# Patient Record
Sex: Male | Born: 1945 | Race: Black or African American | Hispanic: No | Smoking: Former smoker
Health system: Southern US, Community
[De-identification: ages and names within clinical notes are randomized; demographics above are authoritative.]

## PROBLEM LIST (undated history)

## (undated) DIAGNOSIS — E785 Hyperlipidemia, unspecified: Secondary | ICD-10-CM

## (undated) DIAGNOSIS — M1990 Unspecified osteoarthritis, unspecified site: Secondary | ICD-10-CM

## (undated) DIAGNOSIS — I10 Essential (primary) hypertension: Secondary | ICD-10-CM

## (undated) DIAGNOSIS — E1169 Type 2 diabetes mellitus with other specified complication: Secondary | ICD-10-CM

## (undated) DIAGNOSIS — E119 Type 2 diabetes mellitus without complications: Secondary | ICD-10-CM

## (undated) HISTORY — DX: Hyperlipidemia, unspecified: E78.5

## (undated) HISTORY — DX: Unspecified osteoarthritis, unspecified site: M19.90

## (undated) HISTORY — PX: CYST REMOVAL TRUNK: SHX6283

## (undated) HISTORY — PX: APPENDECTOMY: SHX54

## (undated) HISTORY — DX: Essential (primary) hypertension: I10

## (undated) HISTORY — DX: Type 2 diabetes mellitus without complications: E11.9

## (undated) HISTORY — DX: Type 2 diabetes mellitus with other specified complication: E11.69

---

## 2007-04-27 HISTORY — PX: SHOULDER SURGERY: SHX246

## 2010-07-19 ENCOUNTER — Emergency Department: Payer: Self-pay | Admitting: Emergency Medicine

## 2012-08-01 ENCOUNTER — Emergency Department: Payer: Self-pay | Admitting: Emergency Medicine

## 2013-05-11 ENCOUNTER — Emergency Department: Payer: Self-pay | Admitting: Emergency Medicine

## 2015-04-01 ENCOUNTER — Ambulatory Visit (INDEPENDENT_AMBULATORY_CARE_PROVIDER_SITE_OTHER): Payer: Medicare Other | Admitting: Family Medicine

## 2015-04-01 ENCOUNTER — Encounter: Payer: Self-pay | Admitting: Family Medicine

## 2015-04-01 VITALS — BP 138/63 | HR 87 | Temp 98.5°F | Resp 16 | Ht 68.0 in | Wt 197.7 lb

## 2015-04-01 DIAGNOSIS — R058 Other specified cough: Secondary | ICD-10-CM | POA: Insufficient documentation

## 2015-04-01 DIAGNOSIS — Z23 Encounter for immunization: Secondary | ICD-10-CM | POA: Diagnosis not present

## 2015-04-01 DIAGNOSIS — E1165 Type 2 diabetes mellitus with hyperglycemia: Secondary | ICD-10-CM | POA: Insufficient documentation

## 2015-04-01 DIAGNOSIS — T464X5A Adverse effect of angiotensin-converting-enzyme inhibitors, initial encounter: Secondary | ICD-10-CM

## 2015-04-01 DIAGNOSIS — IMO0002 Reserved for concepts with insufficient information to code with codable children: Secondary | ICD-10-CM | POA: Insufficient documentation

## 2015-04-01 DIAGNOSIS — E1142 Type 2 diabetes mellitus with diabetic polyneuropathy: Secondary | ICD-10-CM

## 2015-04-01 DIAGNOSIS — I10 Essential (primary) hypertension: Secondary | ICD-10-CM | POA: Insufficient documentation

## 2015-04-01 DIAGNOSIS — Z794 Long term (current) use of insulin: Secondary | ICD-10-CM

## 2015-04-01 DIAGNOSIS — E119 Type 2 diabetes mellitus without complications: Secondary | ICD-10-CM

## 2015-04-01 DIAGNOSIS — R05 Cough: Secondary | ICD-10-CM | POA: Diagnosis not present

## 2015-04-01 DIAGNOSIS — E118 Type 2 diabetes mellitus with unspecified complications: Secondary | ICD-10-CM | POA: Insufficient documentation

## 2015-04-01 LAB — GLUCOSE, POCT (MANUAL RESULT ENTRY): POC Glucose: 172 mg/dl — AB (ref 70–99)

## 2015-04-01 LAB — POCT GLYCOSYLATED HEMOGLOBIN (HGB A1C): Hemoglobin A1C: 6.3

## 2015-04-01 MED ORDER — LYRICA 150 MG PO CAPS: 150.0000 mg | ORAL_CAPSULE | Freq: Every morning | ORAL | Status: DC

## 2015-04-01 MED ORDER — LOSARTAN POTASSIUM 50 MG PO TABS: 50.0000 mg | ORAL_TABLET | Freq: Every day | ORAL | Status: DC

## 2015-04-01 MED ORDER — METOPROLOL TARTRATE 25 MG PO TABS
25.0000 mg | ORAL_TABLET | Freq: Two times a day (BID) | ORAL | Status: DC
Start: 2015-04-01 — End: 2015-07-26

## 2015-04-01 MED ORDER — TRIAMTERENE-HCTZ 37.5-25 MG PO TABS: 0.5000 | ORAL_TABLET | Freq: Every day | ORAL | Status: DC

## 2015-04-01 NOTE — Progress Notes (Signed)
Name: William Parks   MRN: [id number]    DOB: 11/27/1945   Date:04/01/2015       Progress Note  Subjective  Chief Complaint  Chief Complaint  Patient presents with  . Establish Care    NP  . Diabetes  . Hypertension    Diabetes He presents for his follow-up diabetic visit. He has type 2 diabetes mellitus. His disease course has been stable. There are no hypoglycemic associated symptoms. Pertinent negatives for hypoglycemia include no headaches. There are no diabetic associated symptoms. Pertinent negatives for diabetes include no chest pain, no fatigue, no polydipsia and no polyuria. Symptoms are stable. Diabetic complications include peripheral neuropathy (Takes Lyrica 150mg  twice daily.). Pertinent negatives for diabetic complications include no CVA or heart disease. Risk factors for coronary artery disease include diabetes mellitus. Current diabetic treatment includes intensive insulin program (Lantus 30 units BID, Humalog 10 units with meals.). He is currently taking insulin pre-breakfast, pre-lunch, pre-dinner and at bedtime. Insulin injections are given by patient. Rotation sites for injection include the abdominal wall. His weight is stable. He is following a diabetic diet. He participates in exercise daily. His breakfast blood glucose range is generally 110-130 mg/dl. An ACE inhibitor/angiotensin II receptor blocker is being taken. He does not see a podiatrist.Eye exam is not current.  Hypertension This is a chronic problem. The problem is unchanged. The problem is controlled. Pertinent negatives include no chest pain, headaches, palpitations or shortness of breath. Past treatments include beta blockers, ACE inhibitors and diuretics. The current treatment provides significant improvement. Compliance problems include medication side effects (reports having cough with lisinopril).  There is no history of kidney disease, CAD/MI or CVA.    Past Medical History  Diagnosis Date  . Diabetes  mellitus without complication (HCC)   . Hypertension   . Arthritis     rt shoulder    Past Surgical History  Procedure Laterality Date  . Cyst removal trunk    . Shoulder surgery Right     Family History  Problem Relation Age of Onset  . Cancer Mother     Breast CA    Social History   Social History  . Marital Status: Single    Spouse Name: N/A  . Number of Children: N/A  . Years of Education: N/A   Occupational History  . Not on file.   Social History Main Topics  . Smoking status: Heavy Tobacco Smoker    Types: Cigarettes  . Smokeless tobacco: Never Used  . Alcohol Use: No  . Drug Use: No  . Sexual Activity: No   Other Topics Concern  . Not on file   Social History Narrative  . No narrative on file     Current outpatient prescriptions:  .  aspirin 81 MG EC tablet, Take 81 mg by mouth daily., Disp: , Rfl: 5 .  [facility] VITAMIN D3 1000 UNITS capsule, Take 1,000 Units by mouth daily., Disp: , Rfl: 5 .  HUMALOG KWIKPEN 100 UNIT/ML KiwkPen, INJECT 10 UNITS SUBCUTANEOUSLY 3 TIMES ADAY, Disp: , Rfl: 5 .  LANTUS SOLOSTAR 100 UNIT/ML Solostar Pen, INJECT 30 UNIT(S) SUBCUTANEOUSLY TWICE A DAY, Disp: , Rfl: 0 .  lisinopril (PRINIVIL,ZESTRIL) 10 MG tablet, Take 1 tablet by mouth daily., Disp: , Rfl: 5 .  LYRICA 150 MG capsule, Take 150 mg by mouth every morning., Disp: , Rfl: 0 .  metoprolol tartrate (LOPRESSOR) 25 MG tablet, Take 25 mg by mouth 2 (two) times daily., Disp: , Rfl:  .  ranitidine (ZANTAC) 75 MG tablet, Take 75 mg by mouth 2 (two) times daily., Disp: , Rfl:  .  triamterene-hydrochlorothiazide (MAXZIDE-25) 37.5-25 MG tablet, Take 0.5 tablets by mouth daily., Disp: , Rfl: 5  No Known Allergies   Review of Systems  Constitutional: Negative for fatigue.  Respiratory: Negative for shortness of breath.   Cardiovascular: Negative for chest pain and palpitations.  Neurological: Negative for headaches.  Endo/Heme/Allergies: Negative for polydipsia.     Objective  Filed Vitals:   04/01/15 1132  BP: 138/63  Pulse: 87  Temp: 98.5 F (36.9 C)  TempSrc: Oral  Resp: 16  Height:  (1.727 m)  Weight: 197 lb 11.2 oz (89.676 kg)  SpO2: 96%    Physical Exam  Constitutional: He is oriented to person, place, and time and well-developed, well-nourished, and in no distress.  HENT:  Head: Normocephalic and atraumatic.  Eyes: Pupils are equal, round, and reactive to light.  Cardiovascular: Normal rate, regular rhythm and normal heart sounds.   Pulmonary/Chest: Effort normal and breath sounds normal. He has no wheezes.  Abdominal: Soft. Bowel sounds are normal. There is no tenderness.  Musculoskeletal: He exhibits no edema.  Neurological: He is alert and oriented to person, place, and time.  Skin: Skin is warm and dry.  Psychiatric: Mood, memory, affect and judgment normal.  Nursing note and vitals reviewed.      Recent Results (from the past 2160 hour(s))  POCT Glucose (CBG)     Status: Abnormal   Collection Time: 04/01/15 11:51 AM  Result Value Ref Range   POC Glucose 172 (A) 70 - 99 mg/dl  POCT HgB W1U     Status: Normal   Collection Time: 04/01/15 12:14 PM  Result Value Ref Range   Hemoglobin A1C 6.3      Assessment & Plan  1. Need for immunization against influenza  - Flu vaccine HIGH DOSE PF (Fluzone High dose)  2. Essential hypertension Replace lisinopril with losartan 50 mg daily. - losartan (COZAAR) 50 MG tablet; Take 1 tablet (50 mg total) by mouth daily.  Dispense: 30 tablet; Refill: 0 - metoprolol tartrate (LOPRESSOR) 25 MG tablet; Take 1 tablet (25 mg total) by mouth 2 (two) times daily.  Dispense: 180 tablet; Refill: 0 - triamterene-hydrochlorothiazide (MAXZIDE-25) 37.5-25 MG tablet; Take 0.5 tablets by mouth daily.  Dispense: 90 tablet; Refill: 0  3. Insulin dependent type 2 diabetes mellitus, controlled (HCC) A1c of 6.3%, controlled. Continue on basal and mealtime insulin. Request records from  previous PCP. Referral to ophthalmology. - POCT HgB A1C - POCT Glucose (CBG) - Lipid Profile - Comprehensive Metabolic Panel (CMET) - Ambulatory referral to Ophthalmology  4. Cough due to ACE inhibitor Replace lisinopril with losartan and follow-up in one month. - losartan (COZAAR) 50 MG tablet; Take 1 tablet (50 mg total) by mouth daily.  Dispense: 30 tablet; Refill: 0  5. Diabetic peripheral neuropathy associated with type 2 diabetes mellitus (HCC) Stable. Continue present medication. - LYRICA 150 MG capsule; Take 1 capsule (150 mg total) by mouth every morning.  Dispense: 90 capsule; Refill: 0    Atavia Poppe [facility]ad [facility] 04/01/2015 12:14 PM

## 2015-04-02 LAB — LIPID PANEL
Chol/HDL Ratio: 3.6 ratio units (ref 0.0–5.0)
Cholesterol, Total: 190 mg/dL (ref 100–199)
HDL: 53 mg/dL (ref >39)
LDL Calculated: 115 mg/dL — ABNORMAL HIGH (ref 0–99)
Triglycerides: 111 mg/dL (ref 0–149)
VLDL Cholesterol Cal: 22 mg/dL (ref 5–40)

## 2015-04-02 LAB — COMPREHENSIVE METABOLIC PANEL
ALT: 19 IU/L (ref 0–44)
AST: 21 IU/L (ref 0–40)
Albumin/Globulin Ratio: 1.3 (ref 1.1–2.5)
Albumin: 4.3 g/dL (ref 3.5–4.8)
Alkaline Phosphatase: 73 IU/L (ref 39–117)
BUN/Creatinine Ratio: 15 (ref 10–22)
BUN: 16 mg/dL (ref 8–27)
Bilirubin Total: 0.6 mg/dL (ref 0.0–1.2)
CO2: 28 mmol/L (ref 18–29)
Calcium: 9.7 mg/dL (ref 8.6–10.2)
Chloride: 97 mmol/L (ref 97–106)
Creatinine, Ser: 1.09 mg/dL (ref 0.76–1.27)
GFR calc Af Amer: 79 mL/min/{1.73_m2} (ref >59)
GFR calc non Af Amer: 68 mL/min/{1.73_m2} (ref >59)
Globulin, Total: 3.4 g/dL (ref 1.5–4.5)
Glucose: 172 mg/dL — ABNORMAL HIGH (ref 65–99)
Potassium: 4.8 mmol/L (ref 3.5–5.2)
Sodium: 138 mmol/L (ref 136–144)
Total Protein: 7.7 g/dL (ref 6.0–8.5)

## 2015-04-12 ENCOUNTER — Other Ambulatory Visit: Payer: Self-pay | Admitting: Family Medicine

## 2015-04-16 ENCOUNTER — Other Ambulatory Visit: Payer: Self-pay | Admitting: Family Medicine

## 2015-04-16 MED ORDER — LANTUS SOLOSTAR 100 UNIT/ML ~~LOC~~ SOPN: PEN_INJECTOR | SUBCUTANEOUS | Status: DC

## 2015-04-16 NOTE — Telephone Encounter (Signed)
Since Dr. Velta AddisonSyed Asad Shah is out of the office, refill request was sent to Dr. Edwena FeltyAshany Sundaram for approval and submission.

## 2015-04-16 NOTE — Telephone Encounter (Signed)
Medication has been refilled and sent to [facility] 

## 2015-04-16 NOTE — Telephone Encounter (Signed)
Medication has been refilled and sent to [facility]

## 2015-04-16 NOTE — Telephone Encounter (Signed)
[facility] TO [facility] OR COME WHEN HE NEEDED HIS INSULIN AND THE PATIENT SAID THAT HE HAS ENOUGH ONLY FOR TONIGHT. PHARM IS [facility].

## 2015-04-29 DIAGNOSIS — E113313 Type 2 diabetes mellitus with moderate nonproliferative diabetic retinopathy with macular edema, bilateral: Secondary | ICD-10-CM | POA: Diagnosis not present

## 2015-05-01 ENCOUNTER — Ambulatory Visit: Payer: Medicare Other | Admitting: Family Medicine

## 2015-05-02 ENCOUNTER — Ambulatory Visit: Payer: Medicare Other | Admitting: Family Medicine

## 2015-05-13 ENCOUNTER — Ambulatory Visit: Payer: Medicare Other | Admitting: Family Medicine

## 2015-05-19 ENCOUNTER — Other Ambulatory Visit: Payer: Self-pay | Admitting: Family Medicine

## 2015-05-19 NOTE — Telephone Encounter (Signed)
I am forwarding this encounter to the designated PCP and/or their nursing staff for further management of the tasks requested. Thank you.  

## 2015-05-20 ENCOUNTER — Other Ambulatory Visit: Payer: Self-pay | Admitting: Family Medicine

## 2015-05-21 NOTE — Telephone Encounter (Signed)
Medication has been refilled and sent to [facility] 

## 2015-06-02 ENCOUNTER — Encounter: Payer: Self-pay | Admitting: Family Medicine

## 2015-06-02 ENCOUNTER — Ambulatory Visit (INDEPENDENT_AMBULATORY_CARE_PROVIDER_SITE_OTHER): Payer: Medicare Other | Admitting: Family Medicine

## 2015-06-02 VITALS — BP 140/75 | HR 92 | Temp 98.6°F | Resp 20 | Ht 68.0 in | Wt 198.6 lb

## 2015-06-02 DIAGNOSIS — E1169 Type 2 diabetes mellitus with other specified complication: Secondary | ICD-10-CM | POA: Diagnosis not present

## 2015-06-02 DIAGNOSIS — E785 Hyperlipidemia, unspecified: Secondary | ICD-10-CM | POA: Diagnosis not present

## 2015-06-02 HISTORY — DX: Type 2 diabetes mellitus with other specified complication: E11.69

## 2015-06-02 MED ORDER — ATORVASTATIN CALCIUM 10 MG PO TABS: 10.0000 mg | ORAL_TABLET | Freq: Every day | ORAL | Status: DC

## 2015-06-02 NOTE — Progress Notes (Signed)
Name: William Parks   MRN: [id number]    DOB: 28-Jul-1945   Date:06/02/2015       Progress Note  Subjective  Chief Complaint  Chief Complaint  Patient presents with  . Follow-up    1 mo  . Diabetes  . Hypertension    Hyperlipidemia This is a chronic problem. The problem is controlled. Recent lipid tests were reviewed and are normal. Exacerbating diseases include diabetes and obesity. Pertinent negatives include no chest pain or shortness of breath. He is currently on no antihyperlipidemic treatment. Risk factors for coronary artery disease include diabetes mellitus, dyslipidemia, male sex and obesity.    Past Medical History  Diagnosis Date  . Diabetes mellitus without complication (HCC)   . Hypertension   . Arthritis     rt shoulder    Past Surgical History  Procedure Laterality Date  . Cyst removal trunk    . Shoulder surgery Right     Family History  Problem Relation Age of Onset  . Cancer Mother     Breast CA    Social History   Social History  . Marital Status: Single    Spouse Name: N/A  . Number of Children: N/A  . Years of Education: N/A   Occupational History  . Not on file.   Social History Main Topics  . Smoking status: Heavy Tobacco Smoker    Types: Cigarettes  . Smokeless tobacco: Never Used  . Alcohol Use: No  . Drug Use: No  . Sexual Activity: No   Other Topics Concern  . Not on file   Social History Narrative     Current outpatient prescriptions:  .  aspirin 81 MG EC tablet, Take 81 mg by mouth daily., Disp: , Rfl: 5 .  Cholecalciferol ([facility] VITAMIN D3) 1000 UNITS capsule, Take 1 capsule (1,000 Units total) by mouth daily., Disp: 30 capsule, Rfl: 1 .  HUMALOG KWIKPEN 100 UNIT/ML KiwkPen, INJECT 10 UNITS SUBCUTANEOUSLY 3 TIMES ADAY, Disp: , Rfl: 5 .  Insulin Glargine (LANTUS SOLOSTAR) 100 UNIT/ML Solostar Pen, Inject 30 Units into the skin 2 (two) times daily., Disp: 15 mL, Rfl: 0 .  LANTUS SOLOSTAR 100 UNIT/ML Solostar Pen, INJECT 30  UNIT(S) SUBCUTANEOUSLY TWICE A DAY, Disp: 15 mL, Rfl: 0 .  lisinopril (PRINIVIL,ZESTRIL) 10 MG tablet, , Disp: , Rfl:  .  losartan (COZAAR) 50 MG tablet, Take 1 tablet (50 mg total) by mouth daily., Disp: 30 tablet, Rfl: 0 .  LYRICA 150 MG capsule, Take 1 capsule (150 mg total) by mouth every morning., Disp: 90 capsule, Rfl: 0 .  metoprolol tartrate (LOPRESSOR) 25 MG tablet, Take 1 tablet (25 mg total) by mouth 2 (two) times daily., Disp: 180 tablet, Rfl: 0 .  ranitidine (ZANTAC) 75 MG tablet, Take 75 mg by mouth 2 (two) times daily., Disp: , Rfl:  .  triamterene-hydrochlorothiazide (MAXZIDE-25) 37.5-25 MG tablet, Take 0.5 tablets by mouth daily., Disp: 90 tablet, Rfl: 0  No Known Allergies   Review of Systems  Constitutional: Negative for fever and chills.  Respiratory: Negative for shortness of breath.   Cardiovascular: Negative for chest pain.      Objective  Filed Vitals:   06/02/15 1543  BP: 140/75  Pulse: 92  Temp: 98.6 F (37 C)  TempSrc: Oral  Resp: 20  Height:  (1.727 m)  Weight: 198 lb 9.6 oz (90.084 kg)  SpO2: 94%    Physical Exam  Constitutional: He is oriented to person, place, and time and well-developed,  well-nourished, and in no distress.  HENT:  Head: Normocephalic and atraumatic.  Cardiovascular: Normal rate and regular rhythm.   Pulmonary/Chest: Effort normal and breath sounds normal.  Abdominal: Soft. Bowel sounds are normal.  Neurological: He is alert and oriented to person, place, and time.  Nursing note and vitals reviewed.   Assessment & Plan  1. Hyperlipidemia associated with type 2 diabetes mellitus (HCC) We will start on Lipitor 10 mg at bedtime for primary prevention of CVD and for treatment of elevated LDL. Follow-up in one month. - atorvastatin (LIPITOR) 10 MG tablet; Take 1 tablet (10 mg total) by mouth daily at 6 PM.  Dispense: 90 tablet; Refill: 3   [name] [facility] 06/02/2015 4:06 PM

## 2015-06-09 DIAGNOSIS — E113313 Type 2 diabetes mellitus with moderate nonproliferative diabetic retinopathy with macular edema, bilateral: Secondary | ICD-10-CM | POA: Diagnosis not present

## 2015-06-18 ENCOUNTER — Other Ambulatory Visit: Payer: Self-pay | Admitting: Family Medicine

## 2015-06-25 ENCOUNTER — Telehealth: Payer: Self-pay

## 2015-06-25 ENCOUNTER — Telehealth: Payer: Self-pay | Admitting: Family Medicine

## 2015-06-25 MED ORDER — INSULIN GLARGINE 100 UNIT/ML SOLOSTAR PEN: 30.0000 [IU] | PEN_INJECTOR | Freq: Two times a day (BID) | SUBCUTANEOUS | Status: DC

## 2015-06-25 NOTE — Telephone Encounter (Signed)
Prescription is ready for patient pick up per Dr. Shah 

## 2015-06-25 NOTE — Telephone Encounter (Signed)
DR Clelia Croft WROTE RX LANTUS FOR PT IN THE OFFICE ON 06-25-15. HE IS TO COME IN NEXT WK FOR HIS REGULAR APPT. I TALKED WITH THE DRUG REP TRACI THAT HANDLES LANTUS AND SHE SAID THAT IF THE DR WOULD WRITE THE RX AND PUT ON THERE "TITRATE AS DIRECTED A TOTAL OF 2 BOXES"  THIS WOULD GET HIM 2 BOXES EACH TIME. THE PATIENT ALSO WANTS TO SEE IF HE COULD NOT GET HIS RX (ALL OF THEM) TO BE FOR A 90 DAY SUPPLY. [facility]

## 2015-06-25 NOTE — Telephone Encounter (Signed)
Patient came into office today 06/25/2015 and Dr. Sherryll Burger wrote him a prescription for the Lantus with 2 refills

## 2015-07-02 ENCOUNTER — Telehealth: Payer: Self-pay

## 2015-07-02 ENCOUNTER — Other Ambulatory Visit: Payer: Self-pay | Admitting: Family Medicine

## 2015-07-02 MED ORDER — CHOLECALCIFEROL 25 MCG (1000 UT) PO CAPS: 1000.0000 [IU] | ORAL_CAPSULE | Freq: Every day | ORAL | Status: DC

## 2015-07-02 MED ORDER — LOSARTAN POTASSIUM 50 MG PO TABS: 50.0000 mg | ORAL_TABLET | Freq: Every day | ORAL | Status: DC

## 2015-07-02 NOTE — Telephone Encounter (Signed)
Medication has been refilled and sent to [facility] 

## 2015-07-14 ENCOUNTER — Ambulatory Visit (INDEPENDENT_AMBULATORY_CARE_PROVIDER_SITE_OTHER): Payer: Medicare Other | Admitting: Family Medicine

## 2015-07-14 ENCOUNTER — Encounter: Payer: Self-pay | Admitting: Family Medicine

## 2015-07-14 VITALS — BP 118/68 | HR 80 | Temp 98.8°F | Resp 18 | Ht 68.0 in | Wt 200.0 lb

## 2015-07-14 DIAGNOSIS — I10 Essential (primary) hypertension: Secondary | ICD-10-CM | POA: Diagnosis not present

## 2015-07-14 DIAGNOSIS — E118 Type 2 diabetes mellitus with unspecified complications: Secondary | ICD-10-CM | POA: Diagnosis not present

## 2015-07-14 DIAGNOSIS — E1169 Type 2 diabetes mellitus with other specified complication: Secondary | ICD-10-CM | POA: Diagnosis not present

## 2015-07-14 DIAGNOSIS — Z794 Long term (current) use of insulin: Secondary | ICD-10-CM | POA: Diagnosis not present

## 2015-07-14 DIAGNOSIS — E785 Hyperlipidemia, unspecified: Secondary | ICD-10-CM

## 2015-07-14 LAB — GLUCOSE, POCT (MANUAL RESULT ENTRY): POC Glucose: 208 mg/dl — AB (ref 70–99)

## 2015-07-14 LAB — POCT GLYCOSYLATED HEMOGLOBIN (HGB A1C): Hemoglobin A1C: 9.2

## 2015-07-14 NOTE — Progress Notes (Signed)
Name: William Parks   MRN: [id number]    DOB: Nov 11, 1945   Date:07/14/2015       Progress Note  Subjective  Chief Complaint  Chief Complaint  Patient presents with  . Diabetes    pt here for 6 week follow up  . Hyperlipidemia    Diabetes He presents for his follow-up diabetic visit. He has type 2 diabetes mellitus. His disease course has been stable. There are no hypoglycemic associated symptoms. Pertinent negatives for diabetes include no blurred vision and no chest pain. There are no hypoglycemic complications. Diabetic complications include peripheral neuropathy. Current diabetic treatment includes intensive insulin program. His weight is stable. His breakfast blood glucose range is generally >200 mg/dl. An ACE inhibitor/angiotensin II receptor blocker is being taken. Eye exam is current.  Hyperlipidemia This is a chronic problem. The problem is controlled. Recent lipid tests were reviewed and are normal. Exacerbating diseases include diabetes. Pertinent negatives include no chest pain, leg pain, myalgias or shortness of breath. Current antihyperlipidemic treatment includes statins.  Hypertension This is a chronic problem. The problem is controlled. Pertinent negatives include no blurred vision, chest pain, palpitations or shortness of breath. Past treatments include diuretics, beta blockers and angiotensin blockers.      Past Medical History  Diagnosis Date  . Diabetes mellitus without complication (HCC)   . Hypertension   . Arthritis     rt shoulder  . Hyperlipidemia associated with type 2 diabetes mellitus (HCC) 06/02/2015    Past Surgical History  Procedure Laterality Date  . Cyst removal trunk    . Shoulder surgery Right     Family History  Problem Relation Age of Onset  . Cancer Mother     Breast CA    Social History   Social History  . Marital Status: Single    Spouse Name: N/A  . Number of Children: N/A  . Years of Education: N/A   Occupational History  .  Not on file.   Social History Main Topics  . Smoking status: Heavy Tobacco Smoker    Types: Cigarettes  . Smokeless tobacco: Never Used  . Alcohol Use: No  . Drug Use: No  . Sexual Activity: No   Other Topics Concern  . Not on file   Social History Narrative     Current outpatient prescriptions:  .  aspirin 81 MG EC tablet, Take 81 mg by mouth daily., Disp: , Rfl: 5 .  atorvastatin (LIPITOR) 10 MG tablet, Take 1 tablet (10 mg total) by mouth daily at 6 PM., Disp: 90 tablet, Rfl: 3 .  Cholecalciferol ([facility] VITAMIN D3) 1000 units capsule, Take 1 capsule (1,000 Units total) by mouth daily., Disp: 30 capsule, Rfl: 1 .  HUMALOG KWIKPEN 100 UNIT/ML KiwkPen, INJECT 10 UNITS SUBCUTANEOUSLY 3 TIMES ADAY, Disp: , Rfl: 5 .  Insulin Glargine (LANTUS SOLOSTAR) 100 UNIT/ML Solostar Pen, Inject 30 Units into the skin 2 (two) times daily., Disp: 15 mL, Rfl: 2 .  LANTUS SOLOSTAR 100 UNIT/ML Solostar Pen, INJECT 30 UNIT(S) SUBCUTANEOUSLY TWICE A DAY, Disp: 15 mL, Rfl: 0 .  losartan (COZAAR) 50 MG tablet, Take 1 tablet (50 mg total) by mouth daily., Disp: 90 tablet, Rfl: 0 .  LYRICA 150 MG capsule, Take 1 capsule (150 mg total) by mouth every morning., Disp: 90 capsule, Rfl: 0 .  metoprolol tartrate (LOPRESSOR) 25 MG tablet, Take 1 tablet (25 mg total) by mouth 2 (two) times daily., Disp: 180 tablet, Rfl: 0 .  ranitidine (ZANTAC) 75 MG  tablet, Take 75 mg by mouth 2 (two) times daily., Disp: , Rfl:  .  triamterene-hydrochlorothiazide (MAXZIDE-25) 37.5-25 MG tablet, Take 0.5 tablets by mouth daily., Disp: 90 tablet, Rfl: 0  No Known Allergies   Review of Systems  Constitutional: Negative for fever and chills.  Eyes: Negative for blurred vision.  Respiratory: Negative for shortness of breath.   Cardiovascular: Negative for chest pain and palpitations.  Gastrointestinal: Negative for abdominal pain.  Musculoskeletal: Negative for myalgias.   Objective  Filed Vitals:   07/14/15 1607  BP: 118/68   Pulse: 80  Temp: 98.8 F (37.1 C)  Resp: 18  Height: 5\' 8"  (1.727 m)  Weight: 200 lb (90.719 kg)  SpO2: 96%    Physical Exam  Constitutional: He is oriented to person, place, and time and well-developed, well-nourished, and in no distress.  HENT:  Head: Normocephalic and atraumatic.  Cardiovascular: Normal rate and regular rhythm.   Pulmonary/Chest: Effort normal and breath sounds normal.  Neurological: He is alert and oriented to person, place, and time.  Nursing note and vitals reviewed.     Assessment & Plan  1. Controlled type 2 diabetes mellitus with complication, with long-term current use of insulin (HCC) Fasting readings are still elevated, we will obtain an A1c and consider starting on a different basal insulin. Will continue on mealtime insulin. - POCT HgB A1C - POCT Glucose (CBG) - Urine Microalbumin w/creat. ratio  2. Hyperlipidemia associated with type 2 diabetes mellitus (HCC) Recheck fasting lipid profile and consider medication adjustment. - Lipid Profile - Comprehensive Metabolic Panel (CMET)  3. Essential hypertension Blood pressure stable and controlled on present therapy.    Trisha Ken Asad A. [facility] 07/14/2015 4:39 PM

## 2015-07-15 DIAGNOSIS — Z794 Long term (current) use of insulin: Secondary | ICD-10-CM | POA: Diagnosis not present

## 2015-07-15 DIAGNOSIS — E118 Type 2 diabetes mellitus with unspecified complications: Secondary | ICD-10-CM | POA: Diagnosis not present

## 2015-07-16 LAB — MICROALBUMIN / CREATININE URINE RATIO
Creatinine, Urine: 235.2 mg/dL
MICROALB/CREAT RATIO: 20.9 mg/g creat (ref 0.0–30.0)
Microalbumin, Urine: 49.1 ug/mL

## 2015-07-24 ENCOUNTER — Encounter: Payer: Self-pay | Admitting: Family Medicine

## 2015-07-24 ENCOUNTER — Ambulatory Visit (INDEPENDENT_AMBULATORY_CARE_PROVIDER_SITE_OTHER): Payer: Medicare Other | Admitting: Family Medicine

## 2015-07-24 VITALS — BP 124/70 | HR 79 | Temp 97.9°F | Resp 14 | Ht 68.0 in | Wt 200.7 lb

## 2015-07-24 DIAGNOSIS — IMO0002 Reserved for concepts with insufficient information to code with codable children: Secondary | ICD-10-CM

## 2015-07-24 DIAGNOSIS — E1142 Type 2 diabetes mellitus with diabetic polyneuropathy: Secondary | ICD-10-CM

## 2015-07-24 DIAGNOSIS — E1165 Type 2 diabetes mellitus with hyperglycemia: Secondary | ICD-10-CM | POA: Diagnosis not present

## 2015-07-24 MED ORDER — EMPAGLIFLOZIN 10 MG PO TABS: 10.0000 mg | ORAL_TABLET | Freq: Every day | ORAL | Status: DC

## 2015-07-24 NOTE — Progress Notes (Signed)
Name: William Parks   MRN: [id number]    DOB: [birth year]   Date:07/24/2015       Progress Note  Subjective  Chief Complaint  Chief Complaint  Patient presents with  . Abnormal Lab    A1c    HPI  Diabetes Mellitus: Pt. returns for medication adjustment for poorly controlled Diabetes. A1c increased from 6.3% to 9.2% over the last 3 months. He is taking Lantus 30 units twice daily and Humalog 10 units three times before meals. Patient's daughter, who accompanies him to today's visit, reports that patient does like eating rice mouth which is possibly contributing to increased A1c.   Past Medical History  Diagnosis Date  . Diabetes mellitus without complication (HCC)   . Hypertension   . Arthritis     rt shoulder  . Hyperlipidemia associated with type 2 diabetes mellitus (HCC) 06/02/2015    Past Surgical History  Procedure Laterality Date  . Cyst removal trunk    . Shoulder surgery Right     Family History  Problem Relation Age of Onset  . Cancer Mother     Breast CA    Social History   Social History  . Marital Status: Single    Spouse Name: N/A  . Number of Children: N/A  . Years of Education: N/A   Occupational History  . Not on file.   Social History Main Topics  . Smoking status: Heavy Tobacco Smoker    Types: Cigarettes  . Smokeless tobacco: Never Used  . Alcohol Use: No  . Drug Use: No  . Sexual Activity: No   Other Topics Concern  . Not on file   Social History Narrative     Current outpatient prescriptions:  .  aspirin 81 MG EC tablet, Take 81 mg by mouth daily., Disp: , Rfl: 5 .  atorvastatin (LIPITOR) 10 MG tablet, Take 1 tablet (10 mg total) by mouth daily at 6 PM., Disp: 90 tablet, Rfl: 3 .  Cholecalciferol ([facility]S VITAMIN D3) 1000 units capsule, Take 1 capsule (1,000 Units total) by mouth daily., Disp: 30 capsule, Rfl: 1 .  HUMALOG KWIKPEN 100 UNIT/ML KiwkPen, INJECT 10 UNITS SUBCUTANEOUSLY 3 TIMES ADAY, Disp: , Rfl: 5 .  Insulin Glargine  (LANTUS SOLOSTAR) 100 UNIT/ML Solostar Pen, Inject 30 Units into the skin 2 (two) times daily., Disp: 15 mL, Rfl: 2 .  LANTUS SOLOSTAR 100 UNIT/ML Solostar Pen, INJECT 30 UNIT(S) SUBCUTANEOUSLY TWICE A DAY, Disp: 15 mL, Rfl: 0 .  losartan (COZAAR) 50 MG tablet, Take 1 tablet (50 mg total) by mouth daily., Disp: 90 tablet, Rfl: 0 .  LYRICA 150 MG capsule, Take 1 capsule (150 mg total) by mouth every morning., Disp: 90 capsule, Rfl: 0 .  metoprolol tartrate (LOPRESSOR) 25 MG tablet, Take 1 tablet (25 mg total) by mouth 2 (two) times daily., Disp: 180 tablet, Rfl: 0 .  ranitidine (ZANTAC) 75 MG tablet, Take 75 mg by mouth 2 (two) times daily., Disp: , Rfl:  .  triamterene-hydrochlorothiazide (MAXZIDE-25) 37.5-25 MG tablet, Take 0.5 tablets by mouth daily., Disp: 90 tablet, Rfl: 0  No Known Allergies   Review of Systems  Constitutional: Negative for malaise/fatigue.  Gastrointestinal: Negative for nausea, vomiting and abdominal pain.     Objective  Filed Vitals:   07/24/15 1031  BP: 124/70  Pulse: 79  Temp: 97.9 F (36.6 C)  TempSrc: Oral  Resp: 14  Height:  (1.727 m)  Weight: 200 lb 11.2 oz (91.037 kg)  SpO2: 95%  Physical Exam  Constitutional: He is oriented to person, place, and time and well-developed, well-nourished, and in no distress.  HENT:  Head: Normocephalic and atraumatic.  Cardiovascular: Normal rate and regular rhythm.   Pulmonary/Chest: Effort normal and breath sounds normal.  Abdominal: Soft. Bowel sounds are normal.  Neurological: He is alert and oriented to person, place, and time.  Nursing note and vitals reviewed.      Assessment & Plan  1. Uncontrolled type 2 diabetes mellitus with peripheral neuropathy (HCC) Patient will be started on Jardiance milligrams daily for poorly controlled diabetes, at educated and advised on possible adverse effects including genital yeast infections, dehydration, orthostatic hypotension etc. Encouraged dietary  compliance to better control his sugars. Advised to increase fluid intake while on this medication. Recheck in 3 months. - empagliflozin (JARDIANCE) 10 MG TABS tablet; Take 10 mg by mouth daily.  Dispense: 30 tablet; Refill: 2    William Parks [facility] 07/24/2015 11:14 AM

## 2015-07-26 ENCOUNTER — Other Ambulatory Visit: Payer: Self-pay | Admitting: Family Medicine

## 2015-07-28 NOTE — Telephone Encounter (Signed)
Medication has been refilled and sent to [facility] 

## 2015-08-14 ENCOUNTER — Other Ambulatory Visit: Payer: Self-pay | Admitting: Family Medicine

## 2015-08-26 ENCOUNTER — Other Ambulatory Visit: Payer: Self-pay | Admitting: Family Medicine

## 2015-08-26 ENCOUNTER — Telehealth: Payer: Self-pay | Admitting: Family Medicine

## 2015-08-26 DIAGNOSIS — E1142 Type 2 diabetes mellitus with diabetic polyneuropathy: Secondary | ICD-10-CM

## 2015-08-26 MED ORDER — LYRICA 150 MG PO CAPS: 150.0000 mg | ORAL_CAPSULE | Freq: Every morning | ORAL | Status: DC

## 2015-08-26 NOTE — Telephone Encounter (Signed)
Pt said that he is needing refill on his lyrica 150 mg to [facility] [address] [facility]. Says that he thinks it may need an [place]. Pt is out of this medication

## 2015-08-26 NOTE — Telephone Encounter (Signed)
Prescription for Lyrica is ready for pickup

## 2015-08-29 ENCOUNTER — Other Ambulatory Visit: Payer: Self-pay | Admitting: Family Medicine

## 2015-09-02 ENCOUNTER — Telehealth: Payer: Self-pay | Admitting: Family Medicine

## 2015-09-02 NOTE — Telephone Encounter (Signed)
PT IS NEEDING REFILL ON [facility] D 1000 UNITS. PHAM IS [facility]. PHARM IS TELLING HIM THAT HE NEEDS TO BE SEEN BY THE DR . HE ALREADY HAS AN APPT FOR June 21. 2017  PT IS OUT OF THIS MEDICATION SINCE MAY 1ST.

## 2015-09-03 NOTE — Telephone Encounter (Signed)
Patient will need an appointment to check his vitamin D levels for the appropriate dose of vitamin D

## 2015-10-02 ENCOUNTER — Other Ambulatory Visit: Payer: Self-pay | Admitting: Family Medicine

## 2015-10-12 ENCOUNTER — Encounter: Payer: Self-pay | Admitting: Emergency Medicine

## 2015-10-12 ENCOUNTER — Emergency Department: Payer: Medicare Other

## 2015-10-12 ENCOUNTER — Other Ambulatory Visit: Payer: Self-pay

## 2015-10-12 ENCOUNTER — Emergency Department
Admission: EM | Admit: 2015-10-12 | Discharge: 2015-10-12 | Disposition: A | Discharge status: Home / Self Care | Payer: Medicare Other | Attending: Emergency Medicine | Admitting: Emergency Medicine

## 2015-10-12 DIAGNOSIS — Z7982 Long term (current) use of aspirin: Secondary | ICD-10-CM | POA: Insufficient documentation

## 2015-10-12 DIAGNOSIS — M1990 Unspecified osteoarthritis, unspecified site: Secondary | ICD-10-CM | POA: Insufficient documentation

## 2015-10-12 DIAGNOSIS — R079 Chest pain, unspecified: Secondary | ICD-10-CM | POA: Diagnosis not present

## 2015-10-12 DIAGNOSIS — N281 Cyst of kidney, acquired: Secondary | ICD-10-CM | POA: Diagnosis not present

## 2015-10-12 DIAGNOSIS — I10 Essential (primary) hypertension: Secondary | ICD-10-CM | POA: Diagnosis not present

## 2015-10-12 DIAGNOSIS — E1169 Type 2 diabetes mellitus with other specified complication: Secondary | ICD-10-CM | POA: Insufficient documentation

## 2015-10-12 DIAGNOSIS — E785 Hyperlipidemia, unspecified: Secondary | ICD-10-CM | POA: Diagnosis not present

## 2015-10-12 DIAGNOSIS — F17210 Nicotine dependence, cigarettes, uncomplicated: Secondary | ICD-10-CM | POA: Diagnosis not present

## 2015-10-12 DIAGNOSIS — Z79899 Other long term (current) drug therapy: Secondary | ICD-10-CM | POA: Insufficient documentation

## 2015-10-12 DIAGNOSIS — R1013 Epigastric pain: Secondary | ICD-10-CM

## 2015-10-12 LAB — CBC
HCT: 45.4 % (ref 40.0–52.0)
Hemoglobin: 15 g/dL (ref 13.0–18.0)
MCH: 28.2 pg (ref 26.0–34.0)
MCHC: 32.9 g/dL (ref 32.0–36.0)
MCV: 85.6 fL (ref 80.0–100.0)
Platelets: 230 10*3/uL (ref 150–440)
RBC: 5.31 MIL/uL (ref 4.40–5.90)
RDW: 15.1 % — ABNORMAL HIGH (ref 11.5–14.5)
WBC: 4.9 10*3/uL (ref 3.8–10.6)

## 2015-10-12 LAB — BASIC METABOLIC PANEL
Anion gap: 9 (ref 5–15)
BUN: 25 mg/dL — ABNORMAL HIGH (ref 6–20)
CO2: 28 mmol/L (ref 22–32)
Calcium: 9 mg/dL (ref 8.9–10.3)
Chloride: 102 mmol/L (ref 101–111)
Creatinine, Ser: 1.1 mg/dL (ref 0.61–1.24)
GFR calc Af Amer: >60 mL/min (ref >60)
GFR calc non Af Amer: >60 mL/min (ref >60)
Glucose, Bld: 136 mg/dL — ABNORMAL HIGH (ref 65–99)
Potassium: 3.6 mmol/L (ref 3.5–5.1)
Sodium: 139 mmol/L (ref 135–145)

## 2015-10-12 LAB — TROPONIN I: Troponin I: <0.03 ng/mL (ref <0.031)

## 2015-10-12 LAB — LIPASE, BLOOD: Lipase: 16 U/L (ref 11–51)

## 2015-10-12 MED ORDER — IOPAMIDOL (ISOVUE-300) INJECTION 61%
100.0000 mL | Freq: Once | INTRAVENOUS | Status: AC | PRN
  Administered 2015-10-12: 100 mL via INTRAVENOUS

## 2015-10-12 MED ORDER — RANITIDINE HCL 150 MG PO TABS: 150.0000 mg | ORAL_TABLET | Freq: Every day | ORAL | Status: DC

## 2015-10-12 MED ORDER — DIATRIZOATE MEGLUMINE & SODIUM 66-10 % PO SOLN
15.0000 mL | Freq: Once | ORAL | Status: AC
  Administered 2015-10-12: 15 mL via ORAL

## 2015-10-12 MED ORDER — OXYCODONE-ACETAMINOPHEN 5-325 MG PO TABS: 2.0000 | ORAL_TABLET | Freq: Four times a day (QID) | ORAL | Status: DC | PRN

## 2015-10-12 MED ORDER — MORPHINE SULFATE (PF) 4 MG/ML IV SOLN
4.0000 mg | Freq: Once | INTRAVENOUS | Status: AC
  Administered 2015-10-12: 4 mg via INTRAVENOUS
  Filled 2015-10-12: qty 1

## 2015-10-12 NOTE — ED Notes (Signed)
MD at bedside to update pt

## 2015-10-12 NOTE — ED Notes (Signed)
Pt here with c/o upper abdominal pain, reports difficulty breathing. Pt reports pain as muscle pain, reports he thinks he's "got a cold in his chest".

## 2015-10-12 NOTE — Discharge Instructions (Signed)

## 2015-10-12 NOTE — ED Notes (Signed)
Patient transported to CT 

## 2015-10-12 NOTE — ED Provider Notes (Signed)
[facility] Emergency Department Provider Note        Time seen: ----------------------------------------- 3:21 PM on 10/12/2015 -----------------------------------------    I have reviewed the triage vital signs and the nursing notes.   HISTORY  Chief Complaint Abdominal Cramping    HPI William Parks is a 70 y.o. male who presents ER for epigastric pain. Patient reports difficulty breathing as well, reports the pain seems to be worse when he moves. He thinks he is got a cold in his chest although he does not have congestion or cough. He denies fevers or chills, denies vomiting or diarrhea. She's never had this pain before. Pain is been there for 2-3 days he states.   Past Medical History  Diagnosis Date  . Diabetes mellitus without complication (HCC)   . Hypertension   . Arthritis     rt shoulder  . Hyperlipidemia associated with type 2 diabetes mellitus (HCC) 06/02/2015    Patient Active Problem List   Diagnosis Date Noted  . Hyperlipidemia associated with type 2 diabetes mellitus (HCC) 06/02/2015  . Hypertension 04/01/2015  . Uncontrolled type 2 diabetes mellitus with peripheral neuropathy (HCC) 04/01/2015    Past Surgical History  Procedure Laterality Date  . Cyst removal trunk    . Shoulder surgery Right     Allergies Review of patient's allergies indicates no known allergies.  Social History Social History  Substance Use Topics  . Smoking status: Heavy Tobacco Smoker    Types: Cigarettes  . Smokeless tobacco: Never Used  . Alcohol Use: No    Review of Systems Constitutional: Negative for fever. Eyes: Negative for visual changes. ENT: Negative for sore throat. Cardiovascular: Negative for chest pain. Respiratory: Negative for shortness of breath. Gastrointestinal: Positive for abdominal pain Genitourinary: Negative for dysuria. Musculoskeletal: Negative for back pain. Skin: Negative for rash. Neurological: Negative for  headaches, focal weakness or numbness.  10-point ROS otherwise negative.  ____________________________________________   PHYSICAL EXAM:  VITAL SIGNS: ED Triage Vitals  Enc Vitals Group     BP 10/12/15 1255 144/88 mmHg     Pulse Rate 10/12/15 1255 79     Resp 10/12/15 1255 16     Temp 10/12/15 1255 97.8 F (36.6 C)     Temp Source 10/12/15 1255 Oral     SpO2 10/12/15 1255 97 %     Weight 10/12/15 1255 200 lb (90.719 kg)     Height 10/12/15 1255 5\' 8"  (1.727 m)     Head Cir --      Peak Flow --      Pain Score 10/12/15 1255 8     Pain Loc --      Pain Edu? --      Excl. in GC? --     Constitutional: Alert and oriented. Well appearing and in no distress. Eyes: Conjunctivae are normal. PERRL. Normal extraocular movements. ENT   Head: Normocephalic and atraumatic.   Nose: No congestion/rhinnorhea.   Mouth/Throat: Mucous membranes are moist.   Neck: No stridor. Cardiovascular: Normal rate, regular rhythm. No murmurs, rubs, or gallops. Respiratory: Normal respiratory effort without tachypnea nor retractions. Breath sounds are clear and equal bilaterally. No wheezes/rales/rhonchi. Gastrointestinal: Soft and nontender. Normal bowel sounds. Patient points to epigastric area which is nontender to touch. Musculoskeletal: Nontender with normal range of motion in all extremities. No lower extremity tenderness nor edema. Neurologic:  Normal speech and language. No gross focal neurologic deficits are appreciated.  Skin:  Skin is warm, dry and intact. No  rash noted. Psychiatric: Mood and affect are normal. Speech and behavior are normal.  ____________________________________________  EKG: Interpreted by me. Normal sinus rhythm with a rate of 76 bpm, normal PR interval, normal QRS, normal QT interval. Normal axis, no evidence of acute infarction.  ____________________________________________  ED COURSE:  Pertinent labs & imaging results that were available during my care  of the patient were reviewed by me and considered in my medical decision making (see chart for details). Patient process the ER with epigastric pain of uncertain etiology. Likely GERD or peptic ulcer related. We will check basic labs and reevaluate. ____________________________________________    LABS (pertinent positives/negatives)  Labs Reviewed  BASIC METABOLIC PANEL - Abnormal; Notable for the following:    Glucose, Bld 136 (*)    BUN 25 (*)    All other components within normal limits  CBC - Abnormal; Notable for the following:    RDW 15.1 (*)    All other components within normal limits  TROPONIN I  LIPASE, BLOOD    RADIOLOGY Images were viewed by me  Acute abdominal series IMPRESSION: No acute cardiopulmonary disease. CT of abdomen and pelvis with contrast IMPRESSION: No acute findings in the abdomen/pelvis.  2.9 cm left renal cyst. 1 cm liver cyst.  Mild loss of mid to anterior vertebral body of T11 unchanged. ____________________________________________  FINAL ASSESSMENT AND PLAN  Epigastric pain  Plan: Patient with labs and imaging as dictated above. No clear etiology for his pain is identified. We'll prescribe a short supply of pain medicine and advise follow-up with his doctor in 1-2 days for recheck. I will also increase his Zantac.   Emily Filbert, MD   Note: This dictation was prepared with Dragon dictation. Any transcriptional errors that result from this process are unintentional   Emily Filbert, MD 10/12/15 1711

## 2015-10-15 ENCOUNTER — Ambulatory Visit (INDEPENDENT_AMBULATORY_CARE_PROVIDER_SITE_OTHER): Payer: Medicare Other | Admitting: Family Medicine

## 2015-10-15 ENCOUNTER — Encounter: Payer: Self-pay | Admitting: Family Medicine

## 2015-10-15 VITALS — BP 128/69 | HR 80 | Temp 98.1°F | Resp 16 | Ht 68.0 in | Wt 201.6 lb

## 2015-10-15 DIAGNOSIS — M940 Chondrocostal junction syndrome [Tietze]: Secondary | ICD-10-CM | POA: Diagnosis not present

## 2015-10-15 DIAGNOSIS — E1165 Type 2 diabetes mellitus with hyperglycemia: Secondary | ICD-10-CM

## 2015-10-15 DIAGNOSIS — E1169 Type 2 diabetes mellitus with other specified complication: Secondary | ICD-10-CM | POA: Diagnosis not present

## 2015-10-15 DIAGNOSIS — E559 Vitamin D deficiency, unspecified: Secondary | ICD-10-CM | POA: Insufficient documentation

## 2015-10-15 DIAGNOSIS — E1142 Type 2 diabetes mellitus with diabetic polyneuropathy: Secondary | ICD-10-CM | POA: Diagnosis not present

## 2015-10-15 DIAGNOSIS — I10 Essential (primary) hypertension: Secondary | ICD-10-CM | POA: Diagnosis not present

## 2015-10-15 DIAGNOSIS — IMO0002 Reserved for concepts with insufficient information to code with codable children: Secondary | ICD-10-CM

## 2015-10-15 DIAGNOSIS — E785 Hyperlipidemia, unspecified: Secondary | ICD-10-CM

## 2015-10-15 LAB — POCT GLYCOSYLATED HEMOGLOBIN (HGB A1C): Hemoglobin A1C: 7.7

## 2015-10-15 LAB — GLUCOSE, POCT (MANUAL RESULT ENTRY): POC Glucose: 144 mg/dl — AB (ref 70–99)

## 2015-10-15 MED ORDER — NAPROXEN 500 MG PO TABS: 500.0000 mg | ORAL_TABLET | Freq: Two times a day (BID) | ORAL | Status: DC

## 2015-10-15 NOTE — Progress Notes (Signed)
Name: William Parks   MRN: [id number]    DOB: 1946/03/25   Date:10/15/2015       Progress Note  Subjective  Chief Complaint  Chief Complaint  Patient presents with  . Follow-up    3 mo  . Labs Only    Vitamin D    Diabetes He presents for his follow-up diabetic visit. He has type 2 diabetes mellitus. His disease course has been improving. Pertinent negatives for hypoglycemia include no headaches. Pertinent negatives for diabetes include no chest pain, no fatigue, no polydipsia and no polyuria. Pertinent negatives for diabetic complications include no CVA. Current diabetic treatment includes intensive insulin program and oral agent (monotherapy). He participates in exercise daily. Frequency home blood tests: Not checking his sugar, glucometer is not working. An ACE inhibitor/angiotensin II receptor blocker is being taken.  Hypertension This is a chronic problem. The problem is controlled. Pertinent negatives include no chest pain, headaches, malaise/fatigue, palpitations or shortness of breath. Past treatments include diuretics and angiotensin blockers. There is no history of kidney disease, CAD/MI or CVA.  Hyperlipidemia This is a chronic problem. The problem is uncontrolled. Recent lipid tests were reviewed and are high. Pertinent negatives include no chest pain, leg pain, myalgias or shortness of breath. Current antihyperlipidemic treatment includes statins.   Chest Wall Pain;  Pt. Presents for evaluation of left and right lower chest wall pain, present for almost a week, started the day after he was outside in rain and his clothes got soaking wet. Pain is worse with taking a deep breath, bending over, and coughing. No fevers or chills. He was seen in [facility] ER last weekend and a CXR was negative (although it did show linear opacities at lung bases).   Past Medical History  Diagnosis Date  . Diabetes mellitus without complication (HCC)   . Hypertension   . Arthritis     rt shoulder  .  Hyperlipidemia associated with type 2 diabetes mellitus (HCC) 06/02/2015    Past Surgical History  Procedure Laterality Date  . Cyst removal trunk    . Shoulder surgery Right     Family History  Problem Relation Age of Onset  . Cancer Mother     Breast CA    Social History   Social History  . Marital Status: Single    Spouse Name: N/A  . Number of Children: N/A  . Years of Education: N/A   Occupational History  . Not on file.   Social History Main Topics  . Smoking status: Heavy Tobacco Smoker    Types: Cigarettes  . Smokeless tobacco: Never Used  . Alcohol Use: No  . Drug Use: No  . Sexual Activity: No   Other Topics Concern  . Not on file   Social History Narrative     Current outpatient prescriptions:  .  aspirin 81 MG EC tablet, Take 81 mg by mouth daily., Disp: , Rfl: 5 .  atorvastatin (LIPITOR) 10 MG tablet, Take 1 tablet (10 mg total) by mouth daily at 6 PM., Disp: 90 tablet, Rfl: 3 .  Cholecalciferol ([employer] VITAMIN D3) 1000 units capsule, Take 1 capsule (1,000 Units total) by mouth daily., Disp: 30 capsule, Rfl: 1 .  empagliflozin (JARDIANCE) 10 MG TABS tablet, Take 10 mg by mouth daily., Disp: 30 tablet, Rfl: 2 .  HUMALOG KWIKPEN 100 UNIT/ML KiwkPen, INJECT 10 UNITS SUBCUTANEOUSLY 3 TIMES ADAY, Disp: 3 pen, Rfl: 2 .  LANTUS SOLOSTAR 100 UNIT/ML Solostar Pen, INJECT 30 UNITS INTO THE SKIN  2 (TWO) TIMES DAILY., Disp: 5 pen, Rfl: 2 .  losartan (COZAAR) 50 MG tablet, TAKE 1 TABLET (50 MG TOTAL) BY MOUTH DAILY., Disp: 90 tablet, Rfl: 0 .  LYRICA 150 MG capsule, Take 1 capsule (150 mg total) by mouth every morning., Disp: 90 capsule, Rfl: 0 .  metoprolol tartrate (LOPRESSOR) 25 MG tablet, Take 1 tablet (25 mg total) by mouth 2 (two) times daily., Disp: 180 tablet, Rfl: 0 .  oxyCODONE-acetaminophen (PERCOCET) 5-325 MG tablet, Take 2 tablets by mouth every 6 (six) hours as needed for moderate pain or severe pain., Disp: 12 tablet, Rfl: 0 .  ranitidine (ZANTAC) 150 MG  tablet, Take 1 tablet (150 mg total) by mouth at bedtime., Disp: 30 tablet, Rfl: 1 .  triamterene-hydrochlorothiazide (MAXZIDE-25) 37.5-25 MG tablet, Take 0.5 tablets by mouth daily., Disp: 90 tablet, Rfl: 0  No Known Allergies   Review of Systems  Constitutional: Negative for malaise/fatigue and fatigue.  Respiratory: Negative for shortness of breath.   Cardiovascular: Negative for chest pain and palpitations.  Musculoskeletal: Negative for myalgias.  Neurological: Negative for headaches.  Endo/Heme/Allergies: Negative for polydipsia.    Objective  Filed Vitals:   10/15/15 1056  BP: 128/69  Pulse: 80  Temp: 98.1 F (36.7 C)  TempSrc: Oral  Resp: 16  Height: 5\' 8"  (1.727 m)  Weight: 201 lb 9.6 oz (91.445 kg)  SpO2: 95%    Physical Exam  Constitutional: He is oriented to person, place, and time and well-developed, well-nourished, and in no distress.  Cardiovascular: Normal rate, regular rhythm, S1 normal and S2 normal.   No murmur heard. Pulmonary/Chest: Effort normal. No accessory muscle usage. No respiratory distress. He has no decreased breath sounds. He has no wheezes. He has rales in the right lower field and the left lower field.  Abdominal:    Tenderness to palpation over the left antero-lateral chest wall, c/w costochondritis.   Musculoskeletal:       Right ankle: He exhibits swelling.       Left ankle: He exhibits swelling.  1+ pitting edema bilateral Lower extremities.  Neurological: He is alert and oriented to person, place, and time.  Nursing note and vitals reviewed.      Assessment & Plan  1. Uncontrolled type 2 diabetes mellitus with peripheral neuropathy (HCC) A1c is improving, down from 9.2% to 7.7%, continue on present diabetes regimen - POCT HgB A1C - POCT Glucose (CBG)  2. Hyperlipidemia associated with type 2 diabetes mellitus (HCC)  - Lipid Profile  3. Essential hypertension BP stable and controlled on present antihypertensive  therapy  4. Acute costochondritis Start on naproxen taken twice daily with meal, if patient feels naproxen therapy, we will consider obtaining CT scan of chest without contrast for further evaluation - naproxen (NAPROSYN) 500 MG tablet; Take 1 tablet (500 mg total) by mouth 2 (two) times daily with a meal.  Dispense: 14 tablet; Refill: 0  5. Vitamin D deficiency  - Vitamin D (25 hydroxy)   Lamoyne Hessel Asad A. Faylene KurtzShah [facility] 10/15/2015 1:36 PM

## 2015-10-16 ENCOUNTER — Other Ambulatory Visit: Payer: Self-pay | Admitting: Family Medicine

## 2015-10-27 ENCOUNTER — Encounter: Payer: Self-pay | Admitting: Family Medicine

## 2015-10-27 ENCOUNTER — Ambulatory Visit (INDEPENDENT_AMBULATORY_CARE_PROVIDER_SITE_OTHER): Payer: Medicare Other | Admitting: Family Medicine

## 2015-10-27 VITALS — BP 130/73 | HR 92 | Temp 98.3°F | Resp 17 | Ht 68.0 in | Wt 198.9 lb

## 2015-10-27 DIAGNOSIS — M940 Chondrocostal junction syndrome [Tietze]: Secondary | ICD-10-CM

## 2015-10-27 MED ORDER — NAPROXEN 500 MG PO TABS: 500.0000 mg | ORAL_TABLET | Freq: Two times a day (BID) | ORAL | Status: DC

## 2015-10-27 MED ORDER — TIZANIDINE HCL 2 MG PO TABS: 2.0000 mg | ORAL_TABLET | Freq: Three times a day (TID) | ORAL | Status: DC | PRN

## 2015-10-27 NOTE — Progress Notes (Signed)
Name: William Parks   MRN: [id number]    DOB: 01/28/1946   Date:10/27/2015       Progress Note  Subjective  Chief Complaint  Chief Complaint  Patient presents with  . Follow-up    1 wk    HPI  Chest Wall Pain: Pt presents for left sided chest wall pain, present for over 2 weeks, started after he lifted some cases of water and brought them into he house and got wet in the rain. He was seen last week and diagnosed with costochondritis. Started on high-dose NSAID therapy. Pt. reports pain is improved, no exacerbating factors. No new or associated symptoms at this time   Past Medical History  Diagnosis Date  . Diabetes mellitus without complication (HCC)   . Hypertension   . Arthritis     rt shoulder  . Hyperlipidemia associated with type 2 diabetes mellitus (HCC) 06/02/2015    Past Surgical History  Procedure Laterality Date  . Cyst removal trunk    . Shoulder surgery Right     Family History  Problem Relation Age of Onset  . Cancer Mother     Breast CA    Social History   Social History  . Marital Status: Single    Spouse Name: N/A  . Number of Children: N/A  . Years of Education: N/A   Occupational History  . Not on file.   Social History Main Topics  . Smoking status: Heavy Tobacco Smoker    Types: Cigarettes  . Smokeless tobacco: Never Used  . Alcohol Use: No  . Drug Use: No  . Sexual Activity: No   Other Topics Concern  . Not on file   Social History Narrative     Current outpatient prescriptions:  .  aspirin 81 MG EC tablet, Take 81 mg by mouth daily., Disp: , Rfl: 5 .  atorvastatin (LIPITOR) 10 MG tablet, Take 1 tablet (10 mg total) by mouth daily at 6 PM., Disp: 90 tablet, Rfl: 3 .  Cholecalciferol ([employer]S VITAMIN D3) 1000 units capsule, Take 1 capsule (1,000 Units total) by mouth daily., Disp: 30 capsule, Rfl: 1 .  HUMALOG KWIKPEN 100 UNIT/ML KiwkPen, INJECT 10 UNITS SUBCUTANEOUSLY 3 TIMES ADAY, Disp: 3 pen, Rfl: 2 .  JARDIANCE 10 MG TABS tablet,  TAKE 10 MG BY MOUTH DAILY., Disp: 30 tablet, Rfl: 2 .  LANTUS SOLOSTAR 100 UNIT/ML Solostar Pen, INJECT 30 UNITS INTO THE SKIN 2 (TWO) TIMES DAILY., Disp: 5 pen, Rfl: 2 .  losartan (COZAAR) 50 MG tablet, TAKE 1 TABLET (50 MG TOTAL) BY MOUTH DAILY., Disp: 90 tablet, Rfl: 0 .  LYRICA 150 MG capsule, Take 1 capsule (150 mg total) by mouth every morning., Disp: 90 capsule, Rfl: 0 .  metoprolol tartrate (LOPRESSOR) 25 MG tablet, Take 1 tablet (25 mg total) by mouth 2 (two) times daily., Disp: 180 tablet, Rfl: 0 .  naproxen (NAPROSYN) 500 MG tablet, Take 1 tablet (500 mg total) by mouth 2 (two) times daily with a meal., Disp: 14 tablet, Rfl: 0 .  oxyCODONE-acetaminophen (PERCOCET) 5-325 MG tablet, Take 2 tablets by mouth every 6 (six) hours as needed for moderate pain or severe pain., Disp: 12 tablet, Rfl: 0 .  ranitidine (ZANTAC) 150 MG tablet, Take 1 tablet (150 mg total) by mouth at bedtime., Disp: 30 tablet, Rfl: 1 .  triamterene-hydrochlorothiazide (MAXZIDE-25) 37.5-25 MG tablet, Take 0.5 tablets by mouth daily., Disp: 90 tablet, Rfl: 0  No Known Allergies   Review of Systems  Constitutional:  Negative for fever and chills.  Respiratory: Negative for shortness of breath.   Cardiovascular: Negative for chest pain.  Gastrointestinal: Negative for nausea.  Musculoskeletal: Positive for back pain.    Objective  Filed Vitals:   10/27/15 1352  BP: 130/73  Pulse: 92  Temp: 98.3 F (36.8 C)  TempSrc: Oral  Resp: 17  Height: 5\' 8"  (1.727 m)  Weight: 198 lb 14.4 oz (90.22 kg)  SpO2: 93%    Physical Exam  Constitutional: He is well-developed, well-nourished, and in no distress.  Cardiovascular: Normal rate and regular rhythm.   No murmur heard. Pulmonary/Chest: Effort normal and breath sounds normal. He has no wheezes. He exhibits tenderness.    Abdominal:    Mild tenderness to palpation over the left lateral chest wall with associated muscle spasm  Nursing note and vitals  reviewed.     Assessment & Plan  1. Acute costochondritis Improving, increase duration of naproxen for another week, started on tizanidine for muscle spasm - naproxen (NAPROSYN) 500 MG tablet; Take 1 tablet (500 mg total) by mouth 2 (two) times daily with a meal.  Dispense: 14 tablet; Refill: 0 - tiZANidine (ZANAFLEX) 2 MG tablet; Take 1 tablet (2 mg total) by mouth every 8 (eight) hours as needed for muscle spasms.  Dispense: 21 tablet; Refill: 0 .  Karl Erway Asad A. Faylene KurtzShah [facility] 10/27/2015 1:59 PM

## 2015-10-29 ENCOUNTER — Other Ambulatory Visit: Payer: Self-pay | Admitting: Family Medicine

## 2015-11-10 ENCOUNTER — Other Ambulatory Visit: Payer: Self-pay | Admitting: Family Medicine

## 2015-11-10 ENCOUNTER — Telehealth: Payer: Self-pay | Admitting: Family Medicine

## 2015-11-10 DIAGNOSIS — I10 Essential (primary) hypertension: Secondary | ICD-10-CM

## 2015-11-10 MED ORDER — TRIAMTERENE-HCTZ 37.5-25 MG PO TABS: 0.5000 | ORAL_TABLET | Freq: Every day | ORAL | Status: DC

## 2015-11-10 NOTE — Telephone Encounter (Signed)
Refill for naproxen is not appropriate.  Refill for triamterene/HCTZ 37. 5-25 1/2 tablet daily has been sent to patient's pharmacy

## 2015-11-10 NOTE — Telephone Encounter (Signed)
PT NEEDS REFILLED TRIARMETERENE HCTZ 37.5/25 1 DAILY AND NAPROXEN 500MG  . PT NEEDS 90 DAY SUPPLY. PHARM IS [facility] [place]. PT SAYS THAT THE PHARM HAS SENT REQUEST WITH NO RESPONSE. PT HAS BEEN OUT OF HIS BLOOD PRESSURE MEDS FOR 3 DAYS AND BLOOD PRESSURE IS UP.

## 2015-11-22 ENCOUNTER — Other Ambulatory Visit: Payer: Self-pay | Admitting: Family Medicine

## 2015-11-22 DIAGNOSIS — E1142 Type 2 diabetes mellitus with diabetic polyneuropathy: Secondary | ICD-10-CM

## 2015-12-01 ENCOUNTER — Telehealth: Payer: Self-pay | Admitting: Family Medicine

## 2015-12-01 NOTE — Telephone Encounter (Signed)
Lyrica is ready for pickup. Naproxen was only prescribed for acute symptoms.

## 2015-12-02 NOTE — Telephone Encounter (Signed)
Pt.notified

## 2015-12-14 ENCOUNTER — Other Ambulatory Visit: Payer: Self-pay | Admitting: Family Medicine

## 2015-12-29 ENCOUNTER — Other Ambulatory Visit: Payer: Self-pay | Admitting: Family Medicine

## 2016-01-07 ENCOUNTER — Ambulatory Visit (INDEPENDENT_AMBULATORY_CARE_PROVIDER_SITE_OTHER): Payer: Medicare Other

## 2016-01-07 ENCOUNTER — Encounter: Payer: Self-pay | Admitting: Family Medicine

## 2016-01-07 VITALS — BP 127/73 | HR 73 | Temp 98.8°F | Resp 15 | Ht 68.0 in | Wt 202.3 lb

## 2016-01-07 DIAGNOSIS — Z23 Encounter for immunization: Secondary | ICD-10-CM

## 2016-01-09 NOTE — Progress Notes (Signed)
Changed from an office visit to a nurse visit. Administration of flu vaccine. Patient rescheduled for 01/16/2016

## 2016-01-14 ENCOUNTER — Other Ambulatory Visit: Payer: Self-pay | Admitting: Family Medicine

## 2016-01-14 NOTE — Telephone Encounter (Signed)
Medication has been refilled and sent to [facility] 

## 2016-01-16 ENCOUNTER — Ambulatory Visit: Payer: Medicare Other | Admitting: Family Medicine

## 2016-01-19 ENCOUNTER — Ambulatory Visit (INDEPENDENT_AMBULATORY_CARE_PROVIDER_SITE_OTHER): Payer: Medicare Other | Admitting: Family Medicine

## 2016-01-19 ENCOUNTER — Encounter: Payer: Self-pay | Admitting: Family Medicine

## 2016-01-19 VITALS — BP 132/80 | HR 83 | Temp 98.5°F | Resp 16 | Ht 68.0 in | Wt 201.3 lb

## 2016-01-19 DIAGNOSIS — E1169 Type 2 diabetes mellitus with other specified complication: Secondary | ICD-10-CM

## 2016-01-19 DIAGNOSIS — E1142 Type 2 diabetes mellitus with diabetic polyneuropathy: Secondary | ICD-10-CM

## 2016-01-19 DIAGNOSIS — E785 Hyperlipidemia, unspecified: Secondary | ICD-10-CM | POA: Diagnosis not present

## 2016-01-19 DIAGNOSIS — E1165 Type 2 diabetes mellitus with hyperglycemia: Secondary | ICD-10-CM | POA: Diagnosis not present

## 2016-01-19 DIAGNOSIS — I10 Essential (primary) hypertension: Secondary | ICD-10-CM | POA: Diagnosis not present

## 2016-01-19 DIAGNOSIS — IMO0002 Reserved for concepts with insufficient information to code with codable children: Secondary | ICD-10-CM

## 2016-01-19 LAB — GLUCOSE, POCT (MANUAL RESULT ENTRY): POC Glucose: 138 mg/dl — AB (ref 70–99)

## 2016-01-19 LAB — POCT GLYCOSYLATED HEMOGLOBIN (HGB A1C): Hemoglobin A1C: 7.8

## 2016-01-19 MED ORDER — EMPAGLIFLOZIN 25 MG PO TABS: 25.0000 mg | ORAL_TABLET | Freq: Every day | ORAL | 0 refills | Status: DC

## 2016-01-19 MED ORDER — METOPROLOL TARTRATE 25 MG PO TABS: 25.0000 mg | ORAL_TABLET | Freq: Two times a day (BID) | ORAL | 0 refills | Status: DC

## 2016-01-19 NOTE — Progress Notes (Signed)
Name: William Parks   MRN: [id number]    DOB: [birth year]   Date:01/19/2016       Progress Note  Subjective  Chief Complaint  Chief Complaint  Patient presents with  . Follow-up  . Diabetes    Diabetes  He presents for his follow-up diabetic visit. He has type 2 diabetes mellitus. His disease course has been improving. Pertinent negatives for hypoglycemia include no headaches. Pertinent negatives for diabetes include no blurred vision and no chest pain. Current diabetic treatment includes intensive insulin program and oral agent (dual therapy). He is following a diabetic diet. His breakfast blood glucose range is generally 140-180 mg/dl. An ACE inhibitor/angiotensin II receptor blocker is being taken.  Hyperlipidemia  This is a chronic problem. The problem is uncontrolled. Recent lipid tests were reviewed and are high. Pertinent negatives include no chest pain, leg pain, myalgias or shortness of breath. Current antihyperlipidemic treatment includes statins.  Hypertension  This is a chronic problem. The problem is unchanged. Pertinent negatives include no blurred vision, chest pain, headaches, palpitations or shortness of breath. Past treatments include diuretics, beta blockers and angiotensin blockers.    Past Medical History:  Diagnosis Date  . Arthritis    rt shoulder  . Diabetes mellitus without complication (HCC)   . Hyperlipidemia associated with type 2 diabetes mellitus (HCC) 06/02/2015  . Hypertension     Past Surgical History:  Procedure Laterality Date  . CYST REMOVAL TRUNK    . SHOULDER SURGERY Right     Family History  Problem Relation Age of Onset  . Cancer Mother     Breast CA    Social History   Social History  . Marital status: Single    Spouse name: N/A  . Number of children: N/A  . Years of education: N/A   Occupational History  . Not on file.   Social History Main Topics  . Smoking status: Heavy Tobacco Smoker    Types: Cigarettes  . Smokeless  tobacco: Never Used  . Alcohol use No  . Drug use: No  . Sexual activity: No   Other Topics Concern  . Not on file   Social History Narrative  . No narrative on file     Current Outpatient Prescriptions:  .  aspirin 81 MG EC tablet, Take 81 mg by mouth daily., Disp: , Rfl: 5 .  atorvastatin (LIPITOR) 10 MG tablet, Take 1 tablet (10 mg total) by mouth daily at 6 PM., Disp: 90 tablet, Rfl: 3 .  Cholecalciferol ([employer]S VITAMIN D3) 1000 units capsule, Take 1 capsule (1,000 Units total) by mouth daily., Disp: 30 capsule, Rfl: 1 .  empagliflozin (JARDIANCE) 10 MG TABS tablet, Take 10 mg by mouth daily., Disp: 30 tablet, Rfl: 2 .  HUMALOG KWIKPEN 100 UNIT/ML KiwkPen, INJECT 10 UNITS SUBCUTANEOUSLY 3 TIMES ADAY, Disp: 15 mL, Rfl: 2 .  LANTUS SOLOSTAR 100 UNIT/ML Solostar Pen, INJECT 30 UNITS INTO THE SKIN 2 (TWO) TIMES DAILY., Disp: 5 pen, Rfl: 2 .  LANTUS SOLOSTAR 100 UNIT/ML Solostar Pen, INJECT 30 UNITS INTO THE SKIN 2 (TWO) TIMES DAILY., Disp: 15 mL, Rfl: 5 .  losartan (COZAAR) 50 MG tablet, TAKE 1 TABLET (50 MG TOTAL) BY MOUTH DAILY., Disp: 90 tablet, Rfl: 0 .  LYRICA 150 MG capsule, TAKE ONE CAPSULE BY MOUTH EVERY MORNING, Disp: 90 capsule, Rfl: 0 .  metoprolol tartrate (LOPRESSOR) 25 MG tablet, TAKE 1 TABLET (25 MG TOTAL) BY MOUTH 2 (TWO) TIMES DAILY., Disp: 180 tablet, Rfl: 0 .  naproxen (NAPROSYN) 500 MG tablet, Take 1 tablet (500 mg total) by mouth 2 (two) times daily with a meal., Disp: 14 tablet, Rfl: 0 .  oxyCODONE-acetaminophen (PERCOCET) 5-325 MG tablet, Take 2 tablets by mouth every 6 (six) hours as needed for moderate pain or severe pain., Disp: 12 tablet, Rfl: 0 .  ranitidine (ZANTAC) 150 MG tablet, Take 1 tablet (150 mg total) by mouth at bedtime., Disp: 30 tablet, Rfl: 1 .  tiZANidine (ZANAFLEX) 2 MG tablet, Take 1 tablet (2 mg total) by mouth every 8 (eight) hours as needed for muscle spasms., Disp: 21 tablet, Rfl: 0 .  triamterene-hydrochlorothiazide (MAXZIDE-25) 37.5-25 MG  tablet, Take 0.5 tablets by mouth daily., Disp: 90 tablet, Rfl: 0  No Known Allergies   Review of Systems  Eyes: Negative for blurred vision.  Respiratory: Negative for shortness of breath.   Cardiovascular: Negative for chest pain and palpitations.  Musculoskeletal: Negative for myalgias.  Neurological: Negative for headaches.    Objective  Vitals:   01/19/16 1555  BP: 132/80  Pulse: 83  Resp: 16  Temp: 98.5 F (36.9 C)  TempSrc: Oral  SpO2: 96%  Weight: 201 lb 4.8 oz (91.3 kg)  Height: 5\' 8"  (1.727 m)    Physical Exam  Constitutional: He is oriented to person, place, and time and well-developed, well-nourished, and in no distress.  HENT:  Head: Normocephalic and atraumatic.  Cardiovascular: Normal rate, regular rhythm and normal heart sounds.   No murmur heard. Pulmonary/Chest: Effort normal and breath sounds normal.  Abdominal: Soft. Bowel sounds are normal. There is no tenderness.  Neurological: He is alert and oriented to person, place, and time.  Psychiatric: Mood, memory, affect and judgment normal.  Nursing note and vitals reviewed.     Assessment & Plan  1. Uncontrolled type 2 diabetes mellitus with peripheral neuropathy (HCC) Increase Jardiance to 25 mg daily, A1c 7.8%, improved from 9.2% from March but relatively unchanged from 7.7% from June. Encouraged physical activity, recheck A1c in 3 months. - empagliflozin (JARDIANCE) 25 MG TABS tablet; Take 25 mg by mouth daily.  Dispense: 90 tablet; Refill: 0 - POCT HgB A1C - POCT Glucose (CBG)  2. Essential hypertension  - metoprolol tartrate (LOPRESSOR) 25 MG tablet; Take 1 tablet (25 mg total) by mouth 2 (two) times daily.  Dispense: 180 tablet; Refill: 0  3. Hyperlipidemia associated with type 2 diabetes mellitus (HCC)  - Lipid Profile - COMPLETE METABOLIC PANEL WITH GFR   Sherronda Sweigert Asad A. Faylene KurtzShah [facility] 01/19/2016 4:13 PM

## 2016-01-20 LAB — COMPLETE METABOLIC PANEL WITH GFR
ALT: 24 U/L (ref 9–46)
AST: 21 U/L (ref 10–35)
Albumin: 4 g/dL (ref 3.6–5.1)
Alkaline Phosphatase: 63 U/L (ref 40–115)
BUN: 15 mg/dL (ref 7–25)
CO2: 26 mmol/L (ref 20–31)
Calcium: 9.1 mg/dL (ref 8.6–10.3)
Chloride: 103 mmol/L (ref 98–110)
Creat: 1.14 mg/dL (ref 0.70–1.18)
GFR, Est African American: 74 mL/min (ref >=60)
GFR, Est Non African American: 64 mL/min (ref >=60)
Glucose, Bld: 164 mg/dL — ABNORMAL HIGH (ref 65–99)
Potassium: 4.2 mmol/L (ref 3.5–5.3)
Sodium: 139 mmol/L (ref 135–146)
Total Bilirubin: 0.8 mg/dL (ref 0.2–1.2)
Total Protein: 6.9 g/dL (ref 6.1–8.1)

## 2016-01-20 LAB — LIPID PANEL
Cholesterol: 125 mg/dL (ref 125–200)
HDL: 54 mg/dL (ref >=40)
LDL Cholesterol: 49 mg/dL (ref <130)
Total CHOL/HDL Ratio: 2.3 Ratio (ref <=5.0)
Triglycerides: 110 mg/dL (ref <150)
VLDL: 22 mg/dL (ref <30)

## 2016-02-27 ENCOUNTER — Other Ambulatory Visit: Payer: Self-pay | Admitting: Family Medicine

## 2016-02-27 DIAGNOSIS — IMO0002 Reserved for concepts with insufficient information to code with codable children: Secondary | ICD-10-CM

## 2016-02-27 DIAGNOSIS — E1142 Type 2 diabetes mellitus with diabetic polyneuropathy: Secondary | ICD-10-CM

## 2016-02-27 DIAGNOSIS — E1165 Type 2 diabetes mellitus with hyperglycemia: Secondary | ICD-10-CM

## 2016-02-28 ENCOUNTER — Other Ambulatory Visit: Payer: Self-pay | Admitting: Family Medicine

## 2016-02-28 DIAGNOSIS — E1142 Type 2 diabetes mellitus with diabetic polyneuropathy: Secondary | ICD-10-CM

## 2016-03-01 ENCOUNTER — Telehealth: Payer: Self-pay | Admitting: Family Medicine

## 2016-03-01 NOTE — Telephone Encounter (Signed)
Please resend Jardiance to [facility]. Patient states the pharmacy does not have it. He is also wondering if you could give him a 90 day supply. Please call pt once this has been completed

## 2016-03-03 NOTE — Telephone Encounter (Signed)
Medication has been refilled and sent to Pharmacy °

## 2016-03-15 ENCOUNTER — Other Ambulatory Visit: Payer: Self-pay | Admitting: Family Medicine

## 2016-03-15 DIAGNOSIS — E785 Hyperlipidemia, unspecified: Secondary | ICD-10-CM

## 2016-03-15 DIAGNOSIS — E1142 Type 2 diabetes mellitus with diabetic polyneuropathy: Secondary | ICD-10-CM

## 2016-03-15 DIAGNOSIS — E1169 Type 2 diabetes mellitus with other specified complication: Secondary | ICD-10-CM

## 2016-04-19 ENCOUNTER — Other Ambulatory Visit: Payer: Self-pay | Admitting: Family Medicine

## 2016-04-19 DIAGNOSIS — I10 Essential (primary) hypertension: Secondary | ICD-10-CM

## 2016-04-21 ENCOUNTER — Encounter: Payer: Self-pay | Admitting: Family Medicine

## 2016-04-21 ENCOUNTER — Ambulatory Visit (INDEPENDENT_AMBULATORY_CARE_PROVIDER_SITE_OTHER): Payer: Medicare Other | Admitting: Family Medicine

## 2016-04-21 VITALS — BP 130/66 | HR 85 | Temp 98.3°F | Resp 16 | Ht 68.0 in | Wt 200.0 lb

## 2016-04-21 DIAGNOSIS — I10 Essential (primary) hypertension: Secondary | ICD-10-CM | POA: Diagnosis not present

## 2016-04-21 DIAGNOSIS — E785 Hyperlipidemia, unspecified: Secondary | ICD-10-CM | POA: Diagnosis not present

## 2016-04-21 DIAGNOSIS — E1142 Type 2 diabetes mellitus with diabetic polyneuropathy: Secondary | ICD-10-CM | POA: Diagnosis not present

## 2016-04-21 DIAGNOSIS — Z23 Encounter for immunization: Secondary | ICD-10-CM | POA: Diagnosis not present

## 2016-04-21 LAB — POCT GLYCOSYLATED HEMOGLOBIN (HGB A1C): Hemoglobin A1C: 8.4

## 2016-04-21 LAB — GLUCOSE, POCT (MANUAL RESULT ENTRY): POC Glucose: 137 mg/dl — AB (ref 70–99)

## 2016-04-21 MED ORDER — PREGABALIN 150 MG PO CAPS: 150.0000 mg | ORAL_CAPSULE | Freq: Every morning | ORAL | 0 refills | Status: DC

## 2016-04-21 MED ORDER — METOPROLOL TARTRATE 25 MG PO TABS: 25.0000 mg | ORAL_TABLET | Freq: Two times a day (BID) | ORAL | 0 refills | Status: DC

## 2016-04-21 MED ORDER — TRIAMTERENE-HCTZ 37.5-25 MG PO TABS: 0.5000 | ORAL_TABLET | Freq: Every day | ORAL | 0 refills | Status: DC

## 2016-04-21 MED ORDER — LOSARTAN POTASSIUM 50 MG PO TABS: ORAL_TABLET | ORAL | 0 refills | Status: DC

## 2016-04-21 NOTE — Progress Notes (Signed)
Name: William Parks   MRN: [id number]    DOB: 10/03/1945   Date:04/21/2016       Progress Note  Subjective  Chief Complaint  Chief Complaint  Patient presents with  . Follow-up    3 mo  . Medication Refill  . Labs Only    Fasting    Diabetes  He presents for his follow-up diabetic visit. He has type 2 diabetes mellitus. His disease course has been improving. There are no hypoglycemic associated symptoms. Pertinent negatives for hypoglycemia include no headaches. Pertinent negatives for diabetes include no blurred vision, no chest pain, no fatigue, no polydipsia and no polyuria. There are no hypoglycemic complications. Pertinent negatives for diabetic complications include no CVA. Current diabetic treatment includes intensive insulin program and oral agent (monotherapy). He is following a diabetic diet. His breakfast blood glucose range is generally 130-140 mg/dl. An ACE inhibitor/angiotensin II receptor blocker is being taken.  Hyperlipidemia  This is a chronic problem. The problem is uncontrolled. Recent lipid tests were reviewed and are normal. Pertinent negatives include no chest pain, leg pain, myalgias or shortness of breath. Current antihyperlipidemic treatment includes statins.  Hypertension  This is a chronic problem. The problem is unchanged. Pertinent negatives include no blurred vision, chest pain, headaches, palpitations or shortness of breath. Past treatments include diuretics, beta blockers and angiotensin blockers. There is no history of kidney disease, CAD/MI or CVA.    Past Medical History:  Diagnosis Date  . Arthritis    rt shoulder  . Diabetes mellitus without complication (HCC)   . Hyperlipidemia associated with type 2 diabetes mellitus (HCC) 06/02/2015  . Hypertension     Past Surgical History:  Procedure Laterality Date  . CYST REMOVAL TRUNK    . SHOULDER SURGERY Right     Family History  Problem Relation Age of Onset  . Cancer Mother     Breast CA     Social History   Social History  . Marital status: Single    Spouse name: N/A  . Number of children: N/A  . Years of education: N/A   Occupational History  . Not on file.   Social History Main Topics  . Smoking status: Heavy Tobacco Smoker    Types: Cigarettes  . Smokeless tobacco: Never Used  . Alcohol use No  . Drug use: No  . Sexual activity: No   Other Topics Concern  . Not on file   Social History Narrative  . No narrative on file     Current Outpatient Prescriptions:  .  atorvastatin (LIPITOR) 10 MG tablet, TAKE 1 TABLET (10 MG TOTAL) BY MOUTH DAILY AT 6 PM., Disp: 90 tablet, Rfl: 1 .  Cholecalciferol ([facility] VITAMIN D3) 1000 units capsule, Take 1 capsule (1,000 Units total) by mouth daily., Disp: 30 capsule, Rfl: 1 .  HUMALOG KWIKPEN 100 UNIT/ML KiwkPen, INJECT 10 UNITS SUBCUTANEOUSLY 3 TIMES ADAY, Disp: 15 mL, Rfl: 2 .  JARDIANCE 25 MG TABS tablet, TAKE 25 MG BY MOUTH DAILY., Disp: 90 tablet, Rfl: 0 .  LANTUS SOLOSTAR 100 UNIT/ML Solostar Pen, INJECT 30 UNITS INTO THE SKIN 2 (TWO) TIMES DAILY., Disp: 5 pen, Rfl: 2 .  LANTUS SOLOSTAR 100 UNIT/ML Solostar Pen, INJECT 30 UNITS INTO THE SKIN 2 (TWO) TIMES DAILY., Disp: 15 mL, Rfl: 5 .  losartan (COZAAR) 50 MG tablet, TAKE 1 TABLET (50 MG TOTAL) BY MOUTH DAILY., Disp: 90 tablet, Rfl: 0 .  LYRICA 150 MG capsule, TAKE ONE CAPSULE BY MOUTH EVERY MORNING, Disp:  90 capsule, Rfl: 0 .  metoprolol tartrate (LOPRESSOR) 25 MG tablet, Take 1 tablet (25 mg total) by mouth 2 (two) times daily., Disp: 180 tablet, Rfl: 0 .  ranitidine (ZANTAC) 150 MG tablet, Take 1 tablet (150 mg total) by mouth at bedtime., Disp: 30 tablet, Rfl: 1 .  tiZANidine (ZANAFLEX) 2 MG tablet, Take 1 tablet (2 mg total) by mouth every 8 (eight) hours as needed for muscle spasms., Disp: 21 tablet, Rfl: 0 .  triamterene-hydrochlorothiazide (MAXZIDE-25) 37.5-25 MG tablet, Take 0.5 tablets by mouth daily., Disp: 90 tablet, Rfl: 0 .  aspirin 81 MG EC tablet, Take  81 mg by mouth daily., Disp: , Rfl: 5 .  naproxen (NAPROSYN) 500 MG tablet, Take 1 tablet (500 mg total) by mouth 2 (two) times daily with a meal. (Patient not taking: Reported on 04/21/2016), Disp: 14 tablet, Rfl: 0 .  oxyCODONE-acetaminophen (PERCOCET) 5-325 MG tablet, Take 2 tablets by mouth every 6 (six) hours as needed for moderate pain or severe pain. (Patient not taking: Reported on 04/21/2016), Disp: 12 tablet, Rfl: 0  No Known Allergies   Review of Systems  Constitutional: Negative for fatigue.  Eyes: Negative for blurred vision.  Respiratory: Negative for shortness of breath.   Cardiovascular: Negative for chest pain and palpitations.  Musculoskeletal: Negative for myalgias.  Neurological: Negative for headaches.  Endo/Heme/Allergies: Negative for polydipsia.      Objective  Vitals:   04/21/16 0955  BP: 130/66  Pulse: 85  Resp: 16  Temp: 98.3 F (36.8 C)  TempSrc: Oral  SpO2: 96%  Weight: 200 lb (90.7 kg)  Height: 5\' 8"  (1.727 m)    Physical Exam  Constitutional: He is oriented to person, place, and time and well-developed, well-nourished, and in no distress.  HENT:  Head: Normocephalic and atraumatic.  Cardiovascular: Normal rate, regular rhythm and normal heart sounds.   No murmur heard. Pulmonary/Chest: Effort normal and breath sounds normal.  Abdominal: Soft. Bowel sounds are normal. There is no tenderness.  Neurological: He is alert and oriented to person, place, and time.  Psychiatric: Mood, memory, affect and judgment normal.  Nursing note and vitals reviewed.    Assessment & Plan  1. Diabetic peripheral neuropathy associated with type 2 diabetes mellitus (HCC) Point-of-care A1c is 8.4%, advised to continue on present pharmacotherapy, avoid concentrated sweets. - pregabalin (LYRICA) 150 MG capsule; Take 1 capsule (150 mg total) by mouth every morning.  Dispense: 90 capsule; Refill: 0 - POCT HgB A1C - POCT Glucose (CBG)  2. Essential  hypertension  - triamterene-hydrochlorothiazide (MAXZIDE-25) 37.5-25 MG tablet; Take 0.5 tablets by mouth daily.  Dispense: 90 tablet; Refill: 0 - metoprolol tartrate (LOPRESSOR) 25 MG tablet; Take 1 tablet (25 mg total) by mouth 2 (two) times daily.  Dispense: 180 tablet; Refill: 0 - losartan (COZAAR) 50 MG tablet; TAKE 1 TABLET (50 MG TOTAL) BY MOUTH DAILY.  Dispense: 90 tablet; Refill: 0  3. Dyslipidemia Repeat FLP in March 2018  4. Need for pneumococcal vaccination  - Pneumococcal conjugate vaccine 13-valent  Osamu Olguin Asad A. Faylene KurtzShah [facility] 04/21/2016 10:22 AM

## 2016-05-20 ENCOUNTER — Other Ambulatory Visit: Payer: Self-pay | Admitting: Family Medicine

## 2016-05-31 ENCOUNTER — Ambulatory Visit (INDEPENDENT_AMBULATORY_CARE_PROVIDER_SITE_OTHER): Payer: Medicare Other

## 2016-05-31 VITALS — BP 133/64 | HR 68 | Temp 97.4°F | Ht 68.0 in | Wt 200.0 lb

## 2016-05-31 DIAGNOSIS — Z0000 Encounter for general adult medical examination without abnormal findings: Secondary | ICD-10-CM | POA: Diagnosis not present

## 2016-05-31 NOTE — Patient Instructions (Addendum)

## 2016-05-31 NOTE — Progress Notes (Signed)
Subjective:   William Parks is a 71 y.o. male who presents for Medicare Annual/Subsequent preventive examination.  Review of Systems:  N/A  Cardiac Risk Factors include: advanced age (>5men, >56 women);diabetes mellitus;dyslipidemia;hypertension;male gender;obesity (BMI >30kg/m2);smoking/ tobacco exposure     Objective:    Vitals: BP 133/64 (BP Location: Left Arm)   Pulse 68   Temp 97.4 F (36.3 C) (Oral)   Ht 5\' 8"  (1.727 m)   Wt 200 lb (90.7 kg)   BMI 30.41 kg/m   Body mass index is 30.41 kg/m.  Tobacco History  Smoking Status  . Light Tobacco Smoker  . Types: Cigarettes  Smokeless Tobacco  . Never Used    Comment: smoking 1-2 cigarettes a day     Ready to quit: Not Answered Counseling given: Not Answered   Past Medical History:  Diagnosis Date  . Arthritis    rt shoulder  . Diabetes mellitus without complication (HCC)   . Hyperlipidemia associated with type 2 diabetes mellitus (HCC) 06/02/2015  . Hypertension    Past Surgical History:  Procedure Laterality Date  . CYST REMOVAL TRUNK    . SHOULDER SURGERY Right    Family History  Problem Relation Age of Onset  . Cancer Mother     Breast CA   History  Sexual Activity  . Sexual activity: No    Outpatient Encounter Prescriptions as of 05/31/2016  Medication Sig  . atorvastatin (LIPITOR) 10 MG tablet TAKE 1 TABLET (10 MG TOTAL) BY MOUTH DAILY AT 6 PM.  . HUMALOG KWIKPEN 100 UNIT/ML KiwkPen INJECT 10 UNITS SUBCUTANEOUSLY 3 TIMES ADAY  . JARDIANCE 25 MG TABS tablet TAKE 25 MG BY MOUTH DAILY.  Marland Kitchen LANTUS SOLOSTAR 100 UNIT/ML Solostar Pen INJECT 30 UNITS INTO THE SKIN 2 (TWO) TIMES DAILY.  Marland Kitchen losartan (COZAAR) 50 MG tablet TAKE 1 TABLET (50 MG TOTAL) BY MOUTH DAILY.  . metoprolol tartrate (LOPRESSOR) 25 MG tablet Take 1 tablet (25 mg total) by mouth 2 (two) times daily.  . pregabalin (LYRICA) 150 MG capsule Take 1 capsule (150 mg total) by mouth every morning.  . ranitidine (ZANTAC) 150 MG tablet Take 1  tablet (150 mg total) by mouth at bedtime. (Patient taking differently: Take 150 mg by mouth at bedtime. )  . triamterene-hydrochlorothiazide (MAXZIDE-25) 37.5-25 MG tablet Take 0.5 tablets by mouth daily.  Marland Kitchen aspirin 81 MG EC tablet Take 81 mg by mouth daily.  . Cholecalciferol ([facility]S VITAMIN D3) 1000 units capsule Take 1 capsule (1,000 Units total) by mouth daily. (Patient not taking: Reported on 05/31/2016)  . LANTUS SOLOSTAR 100 UNIT/ML Solostar Pen INJECT 30 UNITS INTO THE SKIN 2 (TWO) TIMES DAILY. (Patient not taking: Reported on 05/31/2016)   No facility-administered encounter medications on file as of 05/31/2016.     Activities of Daily Living In your present state of health, do you have any difficulty performing the following activities: 05/31/2016 04/21/2016  Hearing? Y N  Vision? Y Y  Difficulty concentrating or making decisions? Y N  Walking or climbing stairs? N N  Dressing or bathing? N N  Doing errands, shopping? N N  Preparing Food and eating ? N -  Using the Toilet? N -  In the past six months, have you accidently leaked urine? N -  Do you have problems with loss of bowel control? N -  Managing your Medications? N -  Managing your Finances? N -  Housekeeping or managing your Housekeeping? N -  Some recent data might be hidden  Patient Care Team: Ellyn HackSyed Asad A Shah, MD as PCP - General (Family Medicine)   Assessment:     Exercise Activities and Dietary recommendations Current Exercise Habits: The patient does not participate in regular exercise at present (yardwork only), Exercise limited by: None identified  Goals    . Increase water intake          Starting 05/31/16, I will increase my water intake to 4 glasses a day.      Fall Risk Fall Risk  05/31/2016 04/21/2016 01/19/2016 01/07/2016 10/27/2015  Falls in the past year? No No No No No  Number falls in past yr: - - - - -  Injury with Fall? - - - - -  Follow up - - - - -   Depression Screen PHQ 2/9 Scores 05/31/2016  04/21/2016 01/19/2016 01/07/2016  PHQ - 2 Score 0 0 0 0    Cognitive Function     6CIT Screen 05/31/2016  What Year? 0 points  What month? 0 points  What time? 0 points  Count back from 20 0 points  Months in reverse 0 points  Repeat phrase 2 points  Total Score 2    Immunization History  Administered Date(s) Administered  . Influenza, High Dose Seasonal PF 04/01/2015  . Influenza,inj,Quad PF,36+ Mos 01/07/2016  . Pneumococcal Conjugate-13 04/21/2016   Screening Tests Health Maintenance  Topic Date Due  . Hepatitis C Screening  06/24/2016 (Originally 05/27/1945)  . OPHTHALMOLOGY EXAM  09/24/2016 (Originally 10/01/1955)  . COLONOSCOPY  04/26/2017 (Originally 10/01/1994)  . TETANUS/TDAP  04/26/2017 (Originally 10/01/1963)  . HEMOGLOBIN A1C  10/20/2016  . FOOT EXAM  01/18/2017  . PNA vac Low Risk Adult (2 of 2 - PPSV23) 04/21/2017  . INFLUENZA VACCINE  Completed  . ZOSTAVAX  Completed      Plan:  I have personally reviewed and addressed the Medicare Annual Wellness questionnaire and have noted the following in the patient's chart:  A. Medical and social history B. Use of alcohol, tobacco or illicit drugs  C. Current medications and supplements D. Functional ability and status E.  Nutritional status F.  Physical activity G. Advance directives H. List of other physicians I.  Hospitalizations, surgeries, and ER visits in previous 12 months J.  Vitals K. Screenings such as hearing and vision if needed, cognitive and depression L. Referrals and appointments - none  In addition, I have reviewed and discussed with patient certain preventive protocols, quality metrics, and best practice recommendations. A written personalized care plan for preventive services as well as general preventive health recommendations were provided to patient.  See attached scanned questionnaire for additional information.   Signed,  Hyacinth MeekerMckenzie Meaghann Choo, LPN Nurse Health Advisor   MD Recommendations:Pt  declined Hep C screening and tetanus vaccine due to wanting to check with insurance for coverage. Follow up on this at provider wellness visit.  I, as supervising physician, have reviewed the nurse health advisor's Medicare Wellness Visit note for this patient and concur with the findings and recommendations listed above.  Signed Syed Asad A. Sherryll BurgerShah MD Attending Physician.

## 2016-06-01 ENCOUNTER — Ambulatory Visit (INDEPENDENT_AMBULATORY_CARE_PROVIDER_SITE_OTHER): Payer: Medicare Other | Admitting: Family Medicine

## 2016-06-01 ENCOUNTER — Encounter: Payer: Self-pay | Admitting: Family Medicine

## 2016-06-01 VITALS — BP 132/68 | HR 78 | Temp 98.0°F | Resp 16 | Ht 68.0 in | Wt 201.6 lb

## 2016-06-01 DIAGNOSIS — Z1159 Encounter for screening for other viral diseases: Secondary | ICD-10-CM | POA: Diagnosis not present

## 2016-06-01 DIAGNOSIS — E1165 Type 2 diabetes mellitus with hyperglycemia: Secondary | ICD-10-CM

## 2016-06-01 DIAGNOSIS — Z0000 Encounter for general adult medical examination without abnormal findings: Secondary | ICD-10-CM

## 2016-06-01 DIAGNOSIS — N429 Disorder of prostate, unspecified: Secondary | ICD-10-CM | POA: Diagnosis not present

## 2016-06-01 DIAGNOSIS — E1142 Type 2 diabetes mellitus with diabetic polyneuropathy: Secondary | ICD-10-CM

## 2016-06-01 DIAGNOSIS — Z1211 Encounter for screening for malignant neoplasm of colon: Secondary | ICD-10-CM

## 2016-06-01 DIAGNOSIS — I10 Essential (primary) hypertension: Secondary | ICD-10-CM | POA: Diagnosis not present

## 2016-06-01 DIAGNOSIS — IMO0002 Reserved for concepts with insufficient information to code with codable children: Secondary | ICD-10-CM

## 2016-06-01 DIAGNOSIS — E559 Vitamin D deficiency, unspecified: Secondary | ICD-10-CM | POA: Diagnosis not present

## 2016-06-01 LAB — CBC WITH DIFFERENTIAL/PLATELET
Basophils Absolute: 41 cells/uL (ref 0–200)
Basophils Relative: 1 %
Eosinophils Absolute: 82 cells/uL (ref 15–500)
Eosinophils Relative: 2 %
HCT: 43.6 % (ref 38.5–50.0)
Hemoglobin: 14.3 g/dL (ref 13.2–17.1)
Lymphocytes Relative: 28 %
Lymphs Abs: 1148 cells/uL (ref 850–3900)
MCH: 28.3 pg (ref 27.0–33.0)
MCHC: 32.8 g/dL (ref 32.0–36.0)
MCV: 86.2 fL (ref 80.0–100.0)
MPV: 10.4 fL (ref 7.5–12.5)
Monocytes Absolute: 410 cells/uL (ref 200–950)
Monocytes Relative: 10 %
Neutro Abs: 2419 cells/uL (ref 1500–7800)
Neutrophils Relative %: 59 %
Platelets: 221 10*3/uL (ref 140–400)
RBC: 5.06 MIL/uL (ref 4.20–5.80)
RDW: 15.3 % — ABNORMAL HIGH (ref 11.0–15.0)
WBC: 4.1 10*3/uL (ref 3.8–10.8)

## 2016-06-01 LAB — TSH: TSH: 1.76 mIU/L (ref 0.40–4.50)

## 2016-06-01 LAB — PSA: PSA: 0.4 ng/mL (ref <=4.0)

## 2016-06-01 MED ORDER — EMPAGLIFLOZIN 25 MG PO TABS: 25.0000 mg | ORAL_TABLET | Freq: Every day | ORAL | 1 refills | Status: DC

## 2016-06-01 NOTE — Progress Notes (Signed)
Name: William Parks   MRN: [id number]    DOB: [birth year]   Date:06/01/2016       Progress Note  Subjective  Chief Complaint  Chief Complaint  Patient presents with  . Annual Exam    CPE    HPI  Pt. Presents for Annual Physical Exam.  He is doing well. Never had a colonoscopy, will order Cologuard. He is overdue for a DRE and PSA.    Past Medical History:  Diagnosis Date  . Arthritis    rt shoulder  . Diabetes mellitus without complication (HCC)   . Hyperlipidemia associated with type 2 diabetes mellitus (HCC) 06/02/2015  . Hypertension     Past Surgical History:  Procedure Laterality Date  . CYST REMOVAL TRUNK    . SHOULDER SURGERY Right     Family History  Problem Relation Age of Onset  . Cancer Mother     Breast CA    Social History   Social History  . Marital status: Single    Spouse name: N/A  . Number of children: N/A  . Years of education: N/A   Occupational History  . Not on file.   Social History Main Topics  . Smoking status: Light Tobacco Smoker    Types: Cigarettes  . Smokeless tobacco: Never Used     Comment: smoking 1-2 cigarettes a day  . Alcohol use No  . Drug use: No  . Sexual activity: No   Other Topics Concern  . Not on file   Social History Narrative  . No narrative on file     Current Outpatient Prescriptions:  .  aspirin 81 MG EC tablet, Take 81 mg by mouth daily., Disp: , Rfl: 5 .  atorvastatin (LIPITOR) 10 MG tablet, TAKE 1 TABLET (10 MG TOTAL) BY MOUTH DAILY AT 6 PM., Disp: 90 tablet, Rfl: 1 .  Cholecalciferol ([facility] VITAMIN D3) 1000 units capsule, Take 1 capsule (1,000 Units total) by mouth daily., Disp: 30 capsule, Rfl: 1 .  HUMALOG KWIKPEN 100 UNIT/ML KiwkPen, INJECT 10 UNITS SUBCUTANEOUSLY 3 TIMES ADAY, Disp: 15 mL, Rfl: 2 .  JARDIANCE 25 MG TABS tablet, TAKE 25 MG BY MOUTH DAILY., Disp: 90 tablet, Rfl: 0 .  LANTUS SOLOSTAR 100 UNIT/ML Solostar Pen, INJECT 30 UNITS INTO THE SKIN 2 (TWO) TIMES DAILY., Disp: 5 pen, Rfl:  2 .  LANTUS SOLOSTAR 100 UNIT/ML Solostar Pen, INJECT 30 UNITS INTO THE SKIN 2 (TWO) TIMES DAILY., Disp: 5 pen, Rfl: 5 .  losartan (COZAAR) 50 MG tablet, TAKE 1 TABLET (50 MG TOTAL) BY MOUTH DAILY., Disp: 90 tablet, Rfl: 0 .  metoprolol tartrate (LOPRESSOR) 25 MG tablet, Take 1 tablet (25 mg total) by mouth 2 (two) times daily., Disp: 180 tablet, Rfl: 0 .  pregabalin (LYRICA) 150 MG capsule, Take 1 capsule (150 mg total) by mouth every morning., Disp: 90 capsule, Rfl: 0 .  ranitidine (ZANTAC) 150 MG tablet, Take 1 tablet (150 mg total) by mouth at bedtime. (Patient taking differently: Take 150 mg by mouth at bedtime. ), Disp: 30 tablet, Rfl: 1 .  triamterene-hydrochlorothiazide (MAXZIDE-25) 37.5-25 MG tablet, Take 0.5 tablets by mouth daily., Disp: 90 tablet, Rfl: 0  No Known Allergies   Review of Systems  Constitutional: Negative for chills, fever and malaise/fatigue.  HENT: Negative for congestion and sore throat.   Eyes: Negative for blurred vision (occasional blurred vision out of left eye, follows up with Ophthalmology) and double vision.  Respiratory: Negative for cough and shortness of breath.  Cardiovascular: Negative for chest pain and leg swelling.  Gastrointestinal: Negative for abdominal pain, blood in stool, constipation, nausea and vomiting.  Genitourinary: Negative for dysuria and hematuria.  Neurological: Negative for dizziness and headaches.  Psychiatric/Behavioral: Negative for depression. The patient is not nervous/anxious and does not have insomnia.      Objective  Vitals:   06/01/16 0955  BP: 132/68  Pulse: 78  Resp: 16  Temp: 98 F (36.7 C)  TempSrc: Oral  SpO2: 96%  Weight: 201 lb 9.6 oz (91.4 kg)  Height: 5\' 8"  (1.727 m)    Physical Exam  Constitutional: He is oriented to person, place, and time and well-developed, well-nourished, and in no distress.  HENT:  Head: Normocephalic and atraumatic.  Eyes: Conjunctivae are normal. Pupils are equal, round,  and reactive to light.  Neck: Normal range of motion. Neck supple.  Cardiovascular: Normal rate, regular rhythm and normal heart sounds.   No murmur heard. Pulmonary/Chest: Effort normal and breath sounds normal. He has no wheezes.  Abdominal: Soft. Bowel sounds are normal. There is no tenderness.  Genitourinary:  Genitourinary Comments: deferred  Musculoskeletal: He exhibits no edema.  Neurological: He is alert and oriented to person, place, and time.  Psychiatric: Mood, memory, affect and judgment normal.  Nursing note and vitals reviewed.     Assessment & Plan  1. Encounter for annual physical exam Obtain age-appropriate laboratory screening - CBC with Differential/Platelet - TSH - PSA - VITAMIN D 25 Hydroxy (Vit-D Deficiency, Fractures)  2. Need for hepatitis C screening test  - Hepatitis C antibody  3. Colon cancer screening  - Cologuard  4. Uncontrolled type 2 diabetes mellitus with peripheral neuropathy [facility](HCC) Patient doing well on Jardiance, continue and recheck A1c in 6 weeks - empagliflozin (JARDIANCE) 25 MG TABS tablet; Take 25 mg by mouth daily.  Dispense: 90 tablet; Refill: 1   Amisha Pospisil Asad A. Faylene KurtzShah [facility] 06/01/2016 10:10 AM

## 2016-06-02 LAB — VITAMIN D 25 HYDROXY (VIT D DEFICIENCY, FRACTURES): Vit D, 25-Hydroxy: 27 ng/mL — ABNORMAL LOW (ref 30–100)

## 2016-06-02 LAB — HEPATITIS C ANTIBODY: HCV Ab: NEGATIVE

## 2016-06-03 ENCOUNTER — Telehealth: Payer: Self-pay

## 2016-06-03 MED ORDER — VITAMIN D (ERGOCALCIFEROL) 1.25 MG (50000 UNIT) PO CAPS: 50000.0000 [IU] | ORAL_CAPSULE | ORAL | 0 refills | Status: DC

## 2016-06-03 NOTE — Telephone Encounter (Signed)
Patient has been notified of lab results and a prescription for vitamin D3 50,000 units take 1 capsule once a week x12 weeks has been sent to [facility] per Dr. Shah, patient has been notified and verbalized understanding  

## 2016-06-30 ENCOUNTER — Ambulatory Visit (INDEPENDENT_AMBULATORY_CARE_PROVIDER_SITE_OTHER): Payer: Medicare Other | Admitting: Family Medicine

## 2016-06-30 ENCOUNTER — Encounter: Payer: Self-pay | Admitting: Family Medicine

## 2016-06-30 VITALS — BP 134/73 | HR 85 | Temp 97.8°F | Resp 16 | Ht 68.0 in | Wt 196.1 lb

## 2016-06-30 DIAGNOSIS — E1142 Type 2 diabetes mellitus with diabetic polyneuropathy: Secondary | ICD-10-CM | POA: Diagnosis not present

## 2016-06-30 DIAGNOSIS — IMO0002 Reserved for concepts with insufficient information to code with codable children: Secondary | ICD-10-CM

## 2016-06-30 DIAGNOSIS — J069 Acute upper respiratory infection, unspecified: Secondary | ICD-10-CM | POA: Diagnosis not present

## 2016-06-30 DIAGNOSIS — E1165 Type 2 diabetes mellitus with hyperglycemia: Secondary | ICD-10-CM | POA: Diagnosis not present

## 2016-06-30 LAB — POCT GLYCOSYLATED HEMOGLOBIN (HGB A1C): Hemoglobin A1C: 8.3

## 2016-06-30 LAB — GLUCOSE, POCT (MANUAL RESULT ENTRY): POC Glucose: 90 mg/dl (ref 70–99)

## 2016-06-30 MED ORDER — AZITHROMYCIN 250 MG PO TABS: ORAL_TABLET | ORAL | 0 refills | Status: DC

## 2016-06-30 MED ORDER — BENZONATATE 100 MG PO CAPS: 100.0000 mg | ORAL_CAPSULE | Freq: Three times a day (TID) | ORAL | 0 refills | Status: DC | PRN

## 2016-06-30 NOTE — Progress Notes (Signed)
Name: William Parks   MRN: [id number]    DOB: [birth year]   Date:06/30/2016       Progress Note  Subjective  Chief Complaint  Chief Complaint  Patient presents with  . Follow-up    1 mo    Sore Throat   This is a new problem. The current episode started in the past 7 days. The problem has been unchanged. There has been no fever. Associated symptoms include coughing. Pertinent negatives include no ear discharge, shortness of breath or vomiting. He has had no exposure to strep or mono. He has tried nothing for the symptoms.     Past Medical History:  Diagnosis Date  . Arthritis    rt shoulder  . Diabetes mellitus without complication (HCC)   . Hyperlipidemia associated with type 2 diabetes mellitus (HCC) 06/02/2015  . Hypertension     Past Surgical History:  Procedure Laterality Date  . CYST REMOVAL TRUNK    . SHOULDER SURGERY Right     Family History  Problem Relation Age of Onset  . Cancer Mother     Breast CA    Social History   Social History  . Marital status: Single    Spouse name: N/A  . Number of children: N/A  . Years of education: N/A   Occupational History  . Not on file.   Social History Main Topics  . Smoking status: Light Tobacco Smoker    Types: Cigarettes  . Smokeless tobacco: Never Used     Comment: smoking 1-2 cigarettes a day  . Alcohol use No  . Drug use: No  . Sexual activity: No   Other Topics Concern  . Not on file   Social History Narrative  . No narrative on file     Current Outpatient Prescriptions:  .  aspirin 81 MG EC tablet, Take 81 mg by mouth daily., Disp: , Rfl: 5 .  atorvastatin (LIPITOR) 10 MG tablet, TAKE 1 TABLET (10 MG TOTAL) BY MOUTH DAILY AT 6 PM., Disp: 90 tablet, Rfl: 1 .  Cholecalciferol ([facility] VITAMIN D3) 1000 units capsule, Take 1 capsule (1,000 Units total) by mouth daily., Disp: 30 capsule, Rfl: 1 .  empagliflozin (JARDIANCE) 25 MG TABS tablet, Take 25 mg by mouth daily., Disp: 90 tablet, Rfl: 1 .  HUMALOG  KWIKPEN 100 UNIT/ML KiwkPen, INJECT 10 UNITS SUBCUTANEOUSLY 3 TIMES ADAY, Disp: 15 mL, Rfl: 2 .  LANTUS SOLOSTAR 100 UNIT/ML Solostar Pen, INJECT 30 UNITS INTO THE SKIN 2 (TWO) TIMES DAILY., Disp: 5 pen, Rfl: 2 .  LANTUS SOLOSTAR 100 UNIT/ML Solostar Pen, INJECT 30 UNITS INTO THE SKIN 2 (TWO) TIMES DAILY., Disp: 5 pen, Rfl: 5 .  losartan (COZAAR) 50 MG tablet, TAKE 1 TABLET (50 MG TOTAL) BY MOUTH DAILY., Disp: 90 tablet, Rfl: 0 .  metoprolol tartrate (LOPRESSOR) 25 MG tablet, Take 1 tablet (25 mg total) by mouth 2 (two) times daily., Disp: 180 tablet, Rfl: 0 .  pregabalin (LYRICA) 150 MG capsule, Take 1 capsule (150 mg total) by mouth every morning., Disp: 90 capsule, Rfl: 0 .  ranitidine (ZANTAC) 150 MG tablet, Take 1 tablet (150 mg total) by mouth at bedtime. (Patient taking differently: Take 150 mg by mouth at bedtime. ), Disp: 30 tablet, Rfl: 1 .  triamterene-hydrochlorothiazide (MAXZIDE-25) 37.5-25 MG tablet, Take 0.5 tablets by mouth daily., Disp: 90 tablet, Rfl: 0 .  Vitamin D, Ergocalciferol, (DRISDOL) 50000 units CAPS capsule, Take 1 capsule (50,000 Units total) by mouth once a week. For 12  weeks, Disp: 12 capsule, Rfl: 0  No Known Allergies   Review of Systems  HENT: Negative for ear discharge.   Respiratory: Positive for cough. Negative for shortness of breath.   Gastrointestinal: Negative for vomiting.     Objective  Vitals:   06/30/16 0931  BP: 134/73  Pulse: 85  Resp: 16  Temp: 97.8 F (36.6 C)  TempSrc: Oral  SpO2: 94%  Weight: 196 lb 1.6 oz (89 kg)  Height: 5\' 8"  (1.727 m)    Physical Exam  Constitutional: He is well-developed, well-nourished, and in no distress.  HENT:  Head: Normocephalic and atraumatic.  Right Ear: Tympanic membrane and ear canal normal.  Left Ear: Tympanic membrane and ear canal normal.  Mouth/Throat: Posterior oropharyngeal erythema present. No oropharyngeal exudate or posterior oropharyngeal edema.  Cardiovascular: Normal rate, regular  rhythm, S1 normal, S2 normal and normal heart sounds.   No murmur heard. Pulmonary/Chest: Effort normal and breath sounds normal. He has no wheezes. He has no rhonchi.  Nursing note and vitals reviewed.    Assessment & Plan  1. URI with cough and congestion We'll start on antibiotic and antitussive for cough and congestion. - azithromycin (ZITHROMAX) 250 MG tablet; 2 tabs po day 1, then 1 tab po q day x 4 days  Dispense: 6 tablet; Refill: 0 - benzonatate (TESSALON) 100 MG capsule; Take 1 capsule (100 mg total) by mouth 3 (three) times daily as needed for cough.  Dispense: 20 capsule; Refill: 0  2. Uncontrolled type 2 diabetes mellitus with peripheral neuropathy (HCC) Point-of-care A1c and glucose are not due until 07/21/2016, patient will return in 3 weeks. - POCT HgB A1C - POCT Glucose (CBG)  Caidence Higashi Asad A. Faylene KurtzShah [facility] 06/30/2016 9:51 AM

## 2016-07-12 ENCOUNTER — Other Ambulatory Visit: Payer: Self-pay | Admitting: Family Medicine

## 2016-07-23 ENCOUNTER — Ambulatory Visit (INDEPENDENT_AMBULATORY_CARE_PROVIDER_SITE_OTHER): Payer: Medicare Other | Admitting: Family Medicine

## 2016-07-23 ENCOUNTER — Encounter: Payer: Self-pay | Admitting: Family Medicine

## 2016-07-23 VITALS — BP 132/70 | HR 76 | Temp 97.9°F | Resp 16 | Ht 68.0 in | Wt 196.2 lb

## 2016-07-23 DIAGNOSIS — IMO0002 Reserved for concepts with insufficient information to code with codable children: Secondary | ICD-10-CM

## 2016-07-23 DIAGNOSIS — I10 Essential (primary) hypertension: Secondary | ICD-10-CM

## 2016-07-23 DIAGNOSIS — E785 Hyperlipidemia, unspecified: Secondary | ICD-10-CM | POA: Diagnosis not present

## 2016-07-23 DIAGNOSIS — E1165 Type 2 diabetes mellitus with hyperglycemia: Secondary | ICD-10-CM

## 2016-07-23 DIAGNOSIS — E1142 Type 2 diabetes mellitus with diabetic polyneuropathy: Secondary | ICD-10-CM | POA: Diagnosis not present

## 2016-07-23 LAB — COMPLETE METABOLIC PANEL WITH GFR
ALT: 22 U/L (ref 9–46)
AST: 19 U/L (ref 10–35)
Albumin: 3.8 g/dL (ref 3.6–5.1)
Alkaline Phosphatase: 65 U/L (ref 40–115)
BUN: 17 mg/dL (ref 7–25)
CO2: 29 mmol/L (ref 20–31)
Calcium: 9.5 mg/dL (ref 8.6–10.3)
Chloride: 103 mmol/L (ref 98–110)
Creat: 1.1 mg/dL (ref 0.70–1.18)
GFR, Est African American: 78 mL/min (ref >=60)
GFR, Est Non African American: 67 mL/min (ref >=60)
Glucose, Bld: 155 mg/dL — ABNORMAL HIGH (ref 65–99)
Potassium: 4.2 mmol/L (ref 3.5–5.3)
Sodium: 141 mmol/L (ref 135–146)
Total Bilirubin: 0.9 mg/dL (ref 0.2–1.2)
Total Protein: 7.3 g/dL (ref 6.1–8.1)

## 2016-07-23 LAB — LIPID PANEL
Cholesterol: 124 mg/dL (ref <200)
HDL: 52 mg/dL (ref >40)
LDL Cholesterol: 55 mg/dL (ref <100)
Total CHOL/HDL Ratio: 2.4 Ratio (ref <5.0)
Triglycerides: 87 mg/dL (ref <150)
VLDL: 17 mg/dL (ref <30)

## 2016-07-23 MED ORDER — LOSARTAN POTASSIUM 50 MG PO TABS: ORAL_TABLET | ORAL | 0 refills | Status: DC

## 2016-07-23 MED ORDER — PREGABALIN 150 MG PO CAPS: 150.0000 mg | ORAL_CAPSULE | Freq: Every morning | ORAL | 0 refills | Status: DC

## 2016-07-23 MED ORDER — ATORVASTATIN CALCIUM 10 MG PO TABS: 10.0000 mg | ORAL_TABLET | Freq: Every day | ORAL | 1 refills | Status: DC

## 2016-07-23 NOTE — Progress Notes (Signed)
Name: William Parks   MRN: [id number]    DOB: 1946/02/11   Date:07/23/2016       Progress Note  Subjective  Chief Complaint  Chief Complaint  Patient presents with  . Follow-up    4 wk  . Medication Refill    Diabetes  He presents for his follow-up diabetic visit. He has type 2 diabetes mellitus. His disease course has been stable. There are no hypoglycemic associated symptoms. Pertinent negatives for hypoglycemia include no headaches. Associated symptoms include polydipsia and polyuria. Pertinent negatives for diabetes include no blurred vision, no chest pain and no foot paresthesias. There are no hypoglycemic complications. Diabetic complications include peripheral neuropathy. Pertinent negatives for diabetic complications include no CVA. Current diabetic treatment includes intensive insulin program and oral agent (monotherapy). He is following a generally healthy diet. He monitors blood glucose at home 1-2 x per day. His breakfast blood glucose range is generally 140-180 mg/dl. An ACE inhibitor/angiotensin II receptor blocker is being taken. Eye exam is not current.  Hyperlipidemia  This is a chronic problem. The problem is uncontrolled. Recent lipid tests were reviewed and are normal. Exacerbating diseases include diabetes and obesity. Pertinent negatives include no chest pain, leg pain or shortness of breath. Current antihyperlipidemic treatment includes statins.  Hypertension  This is a chronic problem. The problem is unchanged. Pertinent negatives include no blurred vision, chest pain, headaches, malaise/fatigue, palpitations or shortness of breath. Past treatments include diuretics, beta blockers and angiotensin blockers. There is no history of kidney disease, CAD/MI or CVA.    Past Medical History:  Diagnosis Date  . Arthritis    rt shoulder  . Diabetes mellitus without complication (HCC)   . Hyperlipidemia associated with type 2 diabetes mellitus (HCC) 06/02/2015  . Hypertension      Past Surgical History:  Procedure Laterality Date  . CYST REMOVAL TRUNK    . SHOULDER SURGERY Right     Family History  Problem Relation Age of Onset  . Cancer Mother     Breast CA    Social History   Social History  . Marital status: Single    Spouse name: N/A  . Number of children: N/A  . Years of education: N/A   Occupational History  . Not on file.   Social History Main Topics  . Smoking status: Light Tobacco Smoker    Types: Cigarettes  . Smokeless tobacco: Never Used     Comment: smoking 1-2 cigarettes a day  . Alcohol use No  . Drug use: No  . Sexual activity: No   Other Topics Concern  . Not on file   Social History Narrative  . No narrative on file     Current Outpatient Prescriptions:  .  aspirin 81 MG EC tablet, Take 81 mg by mouth daily., Disp: , Rfl: 5 .  atorvastatin (LIPITOR) 10 MG tablet, TAKE 1 TABLET (10 MG TOTAL) BY MOUTH DAILY AT 6 PM., Disp: 90 tablet, Rfl: 1 .  Cholecalciferol ([employer] VITAMIN D3) 1000 units capsule, Take 1 capsule (1,000 Units total) by mouth daily., Disp: 30 capsule, Rfl: 1 .  empagliflozin (JARDIANCE) 25 MG TABS tablet, Take 25 mg by mouth daily., Disp: 90 tablet, Rfl: 1 .  HUMALOG KWIKPEN 100 UNIT/ML KiwkPen, INJECT 10 UNITS SUBCUTANEOUSLY 3 TIMES ADAY, Disp: 15 pen, Rfl: 2 .  LANTUS SOLOSTAR 100 UNIT/ML Solostar Pen, INJECT 30 UNITS INTO THE SKIN 2 (TWO) TIMES DAILY., Disp: 5 pen, Rfl: 2 .  LANTUS SOLOSTAR 100 UNIT/ML Solostar Pen,  INJECT 30 UNITS INTO THE SKIN 2 (TWO) TIMES DAILY., Disp: 5 pen, Rfl: 5 .  losartan (COZAAR) 50 MG tablet, TAKE 1 TABLET (50 MG TOTAL) BY MOUTH DAILY., Disp: 90 tablet, Rfl: 0 .  metoprolol tartrate (LOPRESSOR) 25 MG tablet, Take 1 tablet (25 mg total) by mouth 2 (two) times daily., Disp: 180 tablet, Rfl: 0 .  pregabalin (LYRICA) 150 MG capsule, Take 1 capsule (150 mg total) by mouth every morning., Disp: 90 capsule, Rfl: 0 .  ranitidine (ZANTAC) 150 MG tablet, Take 1 tablet (150 mg total) by  mouth at bedtime. (Patient taking differently: Take 150 mg by mouth at bedtime. ), Disp: 30 tablet, Rfl: 1 .  triamterene-hydrochlorothiazide (MAXZIDE-25) 37.5-25 MG tablet, Take 0.5 tablets by mouth daily., Disp: 90 tablet, Rfl: 0 .  Vitamin D, Ergocalciferol, (DRISDOL) 50000 units CAPS capsule, Take 1 capsule (50,000 Units total) by mouth once a week. For 12 weeks, Disp: 12 capsule, Rfl: 0 .  azithromycin (ZITHROMAX) 250 MG tablet, 2 tabs po day 1, then 1 tab po q day x 4 days (Patient not taking: Reported on 07/23/2016), Disp: 6 tablet, Rfl: 0 .  benzonatate (TESSALON) 100 MG capsule, Take 1 capsule (100 mg total) by mouth 3 (three) times daily as needed for cough. (Patient not taking: Reported on 07/23/2016), Disp: 20 capsule, Rfl: 0  No Known Allergies   Review of Systems  Constitutional: Negative for chills, fever and malaise/fatigue.  Eyes: Negative for blurred vision.  Respiratory: Negative for shortness of breath.   Cardiovascular: Negative for chest pain and palpitations.  Gastrointestinal: Negative for abdominal pain.  Neurological: Negative for headaches.  Endo/Heme/Allergies: Positive for polydipsia.     Objective  Vitals:   07/23/16 1003  BP: 132/70  Pulse: 76  Resp: 16  Temp: 97.9 F (36.6 C)  TempSrc: Oral  SpO2: 97%  Weight: 196 lb 3.2 oz (89 kg)  Height:  (1.727 m)    Physical Exam  Constitutional: He is oriented to person, place, and time and well-developed, well-nourished, and in no distress.  HENT:  Head: Normocephalic and atraumatic.  Cardiovascular: Normal rate, regular rhythm and normal heart sounds.   No murmur heard. Pulmonary/Chest: Effort normal and breath sounds normal. No respiratory distress. He has no wheezes.  Abdominal: Soft. Bowel sounds are normal. There is no tenderness.  Musculoskeletal: He exhibits no edema.  Neurological: He is alert and oriented to person, place, and time.  Psychiatric: Mood, memory, affect and judgment normal.   Nursing note and vitals reviewed.     Assessment & Plan  1. Uncontrolled type 2 diabetes mellitus with peripheral neuropathy (HCC) Point-of-care A1c is 8.3%, poorly controlled diabetes on high-dose insulin, we'll try to switch to a basal insulin-GLP-1 combination such as Soliqua. Until then, continue on insulin at present dosage - Urine Microalbumin w/creat. ratio - pregabalin (LYRICA) 150 MG capsule; Take 1 capsule (150 mg total) by mouth every morning.  Dispense: 90 capsule; Refill: 0  2. Essential hypertension BP stable on losartan 50 mg - losartan (COZAAR) 50 MG tablet; TAKE 1 TABLET (50 MG TOTAL) BY MOUTH DAILY.  Dispense: 90 tablet; Refill: 0  3. Dyslipidemia  - Lipid panel - COMPLETE METABOLIC PANEL WITH GFR - atorvastatin (LIPITOR) 10 MG tablet; Take 1 tablet (10 mg total) by mouth daily at 6 PM.  Dispense: 90 tablet; Refill: 1   Kyliegh Jester Asad [facility] 07/23/2016 10:17 AM

## 2016-07-24 LAB — MICROALBUMIN / CREATININE URINE RATIO
Creatinine, Urine: 136 mg/dL (ref 20–370)
Microalb Creat Ratio: 10 mcg/mg creat (ref <30)
Microalb, Ur: 1.4 mg/dL

## 2016-08-29 ENCOUNTER — Other Ambulatory Visit: Payer: Self-pay | Admitting: Family Medicine

## 2016-09-25 ENCOUNTER — Other Ambulatory Visit: Payer: Self-pay | Admitting: Family Medicine

## 2016-09-25 DIAGNOSIS — I10 Essential (primary) hypertension: Secondary | ICD-10-CM

## 2016-10-11 ENCOUNTER — Other Ambulatory Visit: Payer: Self-pay | Admitting: Family Medicine

## 2016-10-11 DIAGNOSIS — I10 Essential (primary) hypertension: Secondary | ICD-10-CM

## 2016-10-18 ENCOUNTER — Other Ambulatory Visit: Payer: Self-pay | Admitting: Family Medicine

## 2016-10-18 DIAGNOSIS — I10 Essential (primary) hypertension: Secondary | ICD-10-CM

## 2016-10-25 ENCOUNTER — Ambulatory Visit (INDEPENDENT_AMBULATORY_CARE_PROVIDER_SITE_OTHER): Payer: Medicare Other | Admitting: Family Medicine

## 2016-10-25 ENCOUNTER — Encounter: Payer: Self-pay | Admitting: Family Medicine

## 2016-10-25 VITALS — BP 130/78 | HR 78 | Temp 98.0°F | Resp 16 | Ht 68.0 in | Wt 197.9 lb

## 2016-10-25 DIAGNOSIS — I10 Essential (primary) hypertension: Secondary | ICD-10-CM | POA: Diagnosis not present

## 2016-10-25 DIAGNOSIS — E1165 Type 2 diabetes mellitus with hyperglycemia: Secondary | ICD-10-CM

## 2016-10-25 DIAGNOSIS — E785 Hyperlipidemia, unspecified: Secondary | ICD-10-CM | POA: Diagnosis not present

## 2016-10-25 DIAGNOSIS — E1142 Type 2 diabetes mellitus with diabetic polyneuropathy: Secondary | ICD-10-CM | POA: Diagnosis not present

## 2016-10-25 DIAGNOSIS — Z716 Tobacco abuse counseling: Secondary | ICD-10-CM | POA: Diagnosis not present

## 2016-10-25 DIAGNOSIS — IMO0002 Reserved for concepts with insufficient information to code with codable children: Secondary | ICD-10-CM

## 2016-10-25 LAB — POCT GLYCOSYLATED HEMOGLOBIN (HGB A1C): Hemoglobin A1C: 7.9

## 2016-10-25 MED ORDER — PREGABALIN 150 MG PO CAPS: 150.0000 mg | ORAL_CAPSULE | Freq: Every morning | ORAL | 0 refills | Status: DC

## 2016-10-25 NOTE — Progress Notes (Signed)
Name: William Parks   MRN: [id number]    DOB: 08/01/1945   Date:10/25/2016       Progress Note  Subjective  Chief Complaint  Chief Complaint  Patient presents with  . Follow-up    3 month  . Diabetes    Diabetes  He presents for his follow-up diabetic visit. He has type 2 diabetes mellitus. His disease course has been improving. There are no hypoglycemic associated symptoms. Pertinent negatives for hypoglycemia include no headaches. Associated symptoms include polyuria. Pertinent negatives for diabetes include no blurred vision, no chest pain, no fatigue, no foot paresthesias and no polydipsia. There are no hypoglycemic complications. Diabetic complications include peripheral neuropathy. Pertinent negatives for diabetic complications include no CVA. Current diabetic treatment includes intensive insulin program and oral agent (monotherapy). He is following a generally healthy diet. He monitors blood glucose at home 1-2 x per day. His breakfast blood glucose range is generally 140-180 mg/dl. An ACE inhibitor/angiotensin II receptor blocker is being taken. Eye exam is not current.  Hyperlipidemia  This is a chronic problem. The problem is uncontrolled. Recent lipid tests were reviewed and are normal. Exacerbating diseases include diabetes and obesity. Pertinent negatives include no chest pain, leg pain or shortness of breath. Current antihyperlipidemic treatment includes statins.  Hypertension  This is a chronic problem. The problem is unchanged. Pertinent negatives include no blurred vision, chest pain, headaches, malaise/fatigue, palpitations or shortness of breath. Past treatments include diuretics, beta blockers and angiotensin blockers. There is no history of kidney disease, CAD/MI or CVA.  Nicotine Dependence  Presents for initial visit. Symptoms are negative for cravings, fatigue and insomnia. Preferred tobacco types include cigarettes. Preferred cigarette types include filtered. Preferred  strength is regular. Preferred brands include [place]. His urge triggers include company of smokers and meal time. Risk factors do not include drinking coffee or driving.His first smoke is in the afternoon. He smokes < 1/2 a pack of cigarettes per day. He started smoking when he was >71 years old. Past treatments include nothing. William Parks is ready to quit.    Past Medical History:  Diagnosis Date  . Arthritis    rt shoulder  . Diabetes mellitus without complication (HCC)   . Hyperlipidemia associated with type 2 diabetes mellitus (HCC) 06/02/2015  . Hypertension     Past Surgical History:  Procedure Laterality Date  . CYST REMOVAL TRUNK    . SHOULDER SURGERY Right     Family History  Problem Relation Age of Onset  . Cancer Mother        Breast CA    Social History   Social History  . Marital status: Single    Spouse name: N/A  . Number of children: N/A  . Years of education: N/A   Occupational History  . Not on file.   Social History Main Topics  . Smoking status: Light Tobacco Smoker    Types: Cigarettes  . Smokeless tobacco: Never Used     Comment: smoking 1-2 cigarettes a day  . Alcohol use No  . Drug use: No  . Sexual activity: No   Other Topics Concern  . Not on file   Social History Narrative  . No narrative on file     Current Outpatient Prescriptions:  .  aspirin 81 MG EC tablet, Take 81 mg by mouth daily., Disp: , Rfl: 5 .  atorvastatin (LIPITOR) 10 MG tablet, Take 1 tablet (10 mg total) by mouth daily at 6 PM., Disp: 90 tablet, Rfl: 1 .  empagliflozin (JARDIANCE) 25 MG TABS tablet, Take 25 mg by mouth daily., Disp: 90 tablet, Rfl: 1 .  HUMALOG KWIKPEN 100 UNIT/ML KiwkPen, INJECT 10 UNITS SUBCUTANEOUSLY 3 TIMES ADAY, Disp: 15 pen, Rfl: 2 .  LANTUS SOLOSTAR 100 UNIT/ML Solostar Pen, INJECT 30 UNITS INTO THE SKIN 2 (TWO) TIMES DAILY., Disp: 5 pen, Rfl: 2 .  losartan (COZAAR) 50 MG tablet, TAKE 1 TABLET (50 MG TOTAL) BY MOUTH DAILY., Disp: 90 tablet, Rfl:  0 .  pregabalin (LYRICA) 150 MG capsule, Take 1 capsule (150 mg total) by mouth every morning., Disp: 90 capsule, Rfl: 0 .  ranitidine (ZANTAC) 150 MG tablet, Take 1 tablet (150 mg total) by mouth at bedtime. (Patient taking differently: Take 150 mg by mouth at bedtime. ), Disp: 30 tablet, Rfl: 1 .  triamterene-hydrochlorothiazide (MAXZIDE-25) 37.5-25 MG tablet, TAKE 1/2 TABLETS BY MOUTH DAILY., Disp: 90 tablet, Rfl: 0  No Known Allergies   Review of Systems  Constitutional: Negative for fatigue and malaise/fatigue.  Eyes: Negative for blurred vision.  Respiratory: Negative for shortness of breath.   Cardiovascular: Negative for chest pain and palpitations.  Neurological: Negative for headaches.  Endo/Heme/Allergies: Negative for polydipsia.  Psychiatric/Behavioral: The patient does not have insomnia.      Objective  Vitals:   10/25/16 0942  BP: 130/78  Pulse: 78  Resp: 16  Temp: 98 F (36.7 C)  TempSrc: Oral  SpO2: 92%  Weight: 197 lb 14.4 oz (89.8 kg)  Height: 5\' 8"  (1.727 m)    Physical Exam  Constitutional: He is oriented to person, place, and time and well-developed, well-nourished, and in no distress.  HENT:  Head: Normocephalic and atraumatic.  Cardiovascular: Normal rate, regular rhythm and normal heart sounds.   No murmur heard. Pulmonary/Chest: Effort normal and breath sounds normal. No respiratory distress. He has no wheezes.  Abdominal: Soft. Bowel sounds are normal. There is no tenderness.  Musculoskeletal: He exhibits no edema.  Neurological: He is alert and oriented to person, place, and time.  Psychiatric: Mood, memory, affect and judgment normal.  Nursing note and vitals reviewed.     Recent Results (from the past 2160 hour(s))  POCT HgB A1C     Status: Abnormal   Collection Time: 10/25/16  9:51 AM  Result Value Ref Range   Hemoglobin A1C 7.9      Assessment & Plan  1. Uncontrolled type 2 diabetes mellitus with peripheral neuropathy  (HCC) Point-of-care A1c 7.9%, relatively well-controlled diabetes, refill for provided - POCT HgB A1C - pregabalin (LYRICA) 150 MG capsule; Take 1 capsule (150 mg total) by mouth every morning.  Dispense: 90 capsule; Refill: 0  2. Dyslipidemia FLP from March 2018 is at goal  3. Essential hypertension BP stable on present antihypertensive therapy  4. Tobacco abuse counseling She smokes a few cigarettes every day, recommend that he quit smoking, does not require any prescription pharmacotherapy for smoking cessation at this time, may use nicotine patches if needed   [employer] [facility] 10/25/2016 10:06 AM

## 2016-11-05 ENCOUNTER — Other Ambulatory Visit: Payer: Self-pay | Admitting: Family Medicine

## 2016-11-05 DIAGNOSIS — M940 Chondrocostal junction syndrome [Tietze]: Secondary | ICD-10-CM

## 2016-11-08 ENCOUNTER — Telehealth: Payer: Self-pay | Admitting: Family Medicine

## 2016-11-08 DIAGNOSIS — M542 Cervicalgia: Principal | ICD-10-CM

## 2016-11-08 DIAGNOSIS — M25519 Pain in unspecified shoulder: Secondary | ICD-10-CM | POA: Insufficient documentation

## 2016-11-08 MED ORDER — TIZANIDINE HCL 2 MG PO TABS: 2.0000 mg | ORAL_TABLET | Freq: Three times a day (TID) | ORAL | 0 refills | Status: AC | PRN

## 2016-11-08 NOTE — Telephone Encounter (Signed)
Pt would like to know if he can get a refill on Tizanidine for his Muscle Spasms. [facility]

## 2016-11-08 NOTE — Telephone Encounter (Signed)
Patient has been experiencing pain in his left side of neck and left shoulder, going on for a few days and taking tizanidine helps, he would like a refill for tizanidine,, prescription for tizanidine 2 mg by mouth every 8 hours when necessary for 5 days sent to patient's pharmacy

## 2016-12-24 ENCOUNTER — Other Ambulatory Visit: Payer: Self-pay | Admitting: Family Medicine

## 2016-12-24 DIAGNOSIS — I10 Essential (primary) hypertension: Secondary | ICD-10-CM

## 2017-01-14 ENCOUNTER — Other Ambulatory Visit: Payer: Self-pay | Admitting: Family Medicine

## 2017-01-14 DIAGNOSIS — E785 Hyperlipidemia, unspecified: Secondary | ICD-10-CM

## 2017-01-15 ENCOUNTER — Other Ambulatory Visit: Payer: Self-pay | Admitting: Family Medicine

## 2017-01-15 DIAGNOSIS — I10 Essential (primary) hypertension: Secondary | ICD-10-CM

## 2017-01-25 ENCOUNTER — Encounter: Payer: Self-pay | Admitting: Family Medicine

## 2017-01-25 ENCOUNTER — Ambulatory Visit (INDEPENDENT_AMBULATORY_CARE_PROVIDER_SITE_OTHER): Payer: Medicare Other | Admitting: Family Medicine

## 2017-01-25 VITALS — BP 132/74 | HR 76 | Ht 68.0 in | Wt 199.6 lb

## 2017-01-25 DIAGNOSIS — E785 Hyperlipidemia, unspecified: Secondary | ICD-10-CM | POA: Diagnosis not present

## 2017-01-25 DIAGNOSIS — E1165 Type 2 diabetes mellitus with hyperglycemia: Secondary | ICD-10-CM | POA: Diagnosis not present

## 2017-01-25 DIAGNOSIS — I10 Essential (primary) hypertension: Secondary | ICD-10-CM | POA: Diagnosis not present

## 2017-01-25 DIAGNOSIS — Z23 Encounter for immunization: Secondary | ICD-10-CM

## 2017-01-25 DIAGNOSIS — E1142 Type 2 diabetes mellitus with diabetic polyneuropathy: Secondary | ICD-10-CM

## 2017-01-25 DIAGNOSIS — IMO0002 Reserved for concepts with insufficient information to code with codable children: Secondary | ICD-10-CM

## 2017-01-25 LAB — COMPLETE METABOLIC PANEL WITH GFR
AG Ratio: 1.3 (calc) (ref 1.0–2.5)
ALT: 20 U/L (ref 9–46)
AST: 18 U/L (ref 10–35)
Albumin: 3.9 g/dL (ref 3.6–5.1)
Alkaline phosphatase (APISO): 64 U/L (ref 40–115)
BUN: 18 mg/dL (ref 7–25)
CO2: 29 mmol/L (ref 20–32)
Calcium: 9.2 mg/dL (ref 8.6–10.3)
Chloride: 103 mmol/L (ref 98–110)
Creat: 1.14 mg/dL (ref 0.70–1.18)
GFR, Est African American: 74 mL/min/{1.73_m2} (ref >=60)
GFR, Est Non African American: 64 mL/min/{1.73_m2} (ref >=60)
Globulin: 3.1 g/dL (calc) (ref 1.9–3.7)
Glucose, Bld: 130 mg/dL — ABNORMAL HIGH (ref 65–99)
Potassium: 4.1 mmol/L (ref 3.5–5.3)
Sodium: 141 mmol/L (ref 135–146)
Total Bilirubin: 0.8 mg/dL (ref 0.2–1.2)
Total Protein: 7 g/dL (ref 6.1–8.1)

## 2017-01-25 LAB — LIPID PANEL
Cholesterol: 128 mg/dL (ref <200)
HDL: 53 mg/dL (ref >40)
LDL Cholesterol (Calc): 57 mg/dL (calc)
Non-HDL Cholesterol (Calc): 75 mg/dL (calc) (ref <130)
Total CHOL/HDL Ratio: 2.4 (calc) (ref <5.0)
Triglycerides: 93 mg/dL (ref <150)

## 2017-01-25 LAB — POCT GLYCOSYLATED HEMOGLOBIN (HGB A1C): Hemoglobin A1C: 7.5

## 2017-01-25 MED ORDER — EMPAGLIFLOZIN 25 MG PO TABS: 25.0000 mg | ORAL_TABLET | Freq: Every day | ORAL | 1 refills | Status: DC

## 2017-01-25 MED ORDER — TRIAMTERENE-HCTZ 37.5-25 MG PO TABS: ORAL_TABLET | ORAL | 0 refills | Status: DC

## 2017-01-25 MED ORDER — ATORVASTATIN CALCIUM 10 MG PO TABS: 10.0000 mg | ORAL_TABLET | Freq: Every day | ORAL | 1 refills | Status: DC

## 2017-01-25 MED ORDER — METOPROLOL TARTRATE 25 MG PO TABS: 25.0000 mg | ORAL_TABLET | Freq: Two times a day (BID) | ORAL | 0 refills | Status: DC

## 2017-01-25 MED ORDER — PREGABALIN 150 MG PO CAPS: 150.0000 mg | ORAL_CAPSULE | Freq: Every morning | ORAL | 0 refills | Status: DC

## 2017-01-25 NOTE — Progress Notes (Signed)
Name: William Parks   MRN: [id number]    DOB: 07-20-1945   Date:01/25/2017       Progress Note  Subjective  Chief Complaint  Chief Complaint  Patient presents with  . Diabetes    A1c today   . Immunizations    flu shot today   . Hyperlipidemia    Diabetes  He presents for his follow-up diabetic visit. He has type 2 diabetes mellitus. His disease course has been stable. There are no hypoglycemic associated symptoms. Pertinent negatives for hypoglycemia include no headaches. Associated symptoms include polydipsia and polyuria. Pertinent negatives for diabetes include no blurred vision, no chest pain and no foot paresthesias. There are no hypoglycemic complications. Diabetic complications include peripheral neuropathy. Pertinent negatives for diabetic complications include no CVA. Current diabetic treatment includes intensive insulin program. He is following a generally healthy diet. He participates in exercise daily (brisk walking every day). He monitors blood glucose at home 1-2 x per day. His breakfast blood glucose range is generally 130-140 mg/dl. An ACE inhibitor/angiotensin II receptor blocker is being taken. Eye exam is not current.  Hyperlipidemia  This is a chronic problem. The problem is uncontrolled. Recent lipid tests were reviewed and are normal. Exacerbating diseases include diabetes and obesity. Pertinent negatives include no chest pain, leg pain, myalgias or shortness of breath. Current antihyperlipidemic treatment includes statins. Risk factors for coronary artery disease include dyslipidemia and diabetes mellitus.  Hypertension  This is a chronic problem. The problem is unchanged. Pertinent negatives include no blurred vision, chest pain, headaches, malaise/fatigue, palpitations or shortness of breath. Past treatments include diuretics, beta blockers and angiotensin blockers. There is no history of kidney disease, CAD/MI or CVA.     Past Medical History:  Diagnosis Date  .  Arthritis    rt shoulder  . Diabetes mellitus without complication (HCC)   . Hyperlipidemia associated with type 2 diabetes mellitus (HCC) 06/02/2015  . Hypertension     Past Surgical History:  Procedure Laterality Date  . CYST REMOVAL TRUNK    . SHOULDER SURGERY Right     Family History  Problem Relation Age of Onset  . Cancer Mother        Breast CA    Social History   Social History  . Marital status: Single    Spouse name: N/A  . Number of children: N/A  . Years of education: N/A   Occupational History  . Not on file.   Social History Main Topics  . Smoking status: Light Tobacco Smoker    Types: Cigarettes  . Smokeless tobacco: Never Used     Comment: smoking 1-2 cigarettes a day  . Alcohol use No  . Drug use: No  . Sexual activity: No   Other Topics Concern  . Not on file   Social History Narrative  . No narrative on file     Current Outpatient Prescriptions:  .  aspirin 81 MG EC tablet, Take 81 mg by mouth daily., Disp: , Rfl: 5 .  atorvastatin (LIPITOR) 10 MG tablet, TAKE 1 TABLET (10 MG TOTAL) BY MOUTH DAILY AT 6 PM., Disp: 90 tablet, Rfl: 1 .  Cholecalciferol (VITAMIN D3) 2000 units TABS, Take by mouth., Disp: , Rfl:  .  empagliflozin (JARDIANCE) 25 MG TABS tablet, Take 25 mg by mouth daily., Disp: 90 tablet, Rfl: 1 .  HUMALOG KWIKPEN 100 UNIT/ML KiwkPen, INJECT 10 UNITS SUBCUTANEOUSLY 3 TIMES ADAY, Disp: 15 pen, Rfl: 2 .  LANTUS SOLOSTAR 100 UNIT/ML Solostar Pen,  INJECT 30 UNITS INTO THE SKIN 2 (TWO) TIMES DAILY., Disp: 5 pen, Rfl: 2 .  losartan (COZAAR) 50 MG tablet, TAKE 1 TABLET (50 MG TOTAL) BY MOUTH DAILY., Disp: 90 tablet, Rfl: 0 .  metoprolol tartrate (LOPRESSOR) 25 MG tablet, TAKE 1 TABLET (25 MG TOTAL) BY MOUTH 2 (TWO) TIMES DAILY., Disp: 180 tablet, Rfl: 0 .  pregabalin (LYRICA) 150 MG capsule, Take 1 capsule (150 mg total) by mouth every morning., Disp: 90 capsule, Rfl: 0 .  triamterene-hydrochlorothiazide (MAXZIDE-25) 37.5-25 MG tablet,  TAKE 1/2 TABLETS BY MOUTH DAILY., Disp: 90 tablet, Rfl: 0 .  ranitidine (ZANTAC) 150 MG tablet, Take 1 tablet (150 mg total) by mouth at bedtime. (Patient taking differently: Take 150 mg by mouth at bedtime. ), Disp: 30 tablet, Rfl: 1  No Known Allergies   Review of Systems  Constitutional: Negative for malaise/fatigue.  Eyes: Negative for blurred vision.  Respiratory: Negative for shortness of breath.   Cardiovascular: Negative for chest pain and palpitations.  Musculoskeletal: Negative for myalgias.  Neurological: Negative for headaches.  Endo/Heme/Allergies: Positive for polydipsia.      Objective  Vitals:   01/25/17 1022  BP: 132/74  Pulse: 76  Weight: 199 lb 9.6 oz (90.5 kg)  Height:  (1.727 m)    Physical Exam  Constitutional: He is oriented to person, place, and time and well-developed, well-nourished, and in no distress.  HENT:  Head: Normocephalic and atraumatic.  Cardiovascular: Normal rate, regular rhythm and normal heart sounds.   No murmur heard. Pulmonary/Chest: Effort normal and breath sounds normal. No respiratory distress. He has no wheezes.  Abdominal: Soft. Bowel sounds are normal. There is no tenderness.  Musculoskeletal: He exhibits no edema.  Neurological: He is alert and oriented to person, place, and time.  Psychiatric: Mood, memory, affect and judgment normal.  Nursing note and vitals reviewed.    Assessment & Plan  1. Essential hypertension BP stable on present antihypertensive treatment - metoprolol tartrate (LOPRESSOR) 25 MG tablet; Take 1 tablet (25 mg total) by mouth 2 (two) times daily.  Dispense: 180 tablet; Refill: 0 - triamterene-hydrochlorothiazide (MAXZIDE-25) 37.5-25 MG tablet; TAKE 1/2 TABLETS BY MOUTH DAILY.  Dispense: 90 tablet; Refill: 0 - COMPLETE METABOLIC PANEL WITH GFR; Future   2. Flu vaccine need  - Flu vaccine HIGH DOSE PF (Fluzone High dose)  3. Dyslipidemia FLP at goal, continue on statin - atorvastatin  (LIPITOR) 10 MG tablet; Take 1 tablet (10 mg total) by mouth daily at 6 PM.  Dispense: 90 tablet; Refill: 1 - Lipid panel; Future   4. Uncontrolled type 2 diabetes mellitus with peripheral neuropathy (HCC) A1c 7.5% improved,  no change in pharmacotherapy - empagliflozin (JARDIANCE) 25 MG TABS tablet; Take 25 mg by mouth daily.  Dispense: 90 tablet; Refill: 1 - pregabalin (LYRICA) 150 MG capsule; Take 1 capsule (150 mg total) by mouth every morning.  Dispense: 90 capsule; Refill: 0 - POCT glycosylated hemoglobin (Hb A1C)   [employer]zen Latulippe Asad [facility] 01/25/2017 10:35 AM

## 2017-01-28 ENCOUNTER — Telehealth: Payer: Self-pay

## 2017-01-28 NOTE — Telephone Encounter (Signed)
Called pt informed him of lab results below. Pt gave verbal understanding.  

## 2017-01-28 NOTE — Telephone Encounter (Signed)
-----   Message from Ellyn Hack, MD sent at 01/26/2017  7:52 PM EDT ----- Fasting lipid panel: Normal total cholesterol, HDL, LDL and triglycerides CMP shows elevated glucose at 130 mg/dL, normal electrolytes, kidney function and liver enzymes

## 2017-02-17 ENCOUNTER — Other Ambulatory Visit: Payer: Self-pay | Admitting: Family Medicine

## 2017-03-25 ENCOUNTER — Other Ambulatory Visit: Payer: Self-pay | Admitting: Family Medicine

## 2017-03-25 MED ORDER — INSULIN GLARGINE 100 UNIT/ML SOLOSTAR PEN: PEN_INJECTOR | SUBCUTANEOUS | 2 refills | Status: DC

## 2017-03-25 NOTE — Progress Notes (Signed)
Needs refills of insulin

## 2017-04-27 ENCOUNTER — Encounter: Payer: Self-pay | Admitting: Family Medicine

## 2017-04-27 ENCOUNTER — Ambulatory Visit (INDEPENDENT_AMBULATORY_CARE_PROVIDER_SITE_OTHER): Payer: Medicare Other | Admitting: Family Medicine

## 2017-04-27 VITALS — BP 134/76 | HR 71 | Temp 98.1°F | Resp 16 | Wt 193.8 lb

## 2017-04-27 DIAGNOSIS — I10 Essential (primary) hypertension: Secondary | ICD-10-CM

## 2017-04-27 DIAGNOSIS — E785 Hyperlipidemia, unspecified: Secondary | ICD-10-CM | POA: Diagnosis not present

## 2017-04-27 DIAGNOSIS — E1142 Type 2 diabetes mellitus with diabetic polyneuropathy: Secondary | ICD-10-CM | POA: Diagnosis not present

## 2017-04-27 DIAGNOSIS — Z23 Encounter for immunization: Secondary | ICD-10-CM | POA: Diagnosis not present

## 2017-04-27 LAB — GLUCOSE, POCT (MANUAL RESULT ENTRY): POC Glucose: 117 mg/dl — AB (ref 70–99)

## 2017-04-27 LAB — POCT GLYCOSYLATED HEMOGLOBIN (HGB A1C): Hemoglobin A1C: 7.2

## 2017-04-27 MED ORDER — EMPAGLIFLOZIN 25 MG PO TABS
25.0000 mg | ORAL_TABLET | Freq: Every day | ORAL | 1 refills | Status: DC
Start: 2017-04-27 — End: 2017-07-26

## 2017-04-27 MED ORDER — TRIAMTERENE-HCTZ 37.5-25 MG PO TABS: ORAL_TABLET | ORAL | 0 refills | Status: DC

## 2017-04-27 MED ORDER — INSULIN LISPRO 100 UNIT/ML (KWIKPEN): PEN_INJECTOR | SUBCUTANEOUS | 2 refills | Status: DC

## 2017-04-27 MED ORDER — METOPROLOL TARTRATE 25 MG PO TABS: 25.0000 mg | ORAL_TABLET | Freq: Two times a day (BID) | ORAL | 0 refills | Status: DC

## 2017-04-27 MED ORDER — PREGABALIN 150 MG PO CAPS: 150.0000 mg | ORAL_CAPSULE | Freq: Every morning | ORAL | 0 refills | Status: DC

## 2017-04-27 MED ORDER — ATORVASTATIN CALCIUM 10 MG PO TABS: 10.0000 mg | ORAL_TABLET | Freq: Every day | ORAL | 1 refills | Status: DC

## 2017-04-27 MED ORDER — LOSARTAN POTASSIUM 50 MG PO TABS: ORAL_TABLET | ORAL | 0 refills | Status: DC

## 2017-04-27 NOTE — Progress Notes (Signed)
Name: William Parks   MRN: [id number]    DOB: [birth year]   Date:04/27/2017       Progress Note  Subjective  Chief Complaint  Chief Complaint  Patient presents with  . Diabetes  . Hypertension  . Hyperlipidemia  . Medication Refill    Diabetes  He presents for his follow-up diabetic visit. He has type 2 diabetes mellitus. His disease course has been stable. There are no hypoglycemic associated symptoms. Pertinent negatives for hypoglycemia include no headaches. Pertinent negatives for diabetes include no blurred vision, no chest pain, no foot paresthesias, no polydipsia and no polyuria. There are no hypoglycemic complications. Diabetic complications include peripheral neuropathy. Pertinent negatives for diabetic complications include no CVA or heart disease. Current diabetic treatment includes intensive insulin program and oral agent (monotherapy). His weight is stable. He is following a generally healthy diet. He monitors blood glucose at home 1-2 x per day. His breakfast blood glucose range is generally 140-180 mg/dl. An ACE inhibitor/angiotensin II receptor blocker is being taken. Eye exam is not current.  Hypertension  This is a chronic problem. The problem is unchanged. The problem is controlled. Pertinent negatives include no blurred vision, chest pain, headaches, malaise/fatigue, orthopnea, palpitations or shortness of breath. Past treatments include diuretics, beta blockers and angiotensin blockers. There is no history of kidney disease, CAD/MI or CVA.  Hyperlipidemia  This is a chronic problem. The problem is uncontrolled. Recent lipid tests were reviewed and are normal. Exacerbating diseases include diabetes and obesity. Pertinent negatives include no chest pain, leg pain or shortness of breath. Current antihyperlipidemic treatment includes statins. Risk factors for coronary artery disease include diabetes mellitus and dyslipidemia.    Past Medical History:  Diagnosis Date  . Arthritis     rt shoulder  . Diabetes mellitus without complication (HCC)   . Hyperlipidemia associated with type 2 diabetes mellitus (HCC) 06/02/2015  . Hypertension     Past Surgical History:  Procedure Laterality Date  . CYST REMOVAL TRUNK    . SHOULDER SURGERY Right     Family History  Problem Relation Age of Onset  . Cancer Mother        Breast CA    Social History   Socioeconomic History  . Marital status: Single    Spouse name: Not on file  . Number of children: Not on file  . Years of education: Not on file  . Highest education level: Not on file  Social Needs  . Financial resource strain: Not on file  . Food insecurity - worry: Not on file  . Food insecurity - inability: Not on file  . Transportation needs - medical: Not on file  . Transportation needs - non-medical: Not on file  Occupational History  . Not on file  Tobacco Use  . Smoking status: Light Tobacco Smoker    Types: Cigarettes  . Smokeless tobacco: Never Used  . Tobacco comment: smoking 1-2 cigarettes a day  Substance and Sexual Activity  . Alcohol use: No    Alcohol/week: 0.0 oz  . Drug use: No  . Sexual activity: No  Other Topics Concern  . Not on file  Social History Narrative  . Not on file     Current Outpatient Medications:  .  aspirin 81 MG EC tablet, Take 81 mg by mouth daily., Disp: , Rfl: 5 .  atorvastatin (LIPITOR) 10 MG tablet, Take 1 tablet (10 mg total) by mouth daily at 6 PM., Disp: 90 tablet, Rfl: 1 .  Cholecalciferol (  VITAMIN D3) 2000 units TABS, Take by mouth., Disp: , Rfl:  .  empagliflozin (JARDIANCE) 25 MG TABS tablet, Take 25 mg by mouth daily., Disp: 90 tablet, Rfl: 1 .  HUMALOG KWIKPEN 100 UNIT/ML KiwkPen, INJECT 10 UNITS SUBCUTANEOUSLY 3 TIMES ADAY, Disp: 15 pen, Rfl: 2 .  Insulin Glargine (LANTUS SOLOSTAR) 100 UNIT/ML Solostar Pen, INJECT 30 UNITS INTO THE SKIN 2 (TWO) TIMES DAILY., Disp: 5 pen, Rfl: 2 .  losartan (COZAAR) 50 MG tablet, TAKE 1 TABLET (50 MG TOTAL) BY MOUTH  DAILY., Disp: 90 tablet, Rfl: 0 .  metoprolol tartrate (LOPRESSOR) 25 MG tablet, Take 1 tablet (25 mg total) by mouth 2 (two) times daily., Disp: 180 tablet, Rfl: 0 .  pregabalin (LYRICA) 150 MG capsule, Take 1 capsule (150 mg total) by mouth every morning., Disp: 90 capsule, Rfl: 0 .  triamterene-hydrochlorothiazide (MAXZIDE-25) 37.5-25 MG tablet, TAKE 1/2 TABLETS BY MOUTH DAILY., Disp: 90 tablet, Rfl: 0 .  ranitidine (ZANTAC) 150 MG tablet, Take 1 tablet (150 mg total) by mouth at bedtime. (Patient not taking: Reported on 04/27/2017), Disp: 30 tablet, Rfl: 1  No Known Allergies   Review of Systems  Constitutional: Negative for malaise/fatigue.  Eyes: Negative for blurred vision.  Respiratory: Negative for shortness of breath.   Cardiovascular: Negative for chest pain, palpitations and orthopnea.  Neurological: Negative for headaches.  Endo/Heme/Allergies: Negative for polydipsia.     Objective  Vitals:   04/27/17 0902  BP: 134/76  Pulse: 71  Resp: 16  Temp: 98.1 F (36.7 C)  TempSrc: Oral  SpO2: 94%  Weight: 193 lb 12.8 oz (87.9 kg)    Physical Exam  Constitutional: He is oriented to person, place, and time and well-developed, well-nourished, and in no distress.  HENT:  Head: Normocephalic and atraumatic.  Cardiovascular: Normal rate, regular rhythm and normal heart sounds.  No murmur heard. Pulmonary/Chest: Effort normal and breath sounds normal. No respiratory distress. He has no wheezes.  Abdominal: Soft. Bowel sounds are normal. There is no tenderness.  Musculoskeletal: He exhibits no edema.  Neurological: He is alert and oriented to person, place, and time.  Psychiatric: Mood, memory, affect and judgment normal.  Nursing note and vitals reviewed.     Recent Results (from the past 2160 hour(s))  POCT Glucose (CBG)     Status: Abnormal   Collection Time: 04/27/17  9:07 AM  Result Value Ref Range   POC Glucose 117 (A) 70 - 99 mg/dl  POCT HgB Z6X     Status:  Abnormal   Collection Time: 04/27/17  9:11 AM  Result Value Ref Range   Hemoglobin A1C 7.2      Assessment & Plan  1. 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  Dispense: 90 tablet; Refill: 1 - pregabalin (LYRICA) 150 MG capsule; Take 1 capsule (150 mg total) by mouth every morning.  Dispense: 90 capsule; Refill: 0 - insulin lispro (HUMALOG KWIKPEN) 100 UNIT/ML KiwkPen; INJECT 10 UNITS SUBCUTANEOUSLY 3 TIMES ADAY  Dispense: 15 pen; Refill: 2  2. Need for 23-polyvalent pneumococcal polysaccharide vaccine  - Pneumococcal polysaccharide vaccine 23-valent greater than or equal to 2yo subcutaneous/IM  3. Essential hypertension  BP stable on present antihypertensive treatment - losartan (COZAAR) 50 MG tablet; TAKE 1 TABLET (50 MG TOTAL) BY MOUTH DAILY.  Dispense: 90 tablet; Refill: 0 - metoprolol tartrate (LOPRESSOR) 25 MG tablet; Take 1 tablet (25 mg total) by mouth 2 (two) times daily.  Dispense: 180 tablet; Refill: 0 - triamterene-hydrochlorothiazide (MAXZIDE-25) 37.5-25 MG tablet; TAKE 1/2 TABLETS BY MOUTH DAILY.  Dispense: 90 tablet; Refill: 0  4. Dyslipidemia FLP at goal from October 2018 - atorvastatin (LIPITOR) 10 MG tablet; Take 1 tablet (10 mg total) by mouth daily at 6 PM.  Dispense: 90 tablet; Refill: 1   Saxton Chain Asad A. Faylene KurtzShah [facility] 04/27/2017 9:35 AM

## 2017-05-03 ENCOUNTER — Ambulatory Visit (INDEPENDENT_AMBULATORY_CARE_PROVIDER_SITE_OTHER): Payer: Medicare Other

## 2017-05-03 VITALS — BP 124/70 | HR 68 | Temp 97.7°F | Resp 12 | Ht 68.0 in | Wt 198.6 lb

## 2017-05-03 DIAGNOSIS — Z0000 Encounter for general adult medical examination without abnormal findings: Secondary | ICD-10-CM

## 2017-05-03 DIAGNOSIS — Z1211 Encounter for screening for malignant neoplasm of colon: Secondary | ICD-10-CM | POA: Diagnosis not present

## 2017-05-03 NOTE — Progress Notes (Signed)
Subjective:   William Parks is a 72 y.o. male who presents for Medicare Annual/Subsequent preventive examination.  Review of Systems:  N/A Cardiac Risk Factors include: advanced age (>43men, >67 women);diabetes mellitus;dyslipidemia;obesity (BMI >30kg/m2);male gender;hypertension;smoking/ tobacco exposure     Objective:    Vitals: BP 124/70 (BP Location: Left Arm, Patient Position: Sitting, Cuff Size: Large)   Pulse 68   Temp 97.7 F (36.5 C) (Oral)   Resp 12   Ht 5\' 8"  (1.727 m)   Wt 198 lb 9.6 oz (90.1 kg)   BMI 30.20 kg/m   Body mass index is 30.2 kg/m.  Advanced Directives 05/03/2017 10/25/2016 07/23/2016 06/30/2016 06/01/2016 05/31/2016 04/21/2016  Does Patient Have a Medical Advance Directive? No No No No No Yes;No No  Does patient want to make changes to medical advance directive? - - - - - No - Patient declined -  Would patient like information on creating a medical advance directive? Yes (MAU/Ambulatory/Procedural Areas - Information given) - - - - - -   Pt has been provided with the "MOST" and "DNR" documents for his review. Pt has been advised that he will only need to complete the document that is most appropriate to suit his health care wishes. Once completed, pt has been advised to return the appropriate document to the office for the physician to review and sign. Verbalized acceptance and understanding.  Tobacco Social History   Tobacco Use  Smoking Status Light Tobacco Smoker  . Packs/day: 0.25  . Years: 50.00  . Pack years: 12.50  . Types: Cigarettes  Smokeless Tobacco Never Used  Tobacco Comment   smoking 1-2 cigarettes a day     Ready to quit: Yes Counseling given: Yes. Smoking cessation literature provided to pt today. Comment: smoking 1-2 cigarettes a day  Clinical Intake:  Pre-visit preparation completed: Yes  Pain : No/denies pain  BMI - recorded: 30.2 Nutritional Status: BMI > 30  Obese Nutritional Risks: None Has the patient had any N/V/D  within the last 2 months?  No  Does the patient have any non-healing wounds?  No Has the patient had any unintentional weight loss or weight gain?  No Is the patient diabetic?  Yes If diabetic, was a CBG obtained today?  No If CBG was obtained, was CBG resulted in Enter/ Edit results?: No Did the patient bring in their glucometer from home?  No Comments: Message sent to Care Guide re: need for a new glucometer.  How often do you need to have someone help you when you read instructions, pamphlets, or other written materials from your doctor or pharmacy?: 1 - Never  Interpreter Needed?: No  Information entered by :: William Sinner, LPN  Past Medical History:  Diagnosis Date  . Arthritis    rt shoulder  . Diabetes mellitus without complication (HCC)   . Hyperlipidemia associated with type 2 diabetes mellitus (HCC) 06/02/2015  . Hypertension    Past Surgical History:  Procedure Laterality Date  . CYST REMOVAL TRUNK    . SHOULDER SURGERY Right    Family History  Problem Relation Age of Onset  . Cancer Mother        Breast CA  . Diabetes Brother    Social History   Socioeconomic History  . Marital status: Divorced    Spouse name: None  . Number of children: 3  . Years of education: None  . Highest education level: 10th grade  Social Needs  . Financial resource strain: Not hard at all  .  Food insecurity - worry: Never true  . Food insecurity - inability: Never true  . Transportation needs - medical: No  . Transportation needs - non-medical: No  Occupational History  . Occupation: Retired  Tobacco Use  . Smoking status: Light Tobacco Smoker    Packs/day: 0.25    Years: 50.00    Pack years: 12.50    Types: Cigarettes  . Smokeless tobacco: Never Used  . Tobacco comment: smoking 1-2 cigarettes a day  Substance and Sexual Activity  . Alcohol use: No    Alcohol/week: 0.0 oz  . Drug use: No  . Sexual activity: No  Other Topics Concern  . None  Social History Narrative  .  None    Outpatient Encounter Medications as of 05/03/2017  Medication Sig  . aspirin 81 MG EC tablet Take 81 mg by mouth daily.  Marland Kitchen atorvastatin (LIPITOR) 10 MG tablet Take 1 tablet (10 mg total) by mouth daily at 6 PM.  . Cholecalciferol (VITAMIN D3) 2000 units TABS Take by mouth.  . empagliflozin (JARDIANCE) 25 MG TABS tablet Take 25 mg by mouth daily.  . Insulin Glargine (LANTUS SOLOSTAR) 100 UNIT/ML Solostar Pen INJECT 30 UNITS INTO THE SKIN 2 (TWO) TIMES DAILY.  Marland Kitchen insulin lispro (HUMALOG KWIKPEN) 100 UNIT/ML KiwkPen INJECT 10 UNITS SUBCUTANEOUSLY 3 TIMES ADAY  . losartan (COZAAR) 50 MG tablet TAKE 1 TABLET (50 MG TOTAL) BY MOUTH DAILY.  . metoprolol tartrate (LOPRESSOR) 25 MG tablet Take 1 tablet (25 mg total) by mouth 2 (two) times daily.  . pregabalin (LYRICA) 150 MG capsule Take 1 capsule (150 mg total) by mouth every morning.  . triamterene-hydrochlorothiazide (MAXZIDE-25) 37.5-25 MG tablet TAKE 1/2 TABLETS BY MOUTH DAILY.  . ranitidine (ZANTAC) 150 MG tablet Take 1 tablet (150 mg total) by mouth at bedtime. (Patient not taking: Reported on 04/27/2017)   No facility-administered encounter medications on file as of 05/03/2017.     Activities of Daily Living In your present state of health, do you have any difficulty performing the following activities: 05/03/2017 04/27/2017  Hearing? N N  Comment denies wearing hearing aids -  Vision? N Y  Comment wears reading glasses -  Difficulty concentrating or making decisions? Y N  Comment short term memory loss -  Walking or climbing stairs? N N  Dressing or bathing? N N  Doing errands, shopping? N N  Preparing Food and eating ? N -  Comment denies wearing dentures -  Using the Toilet? N -  Managing your Medications? N -  Managing your Finances? N -  Housekeeping or managing your Housekeeping? N -  Some recent data might be hidden    Patient Care Team: William Hack, MD as PCP - General (Family Medicine)   Assessment:   This is a  routine wellness examination for William Parks.  Exercise Activities and Dietary recommendations Current Exercise Habits: The patient does not participate in regular exercise at present, Exercise limited by: None identified  Goals    . DIET - INCREASE WATER INTAKE     Recommend to drink at least 6-8 8 oz glasses of water per day        Fall Risk Fall Risk  05/03/2017 04/27/2017 01/25/2017 10/25/2016 07/23/2016  Falls in the past year? No No No No No  Number falls in past yr: - - - - -  Injury with Fall? - - - - -  Follow up - - - - -   Is the patient's home free  of loose throw rugs in walkways, pet beds, electrical cords, etc?   yes      Grab bars in the bathroom? yes  Denies use of a shower chair      Handrails on the stairs?   No. Denies having any stairs in or around home.      Adequate lighting?   yes   Denies use of a cane, walker or w/c. Also denies use of an elevated toilet seat.  Timed Get Up and Go Performed: Yes. 8 sec to ambulate 10 feet. Gait slow and steady. No intervention required at this time.   Depression Screen PHQ 2/9 Scores 05/03/2017 04/27/2017 01/25/2017 10/25/2016  PHQ - 2 Score 2 0 0 0  PHQ- 9 Score 4 - - -    Cognitive Function     6CIT Screen 05/03/2017 05/31/2016  What Year? 0 points 0 points  What month? 0 points 0 points  What time? 0 points 0 points  Count back from 20 0 points 0 points  Months in reverse 0 points 0 points  Repeat phrase 8 points 2 points  Total Score 8 2    Immunization History  Administered Date(s) Administered  . Influenza, High Dose Seasonal PF 04/01/2015, 01/25/2017  . Influenza,inj,Quad PF,6+ Mos 01/07/2016  . Pneumococcal Conjugate-13 04/21/2016  . Pneumococcal Polysaccharide-23 04/27/2017    Qualifies for Shingles Vaccine? Yes. Due for Zostavax or Shingrix vaccine. Education has been provided regarding the importance of this vaccine. Pt has been advised to call his insurance company to determine his out of pocket expense. Advised he  may also receive this vaccine at his local pharmacy or Health Dept. Verbalized acceptance and understanding.  Screening Tests Health Maintenance  Topic Date Due  . Fecal DNA (Cologuard)  10/01/1994  . OPHTHALMOLOGY EXAM  04/26/2014  . TETANUS/TDAP  04/27/2018 (Originally [birth year]01/1964)  . HEMOGLOBIN A1C  10/25/2017  . FOOT EXAM  01/25/2018  . INFLUENZA VACCINE  Completed  . Hepatitis C Screening  Completed  . PNA vac Low Risk Adult  Completed   Due for Tdap. Education has been provided regarding the importance of this vaccine. Pt has been advised to call his insurance company to determine his out of pocket expense. Advised he may also receive this vaccine at his local pharmacy or Health Dept. Verbalized acceptance and understanding.  Completed DM foot exam 01/25/17. Advised to keep appt as scheduled with Dr. Carlynn PurlSowles.  Due for DM eye exam. Not currently established with a physician. Education has been provided regarding the importance of this exam. Provided pt with a list of various providers. Advised to call and establish care.Verbalized acceptance and understanding.  Cancer Screenings: Lung: Low Dose CT Chest recommended if Age 3-80 years, 30 pack-year currently smoking OR have quit w/in 15years. Patient does qualify. An Epic message has been sent to Glenna FellowsShawn Perkins, RN (Oncology Nurse Navigator) regarding the possible need for this exam. Ines BloomerShawn will review the patient's chart and review all previous imaging, then will collaborate with radiology to determine if the patient truly qualifies for the exam. If the patient qualifies, Ines BloomerShawn will order the Low Dose CT of the chest and will call the patient to schedule the exam.  Colorectal: Cologuard ordered 06/01/16. Re-ordered again today. Education has been provided regarding the importance of this exam. Provided pt with a brochure to follow up on the status of his package. Also sent a message to our referral coordinator to follow up as well.  Additional  Screenings: Hepatitis B/HIV/Syphillis: Does  not qualify Hepatitis C Screening: Completed 06/01/16    Plan:   In preparation for this patient's Medicare Annual Wellness visit, I have personally reviewed the following information in the patient's chart:  A. Medical and Surgical History B. Office visits, Hospitalizations and ER visits within the last 12 months C. Radiology, Laboratory and Pathological Reports (including those records in Care Everywhere) D. Health Maintenance for accuracy and any attached, scanned or relevant reports or letters E.  Immunization History F.  Upcoming referrals and appointments G. Advance directives and Code Status  I have personally addressed the Medicare Wellness Questionnaire with the patient and have noted and/or up dated the following information:  A.  Current medications (including OTC medications) B. Medication Allergies C. Medical and Surgical History D.  Physicians on the patient's care team E. Vitals F. Physical activity G. Functional ability and status H. Nutritional status I. Risk for fall and preventative measures (including TUG test) J. Use of alcohol, tobacco or illicit drugs  K.          Screenings such as hearing, vision, cognitive and depression L. Advance Directives and Code Status M. Realistic Patient Goals N. Financial and Social strains, if any  In addition, I have reviewed and discussed with the patient, certain preventive protocols, quality metrics, and best practice recommendations. To ensure quality care and proper  continuity of care, any concerns that arose during the Medicare Wellness visit were addressed with the patient's physician. A written personalized care plan for preventive services as well as general preventive health recommendations were provided to patient.  Signed,  Deon Pilling, LPN

## 2017-05-03 NOTE — Patient Instructions (Signed)
William Parks , Thank you for taking time to come for your Medicare Wellness Visit. I appreciate your ongoing commitment to your health goals. Please review the following plan we discussed and let me know if I can assist you in the future.   Screening recommendations/referrals: Colonoscopy: Please refer to the brochure I have provided you with today. Please call to follow up on the status of your package. Lung: A message has been sent to Glenna Fellows, RN (Oncology Nurse Navigator) regarding the possible need for this exam. Ines Bloomer will review your chart and review all previous imaging, then will collaborate with radiology to determine if you truly qualify for this exam. If you qualify, Ines Bloomer will order the Low Dose CT of the chest and will call you to schedule the exam. Hepatitis C Screening: Completed 06/01/16  Vision/Dental/Diabetic Exams: Completed DM foot exam 01/25/17. Please keep your appointment as scheduled with Dr. Carlynn Purl. Due for DM eye exam. Please call to establish care with one of the physicians listed below. Recommended yearly dental visit for hygiene and checkup  Vaccinations: Influenza vaccine: Completed 01/25/17 Pneumococcal vaccine: Completed PCV13 04/21/16 and PPSV23 04/27/17 Tdap vaccine: Declined. Please call your insurance company to determine your out of pocket expense. You may also receive this vaccine at your local pharmacy or Health Dept. Shingles vaccine: Declined. Please call your insurance company to determine your out of pocket expense. You may also receive this vaccine at your local pharmacy or Health Dept.  Advanced directives: Advance directive discussed with you today. I have provided a copy for you to complete at home and have notarized. Once this is complete please bring a copy in to our office so we can scan it into your chart.  Conditions/risks identified: Recommend to drink at least 6-8 8 oz glasses of water per day  Next appointment: You are scheduled to see Dr.  Carlynn Purl on 07/26/17 @ 10:20am.   Please schedule your Annual Wellness Visit with your Nurse Health Advisor in one year.  Your provider would like to you have your annual eye exam. Please contact your current eye doctor to schedule your annual eye exam to evaluate the health of your eyes. If you have not yet established care with a physician to evaluate the health of your eyes, below is a list of physicians for you to contact and to establish care.   [facility] Address: [address] [place], [place] [address]   Address: [address], [place], [place] [address]  Phone: [phone]      Phone: [phone]  Website: [email or web address]    Website: [email or web address]     [facility]  Address: [address] [place], [place], [place] [address]   Address: [address] [place], [place], [place] [address] Phone: [phone]      Phone: [phone]    [facility] Address: [address] [place], [place], [place] [address]  Phone: [phone]   Preventive Care 65 Years and Older, Male Preventive care refers to lifestyle choices and visits with your health care provider that can promote health and wellness. What does preventive care include?  A yearly physical exam. This is also called an annual well check.  Dental exams once or twice a year.  Routine eye exams. Ask your health care provider how often you should have your eyes checked.  Personal lifestyle choices, including:  Daily care  of your teeth and gums.  Regular physical activity.  Eating a healthy diet.  Avoiding tobacco and drug use.  Limiting alcohol use.  Practicing safe sex.  Taking low doses of aspirin every day.  Taking vitamin and mineral supplements as recommended by your health care provider. What happens during an annual well check? The services and screenings done by your health care provider during your annual well check will depend on your age, overall  health, lifestyle risk factors, and family history of disease. Counseling  Your health care provider may ask you questions about your:  Alcohol use.  Tobacco use.  Drug use.  Emotional well-being.  Home and relationship well-being.  Sexual activity.  Eating habits.  History of falls.  Memory and ability to understand (cognition).  Work and work Astronomer. Screening  You may have the following tests or measurements:  Height, weight, and BMI.  Blood pressure.  Lipid and cholesterol levels. These may be checked every 5 years, or more frequently if you are over 28 years old.  Skin check.  Lung cancer screening. You may have this screening every year starting at age 26 if you have a 30-pack-year history of smoking and currently smoke or have quit within the past 15 years.  Fecal occult blood test (FOBT) of the stool. You may have this test every year starting at age 70.  Flexible sigmoidoscopy or colonoscopy. You may have a sigmoidoscopy every 5 years or a colonoscopy every 10 years starting at age 16.  Prostate cancer screening. Recommendations will vary depending on your family history and other risks.  Hepatitis C blood test.  Hepatitis B blood test.  Sexually transmitted disease (STD) testing.  Diabetes screening. This is done by checking your blood sugar (glucose) after you have not eaten for a while (fasting). You may have this done every 1-3 years.  Abdominal aortic aneurysm (AAA) screening. You may need this if you are a current or former smoker.  Osteoporosis. You may be screened starting at age 1 if you are at high risk. Talk with your health care provider about your test results, treatment options, and if necessary, the need for more tests. Vaccines  Your health care provider may recommend certain vaccines, such as:  Influenza vaccine. This is recommended every year.  Tetanus, diphtheria, and acellular pertussis (Tdap, Td) vaccine. You may need a Td  booster every 10 years.  Zoster vaccine. You may need this after age 39.  Pneumococcal 13-valent conjugate (PCV13) vaccine. One dose is recommended after age 12.  Pneumococcal polysaccharide (PPSV23) vaccine. One dose is recommended after age 24. Talk to your health care provider about which screenings and vaccines you need and how often you need them. This information is not intended to replace advice given to you by your health care provider. Make sure you discuss any questions you have with your health care provider. Document Released: 05/09/2015 Document Revised: 12/31/2015 Document Reviewed: 02/11/2015 Elsevier Interactive Patient Education  2017 [employer].  Fall Prevention in the Home Falls can cause injuries. They can happen to people of all ages. There are many things you can do to make your home safe and to help prevent falls. What can I do on the outside of my home?  Regularly fix the edges of walkways and driveways and fix any cracks.  Remove anything that might make you trip as you walk through a door, such as a raised step or threshold.  Trim any bushes or trees on the path to  your home.  Use bright outdoor lighting.  Clear any walking paths of anything that might make someone trip, such as rocks or tools.  Regularly check to see if handrails are loose or broken. Make sure that both sides of any steps have handrails.  Any raised decks and porches should have guardrails on the edges.  Have any leaves, snow, or ice cleared regularly.  Use sand or salt on walking paths during winter.  Clean up any spills in your garage right away. This includes oil or grease spills. What can I do in the bathroom?  Use night lights.  Install grab bars by the toilet and in the tub and shower. Do not use towel bars as grab bars.  Use non-skid mats or decals in the tub or shower.  If you need to sit down in the shower, use a plastic, non-slip stool.  Keep the floor dry. Clean up  any water that spills on the floor as soon as it happens.  Remove soap buildup in the tub or shower regularly.  Attach bath mats securely with double-sided non-slip rug tape.  Do not have throw rugs and other things on the floor that can make you trip. What can I do in the bedroom?  Use night lights.  Make sure that you have a light by your bed that is easy to reach.  Do not use any sheets or blankets that are too big for your bed. They should not hang down onto the floor.  Have a firm chair that has side arms. You can use this for support while you get dressed.  Do not have throw rugs and other things on the floor that can make you trip. What can I do in the kitchen?  Clean up any spills right away.  Avoid walking on wet floors.  Keep items that you use a lot in easy-to-reach places.  If you need to reach something above you, use a strong step stool that has a grab bar.  Keep electrical cords out of the way.  Do not use floor polish or wax that makes floors slippery. If you must use wax, use non-skid floor wax.  Do not have throw rugs and other things on the floor that can make you trip. What can I do with my stairs?  Do not leave any items on the stairs.  Make sure that there are handrails on both sides of the stairs and use them. Fix handrails that are broken or loose. Make sure that handrails are as long as the stairways.  Check any carpeting to make sure that it is firmly attached to the stairs. Fix any carpet that is loose or worn.  Avoid having throw rugs at the top or bottom of the stairs. If you do have throw rugs, attach them to the floor with carpet tape.  Make sure that you have a light switch at the top of the stairs and the bottom of the stairs. If you do not have them, ask someone to add them for you. What else can I do to help prevent falls?  Wear shoes that:  Do not have high heels.  Have rubber bottoms.  Are comfortable and fit you well.  Are  closed at the toe. Do not wear sandals.  If you use a stepladder:  Make sure that it is fully opened. Do not climb a closed stepladder.  Make sure that both sides of the stepladder are locked into place.  Ask someone to  hold it for you, if possible.  Clearly mark and make sure that you can see:  Any grab bars or handrails.  First and last steps.  Where the edge of each step is.  Use tools that help you move around (mobility aids) if they are needed. These include:  Canes.  Walkers.  Scooters.  Crutches.  Turn on the lights when you go into a dark area. Replace any light bulbs as soon as they burn out.  Set up your furniture so you have a clear path. Avoid moving your furniture around.  If any of your floors are uneven, fix them.  If there are any pets around you, be aware of where they are.  Review your medicines with your doctor. Some medicines can make you feel dizzy. This can increase your chance of falling. Ask your doctor what other things that you can do to help prevent falls. This information is not intended to replace advice given to you by your health care provider. Make sure you discuss any questions you have with your health care provider. Document Released: 02/06/2009 Document Revised: 09/18/2015 Document Reviewed: 05/17/2014 Elsevier Interactive Patient Education  2017 Elsevier Inc.  Coping with Quitting Smoking Quitting smoking is a physical and mental challenge. You will face cravings, withdrawal symptoms, and temptation. Before quitting, work with your health care provider to make a plan that can help you cope. Preparation can help you quit and keep you from giving in. How can I cope with cravings? Cravings usually last for 5-10 minutes. If you get through it, the craving will pass. Consider taking the following actions to help you cope with cravings:  Keep your mouth busy: ? Chew sugar-free gum. ? Suck on hard candies or a straw. ? Brush your  teeth.  Keep your hands and body busy: ? Immediately change to a different activity when you feel a craving. ? Squeeze or play with a ball. ? Do an activity or a hobby, like making bead jewelry, practicing needlepoint, or working with wood. ? Mix up your normal routine. ? Take a short exercise break. Go for a quick walk or run up and down stairs. ? Spend time in public places where smoking is not allowed.  Focus on doing something kind or helpful for someone else.  Call a friend or family member to talk during a craving.  Join a support group.  Call a quit line, such as 1-800-QUIT-NOW.  Talk with your health care provider about medicines that might help you cope with cravings and make quitting easier for you.  How can I deal with withdrawal symptoms? Your body may experience negative effects as it tries to get used to not having nicotine in the system. These effects are called withdrawal symptoms. They may include:  Feeling hungrier than normal.  Trouble concentrating.  Irritability.  Trouble sleeping.  Feeling depressed.  Restlessness and agitation.  Craving a cigarette.  To manage withdrawal symptoms:  Avoid places, people, and activities that trigger your cravings.  Remember why you want to quit.  Get plenty of sleep.  Avoid coffee and other caffeinated drinks. These may worsen some of your symptoms.  How can I handle social situations? Social situations can be difficult when you are quitting smoking, especially in the first few weeks. To manage this, you can:  Avoid parties, bars, and other social situations where people might be smoking.  Avoid alcohol.  Leave right away if you have the urge to smoke.  Explain to your  family and friends that you are quitting smoking. Ask for understanding and support.  Plan activities with friends or family where smoking is not an option.  What are some ways I can cope with stress? Wanting to smoke may cause stress,  and stress can make you want to smoke. Find ways to manage your stress. Relaxation techniques can help. For example:  Breathe slowly and deeply, in through your nose and out through your mouth.  Listen to soothing, relaxing music.  Talk with a family member or friend about your stress.  Light a candle.  Soak in a bath or take a shower.  Think about a peaceful place.  What are some ways I can prevent weight gain? Be aware that many people gain weight after they quit smoking. However, not everyone does. To keep from gaining weight, have a plan in place before you quit and stick to the plan after you quit. Your plan should include:  Having healthy snacks. When you have a craving, it may help to: ? Eat plain popcorn, crunchy carrots, celery, or other cut vegetables. ? Chew sugar-free gum.  Changing how you eat: ? Eat small portion sizes at meals. ? Eat 4-6 small meals throughout the day instead of 1-2 large meals a day. ? Be mindful when you eat. Do not watch television or do other things that might distract you as you eat.  Exercising regularly: ? Make time to exercise each day. If you do not have time for a long workout, do short bouts of exercise for 5-10 minutes several times a day. ? Do some form of strengthening exercise, like weight lifting, and some form of aerobic exercise, like running or swimming.  Drinking plenty of water or other low-calorie or no-calorie drinks. Drink 6-8 glasses of water daily, or as much as instructed by your health care provider.  Summary  Quitting smoking is a physical and mental challenge. You will face cravings, withdrawal symptoms, and temptation to smoke again. Preparation can help you as you go through these challenges.  You can cope with cravings by keeping your mouth busy (such as by chewing gum), keeping your body and hands busy, and making calls to family, friends, or a helpline for people who want to quit smoking.  You can cope with  withdrawal symptoms by avoiding places where people smoke, avoiding drinks with caffeine, and getting plenty of rest.  Ask your health care provider about the different ways to prevent weight gain, avoid stress, and handle social situations. This information is not intended to replace advice given to you by your health care provider. Make sure you discuss any questions you have with your health care provider. Document Released: 04/09/2016 Document Revised: 04/09/2016 Document Reviewed: 04/09/2016 Elsevier Interactive Patient Education  [employer]2018 Elsevier Inc.

## 2017-05-04 ENCOUNTER — Telehealth: Payer: Self-pay | Admitting: *Deleted

## 2017-05-04 NOTE — Telephone Encounter (Signed)
Received referral for initial lung cancer screening scan. Contacted patient and obtained smoking history. Patient reports never smoking more than 1 to 2 cigarettes per day for his 50 years of smoking history. Patient is not a candidate for lung cancer screening and is aware of this. Did discuss trying to cease smoking completely.

## 2017-05-12 IMAGING — CT CT ABD-PELV W/ CM
2 of 5 series · 16 of 46 positions shown, 18 images · IV contrast (iopamidol)
Comparison: Chest x-ray 08/01/2012

CLINICAL DATA: Upper abdominal pain several days.

EXAM:
CT ABDOMEN AND PELVIS WITH CONTRAST
TECHNIQUE: Multidetector CT imaging of the abdomen and pelvis was performed
using the standard protocol following bolus administration of
intravenous contrast.
CONTRAST:  100mL [id number] IOPAMIDOL ([id number]) INJECTION 61%

[Series 2: routine abd pel with · axial · 0.74mm/px · z∈[-1079,-649]mm · 13 of 98 slices shown, 15 images]
[im 6/98  soft-tissue]
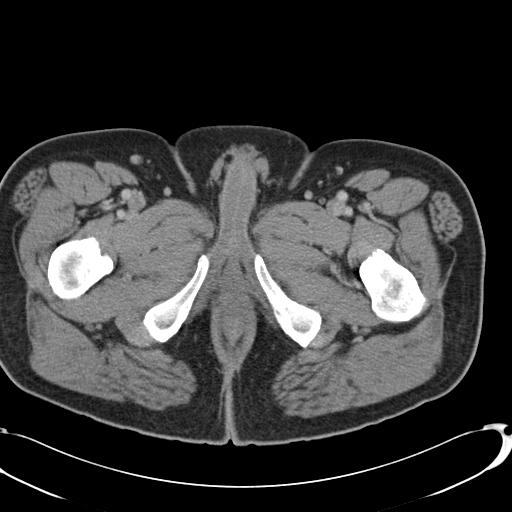
[im 6/98  bone]
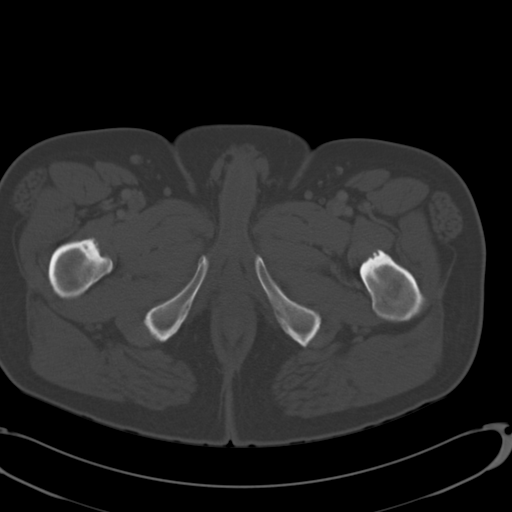
[im 11/98  soft-tissue]
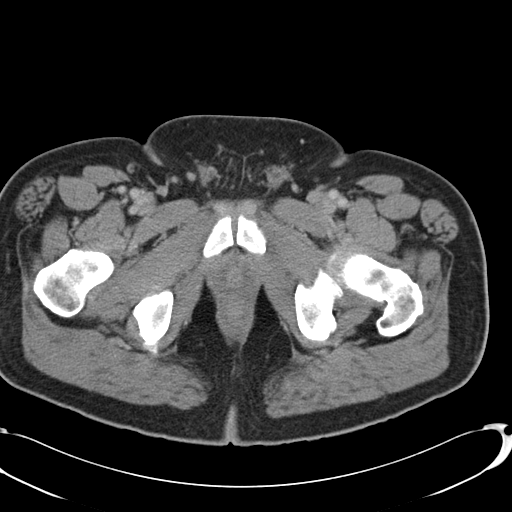
[im 22/98  soft-tissue]
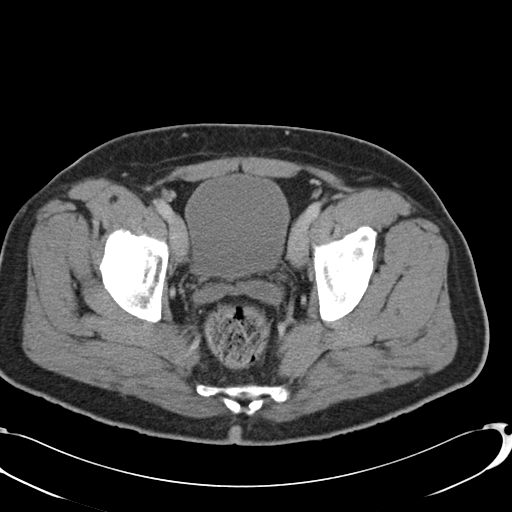
[im 27/98  soft-tissue]
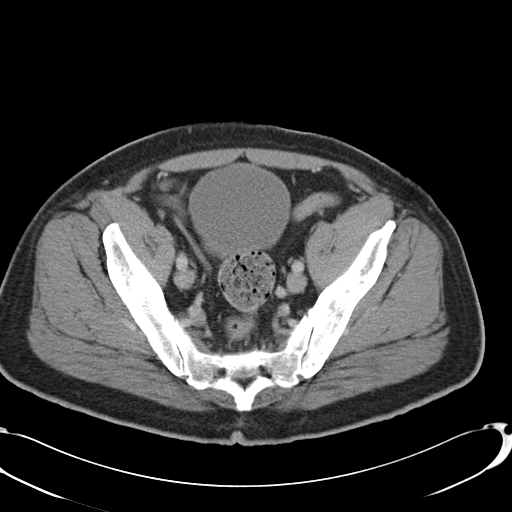
[im 33/98  soft-tissue]
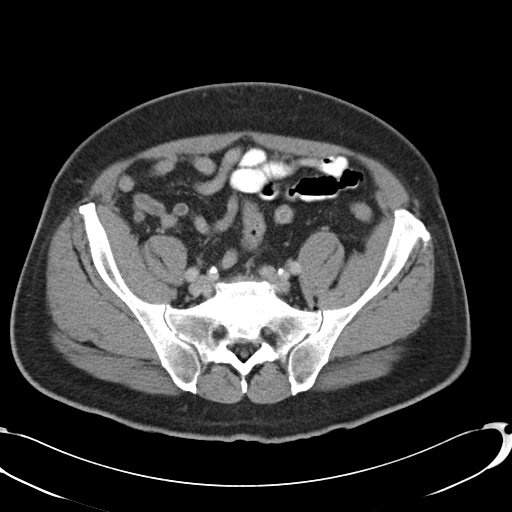
[im 44/98  soft-tissue]
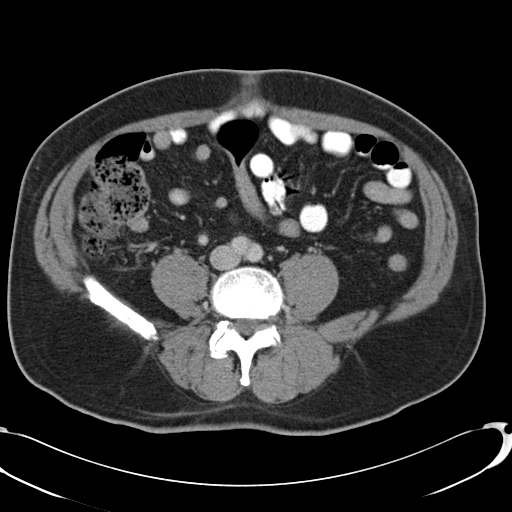
[im 49/98  soft-tissue]
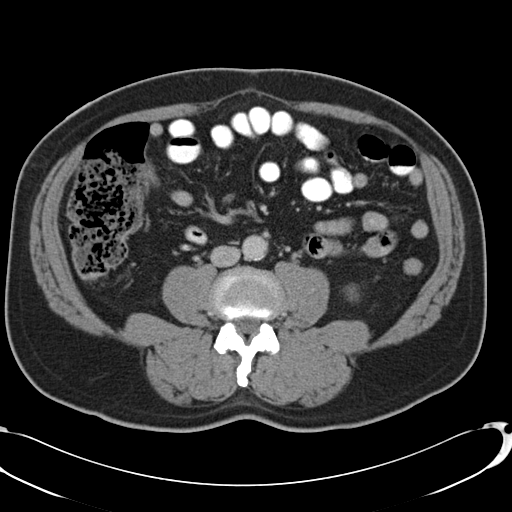
[im 54/98  soft-tissue]
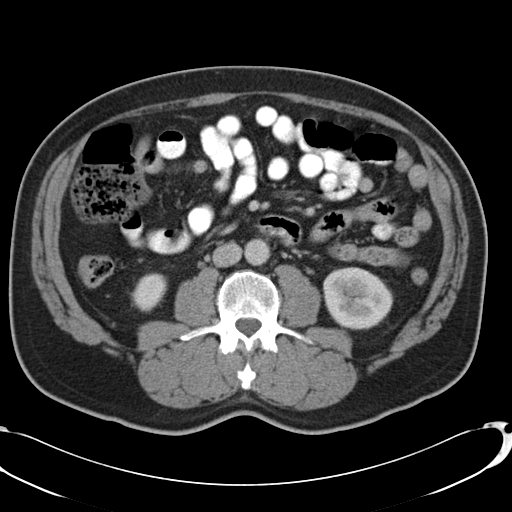
[im 65/98  soft-tissue]
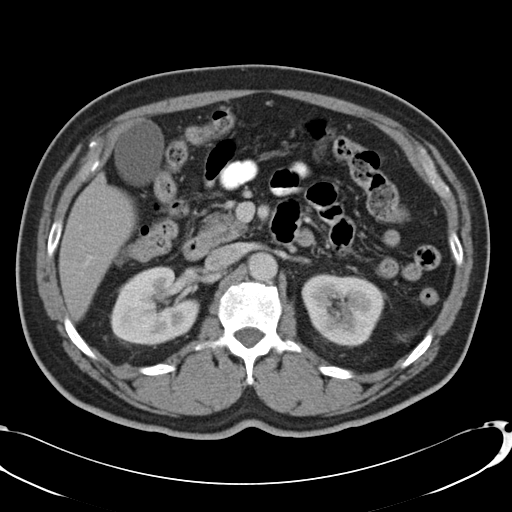
[im 65/98  bone]
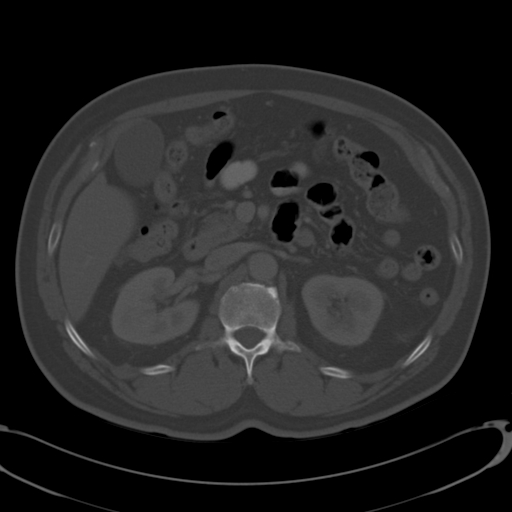
[im 71/98  soft-tissue]
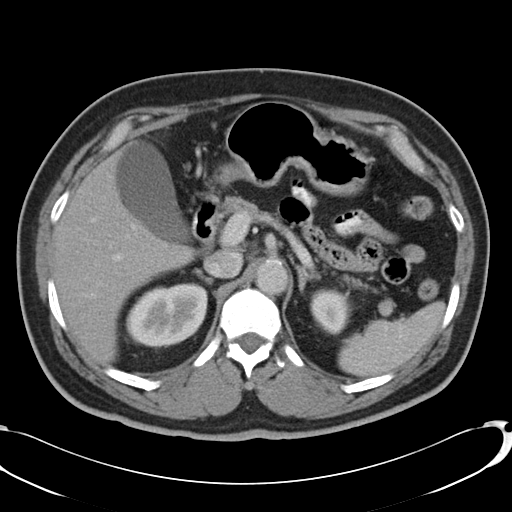
[im 76/98  soft-tissue]
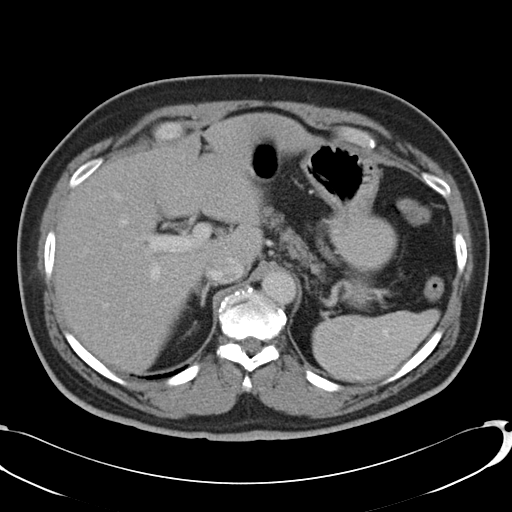
[im 87/98  soft-tissue]
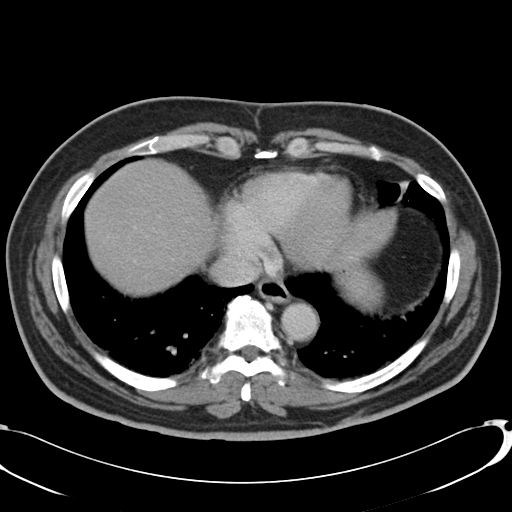
[im 92/98  soft-tissue]
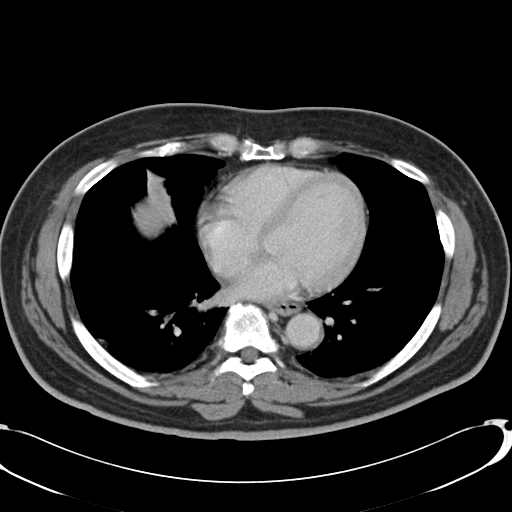

[Series 5: cor routine abd pel with · coronal · 0.71mm/px · 3 of 132 slices shown]
[im 44/132  soft-tissue]
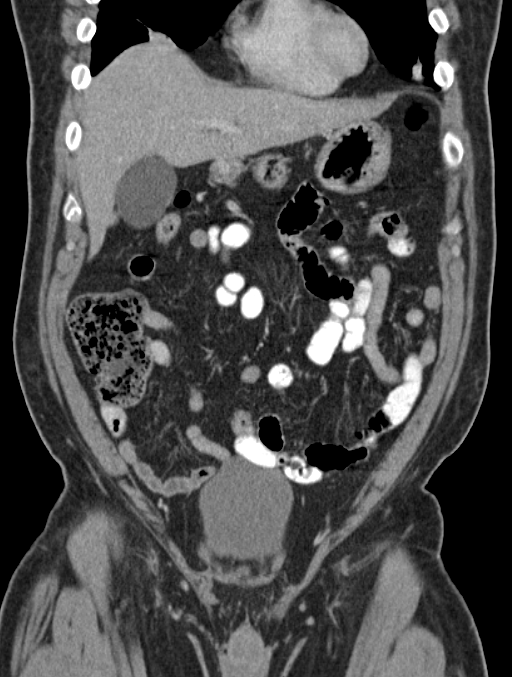
[im 59/132  soft-tissue]
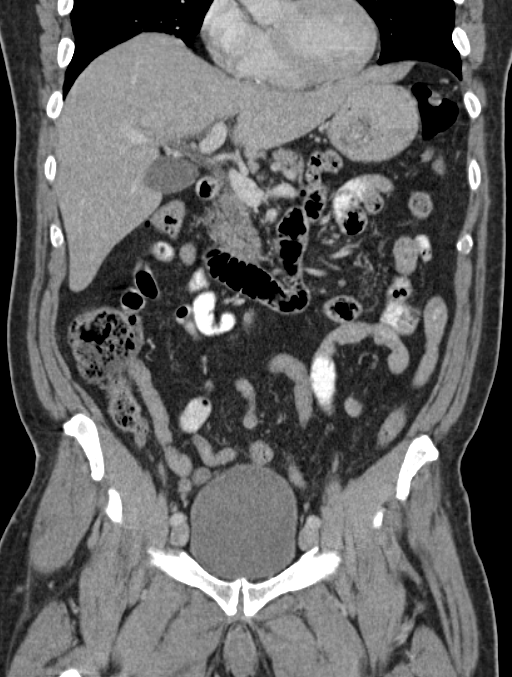
[im 73/132  soft-tissue]
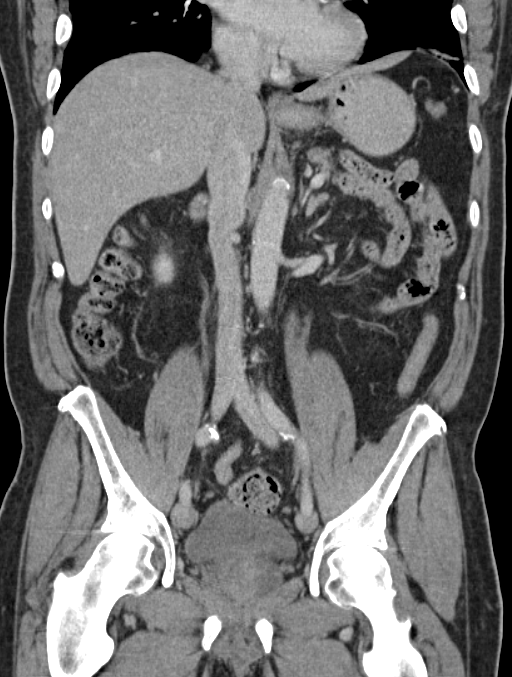

[16 of 46 positions shown; findings below may reference images not displayed]

FINDINGS: Lung bases demonstrate mild bibasilar atelectasis.

Abdominal images demonstrate a 1 cm hypodensity over the dome of the
liver likely a cyst. The spleen, pancreas, gallbladder and adrenal
glands are within normal.

Kidneys are normal in size without hydronephrosis or
nephrolithiasis. There is a 2.9 cm cyst over the upper pole left
kidney. Ureters are normal.

Very minimal calcified plaque over the abdominal aorta and iliac
arteries.

Stomach is normal. Small bowel is within normal. Appendix is normal.
Colon is within normal.

Mesentery is within normal without free fluid or inflammatory
change.

Small umbilical hernia containing only peritoneal fat.

Pelvic images demonstrate the bladder and prostate to be normal.
Mild fecal retention over the rectum.

Mild degenerate change of the spine. Minimal loss of mid to anterior
vertebral body height of T11 which is chronic.
IMPRESSION: No acute findings in the abdomen/pelvis.

2.9 cm left renal cyst.  1 cm liver cyst.

Mild loss of mid to anterior vertebral body of T11 unchanged.

## 2017-06-03 ENCOUNTER — Ambulatory Visit (INDEPENDENT_AMBULATORY_CARE_PROVIDER_SITE_OTHER): Payer: Medicare Other | Admitting: Family Medicine

## 2017-06-03 ENCOUNTER — Encounter: Payer: Self-pay | Admitting: Family Medicine

## 2017-06-03 NOTE — Progress Notes (Signed)
This encounter was created in error - please disregard.Name: William Parks   MRN: [id number]    DOB: [birth year]   Date:06/03/2017       Progress Note  Subjective  Chief Complaint  Chief Complaint  Patient presents with  . Follow-up  . Medication Refill    zantac    HPI  Pt. Presents for evaluation of stomach symtpoms. He has ongoing symptoms of reflux, he feels like he gets heartburn   Past Medical History:  Diagnosis Date  . Arthritis    rt shoulder  . Diabetes mellitus without complication (HCC)   . Hyperlipidemia associated with type 2 diabetes mellitus (HCC) 06/02/2015  . Hypertension     Past Surgical History:  Procedure Laterality Date  . CYST REMOVAL TRUNK    . SHOULDER SURGERY Right     Family History  Problem Relation Age of Onset  . Cancer Mother        Breast CA  . Diabetes Brother     Social History   Socioeconomic History  . Marital status: Divorced    Spouse name: Not on file  . Number of children: 3  . Years of education: Not on file  . Highest education level: 10th grade  Social Needs  . Financial resource strain: Not hard at all  . Food insecurity - worry: Never true  . Food insecurity - inability: Never true  . Transportation needs - medical: No  . Transportation needs - non-medical: No  Occupational History  . Occupation: Retired  Tobacco Use  . Smoking status: Light Tobacco Smoker    Packs/day: 0.25    Years: 50.00    Pack years: 12.50    Types: Cigarettes  . Smokeless tobacco: Never Used  . Tobacco comment: smoking 1-2 cigarettes a day  Substance and Sexual Activity  . Alcohol use: No    Alcohol/week: 0.0 oz  . Drug use: No  . Sexual activity: No  Other Topics Concern  . Not on file  Social History Narrative  . Not on file     Current Outpatient Medications:  .  aspirin 81 MG EC tablet, Take 81 mg by mouth daily., Disp: , Rfl: 5 .  atorvastatin (LIPITOR) 10 MG tablet, Take 1 tablet (10 mg total) by mouth daily at 6 PM.,  Disp: 90 tablet, Rfl: 1 .  Cholecalciferol (VITAMIN D3) 2000 units TABS, Take by mouth., Disp: , Rfl:  .  empagliflozin (JARDIANCE) 25 MG TABS tablet, Take 25 mg by mouth daily., Disp: 90 tablet, Rfl: 1 .  Insulin Glargine (LANTUS SOLOSTAR) 100 UNIT/ML Solostar Pen, INJECT 30 UNITS INTO THE SKIN 2 (TWO) TIMES DAILY., Disp: 5 pen, Rfl: 2 .  insulin lispro (HUMALOG KWIKPEN) 100 UNIT/ML KiwkPen, INJECT 10 UNITS SUBCUTANEOUSLY 3 TIMES ADAY, Disp: 15 pen, Rfl: 2 .  losartan (COZAAR) 50 MG tablet, TAKE 1 TABLET (50 MG TOTAL) BY MOUTH DAILY., Disp: 90 tablet, Rfl: 0 .  metoprolol tartrate (LOPRESSOR) 25 MG tablet, Take 1 tablet (25 mg total) by mouth 2 (two) times daily., Disp: 180 tablet, Rfl: 0 .  pregabalin (LYRICA) 150 MG capsule, Take 1 capsule (150 mg total) by mouth every morning., Disp: 90 capsule, Rfl: 0 .  triamterene-hydrochlorothiazide (MAXZIDE-25) 37.5-25 MG tablet, TAKE 1/2 TABLETS BY MOUTH DAILY., Disp: 90 tablet, Rfl: 0 .  ranitidine (ZANTAC) 150 MG tablet, Take 1 tablet (150 mg total) by mouth at bedtime. (Patient not taking: Reported on 04/27/2017), Disp: 30 tablet, Rfl: 1  No Known Allergies  ROS    Objective  Vitals:   06/03/17 0952  BP: 126/74  Pulse: 84  Resp: 16  Temp: 98.4 F (36.9 C)  TempSrc: Oral  SpO2: 95%  Weight: 196 lb 3.2 oz (89 kg)  Height: 5\' 8"  (1.727 m)    Physical Exam     Recent Results (from the past 2160 hour(s))  POCT Glucose (CBG)     Status: Abnormal   Collection Time: 04/27/17  9:07 AM  Result Value Ref Range   POC Glucose 117 (A) 70 - 99 mg/dl  POCT HgB [id number]     Status: Abnormal   Collection Time: 04/27/17  9:11 AM  Result Value Ref Range   Hemoglobin A1C 7.2      Assessment & Plan  There are no diagnoses linked to this encounter.  Garrit Marrow Asad A. Faylene KurtzShah [facility] 06/03/2017 9:58 AM

## 2017-06-09 ENCOUNTER — Encounter: Payer: Self-pay | Admitting: Family Medicine

## 2017-06-09 ENCOUNTER — Ambulatory Visit (INDEPENDENT_AMBULATORY_CARE_PROVIDER_SITE_OTHER): Payer: Medicare Other | Admitting: Family Medicine

## 2017-06-09 NOTE — Progress Notes (Signed)
This encounter was created in error - please disregard.Name: William Parks   MRN: [id number]    DOB: 1946-03-24   Date:06/09/2017       Progress Note  Subjective  Chief Complaint  Chief Complaint  Patient presents with  . Follow-up  . Diabetes  . Hypertension  . Hyperlipidemia    Diabetes  He presents for his follow-up diabetic visit. He has type 2 diabetes mellitus.  Hypertension   Hyperlipidemia       Past Medical History:  Diagnosis Date  . Arthritis    rt shoulder  . Diabetes mellitus without complication (HCC)   . Hyperlipidemia associated with type 2 diabetes mellitus (HCC) 06/02/2015  . Hypertension     Past Surgical History:  Procedure Laterality Date  . CYST REMOVAL TRUNK    . SHOULDER SURGERY Right     Family History  Problem Relation Age of Onset  . Cancer Mother        Breast CA  . Diabetes Brother     Social History   Socioeconomic History  . Marital status: Divorced    Spouse name: Not on file  . Number of children: 3  . Years of education: Not on file  . Highest education level: 10th grade  Social Needs  . Financial resource strain: Not hard at all  . Food insecurity - worry: Never true  . Food insecurity - inability: Never true  . Transportation needs - medical: No  . Transportation needs - non-medical: No  Occupational History  . Occupation: Retired  Tobacco Use  . Smoking status: Light Tobacco Smoker    Packs/day: 0.25    Years: 50.00    Pack years: 12.50    Types: Cigarettes  . Smokeless tobacco: Never Used  . Tobacco comment: smoking 1-2 cigarettes a day  Substance and Sexual Activity  . Alcohol use: No    Alcohol/week: 0.0 oz  . Drug use: No  . Sexual activity: No  Other Topics Concern  . Not on file  Social History Narrative  . Not on file     Current Outpatient Medications:  .  aspirin 81 MG EC tablet, Take 81 mg by mouth daily., Disp: , Rfl: 5 .  atorvastatin (LIPITOR) 10 MG tablet, Take 1 tablet (10 mg total) by  mouth daily at 6 PM., Disp: 90 tablet, Rfl: 1 .  Cholecalciferol (VITAMIN D3) 2000 units TABS, Take by mouth., Disp: , Rfl:  .  empagliflozin (JARDIANCE) 25 MG TABS tablet, Take 25 mg by mouth daily., Disp: 90 tablet, Rfl: 1 .  Insulin Glargine (LANTUS SOLOSTAR) 100 UNIT/ML Solostar Pen, INJECT 30 UNITS INTO THE SKIN 2 (TWO) TIMES DAILY., Disp: 5 pen, Rfl: 2 .  insulin lispro (HUMALOG KWIKPEN) 100 UNIT/ML KiwkPen, INJECT 10 UNITS SUBCUTANEOUSLY 3 TIMES ADAY, Disp: 15 pen, Rfl: 2 .  losartan (COZAAR) 50 MG tablet, TAKE 1 TABLET (50 MG TOTAL) BY MOUTH DAILY., Disp: 90 tablet, Rfl: 0 .  metoprolol tartrate (LOPRESSOR) 25 MG tablet, Take 1 tablet (25 mg total) by mouth 2 (two) times daily., Disp: 180 tablet, Rfl: 0 .  pregabalin (LYRICA) 150 MG capsule, Take 1 capsule (150 mg total) by mouth every morning., Disp: 90 capsule, Rfl: 0 .  triamterene-hydrochlorothiazide (MAXZIDE-25) 37.5-25 MG tablet, TAKE 1/2 TABLETS BY MOUTH DAILY., Disp: 90 tablet, Rfl: 0 .  ranitidine (ZANTAC) 150 MG tablet, Take 1 tablet (150 mg total) by mouth at bedtime. (Patient not taking: Reported on 04/27/2017), Disp: 30 tablet, Rfl: 1  No Known Allergies   ROS    Objective  Vitals:   06/09/17 1027  BP: 130/76  Pulse: 75  Resp: 14  Temp: 98.2 F (36.8 C)  TempSrc: Oral  SpO2: 97%  Weight: 195 lb 1.6 oz (88.5 kg)    Physical Exam     Recent Results (from the past 2160 hour(s))  POCT Glucose (CBG)     Status: Abnormal   Collection Time: 04/27/17  9:07 AM  Result Value Ref Range   POC Glucose 117 (A) 70 - 99 mg/dl  POCT HgB [id number]     Status: Abnormal   Collection Time: 04/27/17  9:11 AM  Result Value Ref Range   Hemoglobin A1C 7.2      Assessment & Plan  There are no diagnoses linked to this encounter.  William Parks Asad A. Faylene KurtzShah [facility] 06/09/2017 10:53 AM

## 2017-06-19 ENCOUNTER — Other Ambulatory Visit: Payer: Self-pay | Admitting: Family Medicine

## 2017-07-20 ENCOUNTER — Other Ambulatory Visit: Payer: Self-pay

## 2017-07-20 DIAGNOSIS — I10 Essential (primary) hypertension: Secondary | ICD-10-CM

## 2017-07-20 MED ORDER — LOSARTAN POTASSIUM 50 MG PO TABS: ORAL_TABLET | ORAL | 0 refills | Status: DC

## 2017-07-20 NOTE — Telephone Encounter (Signed)
Refill request for Hypertension medication:  Losartan 50 mg  Last office visit pertaining to hypertension: 04/27/2017  BP Readings from Last 3 Encounters:  06/09/17 130/76  06/03/17 126/74  05/03/17 124/70     Lab Results  Component Value Date   CREATININE 1.14 01/25/2017   BUN 18 01/25/2017   NA 141 01/25/2017   K 4.1 01/25/2017   CL 103 01/25/2017   CO2 29 01/25/2017    Follow-ups on file. 07/26/2017

## 2017-07-26 ENCOUNTER — Ambulatory Visit (INDEPENDENT_AMBULATORY_CARE_PROVIDER_SITE_OTHER): Payer: Medicare Other | Admitting: Family Medicine

## 2017-07-26 ENCOUNTER — Encounter: Payer: Self-pay | Admitting: Family Medicine

## 2017-07-26 ENCOUNTER — Ambulatory Visit: Payer: Medicare Other | Admitting: Family Medicine

## 2017-07-26 VITALS — BP 118/76 | HR 75 | Temp 98.0°F | Resp 16 | Ht 68.0 in | Wt 195.4 lb

## 2017-07-26 DIAGNOSIS — I10 Essential (primary) hypertension: Secondary | ICD-10-CM

## 2017-07-26 DIAGNOSIS — E1142 Type 2 diabetes mellitus with diabetic polyneuropathy: Secondary | ICD-10-CM | POA: Diagnosis not present

## 2017-07-26 DIAGNOSIS — E559 Vitamin D deficiency, unspecified: Secondary | ICD-10-CM

## 2017-07-26 DIAGNOSIS — Z716 Tobacco abuse counseling: Secondary | ICD-10-CM

## 2017-07-26 DIAGNOSIS — E785 Hyperlipidemia, unspecified: Secondary | ICD-10-CM | POA: Diagnosis not present

## 2017-07-26 LAB — POCT GLYCOSYLATED HEMOGLOBIN (HGB A1C): Hemoglobin A1C: 7.3

## 2017-07-26 MED ORDER — EMPAGLIFLOZIN 25 MG PO TABS: 25.0000 mg | ORAL_TABLET | Freq: Every day | ORAL | 1 refills | Status: DC

## 2017-07-26 MED ORDER — INSULIN PEN NEEDLE 32G X 6 MM MISC: 5.0000 | Freq: Every day | 2 refills | Status: DC

## 2017-07-26 MED ORDER — ATORVASTATIN CALCIUM 10 MG PO TABS: 10.0000 mg | ORAL_TABLET | Freq: Every day | ORAL | 1 refills | Status: DC

## 2017-07-26 MED ORDER — LOSARTAN POTASSIUM 50 MG PO TABS: ORAL_TABLET | ORAL | 1 refills | Status: DC

## 2017-07-26 MED ORDER — METOPROLOL TARTRATE 25 MG PO TABS: 25.0000 mg | ORAL_TABLET | Freq: Two times a day (BID) | ORAL | 1 refills | Status: DC

## 2017-07-26 MED ORDER — PREGABALIN 150 MG PO CAPS: 150.0000 mg | ORAL_CAPSULE | Freq: Every morning | ORAL | 1 refills | Status: DC

## 2017-07-26 MED ORDER — INSULIN GLARGINE-LIXISENATIDE 100-33 UNT-MCG/ML ~~LOC~~ SOPN: 10.0000 [IU] | PEN_INJECTOR | Freq: Every day | SUBCUTANEOUS | 0 refills | Status: DC

## 2017-07-26 NOTE — Patient Instructions (Signed)
Start [place] in place of lantus You will start at lower dose at 10 units and go up to 2 units every two days to maximum of 60 units daily This medication will control fasting - should be between 90-140 and hopefully keep after meals level below 180 With time we will wean you off pre-meal insulin  Stop lantus at night only when you get to 30 units of Soliqua in the mornings.   Return in one week for bp check and also bring the insulins with you to let us know how you are taking.
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
## 2017-08-02 ENCOUNTER — Ambulatory Visit: Payer: Medicare Other

## 2017-08-02 VITALS — BP 130/68

## 2017-08-02 DIAGNOSIS — I10 Essential (primary) hypertension: Secondary | ICD-10-CM

## 2017-08-02 NOTE — Patient Instructions (Addendum)
New Directions for Insulin:  OK, So we will do Soliqua insulin injection in am 15 units in the morning for the next week.   Humalin 10 units with Dinner Lantus 30 units at bedtime  Please be sure to document all sugars and come back in for nurse visit next wed.  To adjust insulin further as needed.

## 2017-08-11 ENCOUNTER — Ambulatory Visit (INDEPENDENT_AMBULATORY_CARE_PROVIDER_SITE_OTHER): Payer: Medicare Other

## 2017-08-11 VITALS — BP 160/100 | HR 73 | Ht 68.0 in | Wt 195.0 lb

## 2017-08-11 DIAGNOSIS — E1142 Type 2 diabetes mellitus with diabetic polyneuropathy: Secondary | ICD-10-CM

## 2017-08-11 DIAGNOSIS — E1165 Type 2 diabetes mellitus with hyperglycemia: Secondary | ICD-10-CM

## 2017-08-11 DIAGNOSIS — IMO0002 Reserved for concepts with insufficient information to code with codable children: Secondary | ICD-10-CM

## 2017-08-11 NOTE — Progress Notes (Signed)
Patient came in for insulin and blood sugar check. His insulin has been running in the range of 68-106 fasting. His last A1c was 7.3. He continues to take his insulin as prescribed. His insulin was adjusted by PCP today.   She d/c his Lantus. Humalog only taking daily with dinner. Soliqua inject 20 ml into skin daily. Follow up in one week for nurse visit to recheck insulin and bs. Continue to keep bs log and bp log.

## 2017-08-18 ENCOUNTER — Ambulatory Visit: Payer: Medicare Other

## 2017-08-18 VITALS — BP 140/72 | HR 69 | Resp 18 | Ht 68.0 in | Wt 194.1 lb

## 2017-08-18 DIAGNOSIS — I10 Essential (primary) hypertension: Secondary | ICD-10-CM

## 2017-08-18 NOTE — Progress Notes (Signed)
Patient is here for a blood pressure check. Patient denies chest pain, palpitations, shortness of breath or visual disturbances. At previous visit blood pressure was 118/76 with a heart rate of 75. Today during nurse visit first check blood pressure was 142/70. After resting for 10 minutes it was 140/72 and heart rate was 69. He does take any blood pressure medications.  Patient has right side head pain discussed with Dr. Carlynn PurlSowles and recommend patient to stay on - losartan (COZAAR) 50 MG tablet; TAKE 1 TABLET (50 MG TOTAL) BY MOUTH DAILY.  Dispense: 90 tablet; Refill: 1 - metoprolol tartrate (LOPRESSOR) 25 MG tablet; Take 1 tablet (25 mg total) by mouth 2 (two) times daily.  Dispense: 180 tablet; Refill: 1  BP Systolic within 150-140 is a good BP for him and before 118 was too low causing dizziness. Patient notified and will keep follow up appt.

## 2017-08-22 ENCOUNTER — Other Ambulatory Visit: Payer: Self-pay | Admitting: Family Medicine

## 2017-08-22 ENCOUNTER — Telehealth: Payer: Self-pay

## 2017-08-22 NOTE — Progress Notes (Unsigned)
Spoke to patient, advised to stop lantus in pm and recheck with nurse in one week

## 2017-08-22 NOTE — Telephone Encounter (Signed)
Patient came in today for advise on how to use his [place] pen. He was suppose to titrate up to 20 units. Since he did not know how to move the pen up to 20 units he continued to stay at 15 units once daily. I showed him how to move it up and he will start 20 units today. He reports that his fasting blood sugars have been under 100 no higher than 83. He continues to take Humalog once daily with dinner and he has stopped taking his Lantus as recommended. He came in last week for his nurse visit for his bs, but only a bp nurse visit was done. Please advise what the next step he should take for his bs. He has an appointment scheduled in July to follow up. Would you like him to follow up as a Nurse visit for his bs before then.

## 2017-08-22 NOTE — Telephone Encounter (Signed)
Advised patient to return in one week for follow up, stop lantus in pm, take soliqua 20 units daily and pre meal insulin. Nurse visit only in one week

## 2017-08-24 ENCOUNTER — Other Ambulatory Visit: Payer: Self-pay | Admitting: Family Medicine

## 2017-08-24 DIAGNOSIS — E1142 Type 2 diabetes mellitus with diabetic polyneuropathy: Secondary | ICD-10-CM

## 2017-08-25 ENCOUNTER — Ambulatory Visit: Payer: Medicare Other

## 2017-08-25 VITALS — BP 130/80

## 2017-08-25 DIAGNOSIS — E1142 Type 2 diabetes mellitus with diabetic polyneuropathy: Secondary | ICD-10-CM

## 2017-08-25 DIAGNOSIS — IMO0002 Reserved for concepts with insufficient information to code with codable children: Secondary | ICD-10-CM

## 2017-08-25 DIAGNOSIS — I10 Essential (primary) hypertension: Secondary | ICD-10-CM

## 2017-08-25 DIAGNOSIS — E1165 Type 2 diabetes mellitus with hyperglycemia: Secondary | ICD-10-CM

## 2017-08-30 ENCOUNTER — Other Ambulatory Visit: Payer: Self-pay | Admitting: Family Medicine

## 2017-09-18 ENCOUNTER — Other Ambulatory Visit: Payer: Self-pay | Admitting: Family Medicine

## 2017-09-18 DIAGNOSIS — E1142 Type 2 diabetes mellitus with diabetic polyneuropathy: Secondary | ICD-10-CM

## 2017-10-06 ENCOUNTER — Encounter: Payer: Self-pay | Admitting: Nurse Practitioner

## 2017-10-06 ENCOUNTER — Telehealth: Payer: Self-pay | Admitting: Nurse Practitioner

## 2017-10-06 ENCOUNTER — Other Ambulatory Visit: Payer: Self-pay | Admitting: Family Medicine

## 2017-10-06 ENCOUNTER — Telehealth: Payer: Self-pay

## 2017-10-06 ENCOUNTER — Ambulatory Visit (INDEPENDENT_AMBULATORY_CARE_PROVIDER_SITE_OTHER): Payer: Medicare Other | Admitting: Nurse Practitioner

## 2017-10-06 VITALS — BP 130/84 | HR 84 | Temp 98.2°F | Resp 18 | Ht 68.0 in | Wt 190.1 lb

## 2017-10-06 DIAGNOSIS — IMO0002 Reserved for concepts with insufficient information to code with codable children: Secondary | ICD-10-CM

## 2017-10-06 DIAGNOSIS — R21 Rash and other nonspecific skin eruption: Secondary | ICD-10-CM

## 2017-10-06 DIAGNOSIS — E1142 Type 2 diabetes mellitus with diabetic polyneuropathy: Secondary | ICD-10-CM

## 2017-10-06 DIAGNOSIS — E1165 Type 2 diabetes mellitus with hyperglycemia: Secondary | ICD-10-CM

## 2017-10-06 DIAGNOSIS — Z1211 Encounter for screening for malignant neoplasm of colon: Secondary | ICD-10-CM | POA: Diagnosis not present

## 2017-10-06 MED ORDER — INSULIN GLARGINE 100 UNIT/ML ~~LOC~~ SOLN: 20.0000 [IU] | Freq: Every day | SUBCUTANEOUS | 2 refills | Status: DC

## 2017-10-06 MED ORDER — METFORMIN HCL ER 750 MG PO TB24: 750.0000 mg | ORAL_TABLET | Freq: Two times a day (BID) | ORAL | 1 refills | Status: DC

## 2017-10-06 MED ORDER — PREGABALIN 150 MG PO CAPS: 150.0000 mg | ORAL_CAPSULE | Freq: Every morning | ORAL | 1 refills | Status: DC

## 2017-10-06 NOTE — Telephone Encounter (Signed)
Please review the following orders with patient again: _______ - Since you are no longer taking the [facility], please start taking metformin once a day for the first week and then twice a day following that.  - Start your lantus at 20 units at bedtime We will determine your evening dose of lantus based on your fasting (pre-breakfast) blood sugar. Our morning blood sugar goal is 70-170 mg/dL  If your morning blood sugar value is:  . 60-70 mg/dl= decrease your lantus dose by one unit ( can titrate daily) . < 60 mg/dl = decrease your lantus dose by two units (can titrate daily)   . 180-240 mg/dl = increase your lantus dose by two unit and do not increase your dose again for 2 days . >240mg /dl= increase your lantus dose by three units and do not increase your dose again for 2 days

## 2017-10-06 NOTE — Progress Notes (Addendum)
Name: William Parks   MRN: [id number]    DOB: 02/18/1946   Date:10/06/2017       Progress Note  Subjective  Chief Complaint  Chief Complaint  Patient presents with  . Rash  . Allergic Reaction    from Soliqua insulin    HPI  Patient states started taking Soliqua last month and believes it is causing a rash in his groin area that has been ongoing for 2-3 weeks. Patient stopped taking soliqua on Sunday. States hasn't noticed a difference in the rash. Patient has been using OTC 3-in1 ointment doesn't recall the name. States has been eating better and checking his blood sugars ranging from 137-180 fasting the last month, states 160's since being off this medication. States also notes increased headaches since starting this medication.  He states he has redness around the head of penis and shaft states small bumps, denies discharge, endorses mild pain- sometimes. Denies sexual activity. Patient smokes 1-2 ciggs a day. No ulcers per pt.    Patient Active Problem List   Diagnosis Date Noted  . Neck and shoulder pain 11/08/2016  . Tobacco abuse counseling 10/25/2016  . Vitamin D deficiency 10/15/2015  . Dyslipidemia 06/02/2015  . Hypertension 04/01/2015  . Uncontrolled type 2 diabetes mellitus with peripheral neuropathy (HCC) 04/01/2015    Past Medical History:  Diagnosis Date  . Arthritis    rt shoulder  . Diabetes mellitus without complication (HCC)   . Hyperlipidemia associated with type 2 diabetes mellitus (HCC) 06/02/2015  . Hypertension     Past Surgical History:  Procedure Laterality Date  . CYST REMOVAL TRUNK     2x  . SHOULDER SURGERY Right 2009   rotator cuff surgery    Social History   Tobacco Use  . Smoking status: Light Tobacco Smoker    Packs/day: 0.25    Years: 50.00    Pack years: 12.50    Types: Cigarettes    Start date: 07/27/1967  . Smokeless tobacco: Never Used  . Tobacco comment: smoking 1-2 cigarettes a day  Substance Use Topics  . Alcohol use: No     Alcohol/week: 0.0 oz     Current Outpatient Medications:  .  aspirin 81 MG EC tablet, Take 81 mg by mouth daily., Disp: , Rfl: 5 .  atorvastatin (LIPITOR) 10 MG tablet, Take 1 tablet (10 mg total) by mouth daily at 6 PM., Disp: 90 tablet, Rfl: 1 .  Cholecalciferol (VITAMIN D3) 2000 units TABS, Take by mouth., Disp: , Rfl:  .  docusate sodium (COLACE) 100 MG capsule, Take 100 mg by mouth daily., Disp: , Rfl:  .  empagliflozin (JARDIANCE) 25 MG TABS tablet, Take 25 mg by mouth daily., Disp: 90 tablet, Rfl: 1 .  insulin lispro (HUMALOG KWIKPEN) 100 UNIT/ML KiwkPen, INJECT 10 UNITS SUBCUTANEOUSLY 3 TIMES ADAY (Patient taking differently: Inject 10 Units into the skin at bedtime. INJECT 10 UNITS SUBCUTANEOUSLY 3 TIMES ADAY), Disp: 15 pen, Rfl: 2 .  Insulin Pen Needle 32G X 6 MM MISC, Inject 5 each into the skin daily. Dx: Well controlled type 2 diabetes mellitus with peripheral neuropathy  E11.42, Disp: 200 each, Rfl: 2 .  losartan (COZAAR) 50 MG tablet, TAKE 1 TABLET (50 MG TOTAL) BY MOUTH DAILY., Disp: 90 tablet, Rfl: 1 .  metoprolol tartrate (LOPRESSOR) 25 MG tablet, Take 1 tablet (25 mg total) by mouth 2 (two) times daily., Disp: 180 tablet, Rfl: 1 .  pregabalin (LYRICA) 150 MG capsule, Take 1 capsule (150 mg total)  by mouth every morning., Disp: 90 capsule, Rfl: 1 .  raNITIdine HCl (RANITIDINE 75 PO), Take 1 tablet by mouth daily., Disp: , Rfl:  .  SOLIQUA 100-33 UNT-MCG/ML SOPN, INJECT 20-60 UNITS INTO THE SKIN DAILY. (Patient not taking: Reported on 10/06/2017), Disp: 15 mL, Rfl: 0  No Known Allergies  ROS   No other specific complaints in a complete review of systems (except as listed in HPI above).  Objective  Vitals:   10/06/17 0937  BP: 130/84  Pulse: 84  Resp: 18  Temp: 98.2 F (36.8 C)  TempSrc: Oral  SpO2: 98%  Weight: 190 lb 1.6 oz (86.2 kg)  Height: 5\' 8"  (1.727 m)     Body mass index is 28.9 kg/m.  Nursing Note and Vital Signs reviewed.  Physical  Exam   Constitutional: Patient appears well-developed and well-nourished.  No distress.  Cardiovascular: Normal rate, regular rhythm, S1/S2 present.  Pulses intact, no carotid bruits Pulmonary/Chest: Effort normal and breath sounds clear. No respiratory distress or retractions. Abdominal: Soft and non-tender, bowel sounds present  GU/rectal/Skin: Patient vehemently refusing NP or Dr. Carlynn Purl to see rash- requesting male provider Psychiatric: Patient has a normal mood and affect. behavior is normal. Judgment and thought content normal.  No results found for this or any previous visit (from the past 72 hour(s)).  Assessment & Plan  1. Rash of penis Pt refusing to allow male practitioner or MD to assess rash. No male providers at this office, informed of risks and benefits and possible wait time to see male dermatologist. Pt still refusing physical exam.  - Ambulatory referral to Dermatology  2. Uncontrolled type 2 diabetes mellitus with peripheral neuropathy (HCC) -Rash unlikely caused by soliqua but patient would like to go back to his lantus, disscussed starting low titrating as needed and starting on metformin. Follow up in 1 month. Discussed diet - insulin glargine (LANTUS) 100 UNIT/ML injection; Inject 0.2 mLs (20 Units total) into the skin at bedtime.  Dispense: 10 mL; Refill: 2 - metFORMIN (GLUCOPHAGE-XR) 750 MG 24 hr tablet; Take 1 tablet (750 mg total) by mouth 2 (two) times daily.  Dispense: 60 tablet; Refill: 1  3. Screening for colon cancer -pt endorses personal history of blood in stools recommend colonoscopy instead of cologuard for screening.  - Ambulatory referral to Gastroenterology  Face-to-face time with patient was more than 25 minutes, >50% time spent counseling and coordination of care -Red flags and when to present for emergency care or RTC including fever >101.57F, chest pain, shortness of breath, new/worsening/un-resolving symptoms,  reviewed with patient at time of  visit. Follow up and care instructions discussed and provided in AVS. -Reviewed Health Maintenance: refer for colonoscopy   I have reviewed this encounter including the documentation in this note and/or discussed this patient with the provider, Sharyon Cable DNP AGNP-C. I am certifying that I agree with the content of this note as supervising physician. Alba Cory, MD [facility] 10/06/2017, 3:52 PM

## 2017-10-06 NOTE — Patient Instructions (Addendum)
- Can try Clotrimazole cream for rash (OTC), see dermatologist, If willing to let us see rash can set up appointment - If unwilling to stay on soliqua, can go back to regular insulin and start new oral medication metformin- take twice a day, can start taking once a day for one week and then twice a day from then on to prevent nausea, diarrhea which are normal side effects that resolve within the first month of taking typically - Follow up in one month for diabetes.  - Please see eye doctor - Sent referral to GI for colonoscopy     Diabetes Mellitus and Nutrition When you have diabetes (diabetes mellitus), it is very important to have healthy eating habits because your blood sugar (glucose) levels are greatly affected by what you eat and drink. Eating healthy foods in the appropriate amounts, at about the same times every day, can help you:  Control your blood glucose.  Lower your risk of heart disease.  Improve your blood pressure.  Reach or maintain a healthy weight.  Every person with diabetes is different, and each person has different needs for a meal plan. Your health care provider may recommend that you work with a diet and nutrition specialist (dietitian) to make a meal plan that is best for you. Your meal plan may vary depending on factors such as:  The calories you need.  The medicines you take.  Your weight.  Your blood glucose, blood pressure, and cholesterol levels.  Your activity level.  Other health conditions you have, such as heart or kidney disease.  How do carbohydrates affect me? Carbohydrates affect your blood glucose level more than any other type of food. Eating carbohydrates naturally increases the amount of glucose in your blood. Carbohydrate counting is a method for keeping track of how many carbohydrates you eat. Counting carbohydrates is important to keep your blood glucose at a healthy level, especially if you use insulin or take certain oral diabetes  medicines. It is important to know how many carbohydrates you can safely have in each meal. This is different for every person. Your dietitian can help you calculate how many carbohydrates you should have at each meal and for snack. Foods that contain carbohydrates include:  Bread, cereal, rice, pasta, and crackers.  Potatoes and corn.  Peas, beans, and lentils.  Milk and yogurt.  Fruit and juice.  Desserts, such as cakes, cookies, ice cream, and candy.  How does alcohol affect me? Alcohol can cause a sudden decrease in blood glucose (hypoglycemia), especially if you use insulin or take certain oral diabetes medicines. Hypoglycemia can be a life-threatening condition. Symptoms of hypoglycemia (sleepiness, dizziness, and confusion) are similar to symptoms of having too much alcohol. If your health care provider says that alcohol is safe for you, follow these guidelines:  Limit alcohol intake to no more than 1 drink per day for nonpregnant women and 2 drinks per day for men. One drink equals 12 oz of beer, 5 oz of wine, or 1 oz of hard liquor.  Do not drink on an empty stomach.  Keep yourself hydrated with water, diet soda, or unsweetened iced tea.  Keep in mind that regular soda, juice, and other mixers may contain a lot of sugar and must be counted as carbohydrates.  What are tips for following this plan? Reading food labels  Start by checking the serving size on the label. The amount of calories, carbohydrates, fats, and other nutrients listed on the label are based on  one serving of the food. Many foods contain more than one serving per package.  Check the total grams (g) of carbohydrates in one serving. You can calculate the number of servings of carbohydrates in one serving by dividing the total carbohydrates by 15. For example, if a food has 30 g of total carbohydrates, it would be equal to 2 servings of carbohydrates.  Check the number of grams (g) of saturated and trans  fats in one serving. Choose foods that have low or no amount of these fats.  Check the number of milligrams (mg) of sodium in one serving. Most people should limit total sodium intake to less than 2,300 mg per day.  Always check the nutrition information of foods labeled as "low-fat" or "nonfat". These foods may be higher in added sugar or refined carbohydrates and should be avoided.  Talk to your dietitian to identify your daily goals for nutrients listed on the label. Shopping  Avoid buying canned, premade, or processed foods. These foods tend to be high in fat, sodium, and added sugar.  Shop around the outside edge of the grocery store. This includes fresh fruits and vegetables, bulk grains, fresh meats, and fresh dairy. Cooking  Use low-heat cooking methods, such as baking, instead of high-heat cooking methods like deep frying.  Cook using healthy oils, such as olive, canola, or sunflower oil.  Avoid cooking with butter, cream, or high-fat meats. Meal planning  Eat meals and snacks regularly, preferably at the same times every day. Avoid going long periods of time without eating.  Eat foods high in fiber, such as fresh fruits, vegetables, beans, and whole grains. Talk to your dietitian about how many servings of carbohydrates you can eat at each meal.  Eat 4-6 ounces of lean protein each day, such as lean meat, chicken, fish, eggs, or tofu. 1 ounce is equal to 1 ounce of meat, chicken, or fish, 1 egg, or 1/4 cup of tofu.  Eat some foods each day that contain healthy fats, such as avocado, nuts, seeds, and fish. Lifestyle   Check your blood glucose regularly.  Exercise at least 30 minutes 5 or more days each week, or as told by your health care provider.  Take medicines as told by your health care provider.  Do not use any products that contain nicotine or tobacco, such as cigarettes and e-cigarettes. If you need help quitting, ask your health care provider.  Work with a  Social worker or diabetes educator to identify strategies to manage stress and any emotional and social challenges. What are some questions to ask my health care provider?  Do I need to meet with a diabetes educator?  Do I need to meet with a dietitian?  What number can I call if I have questions?  When are the best times to check my blood glucose? Where to find more information:  American Diabetes Association: diabetes.org/food-and-fitness/food  Academy of Nutrition and Dietetics: PokerClues.dk  [employer] of Diabetes and Digestive and Kidney Diseases (NIH): ContactWire.be Summary  A healthy meal plan will help you control your blood glucose and maintain a healthy lifestyle.  Working with a diet and nutrition specialist (dietitian) can help you make a meal plan that is best for you.  Keep in mind that carbohydrates and alcohol have immediate effects on your blood glucose levels. It is important to count carbohydrates and to use alcohol carefully. This information is not intended to replace advice given to you by your health care provider. Make sure you  discuss any questions you have with your health care provider. Document Released: 01/07/2005 Document Revised: 05/17/2016 Document Reviewed: 05/17/2016 Elsevier Interactive Patient Education  [employer].

## 2017-10-06 NOTE — Telephone Encounter (Signed)
[facility] that they got a blank prescription from [facility] for Lyrica without a signature. He stated that they in turned sent a fax to our office and it was sent back to them blank as well.  He stated that it was a little suspicious and that they will NOT be filling that prescription.

## 2017-10-07 NOTE — Telephone Encounter (Signed)
Left message for patient to call.

## 2017-10-09 ENCOUNTER — Other Ambulatory Visit: Payer: Self-pay | Admitting: Family Medicine

## 2017-10-12 ENCOUNTER — Other Ambulatory Visit: Payer: Self-pay | Admitting: Family Medicine

## 2017-10-12 DIAGNOSIS — E1142 Type 2 diabetes mellitus with diabetic polyneuropathy: Secondary | ICD-10-CM

## 2017-10-13 NOTE — Telephone Encounter (Signed)
Called patient left message with wife for him to call office

## 2017-10-31 ENCOUNTER — Other Ambulatory Visit: Payer: Self-pay | Admitting: Nurse Practitioner

## 2017-10-31 DIAGNOSIS — IMO0002 Reserved for concepts with insufficient information to code with codable children: Secondary | ICD-10-CM

## 2017-10-31 DIAGNOSIS — E1142 Type 2 diabetes mellitus with diabetic polyneuropathy: Secondary | ICD-10-CM

## 2017-10-31 DIAGNOSIS — E1165 Type 2 diabetes mellitus with hyperglycemia: Secondary | ICD-10-CM

## 2017-11-03 ENCOUNTER — Ambulatory Visit (INDEPENDENT_AMBULATORY_CARE_PROVIDER_SITE_OTHER): Payer: Medicare Other | Admitting: Family Medicine

## 2017-11-03 ENCOUNTER — Encounter: Payer: Self-pay | Admitting: Family Medicine

## 2017-11-03 VITALS — BP 120/84 | HR 83 | Temp 98.3°F | Resp 18 | Ht 68.0 in | Wt 190.5 lb

## 2017-11-03 DIAGNOSIS — E785 Hyperlipidemia, unspecified: Secondary | ICD-10-CM

## 2017-11-03 DIAGNOSIS — E559 Vitamin D deficiency, unspecified: Secondary | ICD-10-CM

## 2017-11-03 DIAGNOSIS — Z716 Tobacco abuse counseling: Secondary | ICD-10-CM

## 2017-11-03 DIAGNOSIS — E1169 Type 2 diabetes mellitus with other specified complication: Secondary | ICD-10-CM

## 2017-11-03 DIAGNOSIS — I10 Essential (primary) hypertension: Secondary | ICD-10-CM | POA: Diagnosis not present

## 2017-11-03 DIAGNOSIS — E1142 Type 2 diabetes mellitus with diabetic polyneuropathy: Secondary | ICD-10-CM | POA: Diagnosis not present

## 2017-11-03 LAB — POCT GLYCOSYLATED HEMOGLOBIN (HGB A1C): Hemoglobin A1C: 6.9 % — AB (ref 4.0–5.6)

## 2017-11-03 MED ORDER — METFORMIN HCL ER 750 MG PO TB24: 750.0000 mg | ORAL_TABLET | Freq: Two times a day (BID) | ORAL | 1 refills | Status: DC

## 2017-11-03 NOTE — Progress Notes (Signed)
Name: William Parks   MRN: [id number]    DOB: 11/20/1945   Date:11/03/2017       Progress Note  Subjective  Chief Complaint  Chief Complaint  Patient presents with  . Diabetes    3 month recheck  . Hypertension    HPI  DMII: he is on taking Jardiance, Metformin Lantus, and pre-meal insulin . He states he has been giving himself 5 shots per day.He has been trying to cut down on bread. His hgbA1C is at goal for him today it is 6.9% last visit 7.3%  He will schedule his eye exam. He denies polyphagia, polydipsia or polyuria. He could not tolerate Soliqua and is back on Lantus and pre-meal insulin . He states Lyrica works well for his neuropathy.   HTN:  We stopped trimateren.hctz and he is doing well , bp is at goal, no chest pain or palpitation.   Dyslipidemia: he takes medications as prescribed, due for repeat labs prior to next visit, he does not want to have labs done today. No myalgia.   Tobacco use: discussed importance of quitting, he states he does not have daily cough or SOB, discussed spirometry but he wants to hold off for now. Also discussed lung cancer screen, he is not interested at this time. He is down to 2 cigarettes daily and can just stop smoking when ready.   Hemorrhoids: he states he has not been able to do cologuard because he has blood when he wipes and has seen hemorrhoids. Advised colonoscopy to make sure bleeding is from hemorrhoids but he refuses at this time, he is willing to try cologuard and if positive he will go for colonoscopy.  Explained colon cancer is the leading cause of death related cancer.    Patient Active Problem List   Diagnosis Date Noted  . Neck and shoulder pain 11/08/2016  . Tobacco abuse counseling 10/25/2016  . Vitamin D deficiency 10/15/2015  . Dyslipidemia 06/02/2015  . Hypertension 04/01/2015  . Uncontrolled type 2 diabetes mellitus with peripheral neuropathy (HCC) 04/01/2015    Past Surgical History:  Procedure Laterality Date   . CYST REMOVAL TRUNK     2x  . SHOULDER SURGERY Right 2009   rotator cuff surgery    Family History  Problem Relation Age of Onset  . Cancer Mother        Breast CA  . Diabetes Mother   . Hypertension Mother   . Diabetes Brother     Social History   Socioeconomic History  . Marital status: Divorced    Spouse name: Not on file  . Number of children: 3  . Years of education: Not on file  . Highest education level: 10th grade  Occupational History  . Occupation: Retired  Engineer, production  . Financial resource strain: Not hard at all  . Food insecurity:    Worry: Never true    Inability: Never true  . Transportation needs:    Medical: No    Non-medical: No  Tobacco Use  . Smoking status: Light Tobacco Smoker    Packs/day: 0.25    Years: 50.00    Pack years: 12.50    Types: Cigarettes    Start date: 07/27/1967  . Smokeless tobacco: Never Used  . Tobacco comment: smoking 1-2 cigarettes a day  Substance and Sexual Activity  . Alcohol use: No    Alcohol/week: 0.0 oz  . Drug use: No  . Sexual activity: Yes    Partners: Female  Lifestyle  .  Physical activity:    Days per week: 7 days    Minutes per session: 60 min  . Stress: Only a little  Relationships  . Social connections:    Talks on phone: More than three times a week    Gets together: Once a week    Attends religious service: More than 4 times per year    Active member of club or organization: No    Attends meetings of clubs or organizations: Never    Relationship status: Divorced  . Intimate partner violence:    Fear of current or ex partner: No    Emotionally abused: No    Physically abused: No    Forced sexual activity: No  Other Topics Concern  . Not on file  Social History Narrative  . Not on file     Current Outpatient Medications:  .  aspirin 81 MG EC tablet, Take 81 mg by mouth daily., Disp: , Rfl: 5 .  atorvastatin (LIPITOR) 10 MG tablet, Take 1 tablet (10 mg total) by mouth daily at 6 PM.,  Disp: 90 tablet, Rfl: 1 .  Cholecalciferol (VITAMIN D3) 2000 units TABS, Take by mouth., Disp: , Rfl:  .  docusate sodium (COLACE) 100 MG capsule, Take 100 mg by mouth daily., Disp: , Rfl:  .  empagliflozin (JARDIANCE) 25 MG TABS tablet, Take 25 mg by mouth daily., Disp: 90 tablet, Rfl: 1 .  Insulin Glargine (LANTUS SOLOSTAR) 100 UNIT/ML Solostar Pen, INJECT 30 UNITS INTO THE SKIN 2 (TWO) TIMES DAILY., Disp: 15 mL, Rfl: 2 .  insulin lispro (HUMALOG KWIKPEN) 100 UNIT/ML KiwkPen, INJECT 10 UNITS SUBCUTANEOUSLY 3 TIMES ADAY (Patient taking differently: Inject 10 Units into the skin at bedtime. INJECT 10 UNITS SUBCUTANEOUSLY 3 TIMES ADAY), Disp: 15 pen, Rfl: 2 .  Insulin Pen Needle 32G X 6 MM MISC, Inject 5 each into the skin daily. Dx: Well controlled type 2 diabetes mellitus with peripheral neuropathy  E11.42, Disp: 200 each, Rfl: 2 .  losartan (COZAAR) 50 MG tablet, TAKE 1 TABLET (50 MG TOTAL) BY MOUTH DAILY., Disp: 90 tablet, Rfl: 1 .  metFORMIN (GLUCOPHAGE-XR) 750 MG 24 hr tablet, Take 1 tablet (750 mg total) by mouth 2 (two) times daily., Disp: 180 tablet, Rfl: 1 .  metoprolol tartrate (LOPRESSOR) 25 MG tablet, Take 1 tablet (25 mg total) by mouth 2 (two) times daily., Disp: 180 tablet, Rfl: 1 .  pregabalin (LYRICA) 150 MG capsule, Take 1 capsule (150 mg total) by mouth every morning., Disp: 90 capsule, Rfl: 1 .  raNITIdine HCl (RANITIDINE 75 PO), Take 1 tablet by mouth daily., Disp: , Rfl:   No Known Allergies   ROS  Constitutional: Negative for fever, positive for mild  weight change - loss.  Respiratory: Negative for cough and shortness of breath.   Cardiovascular: Negative for chest pain or palpitations.  Gastrointestinal: Negative for abdominal pain, no bowel changes.  Musculoskeletal: Negative for gait problem or joint swelling.  Skin: Negative for rash.  Neurological: Negative for dizziness or headache.  No other specific complaints in a complete review of systems (except as listed  in HPI above).  Objective  Vitals:   11/03/17 0950  BP: 120/84  Pulse: 83  Resp: 18  Temp: 98.3 F (36.8 C)  TempSrc: Oral  SpO2: 93%  Weight: 190 lb 8 oz (86.4 kg)  Height: 5\' 8"  (1.727 m)    Body mass index is 28.97 kg/m.  Physical Exam  Constitutional: Patient appears well-developed and well-nourished. No  distress.  HEENT: head atraumatic, normocephalic, pupils equal and reactive to light, neck supple, throat within normal limits Cardiovascular: Normal rate, regular rhythm and normal heart sounds.  No murmur heard. No BLE edema. Pulmonary/Chest: Effort normal and breath sounds normal. No respiratory distress. Abdominal: Soft.  There is no tenderness. Psychiatric: Patient has a normal mood and affect. behavior is normal. Judgment and thought content normal.  Recent Results (from the past 2160 hour(s))  POCT HgB A1C     Status: Abnormal   Collection Time: 11/03/17 10:06 AM  Result Value Ref Range   Hemoglobin A1C 6.9 (A) 4.0 - 5.6 %   HbA1c POC (<> result, manual entry)  4.0 - 5.6 %   HbA1c, POC (prediabetic range)  5.7 - 6.4 %   HbA1c, POC (controlled diabetic range)  0.0 - 7.0 %     PHQ2/9: Depression screen [facility] 2/9 11/03/2017 07/26/2017 06/09/2017 06/03/2017 05/03/2017  Decreased Interest 0 0 0 0 1  Down, Depressed, Hopeless 0 0 0 0 1  PHQ - 2 Score 0 0 0 0 2  Altered sleeping 0 - - - 0  Tired, decreased energy 0 - - - 1  Change in appetite 0 - - - 0  Feeling bad or failure about yourself  0 - - - 0  Trouble concentrating 0 - - - 1  Moving slowly or fidgety/restless 0 - - - 0  Suicidal thoughts 0 - - - 0  PHQ-9 Score 0 - - - 4  Difficult doing work/chores Not difficult at all - - - Not difficult at all     Fall Risk: Fall Risk  11/03/2017 07/26/2017 06/09/2017 06/03/2017 05/03/2017  Falls in the past year? No No No No No  Number falls in past yr: - - - - -  Injury with Fall? - - - - -  Follow up - - - - -     Assessment & Plan  1. Well controlled type 2  diabetes mellitus with peripheral neuropathy (HCC)  - POCT HgB A1C - metFORMIN (GLUCOPHAGE-XR) 750 MG 24 hr tablet; Take 1 tablet (750 mg total) by mouth 2 (two) times daily.  Dispense: 180 tablet; Refill: 1  - Hemoglobin A1c ( prior to next visit)   2. Essential hypertension  - COMPLETE METABOLIC PANEL WITH GFR - CBC with Differential/Platelet  3. Dyslipidemia  - Lipid panel  4. Vitamin D deficiency  Continue medication   5. Hyperlipidemia associated with type 2 diabetes mellitus (HCC)   6. Tobacco abuse counseling  Advised to stop smoking again

## 2017-12-12 ENCOUNTER — Other Ambulatory Visit: Payer: Self-pay

## 2017-12-12 DIAGNOSIS — E1142 Type 2 diabetes mellitus with diabetic polyneuropathy: Secondary | ICD-10-CM

## 2017-12-12 MED ORDER — INSULIN LISPRO 100 UNIT/ML (KWIKPEN): PEN_INJECTOR | SUBCUTANEOUS | 2 refills | Status: DC

## 2017-12-12 NOTE — Telephone Encounter (Signed)
Request for diabetes medication. Humalog  Last office visit pertaining to diabetes: 11/03/2017   Lab Results  Component Value Date   HGBA1C 6.9 (A) 11/03/2017     Follow up on 02/03/18

## 2018-02-03 ENCOUNTER — Ambulatory Visit (INDEPENDENT_AMBULATORY_CARE_PROVIDER_SITE_OTHER): Payer: Medicare Other | Admitting: Family Medicine

## 2018-02-03 ENCOUNTER — Encounter: Payer: Self-pay | Admitting: Family Medicine

## 2018-02-03 VITALS — BP 120/70 | HR 81 | Temp 98.1°F | Resp 16 | Ht 68.0 in | Wt 196.5 lb

## 2018-02-03 DIAGNOSIS — E785 Hyperlipidemia, unspecified: Secondary | ICD-10-CM

## 2018-02-03 DIAGNOSIS — Z23 Encounter for immunization: Secondary | ICD-10-CM

## 2018-02-03 DIAGNOSIS — I10 Essential (primary) hypertension: Secondary | ICD-10-CM | POA: Diagnosis not present

## 2018-02-03 DIAGNOSIS — Z716 Tobacco abuse counseling: Secondary | ICD-10-CM

## 2018-02-03 DIAGNOSIS — E1142 Type 2 diabetes mellitus with diabetic polyneuropathy: Secondary | ICD-10-CM

## 2018-02-03 DIAGNOSIS — E1169 Type 2 diabetes mellitus with other specified complication: Secondary | ICD-10-CM

## 2018-02-03 LAB — POCT GLYCOSYLATED HEMOGLOBIN (HGB A1C)
HbA1c POC (<> result, manual entry): 6.8 % (ref 4.0–5.6)
HbA1c, POC (controlled diabetic range): 6.8 % (ref 0.0–7.0)
HbA1c, POC (prediabetic range): 6.8 % — AB (ref 5.7–6.4)
Hemoglobin A1C: 6.8 % — AB (ref 4.0–5.6)

## 2018-02-03 MED ORDER — PREGABALIN 150 MG PO CAPS
150.0000 mg | ORAL_CAPSULE | Freq: Every morning | ORAL | 1 refills | Status: DC
Start: 2018-02-03 — End: 2018-04-23

## 2018-02-03 MED ORDER — LOSARTAN POTASSIUM-HCTZ 100-12.5 MG PO TABS: 1.0000 | ORAL_TABLET | Freq: Every day | ORAL | 1 refills | Status: DC

## 2018-02-03 MED ORDER — EMPAGLIFLOZIN 25 MG PO TABS: 25.0000 mg | ORAL_TABLET | Freq: Every day | ORAL | 1 refills | Status: DC

## 2018-02-03 NOTE — Progress Notes (Signed)
po

## 2018-02-03 NOTE — Progress Notes (Signed)
Name: William Parks   MRN: [id number]    DOB: Jan 17, 1946   Date:02/03/2018       Progress Note  Subjective  Chief Complaint  Chief Complaint  Patient presents with  . Diabetes  . Hypertension  . Hyperlipidemia    HPI  DMII: he is on taking Jardiance, Metformin ( not sure if he is taking it now) Lantus, and pre-meal insulin . He states he has been giving himself 5 shots per day.He is eating less bread lately. His hgbA1C is at goal for him today it is 6.8%, previously  6.9% and  7.3%, fsbs below 140 fasting but denies hypoglycemia.   He forgot to  schedule his eye exam. He denies polyphagia, polydipsia or polyuria. He could not tolerate Soliqua and is back on Lantus and pre-meal insulin . He states Lyrica works well for his neuropathy, burning on his feet is under control   HTN:  We stopped trimateren.hctz but today he brought bottle again and is taking half pill daily, he is also taking losartan 50 mg but did not bring metoprolol bottle- supposed to be taking it BID. He denies chest pain or palpitation.   Dyslipidemia: he takes medications as prescribed, he did not return for labs and we will check it today.   Tobacco use: discussed importance of quitting, he states he does not have daily cough or SOB, discussed spirometry but he wants to hold off for now. Also discussed lung cancer screen, he is not interested . Explained importance of quitting smoking again    Patient Active Problem List   Diagnosis Date Noted  . Neck and shoulder pain 11/08/2016  . Tobacco abuse counseling 10/25/2016  . Vitamin D deficiency 10/15/2015  . Dyslipidemia 06/02/2015  . Hypertension 04/01/2015  . Uncontrolled type 2 diabetes mellitus with peripheral neuropathy (HCC) 04/01/2015    Past Surgical History:  Procedure Laterality Date  . CYST REMOVAL TRUNK     2x  . SHOULDER SURGERY Right 2009   rotator cuff surgery    Family History  Problem Relation Age of Onset  . Cancer Mother        Breast  CA  . Diabetes Mother   . Hypertension Mother   . Diabetes Brother     Social History   Socioeconomic History  . Marital status: Divorced    Spouse name: Not on file  . Number of children: 3  . Years of education: Not on file  . Highest education level: 10th grade  Occupational History  . Occupation: Retired  Engineer, production  . Financial resource strain: Not hard at all  . Food insecurity:    Worry: Never true    Inability: Never true  . Transportation needs:    Medical: No    Non-medical: No  Tobacco Use  . Smoking status: Light Tobacco Smoker    Packs/day: 0.25    Years: 50.00    Pack years: 12.50    Types: Cigarettes    Start date: 07/27/1967  . Smokeless tobacco: Never Used  . Tobacco comment: smoking 1-2 cigarettes a day - advised him to quit   Substance and Sexual Activity  . Alcohol use: No    Alcohol/week: 0.0 standard drinks  . Drug use: No  . Sexual activity: Yes    Partners: Female  Lifestyle  . Physical activity:    Days per week: 7 days    Minutes per session: 60 min  . Stress: Only a little  Relationships  . Social  connections:    Talks on phone: More than three times a week    Gets together: Once a week    Attends religious service: More than 4 times per year    Active member of club or organization: No    Attends meetings of clubs or organizations: Never    Relationship status: Divorced  . Intimate partner violence:    Fear of current or ex partner: No    Emotionally abused: No    Physically abused: No    Forced sexual activity: No  Other Topics Concern  . Not on file  Social History Narrative  . Not on file     Current Outpatient Medications:  .  aspirin 81 MG EC tablet, Take 81 mg by mouth daily., Disp: , Rfl: 5 .  atorvastatin (LIPITOR) 10 MG tablet, Take 1 tablet (10 mg total) by mouth daily at 6 PM., Disp: 90 tablet, Rfl: 1 .  Cholecalciferol (VITAMIN D3) 2000 units TABS, Take by mouth., Disp: , Rfl:  .  docusate sodium (COLACE) 100  MG capsule, Take 100 mg by mouth daily., Disp: , Rfl:  .  empagliflozin (JARDIANCE) 25 MG TABS tablet, Take 25 mg by mouth daily., Disp: 90 tablet, Rfl: 1 .  Insulin Glargine (LANTUS SOLOSTAR) 100 UNIT/ML Solostar Pen, INJECT 30 UNITS INTO THE SKIN 2 (TWO) TIMES DAILY., Disp: 15 mL, Rfl: 2 .  insulin lispro (HUMALOG KWIKPEN) 100 UNIT/ML KiwkPen, 10 units before meals, Disp: 15 pen, Rfl: 2 .  Insulin Pen Needle 32G X 6 MM MISC, Inject 5 each into the skin daily. Dx: Well controlled type 2 diabetes mellitus with peripheral neuropathy  E11.42, Disp: 200 each, Rfl: 2 .  metFORMIN (GLUCOPHAGE-XR) 750 MG 24 hr tablet, Take 1 tablet (750 mg total) by mouth 2 (two) times daily., Disp: 180 tablet, Rfl: 1 .  metoprolol tartrate (LOPRESSOR) 25 MG tablet, Take 1 tablet (25 mg total) by mouth 2 (two) times daily., Disp: 180 tablet, Rfl: 1 .  pregabalin (LYRICA) 150 MG capsule, Take 1 capsule (150 mg total) by mouth every morning., Disp: 90 capsule, Rfl: 1 .  raNITIdine HCl (RANITIDINE 75 PO), Take 1 tablet by mouth daily., Disp: , Rfl:  .  losartan-hydrochlorothiazide (HYZAAR) 100-12.5 MG tablet, Take 1 tablet by mouth daily., Disp: 90 tablet, Rfl: 1  No Known Allergies  I personally reviewed active problem list, medication list, allergies, family history, social history with the patient/caregiver today.   ROS  Constitutional: Negative for fever or weight change.  Respiratory: Negative for cough and shortness of breath.   Cardiovascular: Negative for chest pain or palpitations.  Gastrointestinal: Negative for abdominal pain, no bowel changes.  Musculoskeletal: Negative for gait problem or joint swelling.  Skin: Negative for rash.  Neurological: Negative for dizziness or headache.  No other specific complaints in a complete review of systems (except as listed in HPI above).  Objective  Vitals:   02/03/18 1010  BP: 120/70  Pulse: 81  Resp: 16  Temp: 98.1 F (36.7 C)  TempSrc: Oral  SpO2: 98%   Weight: 196 lb 8 oz (89.1 kg)  Height: 5\' 8"  (1.727 m)    Body mass index is 29.88 kg/m.  Physical Exam  Constitutional: Patient appears well-developed and well-nourished. Overweight.  No distress.  HEENT: head atraumatic, normocephalic, pupils equal and reactive to light,  neck supple, throat within normal limits Cardiovascular: Normal rate, regular rhythm and normal heart sounds.  No murmur heard. No BLE edema. Pulmonary/Chest: Effort normal  and breath sounds normal. No respiratory distress. Abdominal: Soft.  There is no tenderness. Psychiatric: Patient has a normal mood and affect. behavior is normal. Judgment and thought content normal.  Recent Results (from the past 2160 hour(s))  POCT HgB A1C     Status: Abnormal   Collection Time: 02/03/18 10:21 AM  Result Value Ref Range   Hemoglobin A1C 6.8 (A) 4.0 - 5.6 %   HbA1c POC (<> result, manual entry) 6.8 4.0 - 5.6 %   HbA1c, POC (prediabetic range) 6.8 (A) 5.7 - 6.4 %   HbA1c, POC (controlled diabetic range) 6.8 0.0 - 7.0 %    Diabetic Foot Exam: Diabetic Foot Exam - Simple   Simple Foot Form Diabetic Foot exam was performed with the following findings:  Yes 02/03/2018 10:20 AM  Visual Inspection See comments:  Yes Sensation Testing Intact to touch and monofilament testing bilaterally:  Yes Pulse Check Posterior Tibialis and Dorsalis pulse intact bilaterally:  Yes Comments Corn formation 1st toe      PHQ2/9: Depression screen [facility] 2/9 02/03/2018 11/03/2017 07/26/2017 06/09/2017 06/03/2017  Decreased Interest 0 0 0 0 0  Down, Depressed, Hopeless 0 0 0 0 0  PHQ - 2 Score 0 0 0 0 0  Altered sleeping 0 0 - - -  Tired, decreased energy 0 0 - - -  Change in appetite 0 0 - - -  Feeling bad or failure about yourself  0 0 - - -  Trouble concentrating 0 0 - - -  Moving slowly or fidgety/restless 0 0 - - -  Suicidal thoughts 0 0 - - -  PHQ-9 Score 0 0 - - -  Difficult doing work/chores - Not difficult at all - - -      Fall Risk: Fall Risk  02/03/2018 11/03/2017 07/26/2017 06/09/2017 06/03/2017  Falls in the past year? No No No No No  Number falls in past yr: - - - - -  Injury with Fall? - - - - -  Follow up - - - - -      Assessment & Plan   1. Well controlled type 2 diabetes mellitus with peripheral neuropathy (HCC)  - POCT HgB A1C  - POCT HgB A1C - empagliflozin (JARDIANCE) 25 MG TABS tablet; Take 25 mg by mouth daily.  Dispense: 90 tablet; Refill: 1 - pregabalin (LYRICA) 150 MG capsule; Take 1 capsule (150 mg total) by mouth every morning.  Dispense: 90 capsule; Refill: 1 - Ambulatory referral to [facility] - he also would like to see if insurance pays for [facility], he does not like checking fsbs - he is not sure if he is taking metformin but he thinks he stopped   2. Flu vaccine need  - Flu vaccine HIGH DOSE PF  3. Tobacco abuse counseling  Discussed importance of quitting again   4. Essential hypertension  We will change from triamterene/hctz half pill daily and losartan 50 mg daily to losartan 100/12.5 mg daily   - Ambulatory referral to [facility]  5. Dyslipidemia  - Ambulatory referral to [facility]  6. Hyperlipidemia associated with type 2 diabetes mellitus (HCC)  - Ambulatory referral to [facility]

## 2018-02-08 ENCOUNTER — Other Ambulatory Visit: Payer: Self-pay | Admitting: Family Medicine

## 2018-02-10 ENCOUNTER — Ambulatory Visit: Payer: Self-pay

## 2018-02-10 ENCOUNTER — Encounter: Payer: Self-pay | Admitting: *Deleted

## 2018-02-10 ENCOUNTER — Other Ambulatory Visit: Payer: Self-pay | Admitting: Family Medicine

## 2018-02-10 ENCOUNTER — Telehealth: Payer: Self-pay

## 2018-02-10 DIAGNOSIS — I10 Essential (primary) hypertension: Secondary | ICD-10-CM

## 2018-02-10 NOTE — Progress Notes (Signed)
This encounter was created in error - please disregard.

## 2018-02-14 NOTE — Progress Notes (Signed)
This encounter was created in error - please disregard.

## 2018-02-23 ENCOUNTER — Other Ambulatory Visit: Payer: Self-pay | Admitting: Family Medicine

## 2018-02-23 DIAGNOSIS — I10 Essential (primary) hypertension: Secondary | ICD-10-CM

## 2018-03-04 ENCOUNTER — Other Ambulatory Visit: Payer: Self-pay | Admitting: Family Medicine

## 2018-03-05 ENCOUNTER — Other Ambulatory Visit: Payer: Self-pay | Admitting: Family Medicine

## 2018-03-06 ENCOUNTER — Other Ambulatory Visit: Payer: Self-pay | Admitting: Family Medicine

## 2018-03-06 MED ORDER — HYDROCHLOROTHIAZIDE 12.5 MG PO TABS: 12.5000 mg | ORAL_TABLET | Freq: Every day | ORAL | 0 refills | Status: DC

## 2018-03-06 MED ORDER — LOSARTAN POTASSIUM 100 MG PO TABS: 100.0000 mg | ORAL_TABLET | Freq: Every day | ORAL | 0 refills | Status: DC

## 2018-03-31 ENCOUNTER — Other Ambulatory Visit: Payer: Self-pay

## 2018-03-31 NOTE — Telephone Encounter (Signed)
Refill request was sent to Dr. Krichna Sowles for approval and submission.  

## 2018-04-01 NOTE — Telephone Encounter (Signed)
Medication not on patient's active list.

## 2018-04-07 ENCOUNTER — Other Ambulatory Visit: Payer: Self-pay

## 2018-04-11 ENCOUNTER — Other Ambulatory Visit: Payer: Self-pay | Admitting: Family Medicine

## 2018-04-22 ENCOUNTER — Other Ambulatory Visit: Payer: Self-pay | Admitting: Family Medicine

## 2018-04-22 DIAGNOSIS — E1142 Type 2 diabetes mellitus with diabetic polyneuropathy: Secondary | ICD-10-CM

## 2018-05-04 ENCOUNTER — Ambulatory Visit (INDEPENDENT_AMBULATORY_CARE_PROVIDER_SITE_OTHER): Payer: Medicare Other

## 2018-05-04 VITALS — BP 130/82 | HR 82 | Temp 98.0°F | Resp 16 | Ht 68.0 in | Wt 191.9 lb

## 2018-05-04 DIAGNOSIS — I10 Essential (primary) hypertension: Secondary | ICD-10-CM | POA: Diagnosis not present

## 2018-05-04 DIAGNOSIS — E1142 Type 2 diabetes mellitus with diabetic polyneuropathy: Secondary | ICD-10-CM | POA: Diagnosis not present

## 2018-05-04 DIAGNOSIS — Z0000 Encounter for general adult medical examination without abnormal findings: Secondary | ICD-10-CM

## 2018-05-04 DIAGNOSIS — E785 Hyperlipidemia, unspecified: Secondary | ICD-10-CM | POA: Diagnosis not present

## 2018-05-04 NOTE — Patient Instructions (Signed)
Mr. William Parks , Thank you for taking time to come for your Medicare Wellness Visit. I appreciate your ongoing commitment to your health goals. Please review the following plan we discussed and let me know if I can assist you in the future.   Screening recommendations/referrals: Colonoscopy: Cologuard ordered, please complete and send to lab.  Recommended yearly ophthalmology/optometry visit for glaucoma screening and checkup. Please call [facility] to schedule an appointment at [phone]. Recommended yearly dental visit for hygiene and checkup  Vaccinations: Influenza vaccine: done 02/03/18 Pneumococcal vaccine: done 04/27/17 Tdap vaccine: due - please contact us if you get a cut or scrape Shingles vaccine: Shingrix discussed. Please contact your pharmacy for coverage information.     Advanced directives: Advance directive discussed with you today. Even though you declined this today please call our office should you change your mind and we can give you the proper paperwork for you to fill out.  Conditions/risks identified: Recommend eating 3 healthy meals per day.  If you wish to quit smoking, help is available. For free tobacco cessation program offerings call the [facility] at [phone] or Live Well Line at [phone](336) [phone]. You may also visit [email or web address] or email [email or web address] for more information on other programs.    Next appointment: 06/06/18 10:00 Dr. Carlynn PurlSowles  Preventive Care 365 Years and Older, Male Preventive care refers to lifestyle choices and visits with your health care provider that can promote health and wellness. What does preventive care include?  A yearly physical exam. This is also called an annual well check.  Dental exams once or twice a year.  Routine eye exams. Ask your health care provider how often you should have your eyes checked.  Personal lifestyle choices, including:  Daily care of your teeth and  gums.  Regular physical activity.  Eating a healthy diet.  Avoiding tobacco and drug use.  Limiting alcohol use.  Practicing safe sex.  Taking low doses of aspirin every day.  Taking vitamin and mineral supplements as recommended by your health care provider. What happens during an annual well check? The services and screenings done by your health care provider during your annual well check will depend on your age, overall health, lifestyle risk factors, and family history of disease. Counseling  Your health care provider may ask you questions about your:  Alcohol use.  Tobacco use.  Drug use.  Emotional well-being.  Home and relationship well-being.  Sexual activity.  Eating habits.  History of falls.  Memory and ability to understand (cognition).  Work and work Astronomerenvironment. Screening  You may have the following tests or measurements:  Height, weight, and BMI.  Blood pressure.  Lipid and cholesterol levels. These may be checked every 5 years, or more frequently if you are over 74 years old.  Skin check.  Lung cancer screening. You may have this screening every year starting at age 74 if you have a 30-pack-year history of smoking and currently smoke or have quit within the past 15 years.  Fecal occult blood test (FOBT) of the stool. You may have this test every year starting at age 74.  Flexible sigmoidoscopy or colonoscopy. You may have a sigmoidoscopy every 5 years or a colonoscopy every 10 years starting at age 74.  Prostate cancer screening. Recommendations will vary depending on your family history and other risks.  Hepatitis C blood test.  Hepatitis B blood test.  Sexually transmitted disease (STD) testing.  Diabetes screening. This is done by  checking your blood sugar (glucose) after you have not eaten for a while (fasting). You may have this done every 1-3 years.  Abdominal aortic aneurysm (AAA) screening. You may need this if you are a  current or former smoker.  Osteoporosis. You may be screened starting at age 31 if you are at high risk. Talk with your health care provider about your test results, treatment options, and if necessary, the need for more tests. Vaccines  Your health care provider may recommend certain vaccines, such as:  Influenza vaccine. This is recommended every year.  Tetanus, diphtheria, and acellular pertussis (Tdap, Td) vaccine. You may need a Td booster every 10 years.  Zoster vaccine. You may need this after age 82.  Pneumococcal 13-valent conjugate (PCV13) vaccine. One dose is recommended after age 95.  Pneumococcal polysaccharide (PPSV23) vaccine. One dose is recommended after age 14. Talk to your health care provider about which screenings and vaccines you need and how often you need them. This information is not intended to replace advice given to you by your health care provider. Make sure you discuss any questions you have with your health care provider. Document Released: 05/09/2015 Document Revised: 12/31/2015 Document Reviewed: 02/11/2015 Elsevier Interactive Patient Education  2017 [employer].  Fall Prevention in the Home Falls can cause injuries. They can happen to people of all ages. There are many things you can do to make your home safe and to help prevent falls. What can I do on the outside of my home?  Regularly fix the edges of walkways and driveways and fix any cracks.  Remove anything that might make you trip as you walk through a door, such as a raised step or threshold.  Trim any bushes or trees on the path to your home.  Use bright outdoor lighting.  Clear any walking paths of anything that might make someone trip, such as rocks or tools.  Regularly check to see if handrails are loose or broken. Make sure that both sides of any steps have handrails.  Any raised decks and porches should have guardrails on the edges.  Have any leaves, snow, or ice cleared  regularly.  Use sand or salt on walking paths during winter.  Clean up any spills in your garage right away. This includes oil or grease spills. What can I do in the bathroom?  Use night lights.  Install grab bars by the toilet and in the tub and shower. Do not use towel bars as grab bars.  Use non-skid mats or decals in the tub or shower.  If you need to sit down in the shower, use a plastic, non-slip stool.  Keep the floor dry. Clean up any water that spills on the floor as soon as it happens.  Remove soap buildup in the tub or shower regularly.  Attach bath mats securely with double-sided non-slip rug tape.  Do not have throw rugs and other things on the floor that can make you trip. What can I do in the bedroom?  Use night lights.  Make sure that you have a light by your bed that is easy to reach.  Do not use any sheets or blankets that are too big for your bed. They should not hang down onto the floor.  Have a firm chair that has side arms. You can use this for support while you get dressed.  Do not have throw rugs and other things on the floor that can make you trip. What can I  do in the kitchen?  Clean up any spills right away.  Avoid walking on wet floors.  Keep items that you use a lot in easy-to-reach places.  If you need to reach something above you, use a strong step stool that has a grab bar.  Keep electrical cords out of the way.  Do not use floor polish or wax that makes floors slippery. If you must use wax, use non-skid floor wax.  Do not have throw rugs and other things on the floor that can make you trip. What can I do with my stairs?  Do not leave any items on the stairs.  Make sure that there are handrails on both sides of the stairs and use them. Fix handrails that are broken or loose. Make sure that handrails are as long as the stairways.  Check any carpeting to make sure that it is firmly attached to the stairs. Fix any carpet that is loose  or worn.  Avoid having throw rugs at the top or bottom of the stairs. If you do have throw rugs, attach them to the floor with carpet tape.  Make sure that you have a light switch at the top of the stairs and the bottom of the stairs. If you do not have them, ask someone to add them for you. What else can I do to help prevent falls?  Wear shoes that:  Do not have high heels.  Have rubber bottoms.  Are comfortable and fit you well.  Are closed at the toe. Do not wear sandals.  If you use a stepladder:  Make sure that it is fully opened. Do not climb a closed stepladder.  Make sure that both sides of the stepladder are locked into place.  Ask someone to hold it for you, if possible.  Clearly mark and make sure that you can see:  Any grab bars or handrails.  First and last steps.  Where the edge of each step is.  Use tools that help you move around (mobility aids) if they are needed. These include:  Canes.  Walkers.  Scooters.  Crutches.  Turn on the lights when you go into a dark area. Replace any light bulbs as soon as they burn out.  Set up your furniture so you have a clear path. Avoid moving your furniture around.  If any of your floors are uneven, fix them.  If there are any pets around you, be aware of where they are.  Review your medicines with your doctor. Some medicines can make you feel dizzy. This can increase your chance of falling. Ask your doctor what other things that you can do to help prevent falls. This information is not intended to replace advice given to you by your health care provider. Make sure you discuss any questions you have with your health care provider. Document Released: 02/06/2009 Document Revised: 09/18/2015 Document Reviewed: 05/17/2014 Elsevier Interactive Patient Education  2017 [employer].
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
## 2018-05-05 LAB — LIPID PANEL
Cholesterol: 137 mg/dL (ref <200)
HDL: 56 mg/dL (ref >40)
LDL Cholesterol (Calc): 66 mg/dL (calc)
Non-HDL Cholesterol (Calc): 81 mg/dL (calc) (ref <130)
Total CHOL/HDL Ratio: 2.4 (calc) (ref <5.0)
Triglycerides: 71 mg/dL (ref <150)

## 2018-05-05 LAB — CBC WITH DIFFERENTIAL/PLATELET
Absolute Monocytes: 391 cells/uL (ref 200–950)
Basophils Absolute: 39 cells/uL (ref 0–200)
Basophils Relative: 0.9 %
Eosinophils Absolute: 69 cells/uL (ref 15–500)
Eosinophils Relative: 1.6 %
HCT: 45.8 % (ref 38.5–50.0)
Hemoglobin: 15.4 g/dL (ref 13.2–17.1)
Lymphs Abs: 1178 cells/uL (ref 850–3900)
MCH: 28.5 pg (ref 27.0–33.0)
MCHC: 33.6 g/dL (ref 32.0–36.0)
MCV: 84.7 fL (ref 80.0–100.0)
MPV: 10.5 fL (ref 7.5–12.5)
Monocytes Relative: 9.1 %
Neutro Abs: 2623 cells/uL (ref 1500–7800)
Neutrophils Relative %: 61 %
Platelets: 221 10*3/uL (ref 140–400)
RBC: 5.41 10*6/uL (ref 4.20–5.80)
RDW: 13.3 % (ref 11.0–15.0)
Total Lymphocyte: 27.4 %
WBC: 4.3 10*3/uL (ref 3.8–10.8)

## 2018-05-05 LAB — COMPLETE METABOLIC PANEL WITH GFR
AG Ratio: 1.3 (calc) (ref 1.0–2.5)
ALT: 16 U/L (ref 9–46)
AST: 14 U/L (ref 10–35)
Albumin: 4 g/dL (ref 3.6–5.1)
Alkaline phosphatase (APISO): 67 U/L (ref 40–115)
BUN: 23 mg/dL (ref 7–25)
CO2: 29 mmol/L (ref 20–32)
Calcium: 9.2 mg/dL (ref 8.6–10.3)
Chloride: 102 mmol/L (ref 98–110)
Creat: 1.17 mg/dL (ref 0.70–1.18)
GFR, Est African American: 71 mL/min/{1.73_m2} (ref >=60)
GFR, Est Non African American: 61 mL/min/{1.73_m2} (ref >=60)
Globulin: 3.2 g/dL (calc) (ref 1.9–3.7)
Glucose, Bld: 135 mg/dL — ABNORMAL HIGH (ref 65–99)
Potassium: 4 mmol/L (ref 3.5–5.3)
Sodium: 139 mmol/L (ref 135–146)
Total Bilirubin: 0.9 mg/dL (ref 0.2–1.2)
Total Protein: 7.2 g/dL (ref 6.1–8.1)

## 2018-05-05 LAB — HEMOGLOBIN A1C
Hgb A1c MFr Bld: 8.3 % of total Hgb — ABNORMAL HIGH (ref <5.7)
Mean Plasma Glucose: 192 (calc)
eAG (mmol/L): 10.6 (calc)

## 2018-05-08 ENCOUNTER — Telehealth: Payer: Self-pay | Admitting: Family Medicine

## 2018-05-08 NOTE — Telephone Encounter (Signed)
Called pharmacy. Rx is still available. They will get it ready for patient to pickup.

## 2018-05-08 NOTE — Telephone Encounter (Signed)
Dr Carlynn Purl had sent Lyrica to the pt pharmacy however he did not pick it up. He is now asking that you please resend it due to him being out of this medication.

## 2018-05-27 ENCOUNTER — Other Ambulatory Visit: Payer: Self-pay | Admitting: Family Medicine

## 2018-05-27 DIAGNOSIS — E1142 Type 2 diabetes mellitus with diabetic polyneuropathy: Secondary | ICD-10-CM

## 2018-06-06 ENCOUNTER — Encounter: Payer: Self-pay | Admitting: Family Medicine

## 2018-06-06 ENCOUNTER — Ambulatory Visit (INDEPENDENT_AMBULATORY_CARE_PROVIDER_SITE_OTHER): Payer: Medicare Other | Admitting: Family Medicine

## 2018-06-06 VITALS — BP 128/78 | HR 72 | Temp 97.5°F | Resp 16 | Ht 68.0 in | Wt 195.2 lb

## 2018-06-06 DIAGNOSIS — E559 Vitamin D deficiency, unspecified: Secondary | ICD-10-CM | POA: Diagnosis not present

## 2018-06-06 DIAGNOSIS — I10 Essential (primary) hypertension: Secondary | ICD-10-CM | POA: Diagnosis not present

## 2018-06-06 DIAGNOSIS — E1169 Type 2 diabetes mellitus with other specified complication: Secondary | ICD-10-CM

## 2018-06-06 DIAGNOSIS — E785 Hyperlipidemia, unspecified: Secondary | ICD-10-CM | POA: Diagnosis not present

## 2018-06-06 DIAGNOSIS — E1142 Type 2 diabetes mellitus with diabetic polyneuropathy: Secondary | ICD-10-CM

## 2018-06-06 DIAGNOSIS — E1140 Type 2 diabetes mellitus with diabetic neuropathy, unspecified: Secondary | ICD-10-CM | POA: Diagnosis not present

## 2018-06-06 MED ORDER — LOSARTAN POTASSIUM 100 MG PO TABS: 100.0000 mg | ORAL_TABLET | Freq: Every day | ORAL | 1 refills | Status: DC

## 2018-06-06 MED ORDER — METOPROLOL TARTRATE 25 MG PO TABS: 25.0000 mg | ORAL_TABLET | Freq: Two times a day (BID) | ORAL | 1 refills | Status: DC

## 2018-06-06 MED ORDER — HYDROCHLOROTHIAZIDE 12.5 MG PO TABS: 12.5000 mg | ORAL_TABLET | Freq: Every day | ORAL | 0 refills | Status: DC

## 2018-06-06 MED ORDER — PREGABALIN 150 MG PO CAPS: 150.0000 mg | ORAL_CAPSULE | Freq: Every morning | ORAL | 1 refills | Status: DC

## 2018-06-06 MED ORDER — ATORVASTATIN CALCIUM 10 MG PO TABS: 10.0000 mg | ORAL_TABLET | Freq: Every day | ORAL | 1 refills | Status: DC

## 2018-06-06 MED ORDER — METFORMIN HCL ER 750 MG PO TB24: 750.0000 mg | ORAL_TABLET | Freq: Two times a day (BID) | ORAL | 1 refills | Status: DC

## 2018-06-06 NOTE — Progress Notes (Signed)
Name: William Parks   MRN: [id number]    DOB: Apr 19, 1946   Date:06/06/2018       Progress Note  Subjective  Chief Complaint  Chief Complaint  Patient presents with  . Medication Refill    4 month F/U  . Diabetes    Checks his BS once a day, Lowest- 59 Average-100's Highest-183  . Hypertension    Denies any symptoms-got a new BP cuff recently  . Hyperlipidemia  . Constipation    Takes Colace daily-eats bacon and has burning after going to the bathroom.    HPI  DMII: he is on taking Jardiance 25 mg Metformin 1500 mg daily,  Lantus 30 units BID,, and pre-meal insulin 10 units once or twice daily . He states he has been giving himself 5 shots per day.Heis eating less bread lately. His hgbA1C has gone up to 8.3% January 2020, he states not as compliant with his diet through the holidays and has not been as active. FSBS at home was high over the holidays, fasting now low 100's occasionally 150. He states very seldom goes down below 80 , but he did not his monitor with him today.  He denies polyphagia, polydipsia or polyuria. He could not tolerate Soliqua and is back on Lantus and pre-meal insulin. He states Lyrica works well for his neuropathy, burning on his feet is under control  He states he afraid of cost of eye exam, explained he can use his health insurance  HTN:he is taking losartan 100 and metoprolol 25 BID and is doing well, bp is at goal, no chest pain or palpitation.   Dyslipidemia: hetakes medications as prescribed, Atorvastatin daily and denies side effects such as myalgia.   Tobacco use: discussed importance of quitting, he states he does not have daily cough or SOB, discussed spirometry but he wants to hold off for now. Also discussed lung cancer screen, he is not interested . Explained importance of quitting smoking again   Constipation: taking Colace and doing well, no longer having problems with hemorrhoids   Patient Active Problem List   Diagnosis Date Noted  .  Neck and shoulder pain 11/08/2016  . Tobacco abuse counseling 10/25/2016  . Vitamin D deficiency 10/15/2015  . Dyslipidemia 06/02/2015  . Hypertension 04/01/2015  . Uncontrolled type 2 diabetes mellitus with peripheral neuropathy (HCC) 04/01/2015    Past Surgical History:  Procedure Laterality Date  . CYST REMOVAL TRUNK     2x  . SHOULDER SURGERY Right 2009   rotator cuff surgery    Family History  Problem Relation Age of Onset  . Cancer Mother        Breast CA  . Diabetes Mother   . Hypertension Mother   . Diabetes Brother     Social History   Socioeconomic History  . Marital status: Divorced    Spouse name: Not on file  . Number of children: 3  . Years of education: Not on file  . Highest education level: 10th grade  Occupational History  . Occupation: Retired  Engineer, production  . Financial resource strain: Not hard at all  . Food insecurity:    Worry: Never true    Inability: Never true  . Transportation needs:    Medical: No    Non-medical: No  Tobacco Use  . Smoking status: Light Tobacco Smoker    Packs/day: 0.25    Years: 50.00    Pack years: 12.50    Types: Cigarettes    Start date: 07/27/1967  .  Smokeless tobacco: Never Used  . Tobacco comment: smoking 1-2 cigarettes a day - advised him to quit   Substance and Sexual Activity  . Alcohol use: No    Alcohol/week: 0.0 standard drinks  . Drug use: No  . Sexual activity: Yes    Partners: Female  Lifestyle  . Physical activity:    Days per week: 7 days    Minutes per session: 30 min  . Stress: Not at all  Relationships  . Social connections:    Talks on phone: More than three times a week    Gets together: Once a week    Attends religious service: More than 4 times per year    Active member of club or organization: No    Attends meetings of clubs or organizations: Never    Relationship status: Divorced  . Intimate partner violence:    Fear of current or ex partner: No    Emotionally abused: No     Physically abused: No    Forced sexual activity: No  Other Topics Concern  . Not on file  Social History Narrative   Lives with girlfriend - Garry Heater for the past 18 years.    He states we can discuss medical problems with her   [phone]     Current Outpatient Medications:  .  aspirin 81 MG EC tablet, Take 81 mg by mouth daily., Disp: , Rfl: 5 .  atorvastatin (LIPITOR) 10 MG tablet, Take 1 tablet (10 mg total) by mouth daily at 6 PM., Disp: 90 tablet, Rfl: 1 .  Cholecalciferol (VITAMIN D3) 2000 units TABS, Take by mouth., Disp: , Rfl:  .  docusate sodium (COLACE) 100 MG capsule, Take 100 mg by mouth daily., Disp: , Rfl:  .  empagliflozin (JARDIANCE) 25 MG TABS tablet, Take 25 mg by mouth daily., Disp: 90 tablet, Rfl: 1 .  hydrochlorothiazide (HYDRODIURIL) 12.5 MG tablet, Take 1 tablet (12.5 mg total) by mouth daily., Disp: 90 tablet, Rfl: 0 .  Insulin Glargine (LANTUS) 100 UNIT/ML Solostar Pen, INJECT 30 UNITS INTO THE SKIN 2 (TWO) TIMES DAILY., Disp: 15 mL, Rfl: 1 .  insulin lispro (HUMALOG KWIKPEN) 100 UNIT/ML KiwkPen, 10 units before meals, Disp: 15 pen, Rfl: 2 .  Insulin Pen Needle 32G X 6 MM MISC, Inject 5 each into the skin daily. Dx: Well controlled type 2 diabetes mellitus with peripheral neuropathy  E11.42, Disp: 200 each, Rfl: 2 .  losartan (COZAAR) 100 MG tablet, Take 1 tablet (100 mg total) by mouth daily., Disp: 90 tablet, Rfl: 0 .  metFORMIN (GLUCOPHAGE-XR) 750 MG 24 hr tablet, TAKE 1 TABLET (750 MG TOTAL) BY MOUTH 2 (TWO) TIMES DAILY., Disp: 60 tablet, Rfl: 0 .  metoprolol tartrate (LOPRESSOR) 25 MG tablet, TAKE 1 TABLET BY MOUTH TWICE A DAY, Disp: 180 tablet, Rfl: 1 .  pregabalin (LYRICA) 150 MG capsule, TAKE 1 CAPSULE (150 MG TOTAL) BY MOUTH EVERY MORNING., Disp: 90 capsule, Rfl: 0 .  raNITIdine HCl (RANITIDINE 75 PO), Take 1 tablet by mouth daily., Disp: , Rfl:   No Known Allergies  I personally reviewed active problem list, medication list, allergies, family  history, social history with the patient/caregiver today.   ROS  Constitutional: Negative for fever or weight change.  Respiratory: Negative for cough and shortness of breath.   Cardiovascular: Negative for chest pain or palpitations.  Gastrointestinal: Negative for abdominal pain, no bowel changes.  Musculoskeletal: Negative for gait problem or joint swelling.  Skin: Negative for rash.  Neurological: Negative for dizziness or headache.  No other specific complaints in a complete review of systems (except as listed in HPI above).  Objective  Vitals:   06/06/18 1015  BP: 128/78  Pulse: 72  Resp: 16  Temp: (!) 97.5 F (36.4 C)  TempSrc: Oral  SpO2: 97%  Weight: 195 lb 3.2 oz (88.5 kg)  Height: 5\' 8"  (1.727 m)    Body mass index is 29.68 kg/m.  Physical Exam  Constitutional: Patient appears well-developed and well-nourished. Overweight.  No distress.  HEENT: head atraumatic, normocephalic, pupils equal and reactive to light,  neck supple, throat within normal limits Cardiovascular: Normal rate, regular rhythm and normal heart sounds.  No murmur heard. No BLE edema. Pulmonary/Chest: Effort normal and breath sounds normal. No respiratory distress. Abdominal: Soft.  There is no tenderness. Psychiatric: Patient has a normal mood and affect. behavior is normal. Judgment and thought content normal.  Recent Results (from the past 2160 hour(s))  Hemoglobin A1c     Status: Abnormal   Collection Time: 05/04/18  9:46 AM  Result Value Ref Range   Hgb A1c MFr Bld 8.3 (H) <5.7 % of total Hgb    Comment: For someone without known diabetes, a hemoglobin A1c value of 6.5% or greater indicates that they may have  diabetes and this should be confirmed with a follow-up  test. . For someone with known diabetes, a value <7% indicates  that their diabetes is well controlled and a value  greater than or equal to 7% indicates suboptimal  control. A1c targets should be individualized based  on  duration of diabetes, age, comorbid conditions, and  other considerations. . Currently, no consensus exists regarding use of hemoglobin A1c for diagnosis of diabetes for children. .    Mean Plasma Glucose 192 (calc)   eAG (mmol/L) 10.6 (calc)  COMPLETE METABOLIC PANEL WITH GFR     Status: Abnormal   Collection Time: 05/04/18  9:46 AM  Result Value Ref Range   Glucose, Bld 135 (H) 65 - 99 mg/dL    Comment: .            Fasting reference interval . For someone without known diabetes, a glucose value >125 mg/dL indicates that they may have diabetes and this should be confirmed with a follow-up test. .    BUN 23 7 - 25 mg/dL   Creat 6.54 6.50 - 3.54 mg/dL    Comment: For patients >51 years of age, the reference limit for Creatinine is approximately 13% higher for people identified as African-American. .    GFR, Est Non African American 61 > OR = 60 mL/min/1.57m2   GFR, Est African American 71 > OR = 60 mL/min/1.44m2   BUN/Creatinine Ratio NOT APPLICABLE 6 - 22 (calc)   Sodium 139 135 - 146 mmol/L   Potassium 4.0 3.5 - 5.3 mmol/L   Chloride 102 98 - 110 mmol/L   CO2 29 20 - 32 mmol/L   Calcium 9.2 8.6 - 10.3 mg/dL   Total Protein 7.2 6.1 - 8.1 g/dL   Albumin 4.0 3.6 - 5.1 g/dL   Globulin 3.2 1.9 - 3.7 g/dL (calc)   AG Ratio 1.3 1.0 - 2.5 (calc)   Total Bilirubin 0.9 0.2 - 1.2 mg/dL   Alkaline phosphatase (APISO) 67 40 - 115 U/L   AST 14 10 - 35 U/L   ALT 16 9 - 46 U/L  CBC with Differential/Platelet     Status: None   Collection Time: 05/04/18  9:46 AM  Result Value Ref Range   WBC 4.3 3.8 - 10.8 Thousand/uL   RBC 5.41 4.20 - 5.80 Million/uL   Hemoglobin 15.4 13.2 - 17.1 g/dL   HCT 16.145.8 09.638.5 - 04.550.0 %   MCV 84.7 80.0 - 100.0 fL   MCH 28.5 27.0 - 33.0 pg   MCHC 33.6 32.0 - 36.0 g/dL   RDW 40.913.3 81.111.0 - 91.415.0 %   Platelets 221 140 - 400 Thousand/uL   MPV 10.5 7.5 - 12.5 fL   Neutro Abs 2,623 1,500 - 7,800 cells/uL   Lymphs Abs 1,178 850 - 3,900 cells/uL    Absolute Monocytes 391 200 - 950 cells/uL   Eosinophils Absolute 69 15 - 500 cells/uL   Basophils Absolute 39 0 - 200 cells/uL   Neutrophils Relative % 61 %   Total Lymphocyte 27.4 %   Monocytes Relative 9.1 %   Eosinophils Relative 1.6 %   Basophils Relative 0.9 %  Lipid panel     Status: None   Collection Time: 05/04/18  9:46 AM  Result Value Ref Range   Cholesterol 137 <200 mg/dL   HDL 56 >78>40 mg/dL   Triglycerides 71 <295<150 mg/dL   LDL Cholesterol (Calc) 66 mg/dL (calc)    Comment: Reference range: <100 . Desirable range <100 mg/dL for primary prevention;   <70 mg/dL for patients with CHD or diabetic patients  with > or = 2 CHD risk factors. Marland Kitchen. LDL-C is now calculated using the Martin-Hopkins  calculation, which is a validated novel method providing  better accuracy than the Friedewald equation in the  estimation of LDL-C.  Horald PollenMartin SS et al. Lenox AhrJAMA. 6213;086(572013;310(19): 2061-2068  (http://education.[facility].com/faq/FAQ164)    Total CHOL/HDL Ratio 2.4 <5.0 (calc)   Non-HDL Cholesterol (Calc) 81 <846<130 mg/dL (calc)    Comment: For patients with diabetes plus 1 major ASCVD risk  factor, treating to a non-HDL-C goal of <100 mg/dL  (LDL-C of <96<70 mg/dL) is considered a therapeutic  option.      PHQ2/9: Depression screen [facility] 2/9 06/06/2018 05/04/2018 02/03/2018 11/03/2017 07/26/2017  Decreased Interest 0 0 0 0 0  Down, Depressed, Hopeless 0 0 0 0 0  PHQ - 2 Score 0 0 0 0 0  Altered sleeping - - 0 0 -  Tired, decreased energy - - 0 0 -  Change in appetite - - 0 0 -  Feeling bad or failure about yourself  - - 0 0 -  Trouble concentrating - - 0 0 -  Moving slowly or fidgety/restless - - 0 0 -  Suicidal thoughts - - 0 0 -  PHQ-9 Score - - 0 0 -  Difficult doing work/chores - - - Not difficult at all -     Fall Risk: Fall Risk  05/04/2018 02/03/2018 11/03/2017 07/26/2017 06/09/2017  Falls in the past year? 0 No No No No  Number falls in past yr: 0 - - - -  Injury with Fall? - - - - -   Follow up - - - - -    Assessment & Plan  1. Hyperlipidemia associated with type 2 diabetes mellitus (HCC)  He did not bring atorvastatin with him, explained importance of bringing all medication with him every visit  2. Dyslipidemia  - atorvastatin (LIPITOR) 10 MG tablet; Take 1 tablet (10 mg total) by mouth daily at 6 PM.  Dispense: 90 tablet; Refill: 1  3. Vitamin D deficiency   4. Essential hypertension  - metoprolol tartrate (LOPRESSOR) 25 MG tablet;  Take 1 tablet (25 mg total) by mouth 2 (two) times daily.  Dispense: 180 tablet; Refill: 1  5. Controlled type 2 diabetes with neuropathy (HCC)   6. Well controlled type 2 diabetes mellitus with peripheral neuropathy (HCC)  - pregabalin (LYRICA) 150 MG capsule; Take 1 capsule (150 mg total) by mouth every morning.  Dispense: 90 capsule; Refill: 1 - metFORMIN (GLUCOPHAGE-XR) 750 MG 24 hr tablet; Take 1 tablet (750 mg total) by mouth 2 (two) times daily.  Dispense: 180 tablet; Refill: 1

## 2018-07-18 ENCOUNTER — Other Ambulatory Visit: Payer: Self-pay | Admitting: Family Medicine

## 2018-07-18 NOTE — Telephone Encounter (Signed)
Refill request for general medication. Lantus To [facility]  Last office visit 06/06/2018  Follow up on 09/05/2018

## 2018-09-05 ENCOUNTER — Other Ambulatory Visit: Payer: Self-pay

## 2018-09-05 ENCOUNTER — Ambulatory Visit (INDEPENDENT_AMBULATORY_CARE_PROVIDER_SITE_OTHER): Payer: Medicare Other | Admitting: Family Medicine

## 2018-09-05 ENCOUNTER — Encounter: Payer: Self-pay | Admitting: Family Medicine

## 2018-09-05 VITALS — BP 130/70 | HR 81 | Temp 97.7°F | Resp 16 | Ht 68.0 in | Wt 192.1 lb

## 2018-09-05 DIAGNOSIS — E1169 Type 2 diabetes mellitus with other specified complication: Secondary | ICD-10-CM | POA: Diagnosis not present

## 2018-09-05 DIAGNOSIS — E1165 Type 2 diabetes mellitus with hyperglycemia: Secondary | ICD-10-CM

## 2018-09-05 DIAGNOSIS — E785 Hyperlipidemia, unspecified: Secondary | ICD-10-CM | POA: Diagnosis not present

## 2018-09-05 DIAGNOSIS — I10 Essential (primary) hypertension: Secondary | ICD-10-CM

## 2018-09-05 DIAGNOSIS — E1142 Type 2 diabetes mellitus with diabetic polyneuropathy: Secondary | ICD-10-CM | POA: Diagnosis not present

## 2018-09-05 DIAGNOSIS — IMO0002 Reserved for concepts with insufficient information to code with codable children: Secondary | ICD-10-CM

## 2018-09-05 MED ORDER — INSULIN LISPRO PROT & LISPRO (75-25 MIX) 100 UNIT/ML KWIKPEN: 15.0000 [IU] | PEN_INJECTOR | Freq: Two times a day (BID) | SUBCUTANEOUS | 0 refills | Status: DC

## 2018-09-05 MED ORDER — HYDROCHLOROTHIAZIDE 12.5 MG PO TABS: 12.5000 mg | ORAL_TABLET | Freq: Every day | ORAL | 0 refills | Status: DC

## 2018-09-05 NOTE — Progress Notes (Signed)
Name: William Parks   MRN: [id number]    DOB: 04-24-1946   Date:09/05/2018       Progress Note  Subjective  Chief Complaint  Chief Complaint  Patient presents with  . Diabetes  . Hypertension  . Hyperlipidemia    HPI  DMII: he is on taking Jardiance 25 mg Metformin 1500 mg daily, Lantus 30 units BID,, and pre-meal insulin 10 units once or twice daily, he states that he has difficulty taking so many shots daily, and would like to cut down on medication . [place] he has been more stressed lately, he states his significant other and daughter have been sick and he is the primary caregiver. His hgbA1C has gone up to 8.3% January 2020 FSBS at home has been 88-160, mostly in the low 100's He denies polyphagia, polydipsia or polyuria. He could not tolerate Soliqua and is back on Lantus and pre-meal insulin, however he wants to give himself less shots.  He states Lyrica works well for his neuropathy, burning on his feet is under control. We will try Mix insulin 70/25 at lower dose and return in one month to make sure glucose is at goal   HTN:he is taking losartan 100 and metoprolol 25 BID and is doing well, hctz 12.5 mg , bp is at goal, no chest pain or palpitation.  Dyslipidemia: hestates he is taking a pill at 6 pm but did not bring to the visit today  Tobacco use: discussed importance of quitting, he states he does not have daily cough or SOB, discussed spirometry but he wants to hold off for now. Also discussed lung cancer screen, he is not interested. Explained importance of quitting smoking again Unchanged     Patient Active Problem List   Diagnosis Date Noted  . Neck and shoulder pain 11/08/2016  . Tobacco abuse counseling 10/25/2016  . Vitamin D deficiency 10/15/2015  . Dyslipidemia 06/02/2015  . Hypertension 04/01/2015  . Uncontrolled type 2 diabetes mellitus with peripheral neuropathy (HCC) 04/01/2015    Past Surgical History:  Procedure Laterality Date  . CYST  REMOVAL TRUNK     2x  . SHOULDER SURGERY Right 2009   rotator cuff surgery    Family History  Problem Relation Age of Onset  . Cancer Mother        Breast CA  . Diabetes Mother   . Hypertension Mother   . Diabetes Brother     Social History   Socioeconomic History  . Marital status: Divorced    Spouse name: Not on file  . Number of children: 3  . Years of education: Not on file  . Highest education level: 10th grade  Occupational History  . Occupation: Retired  Engineer, production  . Financial resource strain: Not hard at all  . Food insecurity:    Worry: Never true    Inability: Never true  . Transportation needs:    Medical: No    Non-medical: No  Tobacco Use  . Smoking status: Light Tobacco Smoker    Packs/day: 0.25    Years: 50.00    Pack years: 12.50    Types: Cigarettes    Start date: 07/27/1967  . Smokeless tobacco: Never Used  Substance and Sexual Activity  . Alcohol use: No    Alcohol/week: 0.0 standard drinks  . Drug use: No  . Sexual activity: Yes    Partners: Female  Lifestyle  . Physical activity:    Days per week: 7 days    Minutes per  session: 30 min  . Stress: Not at all  Relationships  . Social connections:    Talks on phone: More than three times a week    Gets together: Once a week    Attends religious service: More than 4 times per year    Active member of club or organization: No    Attends meetings of clubs or organizations: Never    Relationship status: Divorced  . Intimate partner violence:    Fear of current or ex partner: No    Emotionally abused: No    Physically abused: No    Forced sexual activity: No  Other Topics Concern  . Not on file  Social History Narrative   Lives with girlfriend - Garry Heaterunice Boone for the past 18 years.    He states we can discuss medical problems with her   [phone]     Current Outpatient Medications:  .  aspirin 81 MG EC tablet, Take 81 mg by mouth daily., Disp: , Rfl: 5 .  atorvastatin  (LIPITOR) 10 MG tablet, Take 1 tablet (10 mg total) by mouth daily at 6 PM., Disp: 90 tablet, Rfl: 1 .  Cholecalciferol (VITAMIN D3) 2000 units TABS, Take by mouth., Disp: , Rfl:  .  docusate sodium (COLACE) 100 MG capsule, Take 100 mg by mouth daily., Disp: , Rfl:  .  empagliflozin (JARDIANCE) 25 MG TABS tablet, Take 25 mg by mouth daily., Disp: 90 tablet, Rfl: 1 .  hydrochlorothiazide (HYDRODIURIL) 12.5 MG tablet, Take 1 tablet (12.5 mg total) by mouth daily., Disp: 90 tablet, Rfl: 0 .  Insulin Pen Needle 32G X 6 MM MISC, Inject 5 each into the skin daily. Dx: Well controlled type 2 diabetes mellitus with peripheral neuropathy  E11.42, Disp: 200 each, Rfl: 2 .  losartan (COZAAR) 100 MG tablet, Take 1 tablet (100 mg total) by mouth daily., Disp: 90 tablet, Rfl: 1 .  metFORMIN (GLUCOPHAGE-XR) 750 MG 24 hr tablet, Take 1 tablet (750 mg total) by mouth 2 (two) times daily., Disp: 180 tablet, Rfl: 1 .  metoprolol tartrate (LOPRESSOR) 25 MG tablet, Take 1 tablet (25 mg total) by mouth 2 (two) times daily., Disp: 180 tablet, Rfl: 1 .  pregabalin (LYRICA) 150 MG capsule, Take 1 capsule (150 mg total) by mouth every morning., Disp: 90 capsule, Rfl: 1 .  tizanidine (ZANAFLEX) 2 MG capsule, Take 2 mg by mouth 3 (three) times daily., Disp: , Rfl:  .  Insulin Lispro Prot & Lispro (HUMALOG MIX 75/25 KWIKPEN) (75-25) 100 UNIT/ML Kwikpen, Inject 15-25 Units into the skin 2 (two) times daily with a meal. 25 in am and 15 before dinner, Disp: 15 mL, Rfl: 0  No Known Allergies  I personally reviewed active problem list, medication list, allergies, family history, social history with the patient/caregiver today.   ROS  Ten systems reviewed and is negative except as mentioned in HPI   Objective   Vitals:   09/05/18 1040  BP: 130/70  Pulse: 81  Resp: 16  Temp: 97.7 F (36.5 C)  TempSrc: Oral  SpO2: 98%  Weight: 192 lb 1.6 oz (87.1 kg)  Height: 5\' 8"  (1.727 m)    Body mass index is 29.21  kg/m.  Physical Exam  Constitutional: Patient appears well-developed and well-nourished. No distress.  HEENT: head atraumatic, normocephalic, pupils equal and reactive to light, neck supply  Cardiovascular: Normal rate, regular rhythm and normal heart sounds.  No murmur heard. No BLE edema. Pulmonary/Chest: Effort normal and breath sounds normal.  No respiratory distress. Abdominal: Soft.  There is no tenderness. Psychiatric: Patient has a normal mood and affect. behavior is normal. Judgment and thought content normal.  PHQ2/9: Depression screen [facility] 2/9 09/05/2018 06/06/2018 05/04/2018 02/03/2018 11/03/2017  Decreased Interest 0 0 0 0 0  Down, Depressed, Hopeless 0 0 0 0 0  PHQ - 2 Score 0 0 0 0 0  Altered sleeping 0 - - 0 0  Tired, decreased energy 0 - - 0 0  Change in appetite 0 - - 0 0  Feeling bad or failure about yourself  0 - - 0 0  Trouble concentrating 0 - - 0 0  Moving slowly or fidgety/restless 0 - - 0 0  Suicidal thoughts 0 - - 0 0  PHQ-9 Score 0 - - 0 0  Difficult doing work/chores - - - - Not difficult at all  Some recent data might be hidden    phq 9 is negative   Fall Risk: Fall Risk  09/05/2018 05/04/2018 02/03/2018 11/03/2017 07/26/2017  Falls in the past year? 0 0 No No No  Number falls in past yr: 0 0 - - -  Injury with Fall? 0 - - - -  Follow up - - - - -    Functional Status Survey: Is the patient deaf or have difficulty hearing?: No Does the patient have difficulty seeing, even when wearing glasses/contacts?: No Does the patient have difficulty concentrating, remembering, or making decisions?: No Does the patient have difficulty walking or climbing stairs?: No Does the patient have difficulty dressing or bathing?: No Does the patient have difficulty doing errands alone such as visiting a doctor's office or shopping?: No    Assessment & Plan   1. Hyperlipidemia associated with type 2 diabetes mellitus (HCC)  - HgB A1c - Urine Microalbumin w/creat.  ratio - Insulin Lispro Prot & Lispro (HUMALOG MIX 75/25 KWIKPEN) (75-25) 100 UNIT/ML Kwikpen; Inject 15-25 Units into the skin 2 (two) times daily with a meal. 25 in am and 15 before dinner  Dispense: 15 mL; Refill: 0  2. Dyslipidemia  He is not sure if he is taking atorvastatin   3. Essential hypertension  - hydrochlorothiazide (HYDRODIURIL) 12.5 MG tablet; Take 1 tablet (12.5 mg total) by mouth daily.  Dispense: 90 tablet; Refill: 0  4. Uncontrolled type 2 diabetes mellitus with peripheral neuropathy (HCC)  - HgB A1c - Urine Microalbumin w/creat. ratio - Insulin Lispro Prot & Lispro (HUMALOG MIX 75/25 KWIKPEN) (75-25) 100 UNIT/ML Kwikpen; Inject 15-25 Units into the skin 2 (two) times daily with a meal. 25 in am and 15 before dinner  Dispense: 15 mL; Refill: 0  Advised chronic care management but he refused it

## 2018-09-06 LAB — HEMOGLOBIN A1C
Hgb A1c MFr Bld: 7 % of total Hgb — ABNORMAL HIGH (ref <5.7)
Mean Plasma Glucose: 154 (calc)
eAG (mmol/L): 8.5 (calc)

## 2018-09-06 LAB — MICROALBUMIN / CREATININE URINE RATIO
Creatinine, Urine: 123 mg/dL (ref 20–320)
Microalb Creat Ratio: 8 mcg/mg creat (ref <30)
Microalb, Ur: 1 mg/dL

## 2018-10-18 ENCOUNTER — Ambulatory Visit (INDEPENDENT_AMBULATORY_CARE_PROVIDER_SITE_OTHER): Payer: Medicare Other | Admitting: Family Medicine

## 2018-10-18 ENCOUNTER — Other Ambulatory Visit: Payer: Self-pay

## 2018-10-18 ENCOUNTER — Encounter: Payer: Self-pay | Admitting: Family Medicine

## 2018-10-18 VITALS — BP 130/66 | HR 83 | Temp 97.9°F | Resp 16 | Ht 68.0 in | Wt 192.6 lb

## 2018-10-18 DIAGNOSIS — E1169 Type 2 diabetes mellitus with other specified complication: Secondary | ICD-10-CM | POA: Diagnosis not present

## 2018-10-18 DIAGNOSIS — E785 Hyperlipidemia, unspecified: Secondary | ICD-10-CM

## 2018-10-18 MED ORDER — BLOOD GLUCOSE METER KIT: PACK | 0 refills | Status: DC

## 2018-10-18 NOTE — Progress Notes (Signed)
Name: William Parks   MRN: [id number]    DOB: 04-19-1946   Date:10/18/2018       Progress Note  Subjective  Chief Complaint  Chief Complaint  Patient presents with  . Follow-up    6 week F/U  . Diabetes    Change in medication last visit.    HPI  DMII: he is on taking Jardiance25 mgMetformin 1500 mg daily,off Lantus and pre-meal since last visit and is on Humalog 75/25 since May 2020, he states no hypoglycemic episodes, he brought in his glucose meter but unable to turn it on, states it broke last week. Prior to that he states his glucose was below 150 but not sure of the value. He denies polyphagia, polydipsia or polyuria.    Patient Active Problem List   Diagnosis Date Noted  . Neck and shoulder pain 11/08/2016  . Tobacco abuse counseling 10/25/2016  . Vitamin D deficiency 10/15/2015  . Dyslipidemia 06/02/2015  . Hypertension 04/01/2015  . Uncontrolled type 2 diabetes mellitus with peripheral neuropathy ([place]) 04/01/2015    Past Surgical History:  Procedure Laterality Date  . CYST REMOVAL TRUNK     2x  . SHOULDER SURGERY Right 2009   rotator cuff surgery    Family History  Problem Relation Age of Onset  . Cancer Mother        Breast CA  . Diabetes Mother   . Hypertension Mother   . Diabetes Brother     Social History   Socioeconomic History  . Marital status: Divorced    Spouse name: Not on file  . Number of children: 3  . Years of education: Not on file  . Highest education level: 10th grade  Occupational History  . Occupation: Retired  Scientific laboratory technician  . Financial resource strain: Not hard at all  . Food insecurity    Worry: Never true    Inability: Never true  . Transportation needs    Medical: No    Non-medical: No  Tobacco Use  . Smoking status: Light Tobacco Smoker    Packs/day: 0.25    Years: 50.00    Pack years: 12.50    Types: Cigarettes    Start date: 07/27/1967  . Smokeless tobacco: Never Used  Substance and Sexual Activity  .  Alcohol use: No    Alcohol/week: 0.0 standard drinks  . Drug use: No  . Sexual activity: Yes    Partners: Female  Lifestyle  . Physical activity    Days per week: 7 days    Minutes per session: 30 min  . Stress: Not at all  Relationships  . Social connections    Talks on phone: More than three times a week    Gets together: Once a week    Attends religious service: More than 4 times per year    Active member of club or organization: No    Attends meetings of clubs or organizations: Never    Relationship status: Divorced  . Intimate partner violence    Fear of current or ex partner: No    Emotionally abused: No    Physically abused: No    Forced sexual activity: No  Other Topics Concern  . Not on file  Social History Narrative   Lives with girlfriend - [name] for the past 18 years.    He states we can discuss medical problems with her   [phone]     Current Outpatient Medications:  .  aspirin 81 MG EC tablet, Take  81 mg by mouth daily., Disp: , Rfl: 5 .  atorvastatin (LIPITOR) 10 MG tablet, Take 1 tablet (10 mg total) by mouth daily at 6 PM., Disp: 90 tablet, Rfl: 1 .  Cholecalciferol (VITAMIN D3) 2000 units TABS, Take by mouth., Disp: , Rfl:  .  docusate sodium (COLACE) 100 MG capsule, Take 100 mg by mouth daily., Disp: , Rfl:  .  empagliflozin (JARDIANCE) 25 MG TABS tablet, Take 25 mg by mouth daily., Disp: 90 tablet, Rfl: 1 .  hydrochlorothiazide (HYDRODIURIL) 12.5 MG tablet, Take 1 tablet (12.5 mg total) by mouth daily., Disp: 90 tablet, Rfl: 0 .  Insulin Lispro Prot & Lispro (HUMALOG MIX 75/25 KWIKPEN) (75-25) 100 UNIT/ML Kwikpen, Inject 15-25 Units into the skin 2 (two) times daily with a meal. 25 in am and 15 before dinner, Disp: 15 mL, Rfl: 0 .  Insulin Pen Needle 32G X 6 MM MISC, Inject 5 each into the skin daily. Dx: Well controlled type 2 diabetes mellitus with peripheral neuropathy  E11.42, Disp: 200 each, Rfl: 2 .  losartan (COZAAR) 100 MG tablet, Take 1  tablet (100 mg total) by mouth daily., Disp: 90 tablet, Rfl: 1 .  metFORMIN (GLUCOPHAGE-XR) 750 MG 24 hr tablet, Take 1 tablet (750 mg total) by mouth 2 (two) times daily., Disp: 180 tablet, Rfl: 1 .  metoprolol tartrate (LOPRESSOR) 25 MG tablet, Take 1 tablet (25 mg total) by mouth 2 (two) times daily., Disp: 180 tablet, Rfl: 1 .  pregabalin (LYRICA) 150 MG capsule, Take 1 capsule (150 mg total) by mouth every morning., Disp: 90 capsule, Rfl: 1 .  tizanidine (ZANAFLEX) 2 MG capsule, Take 2 mg by mouth 3 (three) times daily., Disp: , Rfl:   No Known Allergies  I personally reviewed active problem list, medication list, allergies, family history with the patient/caregiver today.   ROS  Ten systems reviewed and is negative except as mentioned in HPI   Objective  Vitals:   10/18/18 1454  BP: 130/66  Pulse: 83  Resp: 16  Temp: 97.9 F (36.6 C)  TempSrc: Oral  SpO2: 96%  Weight: 192 lb 9.6 oz (87.4 kg)  Height: 5' 8"  (1.727 m)    Body mass index is 29.28 kg/m.  Physical Exam  Constitutional: Patient appears well-developed and well-nourished. Obese  No distress.  HEENT: head atraumatic, normocephalic, pupils equal and reactive to light,  neck supple,ora mucosa not done  Cardiovascular: Normal rate, regular rhythm and normal heart sounds.  No murmur heard. No BLE edema. Pulmonary/Chest: Effort normal and breath sounds normal. No respiratory distress. Abdominal: Soft.  There is no tenderness. Psychiatric: Patient has a normal mood and affect. behavior is normal. Judgment and thought content normal.  Recent Results (from the past 2160 hour(s))  HgB A1c     Status: Abnormal   Collection Time: 09/05/18 10:44 AM  Result Value Ref Range   Hgb A1c MFr Bld 7.0 (H) <5.7 % of total Hgb    Comment: For someone without known diabetes, a hemoglobin A1c value of 6.5% or greater indicates that they may have  diabetes and this should be confirmed with a follow-up  test. . For someone  with known diabetes, a value <7% indicates  that their diabetes is well controlled and a value  greater than or equal to 7% indicates suboptimal  control. A1c targets should be individualized based on  duration of diabetes, age, comorbid conditions, and  other considerations. . Currently, no consensus exists regarding use of hemoglobin A1c  for diagnosis of diabetes for children. .    Mean Plasma Glucose 154 (calc)   eAG (mmol/L) 8.5 (calc)  Urine Microalbumin w/creat. ratio     Status: None   Collection Time: 09/05/18 10:44 AM  Result Value Ref Range   Creatinine, Urine 123 20 - 320 mg/dL   Microalb, Ur 1.0 mg/dL    Comment: Reference Range Not established    Microalb Creat Ratio 8 <30 mcg/mg creat    Comment: . The ADA defines abnormalities in albumin excretion as follows: Marland Kitchen Category         Result (mcg/mg creatinine) . Normal                    <30 Microalbuminuria         30-299  Clinical albuminuria   > OR = 300 . The ADA recommends that at least two of three specimens collected within a 3-6 month period be abnormal before considering a patient to be within a diagnostic category.      PHQ2/9: Depression screen [facility] 2/9 10/18/2018 09/05/2018 06/06/2018 05/04/2018 02/03/2018  Decreased Interest 0 0 0 0 0  Down, Depressed, Hopeless 0 0 0 0 0  PHQ - 2 Score 0 0 0 0 0  Altered sleeping 0 0 - - 0  Tired, decreased energy 0 0 - - 0  Change in appetite 0 0 - - 0  Feeling bad or failure about yourself  0 0 - - 0  Trouble concentrating 0 0 - - 0  Moving slowly or fidgety/restless 0 0 - - 0  Suicidal thoughts 0 0 - - 0  PHQ-9 Score 0 0 - - 0  Difficult doing work/chores - - - - -  Some recent data might be hidden    phq 9 is negative  Fall Risk: Fall Risk  10/18/2018 09/05/2018 05/04/2018 02/03/2018 11/03/2017  Falls in the past year? 0 0 0 No No  Number falls in past yr: 0 0 0 - -  Injury with Fall? 0 0 - - -  Follow up - - - - -    Functional Status Survey: Is the  patient deaf or have difficulty hearing?: No Does the patient have difficulty seeing, even when wearing glasses/contacts?: No Does the patient have difficulty concentrating, remembering, or making decisions?: No Does the patient have difficulty walking or climbing stairs?: No Does the patient have difficulty dressing or bathing?: No Does the patient have difficulty doing errands alone such as visiting a doctor's office or shopping?: No   Assessment & Plan  1. Hyperlipidemia associated with type 2 diabetes mellitus ([place])  - blood glucose meter kit and supplies; Dispense based on patient and insurance preference. Use up to four times daily as directed. (FOR ICD-10 E10.9, E11.9).  Dispense: 1 each; Refill: 0

## 2018-10-19 ENCOUNTER — Telehealth: Payer: Self-pay | Admitting: Family Medicine

## 2018-10-19 NOTE — Telephone Encounter (Signed)
Pt stated that there has been a recall on his metformin medication (he received a letter in the mail). Would like something different to [facility]

## 2018-10-19 NOTE — Telephone Encounter (Signed)
Spoke with [facility]. William Parks medication was not affected. I informed patient as well.

## 2018-11-05 ENCOUNTER — Other Ambulatory Visit: Payer: Self-pay | Admitting: Family Medicine

## 2018-11-05 DIAGNOSIS — E1165 Type 2 diabetes mellitus with hyperglycemia: Secondary | ICD-10-CM

## 2018-11-05 DIAGNOSIS — E1169 Type 2 diabetes mellitus with other specified complication: Secondary | ICD-10-CM

## 2018-11-05 DIAGNOSIS — E1142 Type 2 diabetes mellitus with diabetic polyneuropathy: Secondary | ICD-10-CM

## 2018-11-05 DIAGNOSIS — IMO0002 Reserved for concepts with insufficient information to code with codable children: Secondary | ICD-10-CM

## 2018-11-23 ENCOUNTER — Other Ambulatory Visit: Payer: Self-pay | Admitting: Family Medicine

## 2018-11-25 ENCOUNTER — Other Ambulatory Visit: Payer: Self-pay | Admitting: Family Medicine

## 2018-11-27 ENCOUNTER — Other Ambulatory Visit: Payer: Self-pay | Admitting: Family Medicine

## 2018-11-27 DIAGNOSIS — I10 Essential (primary) hypertension: Secondary | ICD-10-CM

## 2018-11-30 ENCOUNTER — Other Ambulatory Visit: Payer: Self-pay | Admitting: Family Medicine

## 2018-11-30 DIAGNOSIS — IMO0002 Reserved for concepts with insufficient information to code with codable children: Secondary | ICD-10-CM

## 2018-11-30 DIAGNOSIS — E1165 Type 2 diabetes mellitus with hyperglycemia: Secondary | ICD-10-CM

## 2018-11-30 DIAGNOSIS — E1169 Type 2 diabetes mellitus with other specified complication: Secondary | ICD-10-CM

## 2018-11-30 DIAGNOSIS — E785 Hyperlipidemia, unspecified: Secondary | ICD-10-CM

## 2018-11-30 DIAGNOSIS — E1142 Type 2 diabetes mellitus with diabetic polyneuropathy: Secondary | ICD-10-CM

## 2018-11-30 NOTE — Telephone Encounter (Signed)
Request for diabetes medication.  Humalog   Last office visit pertaining to diabetes: 10/18/2018   Lab Results  Component Value Date   HGBA1C 7.0 (H) 09/05/2018      Follow up on 12/04/2018

## 2018-12-04 ENCOUNTER — Ambulatory Visit (INDEPENDENT_AMBULATORY_CARE_PROVIDER_SITE_OTHER): Payer: Medicare Other | Admitting: Family Medicine

## 2018-12-04 ENCOUNTER — Other Ambulatory Visit: Payer: Self-pay

## 2018-12-04 ENCOUNTER — Encounter: Payer: Self-pay | Admitting: Family Medicine

## 2018-12-04 VITALS — BP 114/80 | HR 80 | Temp 97.5°F | Resp 16 | Ht 68.0 in | Wt 189.3 lb

## 2018-12-04 DIAGNOSIS — Z1211 Encounter for screening for malignant neoplasm of colon: Secondary | ICD-10-CM

## 2018-12-04 DIAGNOSIS — Z716 Tobacco abuse counseling: Secondary | ICD-10-CM

## 2018-12-04 DIAGNOSIS — E785 Hyperlipidemia, unspecified: Secondary | ICD-10-CM | POA: Diagnosis not present

## 2018-12-04 DIAGNOSIS — E1140 Type 2 diabetes mellitus with diabetic neuropathy, unspecified: Secondary | ICD-10-CM

## 2018-12-04 DIAGNOSIS — E1169 Type 2 diabetes mellitus with other specified complication: Secondary | ICD-10-CM | POA: Diagnosis not present

## 2018-12-04 DIAGNOSIS — E559 Vitamin D deficiency, unspecified: Secondary | ICD-10-CM

## 2018-12-04 DIAGNOSIS — I10 Essential (primary) hypertension: Secondary | ICD-10-CM

## 2018-12-04 LAB — POCT GLYCOSYLATED HEMOGLOBIN (HGB A1C): Hemoglobin A1C: 7.5 % — AB (ref 4.0–5.6)

## 2018-12-04 MED ORDER — ATORVASTATIN CALCIUM 10 MG PO TABS: 10.0000 mg | ORAL_TABLET | Freq: Every day | ORAL | 1 refills | Status: DC

## 2018-12-04 MED ORDER — INSULIN LISPRO PROT & LISPRO (75-25 MIX) 100 UNIT/ML KWIKPEN: PEN_INJECTOR | SUBCUTANEOUS | 2 refills | Status: DC

## 2018-12-04 NOTE — Progress Notes (Signed)
Name: William Parks   MRN: [id number]    DOB: 07-14-1945   Date:12/04/2018       Progress Note  Subjective  Chief Complaint  Chief Complaint  Patient presents with  . Diabetes  . Hyperlipidemia    HPI  DMII: he is on taking Jardiance25 mgMetformin 1500 mg daily,off Lantus and is now on Humalog Mix 75/25 25 unis in am and 15 units at night His hgbA1Cis at goal today at 7.5%.  He denies polyphagia, polydipsia or polyuria. He could not tolerate [place]  He states Lyrica works well for his neuropathy, burning on his feet is under control. He is on statin and also ARB for kidney protection. FSBS checked at least once a day and around 140's.   HTN:he is taking losartan 100 and metoprolol 25 BID and is doing well, hctz 12.5 mg , bp is low today, but he states at home fluctuates, but does not recall at low bp, recently 160 SBP. Explained that he needs to write the values down, afraid to change medication without knowing home readings, denies chest pain, palpitation no dizziness.  Dyslipidemia: heis taking Atorvastatin and denies myalgia   Tobacco use: discussed importance of quitting, he states he does not have daily cough or SOB, discussed spirometry but he wants to hold off for now. Also discussed lung cancer screen, he is not interested. Explained importance of quitting smoking again No changes    Patient Active Problem List   Diagnosis Date Noted  . Neck and shoulder pain 11/08/2016  . Tobacco abuse counseling 10/25/2016  . Vitamin D deficiency 10/15/2015  . Dyslipidemia 06/02/2015  . Hypertension 04/01/2015  . Uncontrolled type 2 diabetes mellitus with peripheral neuropathy ([place]) 04/01/2015    Past Surgical History:  Procedure Laterality Date  . CYST REMOVAL TRUNK     2x  . SHOULDER SURGERY Right 2009   rotator cuff surgery    Family History  Problem Relation Age of Onset  . Cancer Mother        Breast CA  . Diabetes Mother   . Hypertension Mother   . Diabetes  Brother     Social History   Socioeconomic History  . Marital status: Divorced    Spouse name: Not on file  . Number of children: 3  . Years of education: Not on file  . Highest education level: 10th grade  Occupational History  . Occupation: Retired  Scientific laboratory technician  . Financial resource strain: Not hard at all  . Food insecurity    Worry: Never true    Inability: Never true  . Transportation needs    Medical: No    Non-medical: No  Tobacco Use  . Smoking status: Light Tobacco Smoker    Packs/day: 0.25    Years: 50.00    Pack years: 12.50    Types: Cigarettes    Start date: 07/27/1967  . Smokeless tobacco: Never Used  Substance and Sexual Activity  . Alcohol use: No    Alcohol/week: 0.0 standard drinks  . Drug use: No  . Sexual activity: Yes    Partners: Female  Lifestyle  . Physical activity    Days per week: 7 days    Minutes per session: 30 min  . Stress: Not at all  Relationships  . Social connections    Talks on phone: More than three times a week    Gets together: Once a week    Attends religious service: More than 4 times per year  Active member of club or organization: No    Attends meetings of clubs or organizations: Never    Relationship status: Divorced  . Intimate partner violence    Fear of current or ex partner: No    Emotionally abused: No    Physically abused: No    Forced sexual activity: No  Other Topics Concern  . Not on file  Social History Narrative   Lives with girlfriend - [name] for the past 18 years.    He states we can discuss medical problems with her   [phone]     Current Outpatient Medications:  .  aspirin 81 MG EC tablet, Take 81 mg by mouth daily., Disp: , Rfl: 5 .  atorvastatin (LIPITOR) 10 MG tablet, Take 1 tablet (10 mg total) by mouth daily at 6 PM., Disp: 90 tablet, Rfl: 1 .  blood glucose meter kit and supplies, Dispense based on patient and insurance preference. Use up to four times daily as directed. (FOR  ICD-10 E10.9, E11.9)., Disp: 1 each, Rfl: 0 .  Cholecalciferol (VITAMIN D3) 2000 units TABS, Take by mouth., Disp: , Rfl:  .  docusate sodium (COLACE) 100 MG capsule, Take 100 mg by mouth daily., Disp: , Rfl:  .  empagliflozin (JARDIANCE) 25 MG TABS tablet, Take 25 mg by mouth daily., Disp: 90 tablet, Rfl: 1 .  HUMALOG MIX 75/25 KWIKPEN (75-25) 100 UNIT/ML Kwikpen, INJECT INTO THE SKIN 2 (TWO) TIMES DAILY WITH A MEAL. 25 UNITS IN AM AND 15 UNITS BEFORE DINNER, Disp: 15 mL, Rfl: 0 .  hydrochlorothiazide (HYDRODIURIL) 12.5 MG tablet, TAKE 1 TABLET BY MOUTH EVERY DAY, Disp: 30 tablet, Rfl: 0 .  Insulin Pen Needle 32G X 6 MM MISC, Inject 5 each into the skin daily. Dx: Well controlled type 2 diabetes mellitus with peripheral neuropathy  E11.42, Disp: 200 each, Rfl: 2 .  losartan (COZAAR) 100 MG tablet, TAKE 1 TABLET BY MOUTH EVERY DAY, Disp: 90 tablet, Rfl: 0 .  metFORMIN (GLUCOPHAGE-XR) 750 MG 24 hr tablet, Take 1 tablet (750 mg total) by mouth 2 (two) times daily., Disp: 180 tablet, Rfl: 1 .  metoprolol tartrate (LOPRESSOR) 25 MG tablet, Take 1 tablet (25 mg total) by mouth 2 (two) times daily., Disp: 180 tablet, Rfl: 1 .  ONETOUCH ULTRA test strip, TEST UP TO 4 TIMES A DAY, Disp: 100 strip, Rfl: 0 .  pregabalin (LYRICA) 150 MG capsule, Take 1 capsule (150 mg total) by mouth every morning., Disp: 90 capsule, Rfl: 1 .  tizanidine (ZANAFLEX) 2 MG capsule, Take 2 mg by mouth 3 (three) times daily., Disp: , Rfl:  .  Lancets (ONETOUCH DELICA PLUS [id number]) MISC, TEST UP TO 4 TIMES A DAY, Disp: , Rfl:   No Known Allergies  I personally reviewed active problem list, medication list, allergies, family history, social history with the patient/caregiver today.   ROS  Constitutional: Negative for fever or weight change.  Respiratory: Negative for cough and shortness of breath.   Cardiovascular: Negative for chest pain or palpitations.  Gastrointestinal: Negative for abdominal pain, no bowel changes.   Musculoskeletal: Negative for gait problem or joint swelling.  Skin: Negative for rash.  Neurological: Negative for dizziness or headache.  No other specific complaints in a complete review of systems (except as listed in HPI above).  Objective  Vitals:   12/04/18 1501  BP: 106/60  Pulse: 80  Resp: 16  Temp: (!) 97.5 F (36.4 C)  TempSrc: Temporal  SpO2: 98%  Weight:  189 lb 4.8 oz (85.9 kg)  Height: 5' 8"  (1.727 m)    Body mass index is 28.78 kg/m.  Physical Exam  Constitutional: Patient appears well-developed and well-nourished. Overweight. No distress.  HEENT: head atraumatic, normocephalic, pupils equal and reactive to light,neck supple Cardiovascular: Normal rate, regular rhythm and normal heart sounds.  No murmur heard. No BLE edema. Pulmonary/Chest: Effort normal and breath sounds normal. No respiratory distress. Abdominal: Soft.  There is no tenderness. Psychiatric: Patient has a normal mood and affect. behavior is normal. Judgment and thought content normal.  Recent Results (from the past 2160 hour(s))  POCT HgB A1C     Status: Abnormal   Collection Time: 12/04/18  3:07 PM  Result Value Ref Range   Hemoglobin A1C 7.5 (A) 4.0 - 5.6 %   HbA1c POC (<> result, manual entry)     HbA1c, POC (prediabetic range)     HbA1c, POC (controlled diabetic range)       PHQ2/9: Depression screen [facility] 2/9 12/04/2018 10/18/2018 09/05/2018 06/06/2018 05/04/2018  Decreased Interest 0 0 0 0 0  Down, Depressed, Hopeless 0 0 0 0 0  PHQ - 2 Score 0 0 0 0 0  Altered sleeping 0 0 0 - -  Tired, decreased energy 0 0 0 - -  Change in appetite 0 0 0 - -  Feeling bad or failure about yourself  0 0 0 - -  Trouble concentrating 0 0 0 - -  Moving slowly or fidgety/restless 0 0 0 - -  Suicidal thoughts 0 0 0 - -  PHQ-9 Score 0 0 0 - -  Difficult doing work/chores - - - - -  Some recent data might be hidden    phq 9 is negative   Fall Risk: Fall Risk  12/04/2018 10/18/2018 09/05/2018  05/04/2018 02/03/2018  Falls in the past year? 0 0 0 0 No  Number falls in past yr: 0 0 0 0 -  Injury with Fall? 0 0 0 - -  Follow up - - - - -    Functional Status Survey: Is the patient deaf or have difficulty hearing?: No Does the patient have difficulty seeing, even when wearing glasses/contacts?: No Does the patient have difficulty concentrating, remembering, or making decisions?: No Does the patient have difficulty walking or climbing stairs?: No Does the patient have difficulty dressing or bathing?: No Does the patient have difficulty doing errands alone such as visiting a doctor's office or shopping?: No   Assessment & Plan  1. Controlled type 2 diabetes with neuropathy ([facility])  - POCT HgB A1C - Insulin Lispro Prot & Lispro (HUMALOG MIX 75/25 KWIKPEN) (75-25) 100 UNIT/ML Kwikpen; INJECT INTO THE SKIN 2 (TWO) TIMES DAILY WITH A MEAL. 25 UNITS IN AM AND 15 UNITS BEFORE DINNER  Dispense: 15 mL; Refill: 2  2. Colon cancer screening  - Cologuard  3. Dyslipidemia  - atorvastatin (LIPITOR) 10 MG tablet; Take 1 tablet (10 mg total) by mouth daily at 6 PM.  Dispense: 90 tablet; Refill: 1  4. Hyperlipidemia associated with type 2 diabetes mellitus ([facility])  - Insulin Lispro Prot & Lispro (HUMALOG MIX 75/25 KWIKPEN) (75-25) 100 UNIT/ML Kwikpen; INJECT INTO THE SKIN 2 (TWO) TIMES DAILY WITH A MEAL. 25 UNITS IN AM AND 15 UNITS BEFORE DINNER  Dispense: 15 mL; Refill: 2  5. Tobacco abuse counseling  Advised him to quit again   6. Vitamin D deficiency   7. Essential hypertension  BP is towards low end of  normal but usually at goal, no symptoms

## 2018-12-04 NOTE — Patient Instructions (Signed)
COVID-19 Frequently Asked Questions °COVID-19 (coronavirus disease) is an infection that is caused by a large family of viruses. Some viruses cause illness in people and others cause illness in animals like camels, cats, and bats. In some cases, the viruses that cause illness in animals can spread to humans. °Where did the coronavirus come from? °In December 2019, [place] told the World Health Organization (WHO) of several cases of lung disease (human respiratory illness). These cases were linked to an open seafood and livestock market in the city of [place]. The link to the seafood and livestock market suggests that the virus may have spread from animals to humans. However, since that first outbreak in December, the virus has also been shown to spread from person to person. °What is the name of the disease and the virus? °Disease name °Early on, this disease was called novel coronavirus. This is because scientists determined that the disease was caused by a new (novel) respiratory virus. The World Health Organization (WHO) has now named the disease COVID-19, or coronavirus disease. °Virus name °The virus that causes the disease is called severe acute respiratory syndrome coronavirus 2 (SARS-CoV-2). °More information on disease and virus naming °World Health Organization (WHO): www.who.int/emergencies/diseases/novel-coronavirus-2019/technical-guidance/naming-the-coronavirus-disease-(covid-2019)-and-the-virus-that-causes-it °Who is at risk for complications from coronavirus disease? °Some people may be at higher risk for complications from coronavirus disease. This includes older adults and people who have chronic diseases, such as heart disease, diabetes, and lung disease. °If you are at higher risk for complications, take these extra precautions: °· Avoid close contact with people who are sick or have a fever or cough. Stay at least 3-6 ft (1-2 m) away from them, if possible. °· Wash your hands often with soap and  water for at least 20 seconds. °· Avoid touching your face, mouth, nose, or eyes. °· Keep supplies on hand at home, such as food, medicine, and cleaning supplies. °· Stay home as much as possible. °· Avoid social gatherings and travel. °How does coronavirus disease spread? °The virus that causes coronavirus disease spreads easily from person to person (is contagious). There are also cases of community-spread disease. This means the disease has spread to: °· People who have no known contact with other infected people. °· People who have not traveled to areas where there are known cases. °It appears to spread from one person to another through droplets from coughing or sneezing. °Can I get the virus from touching surfaces or objects? °There is still a lot that we do not know about the virus that causes coronavirus disease. Scientists are basing a lot of information on what they know about similar viruses, such as: °· Viruses cannot generally survive on surfaces for long. They need a human body (host) to survive. °· It is more likely that the virus is spread by close contact with people who are sick (direct contact), such as through: °? Shaking hands or hugging. °? Breathing in respiratory droplets that travel through the air. This can happen when an infected person coughs or sneezes on or near other people. °· It is less likely that the virus is spread when a person touches a surface or object that has the virus on it (indirect contact). The virus may be able to enter the body if the person touches a surface or object and then touches his or her face, eyes, nose, or mouth. °Can a person spread the virus without having symptoms of the disease? °It may be possible for the virus to spread before a person   has symptoms of the disease, but this is most likely not the main way the virus is spreading. It is more likely for the virus to spread by being in close contact with people who are sick and breathing in the respiratory  droplets of a sick person's cough or sneeze. °What are the symptoms of coronavirus disease? °Symptoms vary from person to person and can range from mild to severe. Symptoms may include: °· Fever. °· Cough. °· Tiredness, weakness, or fatigue. °· Fast breathing or feeling short of breath. °These symptoms can appear anywhere from 2 to 14 days after you have been exposed to the virus. If you develop symptoms, call your health care provider. People with severe symptoms may need hospital care. °If I am exposed to the virus, how long does it take before symptoms start? °Symptoms of coronavirus disease may appear anywhere from 2 to 14 days after a person has been exposed to the virus. If you develop symptoms, call your health care provider. °Should I be tested for this virus? °Your health care provider will decide whether to test you based on your symptoms, history of exposure, and your risk factors. °How does a health care provider test for this virus? °Health care providers will collect samples to send for testing. Samples may include: °· Taking a swab of fluid from the nose. °· Taking fluid from the lungs by having you cough up mucus (sputum) into a sterile cup. °· Taking a blood sample. °· Taking a stool or urine sample. °Is there a treatment or vaccine for this virus? °Currently, there is no vaccine to prevent coronavirus disease. Also, there are no medicines like antibiotics or antivirals to treat the virus. A person who becomes sick is given supportive care, which means rest and fluids. A person may also relieve his or her symptoms by using over-the-counter medicines that treat sneezing, coughing, and runny nose. These are the same medicines that a person takes for the common cold. °If you develop symptoms, call your health care provider. People with severe symptoms may need hospital care. °What can I do to protect myself and my family from this virus? ° °  ° °You can protect yourself and your family by taking the  same actions that you would take to prevent the spread of other viruses. Take the following actions: °· Wash your hands often with soap and water for at least 20 seconds. If soap and water are not available, use alcohol-based hand sanitizer. °· Avoid touching your face, mouth, nose, or eyes. °· Cough or sneeze into a tissue, sleeve, or elbow. Do not cough or sneeze into your hand or the air. °? If you cough or sneeze into a tissue, throw it away immediately and wash your hands. °· Disinfect objects and surfaces that you frequently touch every day. °· Avoid close contact with people who are sick or have a fever or cough. Stay at least 3-6 ft (1-2 m) away from them, if possible. °· Stay home if you are sick, except to get medical care. Call your health care provider before you get medical care. °· Make sure your vaccines are up to date. Ask your health care provider what vaccines you need. °What should I do if I need to travel? °Follow travel recommendations from your local health authority, the CDC, and WHO. °Travel information and advice °· Centers for Disease Control and Prevention (CDC): www.cdc.gov/coronavirus/2019-ncov/travelers/index.html °· World Health Organization (WHO): www.who.int/emergencies/diseases/novel-coronavirus-2019/travel-advice °Know the risks and take action to protect your health °·   You are at higher risk of getting coronavirus disease if you are traveling to areas with an outbreak or if you are exposed to travelers from areas with an outbreak. °· Wash your hands often and practice good hygiene to lower the risk of catching or spreading the virus. °What should I do if I am sick? °General instructions to stop the spread of infection °· Wash your hands often with soap and water for at least 20 seconds. If soap and water are not available, use alcohol-based hand sanitizer. °· Cough or sneeze into a tissue, sleeve, or elbow. Do not cough or sneeze into your hand or the air. °· If you cough or  sneeze into a tissue, throw it away immediately and wash your hands. °· Stay home unless you must get medical care. Call your health care provider or local health authority before you get medical care. °· Avoid public areas. Do not take public transportation, if possible. °· If you can, wear a mask if you must go out of the house or if you are in close contact with someone who is not sick. °Keep your home clean °· Disinfect objects and surfaces that are frequently touched every day. This may include: °? Counters and tables. °? Doorknobs and light switches. °? Sinks and faucets. °? Electronics such as phones, remote controls, keyboards, computers, and tablets. °· Wash dishes in hot, soapy water or use a dishwasher. Air-dry your dishes. °· Wash laundry in hot water. °Prevent infecting other household members °· Let healthy household members care for children and pets, if possible. If you have to care for children or pets, wash your hands often and wear a mask. °· Sleep in a different bedroom or bed, if possible. °· Do not share personal items, such as razors, toothbrushes, deodorant, combs, brushes, towels, and washcloths. °Where to find more information °Centers for Disease Control and Prevention (CDC) °· Information and news updates: www.cdc.gov/coronavirus/2019-ncov °World Health Organization (WHO) °· Information and news updates: www.who.int/emergencies/diseases/novel-coronavirus-2019 °· Coronavirus health topic: www.who.int/health-topics/coronavirus °· Questions and answers on COVID-19: www.who.int/news-room/q-a-detail/q-a-coronaviruses °· Global tracker: who.sprinklr.com °American Academy of Pediatrics (AAP) °· Information for families: www.healthychildren.org/English/health-issues/conditions/chest-lungs/Pages/2019-Novel-Coronavirus.aspx °The coronavirus situation is changing rapidly. Check your local health authority website or the CDC and WHO websites for updates and news. °When should I contact a health care  provider? °· Contact your health care provider if you have symptoms of an infection, such as fever or cough, and you: °? Have been near anyone who is known to have coronavirus disease. °? Have come into contact with a person who is suspected to have coronavirus disease. °? Have traveled outside of the country. °When should I get emergency medical care? °· Get help right away by calling your local emergency services (911 in the [place] if you have: °? Trouble breathing. °? Pain or pressure in your chest. °? Confusion. °? Blue-tinged lips and fingernails. °? Difficulty waking from sleep. °? Symptoms that get worse. °Let the emergency medical personnel know if you think you have coronavirus disease. °Summary °· A new respiratory virus is spreading from person to person and causing COVID-19 (coronavirus disease). °· The virus that causes COVID-19 appears to spread easily. It spreads from one person to another through droplets from coughing or sneezing. °· Older adults and those with chronic diseases are at higher risk of disease. If you are at higher risk for complications, take extra precautions. °· There is currently no vaccine to prevent coronavirus disease. There are no medicines, such as antibiotics or   antivirals, to treat the virus. °· You can protect yourself and your family by washing your hands often, avoiding touching your face, and covering your coughs and sneezes. °This information is not intended to replace advice given to you by your health care provider. Make sure you discuss any questions you have with your health care provider. °Document Released: 08/08/2018 Document Revised: 08/08/2018 Document Reviewed: 08/08/2018 °Elsevier Patient Education © 2020 [employer] ° °

## 2018-12-22 ENCOUNTER — Other Ambulatory Visit: Payer: Self-pay | Admitting: Family Medicine

## 2018-12-22 DIAGNOSIS — I10 Essential (primary) hypertension: Secondary | ICD-10-CM

## 2019-01-01 ENCOUNTER — Other Ambulatory Visit: Payer: Self-pay | Admitting: Family Medicine

## 2019-01-17 ENCOUNTER — Other Ambulatory Visit: Payer: Self-pay | Admitting: Family Medicine

## 2019-01-17 DIAGNOSIS — I10 Essential (primary) hypertension: Secondary | ICD-10-CM

## 2019-02-06 ENCOUNTER — Other Ambulatory Visit: Payer: Self-pay

## 2019-02-06 ENCOUNTER — Ambulatory Visit (INDEPENDENT_AMBULATORY_CARE_PROVIDER_SITE_OTHER): Payer: Medicare Other

## 2019-02-06 DIAGNOSIS — Z23 Encounter for immunization: Secondary | ICD-10-CM | POA: Diagnosis not present

## 2019-02-13 ENCOUNTER — Other Ambulatory Visit: Payer: Self-pay | Admitting: Family Medicine

## 2019-02-13 DIAGNOSIS — I10 Essential (primary) hypertension: Secondary | ICD-10-CM

## 2019-02-20 ENCOUNTER — Other Ambulatory Visit: Payer: Self-pay | Admitting: Family Medicine

## 2019-02-20 DIAGNOSIS — I10 Essential (primary) hypertension: Secondary | ICD-10-CM

## 2019-03-03 ENCOUNTER — Other Ambulatory Visit: Payer: Self-pay | Admitting: Family Medicine

## 2019-03-03 DIAGNOSIS — E1142 Type 2 diabetes mellitus with diabetic polyneuropathy: Secondary | ICD-10-CM

## 2019-03-05 NOTE — Telephone Encounter (Signed)
Requested medication (s) are due for refill today: yes  Requested medication (s) are on the active medication list: yes  Last refill:  11/27/2018  Future visit scheduled: yes  Notes to clinic:  Refill cannot be delegated   Requested Prescriptions  Pending Prescriptions Disp Refills   pregabalin (LYRICA) 150 MG capsule [Pharmacy Med Name: PREGABALIN 150 MG CAPSULE] 90 capsule     Sig: Take 1 capsule (150 mg total) by mouth every morning.     Not Delegated - Neurology:  Anticonvulsants - Controlled Failed - 03/03/2019  3:27 PM      Failed - This refill cannot be delegated      Passed - Valid encounter within last 12 months    Recent Outpatient Visits          3 months ago Controlled type 2 diabetes with neuropathy [facility])   [facility] [place], Drue Stager, MD   4 months ago Hyperlipidemia associated with type 2 diabetes mellitus [facility])   [facility] [place], Drue Stager, MD   6 months ago Hyperlipidemia associated with type 2 diabetes mellitus [facility])   [facility], Drue Stager, MD   9 months ago Hyperlipidemia associated with type 2 diabetes mellitus [facility])   [facility] [place], Drue Stager, MD   1 year ago 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

## 2019-03-16 ENCOUNTER — Other Ambulatory Visit: Payer: Self-pay | Admitting: Family Medicine

## 2019-03-16 DIAGNOSIS — I10 Essential (primary) hypertension: Secondary | ICD-10-CM

## 2019-04-06 ENCOUNTER — Encounter: Payer: Self-pay | Admitting: Family Medicine

## 2019-04-06 ENCOUNTER — Other Ambulatory Visit: Payer: Self-pay

## 2019-04-06 ENCOUNTER — Ambulatory Visit (INDEPENDENT_AMBULATORY_CARE_PROVIDER_SITE_OTHER): Payer: Medicare Other | Admitting: Family Medicine

## 2019-04-06 VITALS — BP 136/78 | HR 78 | Temp 97.5°F | Resp 16 | Ht 68.0 in | Wt 188.2 lb

## 2019-04-06 DIAGNOSIS — E1140 Type 2 diabetes mellitus with diabetic neuropathy, unspecified: Secondary | ICD-10-CM | POA: Diagnosis not present

## 2019-04-06 DIAGNOSIS — M545 Low back pain, unspecified: Secondary | ICD-10-CM

## 2019-04-06 DIAGNOSIS — E559 Vitamin D deficiency, unspecified: Secondary | ICD-10-CM

## 2019-04-06 DIAGNOSIS — E785 Hyperlipidemia, unspecified: Secondary | ICD-10-CM

## 2019-04-06 DIAGNOSIS — I10 Essential (primary) hypertension: Secondary | ICD-10-CM

## 2019-04-06 DIAGNOSIS — K5900 Constipation, unspecified: Secondary | ICD-10-CM

## 2019-04-06 DIAGNOSIS — E1159 Type 2 diabetes mellitus with other circulatory complications: Secondary | ICD-10-CM

## 2019-04-06 DIAGNOSIS — E1169 Type 2 diabetes mellitus with other specified complication: Secondary | ICD-10-CM

## 2019-04-06 LAB — POCT GLYCOSYLATED HEMOGLOBIN (HGB A1C): Hemoglobin A1C: 8 % — AB (ref 4.0–5.6)

## 2019-04-06 MED ORDER — HYDROCHLOROTHIAZIDE 12.5 MG PO TABS: 12.5000 mg | ORAL_TABLET | Freq: Every day | ORAL | 0 refills | Status: DC

## 2019-04-06 MED ORDER — TIZANIDINE HCL 2 MG PO CAPS: 2.0000 mg | ORAL_CAPSULE | Freq: Every evening | ORAL | 1 refills | Status: DC

## 2019-04-06 MED ORDER — INSULIN LISPRO PROT & LISPRO (75-25 MIX) 100 UNIT/ML KWIKPEN: PEN_INJECTOR | SUBCUTANEOUS | 2 refills | Status: DC

## 2019-04-06 MED ORDER — PREGABALIN 150 MG PO CAPS: 150.0000 mg | ORAL_CAPSULE | Freq: Every day | ORAL | 1 refills | Status: DC

## 2019-04-06 MED ORDER — ATORVASTATIN CALCIUM 10 MG PO TABS: 10.0000 mg | ORAL_TABLET | Freq: Every day | ORAL | 1 refills | Status: DC

## 2019-04-06 MED ORDER — LOSARTAN POTASSIUM 100 MG PO TABS: 100.0000 mg | ORAL_TABLET | Freq: Every day | ORAL | 1 refills | Status: DC

## 2019-04-06 NOTE — Patient Instructions (Signed)
We will stop Metformin  Increase insulin 70/30 to 28 units in am and 18 units before dinner and if you tolerate it well and sugar is not dropping you can try going up to 30 units in am and 20 at night after one week

## 2019-04-06 NOTE — Progress Notes (Signed)
Name: William Parks   MRN: [id number]    DOB: 10/11/1945   Date:04/06/2019       Progress Note  Subjective  Chief Complaint  Chief Complaint  Patient presents with  . Diabetes  . Hypertension  . Hyperlipidemia    HPI  DMII: he does not think he is taking Jardiance, he wants to stop Metformin 1500 mg daily - he states it causes indigestion and tired of taking a lot of pills,off Lantus and is now on Humalog Mix 75/25 25 unis in am and 15 units at night, he agrees on going up on dose of insulin, discussed triturating up slowly His hgbA1Chas gone up from 7.5% to 8 %. He denies polyphagia, polydipsia or polyuria. He could not tolerate SoliquaHe states Lyrica works well for his neuropathy, burning on his feet is under control but makes him sleepy, advised to take it at night instead of in the morning.  He is on statin ( but it was not on his medication bag today - he thinks he left it at home) he is also supposed to be taking losartan 100 mg but also not on his medication bad, he thinks he left it at home. FSBS checked at least once a day and glucose , last time he checked was two days ago and it was 189 fasting.   HTN:he is taking losartan 100 and metoprolol 25 BID and is doing well, hctz 12.5 mg, bp is at goal, but we are not sure if he took losartan today, we will contact pharmacy. No chest pain, palpitation of dizziness   Dyslipidemia: heis taking Atorvastatin ( but not on his medication bag)  and denies myalgia   Tobacco use: discussed importance of quitting, he states he does not have daily cough or SOB, discussed spirometry but he wants to hold off for now. Also discussed lung cancer screen, he is not interested. Unchanged  Constipation: he states he has intermittent constipation and is struggling with that now, he noticed abnormal cramping two days ago and wants to have a bowel movement but nothing comes out. He has been passing gas. His appetite is normal, pain is described  as mild discomfort. Discussed referral to gi for colonoscopy but he refuses. He will try otc Miralax and call back if no improvement   Intermittent low back pain: usually on left lower back sometimes radiculitis, no weakness, no bowel or bladder incontinence   Patient Active Problem List   Diagnosis Date Noted  . Neck and shoulder pain 11/08/2016  . Tobacco abuse counseling 10/25/2016  . Vitamin D deficiency 10/15/2015  . Dyslipidemia 06/02/2015  . Hypertension 04/01/2015  . Uncontrolled type 2 diabetes mellitus with peripheral neuropathy ([place]) 04/01/2015    Past Surgical History:  Procedure Laterality Date  . CYST REMOVAL TRUNK     2x  . SHOULDER SURGERY Right 2009   rotator cuff surgery    Family History  Problem Relation Age of Onset  . Cancer Mother        Breast CA  . Diabetes Mother   . Hypertension Mother   . Diabetes Brother     Social History   Socioeconomic History  . Marital status: Divorced    Spouse name: Not on file  . Number of children: 3  . Years of education: Not on file  . Highest education level: 10th grade  Occupational History  . Occupation: Retired  Tobacco Use  . Smoking status: Light Tobacco Smoker    Packs/day: 0.25  Years: 50.00    Pack years: 12.50    Types: Cigarettes    Start date: 07/27/1967  . Smokeless tobacco: Never Used  Substance and Sexual Activity  . Alcohol use: No    Alcohol/week: 0.0 standard drinks  . Drug use: No  . Sexual activity: Yes    Partners: Female  Other Topics Concern  . Not on file  Social History Narrative   Lives with girlfriend - [name] for the past 18 years.    He states we can discuss medical problems with her   [phone]   Social Determinants of Health   Financial Resource Strain:   . Difficulty of Paying Living Expenses: Not on file  Food Insecurity:   . Worried About Charity fundraiser in the Last Year: Not on file  . Ran Out of Food in the Last Year: Not on file   Transportation Needs:   . Lack of Transportation (Medical): Not on file  . Lack of Transportation (Non-Medical): Not on file  Physical Activity: Unknown  . Days of Exercise per Week: Not on file  . Minutes of Exercise per Session: 30 min  Stress: No Stress Concern Present  . Feeling of Stress : Not at all  Social Connections:   . Frequency of Communication with Friends and Family: Not on file  . Frequency of Social Gatherings with Friends and Family: Not on file  . Attends Religious Services: Not on file  . Active Member of Clubs or Organizations: Not on file  . Attends Archivist Meetings: Not on file  . Marital Status: Not on file  Intimate Partner Violence:   . Fear of Current or Ex-Partner: Not on file  . Emotionally Abused: Not on file  . Physically Abused: Not on file  . Sexually Abused: Not on file     Current Outpatient Medications:  .  aspirin 81 MG EC tablet, Take 81 mg by mouth daily., Disp: , Rfl: 5 .  atorvastatin (LIPITOR) 10 MG tablet, Take 1 tablet (10 mg total) by mouth daily at 6 PM., Disp: 90 tablet, Rfl: 1 .  blood glucose meter kit and supplies, Dispense based on patient and insurance preference. Use up to four times daily as directed. (FOR ICD-10 E10.9, E11.9)., Disp: 1 each, Rfl: 0 .  Cholecalciferol (VITAMIN D3) 2000 units TABS, Take by mouth., Disp: , Rfl:  .  docusate sodium (COLACE) 100 MG capsule, Take 100 mg by mouth daily., Disp: , Rfl:  .  hydrochlorothiazide (HYDRODIURIL) 12.5 MG tablet, Take 1 tablet (12.5 mg total) by mouth daily., Disp: 90 tablet, Rfl: 0 .  Insulin Lispro Prot & Lispro (HUMALOG MIX 75/25 KWIKPEN) (75-25) 100 UNIT/ML Kwikpen, INJECT INTO THE SKIN 2 (TWO) TIMES DAILY WITH A MEAL. 30 UNITS IN AM AND 20 UNITS BEFORE DINNER, Disp: 15 mL, Rfl: 2 .  Insulin Pen Needle 32G X 6 MM MISC, Inject 5 each into the skin daily. Dx: Well controlled type 2 diabetes mellitus with peripheral neuropathy  E11.42, Disp: 200 each, Rfl: 2 .   Lancets (ONETOUCH DELICA PLUS [id number]) MISC, TEST UP TO 4 TIMES A DAY, Disp: , Rfl:  .  losartan (COZAAR) 100 MG tablet, Take 1 tablet (100 mg total) by mouth daily., Disp: 90 tablet, Rfl: 1 .  metoprolol tartrate (LOPRESSOR) 25 MG tablet, TAKE 1 TABLET BY MOUTH TWICE A DAY, Disp: 180 tablet, Rfl: 1 .  ONETOUCH ULTRA test strip, TEST UP TO 4 TIMES A DAY, Disp: 100  strip, Rfl: 0 .  pregabalin (LYRICA) 150 MG capsule, Take 1 capsule (150 mg total) by mouth at bedtime., Disp: 90 capsule, Rfl: 1 .  tizanidine (ZANAFLEX) 2 MG capsule, Take 1 capsule (2 mg total) by mouth every evening., Disp: 90 capsule, Rfl: 1  No Known Allergies  I personally reviewed active problem list, medication list, allergies, family history, social history, health maintenance with the patient/caregiver today.   ROS  Constitutional: Negative for fever or weight change.  Respiratory: Negative for cough and shortness of breath.   Cardiovascular: Negative for chest pain or palpitations.  Gastrointestinal: Positive  for abdominal pain and bowel changes.  Musculoskeletal: Negative for gait problem or joint swelling.  Skin: Negative for rash.  Neurological: Negative for dizziness or headache.  No other specific complaints in a complete review of systems (except as listed in HPI above).  Objective  Vitals:   04/06/19 1341  BP: 136/78  Pulse: 78  Resp: 16  Temp: (!) 97.5 F (36.4 C)  TempSrc: Temporal  SpO2: 95%  Weight: 188 lb 3.2 oz (85.4 kg)  Height: _0  (1.727 m)    Body mass index is 28.62 kg/m.  Physical Exam  Constitutional: Patient appears well-developed and well-nourished. Overweight. No distress.  HEENT: head atraumatic, normocephalic, pupils equal and reactive to light Cardiovascular: Normal rate, regular rhythm and normal heart sounds.  No murmur heard. No BLE edema. Pulmonary/Chest: Effort normal and breath sounds normal. No respiratory distress. Abdominal: Soft.  There is no tenderness.  Normal bowel sounds  Psychiatric: Patient has a normal mood and affect. behavior is normal. Judgment and thought content normal.  Recent Results (from the past 2160 hour(s))  POCT HgB A1C     Status: Abnormal   Collection Time: 04/06/19  1:45 PM  Result Value Ref Range   Hemoglobin A1C 8.0 (A) 4.0 - 5.6 %   HbA1c POC (<> result, manual entry)     HbA1c, POC (prediabetic range)     HbA1c, POC (controlled diabetic range)      Diabetic Foot Exam: Diabetic Foot Exam - Simple   Simple Foot Form Diabetic Foot exam was performed with the following findings: Yes 04/06/2019  2:09 PM  Visual Inspection No deformities, no ulcerations, no other skin breakdown bilaterally: Yes Sensation Testing Intact to touch and monofilament testing bilaterally: Yes Pulse Check Posterior Tibialis and Dorsalis pulse intact bilaterally: Yes Comments      PHQ2/9: Depression screen [facility] 2/9 04/06/2019 12/04/2018 10/18/2018 09/05/2018 06/06/2018  Decreased Interest 0 0 0 0 0  Down, Depressed, Hopeless 0 0 0 0 0  PHQ - 2 Score 0 0 0 0 0  Altered sleeping 0 0 0 0 -  Tired, decreased energy 0 0 0 0 -  Change in appetite 0 0 0 0 -  Feeling bad or failure about yourself  0 0 0 0 -  Trouble concentrating 0 0 0 0 -  Moving slowly or fidgety/restless 0 0 0 0 -  Suicidal thoughts 0 0 0 0 -  PHQ-9 Score 0 0 0 0 -  Difficult doing work/chores - - - - -  Some recent data might be hidden    phq 9 is negative   Fall Risk: Fall Risk  04/06/2019 12/04/2018 10/18/2018 09/05/2018 05/04/2018  Falls in the past year? 0 0 0 0 0  Number falls in past yr: 0 0 0 0 0  Injury with Fall? 0 0 0 0 -  Follow up - - - - -  Functional Status Survey: Is the patient deaf or have difficulty hearing?: No Does the patient have difficulty seeing, even when wearing glasses/contacts?: No Does the patient have difficulty concentrating, remembering, or making decisions?: No Does the patient have difficulty walking or climbing stairs?:  No Does the patient have difficulty dressing or bathing?: No Does the patient have difficulty doing errands alone such as visiting a doctor's office or shopping?: No    Assessment & Plan   1. Controlled type 2 diabetes with neuropathy (HCC)  - POCT HgB A1C - Insulin Lispro Prot & Lispro (HUMALOG MIX 75/25 KWIKPEN) (75-25) 100 UNIT/ML Kwikpen; INJECT INTO THE SKIN 2 (TWO) TIMES DAILY WITH A MEAL. 30 UNITS IN AM AND 20 UNITS BEFORE DINNER  Dispense: 15 mL; Refill: 2 - pregabalin (LYRICA) 150 MG capsule; Take 1 capsule (150 mg total) by mouth at bedtime.  Dispense: 90 capsule; Refill: 1  2. Hyperlipidemia associated with type 2 diabetes mellitus (HCC)  - Insulin Lispro Prot & Lispro (HUMALOG MIX 75/25 KWIKPEN) (75-25) 100 UNIT/ML Kwikpen; INJECT INTO THE SKIN 2 (TWO) TIMES DAILY WITH A MEAL. 30 UNITS IN AM AND 20 UNITS BEFORE DINNER  Dispense: 15 mL; Refill: 2  3. Essential hypertension  - hydrochlorothiazide (HYDRODIURIL) 12.5 MG tablet; Take 1 tablet (12.5 mg total) by mouth daily.  Dispense: 90 tablet; Refill: 0  4. Dyslipidemia  - atorvastatin (LIPITOR) 10 MG tablet; Take 1 tablet (10 mg total) by mouth daily at 6 PM.  Dispense: 90 tablet; Refill: 1  5. Vitamin D deficiency  Continue vitamin D supplementation   6. Intermittent low back pain  - tizanidine (ZANAFLEX) 2 MG capsule; Take 1 capsule (2 mg total) by mouth every evening.  Dispense: 90 capsule; Refill: 1  7. Hypertension associated with type 2 diabetes mellitus ([place])

## 2019-04-07 LAB — COMPLETE METABOLIC PANEL WITH GFR
AG Ratio: 1.3 (calc) (ref 1.0–2.5)
ALT: 14 U/L (ref 9–46)
AST: 14 U/L (ref 10–35)
Albumin: 4.1 g/dL (ref 3.6–5.1)
Alkaline phosphatase (APISO): 61 U/L (ref 35–144)
BUN/Creatinine Ratio: 16 (calc) (ref 6–22)
BUN: 20 mg/dL (ref 7–25)
CO2: 28 mmol/L (ref 20–32)
Calcium: 9.7 mg/dL (ref 8.6–10.3)
Chloride: 103 mmol/L (ref 98–110)
Creat: 1.26 mg/dL — ABNORMAL HIGH (ref 0.70–1.18)
GFR, Est African American: 65 mL/min/{1.73_m2} (ref >=60)
GFR, Est Non African American: 56 mL/min/{1.73_m2} — ABNORMAL LOW (ref >=60)
Globulin: 3.2 g/dL (calc) (ref 1.9–3.7)
Glucose, Bld: 158 mg/dL — ABNORMAL HIGH (ref 65–99)
Potassium: 4.3 mmol/L (ref 3.5–5.3)
Sodium: 142 mmol/L (ref 135–146)
Total Bilirubin: 0.5 mg/dL (ref 0.2–1.2)
Total Protein: 7.3 g/dL (ref 6.1–8.1)

## 2019-04-07 LAB — MICROALBUMIN / CREATININE URINE RATIO
Creatinine, Urine: 109 mg/dL (ref 20–320)
Microalb Creat Ratio: 21 mcg/mg creat (ref <30)
Microalb, Ur: 2.3 mg/dL

## 2019-04-07 LAB — CBC WITH DIFFERENTIAL/PLATELET
Absolute Monocytes: 432 cells/uL (ref 200–950)
Basophils Absolute: 32 cells/uL (ref 0–200)
Basophils Relative: 0.7 %
Eosinophils Absolute: 41 cells/uL (ref 15–500)
Eosinophils Relative: 0.9 %
HCT: 49.3 % (ref 38.5–50.0)
Hemoglobin: 16.4 g/dL (ref 13.2–17.1)
Lymphs Abs: 1350 cells/uL (ref 850–3900)
MCH: 29 pg (ref 27.0–33.0)
MCHC: 33.3 g/dL (ref 32.0–36.0)
MCV: 87.1 fL (ref 80.0–100.0)
MPV: 11 fL (ref 7.5–12.5)
Monocytes Relative: 9.6 %
Neutro Abs: 2646 cells/uL (ref 1500–7800)
Neutrophils Relative %: 58.8 %
Platelets: 229 10*3/uL (ref 140–400)
RBC: 5.66 10*6/uL (ref 4.20–5.80)
RDW: 13.3 % (ref 11.0–15.0)
Total Lymphocyte: 30 %
WBC: 4.5 10*3/uL (ref 3.8–10.8)

## 2019-04-07 LAB — LIPID PANEL
Cholesterol: 197 mg/dL (ref <200)
HDL: 63 mg/dL (ref >=40)
LDL Cholesterol (Calc): 116 mg/dL (calc) — ABNORMAL HIGH
Non-HDL Cholesterol (Calc): 134 mg/dL (calc) — ABNORMAL HIGH (ref <130)
Total CHOL/HDL Ratio: 3.1 (calc) (ref <5.0)
Triglycerides: 81 mg/dL (ref <150)

## 2019-04-13 ENCOUNTER — Other Ambulatory Visit: Payer: Self-pay | Admitting: Family Medicine

## 2019-04-13 DIAGNOSIS — E1142 Type 2 diabetes mellitus with diabetic polyneuropathy: Secondary | ICD-10-CM

## 2019-05-10 ENCOUNTER — Ambulatory Visit: Payer: Medicare Other

## 2019-05-15 ENCOUNTER — Other Ambulatory Visit: Payer: Self-pay | Admitting: Family Medicine

## 2019-05-22 ENCOUNTER — Ambulatory Visit: Payer: Medicare Other

## 2019-05-24 ENCOUNTER — Ambulatory Visit (INDEPENDENT_AMBULATORY_CARE_PROVIDER_SITE_OTHER): Payer: Medicare Other

## 2019-05-24 ENCOUNTER — Other Ambulatory Visit: Payer: Self-pay

## 2019-05-24 VITALS — BP 128/74 | HR 70 | Temp 96.9°F | Resp 16 | Ht 68.0 in | Wt 193.2 lb

## 2019-05-24 DIAGNOSIS — Z0000 Encounter for general adult medical examination without abnormal findings: Secondary | ICD-10-CM | POA: Diagnosis not present

## 2019-05-24 NOTE — Patient Instructions (Signed)
Mr. William Parks , Thank you for taking time to come for your Medicare Wellness Visit. I appreciate your ongoing commitment to your health goals. Please review the following plan we discussed and let me know if I can assist you in the future.   Screening recommendations/referrals: Colonoscopy: postponed Recommended yearly ophthalmology/optometry visit for glaucoma screening and checkup Recommended yearly dental visit for hygiene and checkup  Vaccinations: Influenza vaccine: done 02/06/19 Pneumococcal vaccine: done 04/27/17 Tdap vaccine: due Shingles vaccine: due for second dose    Advanced directives: Advance directive discussed with you today. Even though you declined this today please call our office should you change your mind and we can give you the proper paperwork for you to fill out.  Conditions/risks identified: If you wish to quit smoking, help is available. For free tobacco cessation program offerings call the [facility] at (639) 139-0071 or Live Well Line at 819-039-6752. You may also visit [email or web address] or email [email or web address] for more information on other programs.   Next appointment: Please follow up in one year for your Medicare Annual Wellness visit.    Preventive Care 55 Years and Older, Male Preventive care refers to lifestyle choices and visits with your health care provider that can promote health and wellness. What does preventive care include?  A yearly physical exam. This is also called an annual well check.  Dental exams once or twice a year.  Routine eye exams. Ask your health care provider how often you should have your eyes checked.  Personal lifestyle choices, including:  Daily care of your teeth and gums.  Regular physical activity.  Eating a healthy diet.  Avoiding tobacco and drug use.  Limiting alcohol use.  Practicing safe sex.  Taking low doses of aspirin every day.  Taking vitamin and mineral supplements as  recommended by your health care provider. What happens during an annual well check? The services and screenings done by your health care provider during your annual well check will depend on your age, overall health, lifestyle risk factors, and family history of disease. Counseling  Your health care provider may ask you questions about your:  Alcohol use.  Tobacco use.  Drug use.  Emotional well-being.  Home and relationship well-being.  Sexual activity.  Eating habits.  History of falls.  Memory and ability to understand (cognition).  Work and work Astronomer. Screening  You may have the following tests or measurements:  Height, weight, and BMI.  Blood pressure.  Lipid and cholesterol levels. These may be checked every 5 years, or more frequently if you are over 85 years old.  Skin check.  Lung cancer screening. You may have this screening every year starting at age 45 if you have a 30-pack-year history of smoking and currently smoke or have quit within the past 15 years.  Fecal occult blood test (FOBT) of the stool. You may have this test every year starting at age 91.  Flexible sigmoidoscopy or colonoscopy. You may have a sigmoidoscopy every 5 years or a colonoscopy every 10 years starting at age 3.  Prostate cancer screening. Recommendations will vary depending on your family history and other risks.  Hepatitis C blood test.  Hepatitis B blood test.  Sexually transmitted disease (STD) testing.  Diabetes screening. This is done by checking your blood sugar (glucose) after you have not eaten for a while (fasting). You may have this done every 1-3 years.  Abdominal aortic aneurysm (AAA) screening. You may need this if you are a  current or former smoker.  Osteoporosis. You may be screened starting at age 16 if you are at high risk. Talk with your health care provider about your test results, treatment options, and if necessary, the need for more  tests. Vaccines  Your health care provider may recommend certain vaccines, such as:  Influenza vaccine. This is recommended every year.  Tetanus, diphtheria, and acellular pertussis (Tdap, Td) vaccine. You may need a Td booster every 10 years.  Zoster vaccine. You may need this after age 42.  Pneumococcal 13-valent conjugate (PCV13) vaccine. One dose is recommended after age 29.  Pneumococcal polysaccharide (PPSV23) vaccine. One dose is recommended after age 72. Talk to your health care provider about which screenings and vaccines you need and how often you need them. This information is not intended to replace advice given to you by your health care provider. Make sure you discuss any questions you have with your health care provider. Document Released: 05/09/2015 Document Revised: 12/31/2015 Document Reviewed: 02/11/2015 Elsevier Interactive Patient Education  2017 [place] Prevention in the Home Falls can cause injuries. They can happen to people of all ages. There are many things you can do to make your home safe and to help prevent falls. What can I do on the outside of my home?  Regularly fix the edges of walkways and driveways and fix any cracks.  Remove anything that might make you trip as you walk through a door, such as a raised step or threshold.  Trim any bushes or trees on the path to your home.  Use bright outdoor lighting.  Clear any walking paths of anything that might make someone trip, such as rocks or tools.  Regularly check to see if handrails are loose or broken. Make sure that both sides of any steps have handrails.  Any raised decks and porches should have guardrails on the edges.  Have any leaves, snow, or ice cleared regularly.  Use sand or salt on walking paths during winter.  Clean up any spills in your garage right away. This includes oil or grease spills. What can I do in the bathroom?  Use night lights.  Install grab bars by the  toilet and in the tub and shower. Do not use towel bars as grab bars.  Use non-skid mats or decals in the tub or shower.  If you need to sit down in the shower, use a plastic, non-slip stool.  Keep the floor dry. Clean up any water that spills on the floor as soon as it happens.  Remove soap buildup in the tub or shower regularly.  Attach bath mats securely with double-sided non-slip rug tape.  Do not have throw rugs and other things on the floor that can make you trip. What can I do in the bedroom?  Use night lights.  Make sure that you have a light by your bed that is easy to reach.  Do not use any sheets or blankets that are too big for your bed. They should not hang down onto the floor.  Have a firm chair that has side arms. You can use this for support while you get dressed.  Do not have throw rugs and other things on the floor that can make you trip. What can I do in the kitchen?  Clean up any spills right away.  Avoid walking on wet floors.  Keep items that you use a lot in easy-to-reach places.  If you need to reach something above  you, use a strong step stool that has a grab bar.  Keep electrical cords out of the way.  Do not use floor polish or wax that makes floors slippery. If you must use wax, use non-skid floor wax.  Do not have throw rugs and other things on the floor that can make you trip. What can I do with my stairs?  Do not leave any items on the stairs.  Make sure that there are handrails on both sides of the stairs and use them. Fix handrails that are broken or loose. Make sure that handrails are as long as the stairways.  Check any carpeting to make sure that it is firmly attached to the stairs. Fix any carpet that is loose or worn.  Avoid having throw rugs at the top or bottom of the stairs. If you do have throw rugs, attach them to the floor with carpet tape.  Make sure that you have a light switch at the top of the stairs and the bottom of  the stairs. If you do not have them, ask someone to add them for you. What else can I do to help prevent falls?  Wear shoes that:  Do not have high heels.  Have rubber bottoms.  Are comfortable and fit you well.  Are closed at the toe. Do not wear sandals.  If you use a stepladder:  Make sure that it is fully opened. Do not climb a closed stepladder.  Make sure that both sides of the stepladder are locked into place.  Ask someone to hold it for you, if possible.  Clearly mark and make sure that you can see:  Any grab bars or handrails.  First and last steps.  Where the edge of each step is.  Use tools that help you move around (mobility aids) if they are needed. These include:  Canes.  Walkers.  Scooters.  Crutches.  Turn on the lights when you go into a dark area. Replace any light bulbs as soon as they burn out.  Set up your furniture so you have a clear path. Avoid moving your furniture around.  If any of your floors are uneven, fix them.  If there are any pets around you, be aware of where they are.  Review your medicines with your doctor. Some medicines can make you feel dizzy. This can increase your chance of falling. Ask your doctor what other things that you can do to help prevent falls. This information is not intended to replace advice given to you by your health care provider. Make sure you discuss any questions you have with your health care provider. Document Released: 02/06/2009 Document Revised: 09/18/2015 Document Reviewed: 05/17/2014 Elsevier Interactive Patient Education  2017 [employer].

## 2019-05-24 NOTE — Progress Notes (Signed)
Subjective:   William Parks is a 74 y.o. male who presents for Medicare Annual/Subsequent preventive examination.  Review of Systems:   Cardiac Risk Factors include: advanced age (>38mn, >>31women);diabetes mellitus;dyslipidemia;male gender;hypertension;smoking/ tobacco exposure     Objective:    Vitals: BP 128/74 (BP Location: Right Arm, Patient Position: Sitting, Cuff Size: Normal)   Pulse 70   Temp (!) 96.9 F (36.1 C) (Temporal)   Resp 16   Ht 5' 8"  (1.727 m)   Wt 193 lb 3.2 oz (87.6 kg)   SpO2 96%   BMI 29.38 kg/m   Body mass index is 29.38 kg/m.  Advanced Directives 05/24/2019 05/04/2018 05/03/2017 10/25/2016 07/23/2016 06/30/2016 06/01/2016  Does Patient Have a Medical Advance Directive? No No No No No No No  Does patient want to make changes to medical advance directive? - - - - - - -  Would patient like information on creating a medical advance directive? No - Patient declined No - Patient declined Yes (MAU/Ambulatory/Procedural Areas - Information given) - - - -    Tobacco Social History   Tobacco Use  Smoking Status Light Tobacco Smoker  . Packs/day: 0.25  . Years: 50.00  . Pack years: 12.50  . Types: Cigarettes  . Start date: 07/27/1967  Smokeless Tobacco Never Used  Tobacco Comment   2-3 cigarettes per day, smoking cessation program information provided     Ready to quit: Yes Counseling given: Not Answered Comment: 2-3 cigarettes per day, smoking cessation program information provided   Clinical Intake:  Pre-visit preparation completed: Yes  Pain : No/denies pain     BMI - recorded: 29.38 Nutritional Status: BMI 25 -29 Overweight Nutritional Risks: None Diabetes: Yes CBG done?: No CBG resulted in Enter/ Edit results?: No Did pt. bring in CBG monitor from home?: No   Nutrition Risk Assessment:  Has the patient had any N/V/D within the last 2 months?  No  Does the patient have any non-healing wounds?  No  Has the patient had any unintentional  weight loss or weight gain?  No   Diabetes:  Is the patient diabetic?  Yes  If diabetic, was a CBG obtained today?  No  Did the patient bring in their glucometer from home?  No  How often do you monitor your CBG's? 1-2 times daily.   Financial Strains and Diabetes Management:  Are you having any financial strains with the device, your supplies or your medication? No .  Does the patient want to be seen by Chronic Care Management for management of their diabetes?  No  Would the patient like to be referred to a Nutritionist or for Diabetic Management?  No   Diabetic Exams:  Diabetic Eye Exam: Not completed. Overdue for diabetic eye exam. Pt has been advised about the importance in completing this exam.   Diabetic Foot Exam: Completed 04/06/19.    How often do you need to have someone help you when you read instructions, pamphlets, or other written materials from your doctor or pharmacy?: 1 - Never  Interpreter Needed?: No  Information entered by :: KClemetine MarkerLPN  Past Medical History:  Diagnosis Date  . Arthritis    rt shoulder  . Diabetes mellitus without complication ([facility]   . Hyperlipidemia   . Hyperlipidemia associated with type 2 diabetes mellitus ([facility] 06/02/2015  . Hypertension    Past Surgical History:  Procedure Laterality Date  . CYST REMOVAL TRUNK     2x  . SHOULDER SURGERY Right 2009  rotator cuff surgery   Family History  Problem Relation Age of Onset  . Cancer Mother        Breast CA  . Diabetes Mother   . Hypertension Mother   . Diabetes Brother    Social History   Socioeconomic History  . Marital status: Divorced    Spouse name: Not on file  . Number of children: 3  . Years of education: Not on file  . Highest education level: 10th grade  Occupational History  . Occupation: Retired  Tobacco Use  . Smoking status: Light Tobacco Smoker    Packs/day: 0.25    Years: 50.00    Pack years: 12.50    Types: Cigarettes    Start date: 07/27/1967   . Smokeless tobacco: Never Used  . Tobacco comment: 2-3 cigarettes per day, smoking cessation program information provided  Substance and Sexual Activity  . Alcohol use: No    Alcohol/week: 0.0 standard drinks  . Drug use: No  . Sexual activity: Yes    Partners: Female  Other Topics Concern  . Not on file  Social History Narrative   Lives with girlfriend - William Parks for the past 18 years.    He states we can discuss medical problems with her   [phone]   Social Determinants of Health   Financial Resource Strain: Low Risk   . Difficulty of Paying Living Expenses: Not hard at all  Food Insecurity: No Food Insecurity  . Worried About Charity fundraiser in the Last Year: Never true  . Ran Out of Food in the Last Year: Never true  Transportation Needs: No Transportation Needs  . Lack of Transportation (Medical): No  . Lack of Transportation (Non-Medical): No  Physical Activity: Sufficiently Active  . Days of Exercise per Week: 7 days  . Minutes of Exercise per Session: 30 min  Stress: No Stress Concern Present  . Feeling of Stress : Not at all  Social Connections: Somewhat Isolated  . Frequency of Communication with Friends and Family: More than three times a week  . Frequency of Social Gatherings with Friends and Family: Once a week  . Attends Religious Services: More than 4 times per year  . Active Member of Clubs or Organizations: No  . Attends Archivist Meetings: Never  . Marital Status: Divorced    Outpatient Encounter Medications as of 05/24/2019  Medication Sig  . aspirin 81 MG EC tablet Take 81 mg by mouth daily.  Marland Kitchen atorvastatin (LIPITOR) 10 MG tablet Take 1 tablet (10 mg total) by mouth daily at 6 PM.  . blood glucose meter kit and supplies Dispense based on patient and insurance preference. Use up to four times daily as directed. (FOR ICD-10 E10.9, E11.9).  Marland Kitchen Cholecalciferol (VITAMIN D3) 2000 units TABS Take by mouth.  . docusate sodium (COLACE)  100 MG capsule Take 100 mg by mouth daily.  . hydrochlorothiazide (HYDRODIURIL) 12.5 MG tablet Take 1 tablet (12.5 mg total) by mouth daily.  . Insulin Lispro Prot & Lispro (HUMALOG MIX 75/25 KWIKPEN) (75-25) 100 UNIT/ML Kwikpen INJECT INTO THE SKIN 2 (TWO) TIMES DAILY WITH A MEAL. 30 UNITS IN AM AND 20 UNITS BEFORE DINNER  . Lancets (ONETOUCH DELICA PLUS HCWCBJ62G) MISC TEST UP TO 4 TIMES A DAY  . losartan (COZAAR) 100 MG tablet Take 1 tablet (100 mg total) by mouth daily.  . metoprolol tartrate (LOPRESSOR) 25 MG tablet TAKE 1 TABLET BY MOUTH TWICE A DAY  . NOVOFINE 32G  X 6 MM MISC USE AS DIRECTED  . ONETOUCH ULTRA test strip TEST UP TO 4 TIMES A DAY  . pregabalin (LYRICA) 150 MG capsule Take 1 capsule (150 mg total) by mouth at bedtime.  . tizanidine (ZANAFLEX) 2 MG capsule Take 1 capsule (2 mg total) by mouth every evening. (Patient not taking: Reported on 05/24/2019)   No facility-administered encounter medications on file as of 05/24/2019.    Activities of Daily Living In your present state of health, do you have any difficulty performing the following activities: 05/24/2019 04/06/2019  Hearing? Y N  Comment declines hearing aids -  Vision? Y N  Difficulty concentrating or making decisions? N N  Walking or climbing stairs? N N  Dressing or bathing? N N  Doing errands, shopping? N N  Preparing Food and eating ? N -  Using the Toilet? N -  In the past six months, have you accidently leaked urine? N -  Do you have problems with loss of bowel control? N -  Managing your Medications? N -  Managing your Finances? N -  Housekeeping or managing your Housekeeping? N -  Some recent data might be hidden    Patient Care Team: Steele Sizer, MD as PCP - General (Family Medicine)   Assessment:   This is a routine wellness examination for [employer].  Exercise Activities and Dietary recommendations Current Exercise Habits: Home exercise routine, Type of exercise: walking, Time (Minutes): 30,  Frequency (Times/Week): 7, Weekly Exercise (Minutes/Week): 210, Intensity: Moderate, Exercise limited by: neurologic condition(s)  Goals    . "i want to lower my HgA1C" (pt-stated)     During face to face interview, patient states she has not been performing independent self health promoting activities for diabetes management. She is requesting guidance and information about self management with goal of lowering HgA1C by 2 points over the next 3 months.    Clinical Goal(s): Over the next 30 days, patient will:   Monitor and document cbg daily as evidenced by review of cbg documentation tool  Take all prescribed medications daily as evidenced by patient report and clinician review of adherence packs  Utilize plate method as carbohydrate portion control tool, as evidenced by patient report  Interventions: Face to face diabetes education, utilizing teach back method, including recommendations and rationale for daily cbg monitoring/recording, carb modified dietary counseling, medication review and counseling; Emmi educational materials provided to patient     . DIET - INCREASE WATER INTAKE     Recommend to drink at least 6-8 8 oz glasses of water per day     . Have 3 meals a day     Pt would like to increase meals to 3 times per day       Fall Risk Fall Risk  05/24/2019 04/06/2019 12/04/2018 10/18/2018 09/05/2018  Falls in the past year? 0 0 0 0 0  Number falls in past yr: 0 0 0 0 0  Injury with Fall? 0 0 0 0 0  Risk for fall due to : No Fall Risks - - - -  Follow up Falls prevention discussed - - - -   FALL RISK PREVENTION PERTAINING TO THE HOME:  Any stairs in or around the home? Yes If so, do they handrails? Yes  Home free of loose throw rugs in walkways, pet beds, electrical cords, etc? Yes  Adequate lighting in your home to reduce risk of falls? Yes   ASSISTIVE DEVICES UTILIZED TO PREVENT FALLS:  Life alert? No  Use of a cane, walker or w/c? Yes  -cane occasionally Grab  bars in the bathroom? No  Shower chair or bench in shower? No  Elevated toilet seat or a handicapped toilet? No   DME ORDERS:  DME order needed?  No   TIMED UP AND GO:  Was the test performed? Yes .  Length of time to ambulate 10 feet: 5 sec.   GAIT:  Appearance of gait: Gait stead-fast and without the use of an assistive device.   Education: Fall risk prevention has been discussed.  Intervention(s) required? No    Depression Screen PHQ 2/9 Scores 05/24/2019 04/06/2019 12/04/2018 10/18/2018  PHQ - 2 Score 0 0 0 0  PHQ- 9 Score - 0 0 0    Cognitive Function - Pt declined 6CIT for 2021 AWV     6CIT Screen 05/04/2018 05/03/2017 05/31/2016  What Year? 0 points 0 points 0 points  What month? 0 points 0 points 0 points  What time? 0 points 0 points 0 points  Count back from 20 0 points 0 points 0 points  Months in reverse 0 points 0 points 0 points  Repeat phrase 4 points 8 points 2 points  Total Score 4 8 2     Immunization History  Administered Date(s) Administered  . Fluad Quad(high Dose 65+) 02/06/2019  . Influenza, High Dose Seasonal PF 04/01/2015, 01/25/2017, 02/03/2018  . Influenza,inj,Quad PF,6+ Mos 01/07/2016  . Pneumococcal Conjugate-13 04/21/2016  . Pneumococcal Polysaccharide-23 04/27/2017  . Zoster Recombinat (Shingrix) 05/12/2018    Qualifies for Shingles Vaccine? Yes . Due for Shingrix. Education has been provided regarding the importance of this vaccine. Pt has been advised to call insurance company to determine out of pocket expense. Advised may also receive vaccine at local pharmacy or Health Dept. Verbalized acceptance and understanding.  Tdap: Although this vaccine is not a covered service during a Wellness Exam, does the patient still wish to receive this vaccine today?  No .  Education has been provided regarding the importance of this vaccine. Advised may receive this vaccine at local pharmacy or Health Dept. Aware to provide a copy of the vaccination  record if obtained from local pharmacy or Health Dept. Verbalized acceptance and understanding.  Flu Vaccine: Up to date  Pneumococcal Vaccine: Up to date   Screening Tests Health Maintenance  Topic Date Due  . TETANUS/TDAP  10/01/1963  . OPHTHALMOLOGY EXAM  04/26/2014  . Fecal DNA (Cologuard)  04/05/2020 (Originally 10/01/1994)  . HEMOGLOBIN A1C  10/05/2019  . FOOT EXAM  04/05/2020  . INFLUENZA VACCINE  Completed  . Hepatitis C Screening  Completed  . PNA vac Low Risk Adult  Completed   Cancer Screenings:  Colorectal Screening: Not completed. Postponed.  Lung Cancer Screening: (Low Dose CT Chest recommended if Age 72-80 years, 30 pack-year currently smoking OR have quit w/in 15years.) does not qualify.   Additional Screening:  Hepatitis C Screening: does qualify; Completed 06/01/16  Vision Screening: Recommended annual ophthalmology exams for early detection of glaucoma and other disorders of the eye. Is the patient up to date with their annual eye exam?  No  Who is the provider or what is the name of the office in which the pt attends annual eye exams? Not established   Dental Screening: Recommended annual dental exams for proper oral hygiene  Community Resource Referral:  CRR required this visit?  No       Plan:    I have personally reviewed and addressed the Medicare Annual  Wellness questionnaire and have noted the following in the patient's chart:  A. Medical and social history B. Use of alcohol, tobacco or illicit drugs  C. Current medications and supplements D. Functional ability and status E.  Nutritional status F.  Physical activity G. Advance directives H. List of other physicians I.  Hospitalizations, surgeries, and ER visits in previous 12 months J.  [place] such as hearing and vision if needed, cognitive and depression L. Referrals and appointments   In addition, I have reviewed and discussed with patient certain preventive protocols,  quality metrics, and best practice recommendations. A written personalized care plan for preventive services as well as general preventive health recommendations were provided to patient.   Signed,  Clemetine Marker, LPN Nurse Health Advisor   Nurse Notes: pt states he has been unable to get his tizanidine rx from [facility]. Advised it was confirmed received on 04/06/19 at [employer]. Pt to contact pharmacy and notify if they do not have it.   Also discussed possible CCM for elevated A1c and lipid panel. Pt declined at this time. Pt reports medication compliance.  Encouraged healthy diet and physical activity to help lower A1c.

## 2019-07-10 ENCOUNTER — Other Ambulatory Visit: Payer: Self-pay | Admitting: Family Medicine

## 2019-07-10 DIAGNOSIS — I10 Essential (primary) hypertension: Secondary | ICD-10-CM

## 2019-07-10 NOTE — Telephone Encounter (Signed)
Requested Prescriptions  Pending Prescriptions Disp Refills  . hydrochlorothiazide (HYDRODIURIL) 12.5 MG tablet [Pharmacy Med Name: HYDROCHLOROTHIAZIDE 12.5 MG TB] 90 tablet 0    Sig: TAKE 1 TABLET BY MOUTH EVERY DAY     Cardiovascular: Diuretics - Thiazide Failed - 07/10/2019  1:38 AM      Failed - Cr in normal range and within 360 days    Creat  Date Value Ref Range Status  04/06/2019 1.26 (H) 0.70 - 1.18 mg/dL Final    Comment:    For patients >35 years of age, the reference limit for Creatinine is approximately 13% higher for people identified as African-American. .    Creatinine, Urine  Date Value Ref Range Status  04/06/2019 109 20 - 320 mg/dL Final         Passed - Ca in normal range and within 360 days    Calcium  Date Value Ref Range Status  04/06/2019 9.7 8.6 - 10.3 mg/dL Final         Passed - K in normal range and within 360 days    Potassium  Date Value Ref Range Status  04/06/2019 4.3 3.5 - 5.3 mmol/L Final         Passed - Na in normal range and within 360 days    Sodium  Date Value Ref Range Status  04/06/2019 142 135 - 146 mmol/L Final  04/01/2015 138 136 - 144 mmol/L Final    Comment:    **Effective April 07, 2015 the reference interval**   for Sodium, Serum will be changing to:                                             134 - 144          Passed - Last BP in normal range    BP Readings from Last 1 Encounters:  05/24/19 128/74         Passed - Valid encounter within last 6 months    Recent Outpatient Visits          3 months ago Hypertension associated with type 2 diabetes mellitus [facility])   [facility] [place], Danna Hefty, MD   7 months ago Controlled type 2 diabetes with neuropathy [facility])   [facility] [place], Danna Hefty, MD   8 months ago Hyperlipidemia associated with type 2 diabetes mellitus [facility])   [facility] [place], Danna Hefty, MD   10 months ago Hyperlipidemia associated with type  2 diabetes mellitus [facility])   [facility] [place], Danna Hefty, MD   1 year ago Hyperlipidemia associated with type 2 diabetes mellitus [facility])   [facility], MD      Future Appointments            In 3 weeks Carlynn Purl, Danna Hefty, MD [facility]   In 10 months  [facility]

## 2019-08-06 ENCOUNTER — Ambulatory Visit: Payer: Medicare Other | Admitting: Family Medicine

## 2019-08-16 ENCOUNTER — Other Ambulatory Visit: Payer: Self-pay | Admitting: Family Medicine

## 2019-08-16 DIAGNOSIS — I10 Essential (primary) hypertension: Secondary | ICD-10-CM

## 2019-08-20 ENCOUNTER — Ambulatory Visit (INDEPENDENT_AMBULATORY_CARE_PROVIDER_SITE_OTHER): Payer: Medicare Other | Admitting: Family Medicine

## 2019-08-20 ENCOUNTER — Encounter: Payer: Self-pay | Admitting: Family Medicine

## 2019-08-20 ENCOUNTER — Other Ambulatory Visit: Payer: Self-pay

## 2019-08-20 VITALS — BP 130/80 | HR 75 | Temp 97.1°F | Resp 16 | Ht 68.0 in | Wt 186.2 lb

## 2019-08-20 DIAGNOSIS — E1140 Type 2 diabetes mellitus with diabetic neuropathy, unspecified: Secondary | ICD-10-CM | POA: Diagnosis not present

## 2019-08-20 DIAGNOSIS — E1169 Type 2 diabetes mellitus with other specified complication: Secondary | ICD-10-CM

## 2019-08-20 DIAGNOSIS — M545 Low back pain, unspecified: Secondary | ICD-10-CM

## 2019-08-20 DIAGNOSIS — E1165 Type 2 diabetes mellitus with hyperglycemia: Secondary | ICD-10-CM

## 2019-08-20 DIAGNOSIS — E785 Hyperlipidemia, unspecified: Secondary | ICD-10-CM | POA: Diagnosis not present

## 2019-08-20 DIAGNOSIS — I10 Essential (primary) hypertension: Secondary | ICD-10-CM

## 2019-08-20 LAB — POCT GLYCOSYLATED HEMOGLOBIN (HGB A1C): Hemoglobin A1C: 12.4 % — AB (ref 4.0–5.6)

## 2019-08-20 MED ORDER — LOSARTAN POTASSIUM 100 MG PO TABS: 100.0000 mg | ORAL_TABLET | Freq: Every day | ORAL | 1 refills | Status: DC

## 2019-08-20 MED ORDER — TIZANIDINE HCL 2 MG PO CAPS: 2.0000 mg | ORAL_CAPSULE | Freq: Every evening | ORAL | 1 refills | Status: DC

## 2019-08-20 MED ORDER — OZEMPIC (0.25 OR 0.5 MG/DOSE) 2 MG/1.5ML ~~LOC~~ SOPN: 0.5000 mg | PEN_INJECTOR | SUBCUTANEOUS | 2 refills | Status: DC

## 2019-08-20 MED ORDER — PREGABALIN 150 MG PO CAPS: 150.0000 mg | ORAL_CAPSULE | Freq: Every day | ORAL | 1 refills | Status: DC

## 2019-08-20 MED ORDER — METOPROLOL TARTRATE 25 MG PO TABS: 25.0000 mg | ORAL_TABLET | Freq: Two times a day (BID) | ORAL | 1 refills | Status: DC

## 2019-08-20 MED ORDER — ATORVASTATIN CALCIUM 10 MG PO TABS: 10.0000 mg | ORAL_TABLET | Freq: Every day | ORAL | 1 refills | Status: DC

## 2019-08-20 MED ORDER — INSULIN LISPRO PROT & LISPRO (75-25 MIX) 100 UNIT/ML KWIKPEN: PEN_INJECTOR | SUBCUTANEOUS | 2 refills | Status: DC

## 2019-08-20 MED ORDER — HYDROCHLOROTHIAZIDE 12.5 MG PO TABS: 12.5000 mg | ORAL_TABLET | Freq: Every day | ORAL | 0 refills | Status: DC

## 2019-08-20 NOTE — Progress Notes (Signed)
Name: William Parks   MRN: [id number]    DOB: 01-13-1946   Date:08/20/2019       Progress Note  Subjective  Chief Complaint  Chief Complaint  Patient presents with  . Hypertension  . Diabetes  . Dyslipidemia    HPI  DMII: he stop Jardiance but not sure why  he stopped  Metformin 1500 mg because of indigestion,off Lantus, he tried [place] and it caused indigestion. He is now on Humalog Mix 75/25 25 unis in am and 18 units at night.His hgbA1Chas gone up from 7.5% to 8 % and now 12.4% He has episodes of polyphagia, but no  polydipsia or polyuria. He states Lyrica works well for his neuropathy, he is taking medication at night now.  He is on statin and ARB. His glucose is very high in the 200's-300's He told me he thought his meter was broken. He has been baking cakes, but not sure what the big change was. He states he is complaint with medication. He is a poor historian.   HTN:he is taking losartan 100 and metoprolol 25 BID ,hctz 12.5 mg. No chest pain, palpitation of dizziness   Dyslipidemia: heis taking Atorvastatin, last LDL went up from 66 to 116 and medication was not on his bag during his last visit , but it is on his bag today. Continue medications denies myalgia.   Tobacco use: discussed importance of quitting, he states he does not have daily cough or SOB. We also discussed lung cancer screen, he is not interested.  Constipation: he states no longer having problems with his stomach   Intermittent low back pain: usually on left lower back sometimes radiculitis, no weakness, no bowel or bladder incontinence Unchanged  Stress: " I have a lot going on in my life, but I don't want to talk about it right now"   Malnutrition: he states he used to be in the 210 lbs range, but over the past year he was 195 lbs and gradually going down today at 188 lbs. Discussed colon cancer, lung cancer screen , also explained diabetes being out of control. He keeps bringing up stress also.  Advised glucerna shakes. He lives with girlfriend - her name is William Parks number [phone] and he allowed me to call her   Patient Active Problem List   Diagnosis Date Noted  . Neck and shoulder pain 11/08/2016  . Tobacco abuse counseling 10/25/2016  . Vitamin D deficiency 10/15/2015  . Dyslipidemia 06/02/2015  . Hypertension 04/01/2015  . Uncontrolled type 2 diabetes mellitus with peripheral neuropathy ([place]) 04/01/2015    Past Surgical History:  Procedure Laterality Date  . CYST REMOVAL TRUNK     2x  . SHOULDER SURGERY Right 2009   rotator cuff surgery    Family History  Problem Relation Age of Onset  . Cancer Mother        Breast CA  . Diabetes Mother   . Hypertension Mother   . Diabetes Brother     Social History   Tobacco Use  . Smoking status: Light Tobacco Smoker    Packs/day: 0.25    Years: 50.00    Pack years: 12.50    Types: Cigarettes    Start date: 07/27/1967  . Smokeless tobacco: Never Used  . Tobacco comment: 2-3 cigarettes per day, smoking cessation program information provided  Substance Use Topics  . Alcohol use: No    Alcohol/week: 0.0 standard drinks     Current Outpatient Medications:  .  aspirin 81 MG  EC tablet, Take 81 mg by mouth daily., Disp: , Rfl: 5 .  atorvastatin (LIPITOR) 10 MG tablet, Take 1 tablet (10 mg total) by mouth daily at 6 PM., Disp: 90 tablet, Rfl: 1 .  blood glucose meter kit and supplies, Dispense based on patient and insurance preference. Use up to four times daily as directed. (FOR ICD-10 E10.9, E11.9)., Disp: 1 each, Rfl: 0 .  Cholecalciferol (VITAMIN D3) 2000 units TABS, Take by mouth., Disp: , Rfl:  .  docusate sodium (COLACE) 100 MG capsule, Take 100 mg by mouth daily., Disp: , Rfl:  .  hydrochlorothiazide (HYDRODIURIL) 12.5 MG tablet, TAKE 1 TABLET BY MOUTH EVERY DAY, Disp: 90 tablet, Rfl: 0 .  Insulin Lispro Prot & Lispro (HUMALOG MIX 75/25 KWIKPEN) (75-25) 100 UNIT/ML Kwikpen, INJECT INTO THE SKIN 2 (TWO) TIMES DAILY  WITH A MEAL. 30 UNITS IN AM AND 20 UNITS BEFORE DINNER, Disp: 15 mL, Rfl: 2 .  Lancets (ONETOUCH DELICA PLUS [id number]) MISC, TEST UP TO 4 TIMES A DAY, Disp: , Rfl:  .  losartan (COZAAR) 100 MG tablet, Take 1 tablet (100 mg total) by mouth daily., Disp: 90 tablet, Rfl: 1 .  metoprolol tartrate (LOPRESSOR) 25 MG tablet, TAKE 1 TABLET BY MOUTH TWICE A DAY, Disp: 180 tablet, Rfl: 0 .  NOVOFINE 32G X 6 MM MISC, USE AS DIRECTED, Disp: 100 each, Rfl: 5 .  ONETOUCH ULTRA test strip, TEST UP TO 4 TIMES A DAY, Disp: 100 strip, Rfl: 0 .  pregabalin (LYRICA) 150 MG capsule, Take 1 capsule (150 mg total) by mouth at bedtime., Disp: 90 capsule, Rfl: 1 .  tizanidine (ZANAFLEX) 2 MG capsule, Take 1 capsule (2 mg total) by mouth every evening., Disp: 90 capsule, Rfl: 1  No Known Allergies  I personally reviewed active problem list, medication list, allergies, family history, social history, health maintenance, notes from last encounter with the patient/caregiver today.   ROS  Constitutional: Negative for fever or weight change.  Respiratory: Negative for cough and shortness of breath.   Cardiovascular: Negative for chest pain or palpitations.  Gastrointestinal: Negative for abdominal pain, no bowel changes.  Musculoskeletal: Negative for gait problem or joint swelling.  Skin: Negative for rash.  Neurological: Negative for dizziness or headache.  No other specific complaints in a complete review of systems (except as listed in HPI above).  Objective  Vitals:   08/20/19 1411  BP: 130/80  Pulse: 75  Resp: 16  Temp: (!) 97.1 F (36.2 C)  TempSrc: Temporal  SpO2: 94%  Weight: 186 lb 3.2 oz (84.5 kg)  Height: 5' 8"  (1.727 m)     Body mass index is 28.31 kg/m.  Physical Exam  Constitutional: Patient appears well-developed and well-nourished. Obese  No distress.  HEENT: head atraumatic, normocephalic, pupils equal and reactive to light Cardiovascular: Normal rate, regular rhythm and normal  heart sounds.  No murmur heard. No BLE edema. Pulmonary/Chest: Effort normal and breath sounds normal. No respiratory distress. Abdominal: Soft.  There is no tenderness. Psychiatric: Patient has a normal mood and affect. behavior is normal. Judgment and thought content normal.  Recent Results (from the past 2160 hour(s))  POCT HgB A1C     Status: Abnormal   Collection Time: 08/20/19  2:04 PM  Result Value Ref Range   Hemoglobin A1C 12.4 (A) 4.0 - 5.6 %   HbA1c POC (<> result, manual entry)     HbA1c, POC (prediabetic range)     HbA1c, POC (controlled diabetic range)  PHQ2/9: Depression screen [facility] 2/9 08/20/2019 05/24/2019 04/06/2019 12/04/2018 10/18/2018  Decreased Interest 0 0 0 0 0  Down, Depressed, Hopeless 0 0 0 0 0  PHQ - 2 Score 0 0 0 0 0  Altered sleeping 0 - 0 0 0  Tired, decreased energy 0 - 0 0 0  Change in appetite 0 - 0 0 0  Feeling bad or failure about yourself  0 - 0 0 0  Trouble concentrating 0 - 0 0 0  Moving slowly or fidgety/restless 0 - 0 0 0  Suicidal thoughts 0 - 0 0 0  PHQ-9 Score 0 - 0 0 0  Difficult doing work/chores - - - - -  Some recent data might be hidden    phq 9 is negative   Fall Risk: Fall Risk  08/20/2019 05/24/2019 04/06/2019 12/04/2018 10/18/2018  Falls in the past year? 0 0 0 0 0  Number falls in past yr: 0 0 0 0 0  Injury with Fall? 0 0 0 0 0  Risk for fall due to : - No Fall Risks - - -  Follow up - Falls prevention discussed - - -     Functional Status Survey: Is the patient deaf or have difficulty hearing?: No Does the patient have difficulty seeing, even when wearing glasses/contacts?: No Does the patient have difficulty concentrating, remembering, or making decisions?: No Does the patient have difficulty walking or climbing stairs?: No Does the patient have difficulty dressing or bathing?: No Does the patient have difficulty doing errands alone such as visiting a doctor's office or shopping?: No    Assessment &  Plan  1. Uncontrolled type 2 diabetes mellitus with hyperglycemia (HCC)  - POCT HgB A1C - Insulin Lispro Prot & Lispro (HUMALOG MIX 75/25 KWIKPEN) (75-25) 100 UNIT/ML Kwikpen; INJECT INTO THE SKIN 2 (TWO) TIMES DAILY WITH A MEAL. 32 UNITS IN AM AND 22 UNITS BEFORE DINNER  Dispense: 15 mL; Refill: 2 - Semaglutide,0.25 or 0.5MG/DOS, (OZEMPIC, 0.25 OR 0.5 MG/DOSE,) 2 MG/1.5ML SOPN; Inject 0.5 mg into the skin once a week. First two weeks take just 0.25 mg per week after that stay at 0.5  Dispense: 2 pen; Refill: 2  2. Hyperlipidemia associated with type 2 diabetes mellitus (HCC)  - Insulin Lispro Prot & Lispro (HUMALOG MIX 75/25 KWIKPEN) (75-25) 100 UNIT/ML Kwikpen; INJECT INTO THE SKIN 2 (TWO) TIMES DAILY WITH A MEAL. 32 UNITS IN AM AND 22 UNITS BEFORE DINNER  Dispense: 15 mL; Refill: 2 - Semaglutide,0.25 or 0.5MG/DOS, (OZEMPIC, 0.25 OR 0.5 MG/DOSE,) 2 MG/1.5ML SOPN; Inject 0.5 mg into the skin once a week. First two weeks take just 0.25 mg per week after that stay at 0.5  Dispense: 2 pen; Refill: 2  3. Essential hypertension  - hydrochlorothiazide (HYDRODIURIL) 12.5 MG tablet; Take 1 tablet (12.5 mg total) by mouth daily.  Dispense: 90 tablet; Refill: 0 - losartan (COZAAR) 100 MG tablet; Take 1 tablet (100 mg total) by mouth daily.  Dispense: 90 tablet; Refill: 1 - metoprolol tartrate (LOPRESSOR) 25 MG tablet; Take 1 tablet (25 mg total) by mouth 2 (two) times daily.  Dispense: 180 tablet; Refill: 1  4. Dyslipidemia  - atorvastatin (LIPITOR) 10 MG tablet; Take 1 tablet (10 mg total) by mouth daily at 6 PM.  Dispense: 90 tablet; Refill: 1  5. Uncontrolled  type 2 diabetes with neuropathy (HCC)  - pregabalin (LYRICA) 150 MG capsule; Take 1 capsule (150 mg total) by mouth at bedtime.  Dispense: 90 capsule;  Refill: 1  6. Intermittent low back pain  - tizanidine (ZANAFLEX) 2 MG capsule; Take 1 capsule (2 mg total) by mouth every evening.  Dispense: 90 capsule; Refill: 1

## 2019-08-20 NOTE — Patient Instructions (Signed)
Increase increase insulin to 32 in am and 22 units at night

## 2019-08-21 ENCOUNTER — Other Ambulatory Visit: Payer: Self-pay | Admitting: Family Medicine

## 2019-08-21 DIAGNOSIS — M545 Low back pain, unspecified: Secondary | ICD-10-CM

## 2019-08-21 NOTE — Telephone Encounter (Signed)
Requested medication (s) are due for refill today: no  Requested medication (s) are on the active medication list:yes  Last refill:  yesterday by Dr Carlynn Purl  Future visit scheduled: yes  Notes to clinic:  Pharmacy requesting alternative CAPS not covered.  Medication not delegated    Requested Prescriptions  Pending Prescriptions Disp Refills   tiZANidine (ZANAFLEX) 2 MG tablet [Pharmacy Med Name: TIZANIDINE HCL 2 MG TABLET] 30 tablet 0    Sig: Please specify directions, refills and quantity      Not Delegated - Cardiovascular:  Alpha-2 Agonists - tizanidine Failed - 08/21/2019  4:30 PM      Failed - This refill cannot be delegated      Passed - Valid encounter within last 6 months    Recent Outpatient Visits           Yesterday Uncontrolled type 2 diabetes mellitus with hyperglycemia [facility])   [facility] Alba Cory, MD   4 months ago Hypertension associated with type 2 diabetes mellitus [facility])   [facility] [place], Danna Hefty, MD   8 months ago Controlled type 2 diabetes with neuropathy [facility])   [facility] [place], Danna Hefty, MD   10 months ago Hyperlipidemia associated with type 2 diabetes mellitus [facility])   [facility] Alba Cory, MD   11 months ago Hyperlipidemia associated with type 2 diabetes mellitus [facility])   [facility] Alba Cory, MD       Future Appointments             In 1 month Carlynn Purl, Danna Hefty, MD [facility]   In 9 months  [facility]

## 2019-08-27 ENCOUNTER — Telehealth: Payer: Self-pay | Admitting: Family Medicine

## 2019-08-27 NOTE — Chronic Care Management (AMB) (Signed)
  Chronic Care Management   Outreach Note  08/27/2019 Name: William Parks MRN: [id number] DOB: [birth year]  William Parks is a 74 y.o. year old male who is a primary care patient of Alba Cory, MD. I reached out to Isaias Sakai by phone today in response to a referral sent by Mr. [employer] health plan.     An unsuccessful telephone outreach was attempted today. The patient was referred to the case management team for assistance with care management and care coordination.   Follow Up Plan: The care management team will reach out to the patient again over the next 7 days.  If patient returns call to provider office, please advise to call Embedded Care Management Care Guide Penne Lash at [phone]  Penne Lash, RMA Care Guide, Embedded Care Coordination [facility]  [place], [place] [address] Direct Dial: [phone] [email or web address]: [email or web address]

## 2019-08-31 NOTE — Chronic Care Management (AMB) (Signed)
  Chronic Care Management   Outreach Note  08/31/2019 Name: William Parks MRN: [id number] DOB: 1945/10/18  [name] is a 74 y.o. year old male who is a primary care patient of Alba Cory, MD. I reached out to Isaias Sakai by phone today in response to a referral sent by Mr. [employer] health plan.     A second unsuccessful telephone outreach was attempted today. The patient was referred to the case management team for assistance with care management and care coordination.   Follow Up Plan: A HIPPA compliant phone message was left for the patient providing contact information and requesting a return call.  The care management team will reach out to the patient again over the next 7 days.  If patient returns call to provider office, please advise to call Embedded Care Management Care Guide Penne Lash at [phone]  Penne Lash, RMA Care Guide, Embedded Care Coordination [facility]  [place], [place] [address] Direct Dial: [phone] [email or web address]: [email or web address]

## 2019-09-03 NOTE — Chronic Care Management (AMB) (Signed)
  Chronic Care Management   Note  09/03/2019 Name: William Parks MRN: [id number] DOB: July 03, 1945  Navy Belay is a 74 y.o. year old male who is a primary care patient of Steele Sizer, MD. I reached out to [name] by phone today in response to a referral sent by Mr. [employer] health plan.     Mr. Macauley was given information about Chronic Care Management services today including:  1. CCM service includes personalized support from designated clinical staff supervised by his physician, including individualized plan of care and coordination with other care providers 2. 24/7 contact phone numbers for assistance for urgent and routine care needs. 3. Service will only be billed when office clinical staff spend 20 minutes or more in a month to coordinate care. 4. Only one practitioner may furnish and bill the service in a calendar month. 5. The patient may stop CCM services at any time (effective at the end of the month) by phone call to the office staff. 6. The patient will be responsible for cost sharing (co-pay) of up to 20% of the service fee (after annual deductible is met).  Patient did not agree to enrollment in care management services and does not wish to consider at this time.  Follow up plan: The patient has been provided with contact information for the care management team and has been advised to call with any health related questions or concerns.   Noreene Larsson, [place], [place], [place] [address] Direct Dial: [phone] [email or web address] Website: [email or web address]

## 2019-10-03 ENCOUNTER — Other Ambulatory Visit: Payer: Self-pay

## 2019-10-03 ENCOUNTER — Ambulatory Visit (INDEPENDENT_AMBULATORY_CARE_PROVIDER_SITE_OTHER): Payer: Medicare Other | Admitting: Family Medicine

## 2019-10-03 ENCOUNTER — Encounter: Payer: Self-pay | Admitting: Family Medicine

## 2019-10-03 VITALS — BP 130/74 | HR 69 | Temp 97.5°F | Resp 16 | Ht 68.0 in | Wt 186.2 lb

## 2019-10-03 DIAGNOSIS — E785 Hyperlipidemia, unspecified: Secondary | ICD-10-CM

## 2019-10-03 DIAGNOSIS — E1169 Type 2 diabetes mellitus with other specified complication: Secondary | ICD-10-CM | POA: Diagnosis not present

## 2019-10-03 DIAGNOSIS — E1165 Type 2 diabetes mellitus with hyperglycemia: Secondary | ICD-10-CM | POA: Diagnosis not present

## 2019-10-03 DIAGNOSIS — I700 Atherosclerosis of aorta: Secondary | ICD-10-CM

## 2019-10-03 LAB — POCT GLYCOSYLATED HEMOGLOBIN (HGB A1C)

## 2019-10-03 MED ORDER — INSULIN LISPRO PROT & LISPRO (75-25 MIX) 100 UNIT/ML KWIKPEN: PEN_INJECTOR | SUBCUTANEOUS | 2 refills | Status: DC

## 2019-10-03 NOTE — Progress Notes (Signed)
Name: William Parks   MRN: [id number]    DOB: Aug 27, 1945   Date:10/03/2019       Progress Note  Subjective  Chief Complaint  Chief Complaint  Patient presents with  . Diabetes  . Hypertension  . Dyslipidemia    HPI  DMII: he was seen back in April and A1C was 12.4 %, we added Ozempic ( but he never took medication - worried about side effects ), he is on higher dose of 75/25  insulin 32 in am and 22 at night but he has not noticed any changes on glucose levels, around 180 fasting and 160 before dinner. He denies polyphagia, polydipsia or polyuria. He eats eggs, bacon, grits and toast for breakfast. For lunch he is eating cereal [employer], snacks he has apple sauce and fruit cocktail, for dinner home made meals. Last night pinto beans, rice, biscuits , gravy and pork chops. Discussed diabetic teaching class but he refuses, I will print a list of food groups he can eat and what to avoid , he will change his diet, increase insulin  to 36 in am and 24 at night   Atherosclerosis Aorta: on CT done 2014 , continue statin therapy and aspirin   He brought a handicap tag form to be filled out, states he has hip problems, explained nothing on his records and the form has to be justifiable, we will not fill it out it at this tim  Patient Active Problem List   Diagnosis Date Noted  . Atherosclerosis of aorta ([place]) 10/03/2019  . Neck and shoulder pain 11/08/2016  . Tobacco abuse counseling 10/25/2016  . Vitamin D deficiency 10/15/2015  . Dyslipidemia 06/02/2015  . Hypertension 04/01/2015  . Uncontrolled type 2 diabetes mellitus with peripheral neuropathy ([place]) 04/01/2015    Past Surgical History:  Procedure Laterality Date  . CYST REMOVAL TRUNK     2x  . SHOULDER SURGERY Right 2009   rotator cuff surgery    Family History  Problem Relation Age of Onset  . Cancer Mother        Breast CA  . Diabetes Mother   . Hypertension Mother   . Diabetes Brother     Social History   Tobacco  Use  . Smoking status: Light Tobacco Smoker    Packs/day: 0.25    Years: 50.00    Pack years: 12.50    Types: Cigarettes    Start date: 07/27/1967  . Smokeless tobacco: Never Used  . Tobacco comment: 2-3 cigarettes per day, smoking cessation program information provided  Substance Use Topics  . Alcohol use: No    Alcohol/week: 0.0 standard drinks     Current Outpatient Medications:  .  aspirin 81 MG EC tablet, Take 81 mg by mouth daily., Disp: , Rfl: 5 .  atorvastatin (LIPITOR) 10 MG tablet, Take 1 tablet (10 mg total) by mouth daily at 6 PM., Disp: 90 tablet, Rfl: 1 .  blood glucose meter kit and supplies, Dispense based on patient and insurance preference. Use up to four times daily as directed. (FOR ICD-10 E10.9, E11.9)., Disp: 1 each, Rfl: 0 .  Cholecalciferol (VITAMIN D3) 2000 units TABS, Take by mouth., Disp: , Rfl:  .  docusate sodium (COLACE) 100 MG capsule, Take 100 mg by mouth daily., Disp: , Rfl:  .  Insulin Lispro Prot & Lispro (HUMALOG MIX 75/25 KWIKPEN) (75-25) 100 UNIT/ML Kwikpen, INJECT INTO THE SKIN 2 (TWO) TIMES DAILY WITH A MEAL. 36 UNITS IN AM AND 24 UNITS  BEFORE DINNER, Disp: 15 mL, Rfl: 2 .  Lancets (ONETOUCH DELICA PLUS [id number]) MISC, TEST UP TO 4 TIMES A DAY, Disp: , Rfl:  .  metoprolol tartrate (LOPRESSOR) 25 MG tablet, Take 1 tablet (25 mg total) by mouth 2 (two) times daily., Disp: 180 tablet, Rfl: 1 .  NOVOFINE 32G X 6 MM MISC, USE AS DIRECTED, Disp: 100 each, Rfl: 5 .  ONETOUCH ULTRA test strip, TEST UP TO 4 TIMES A DAY, Disp: 100 strip, Rfl: 0 .  pregabalin (LYRICA) 150 MG capsule, Take 1 capsule (150 mg total) by mouth at bedtime., Disp: 90 capsule, Rfl: 1 .  tiZANidine (ZANAFLEX) 2 MG tablet, Please specify directions, refills and quantity, Disp: 30 tablet, Rfl: 0  No Known Allergies  I personally reviewed active problem list, medication list, allergies, family history, social history, health maintenance with the patient/caregiver  today.   ROS  Constitutional: Negative for fever or weight change.  Respiratory: Negative for cough and shortness of breath.   Cardiovascular: Negative for chest pain or palpitations.  Gastrointestinal: Negative for abdominal pain, no bowel changes.  Musculoskeletal: Negative for gait problem or joint swelling.  Skin: Negative for rash.  Neurological: Negative for dizziness or headache.  No other specific complaints in a complete review of systems (except as listed in HPI above).  Objective  Vitals:   10/03/19 1136  BP: 130/74  Pulse: 69  Resp: 16  Temp: (!) 97.5 F (36.4 C)  TempSrc: Temporal  SpO2: 96%  Weight: 186 lb 3.2 oz (84.5 kg)  Height: 5' 8"  (1.727 m)    Body mass index is 28.31 kg/m.  Physical Exam  Constitutional: Patient appears well-developed and well-nourished.  No distress.  HEENT: head atraumatic, normocephalic, pupils equal and reactive to light,  neck supple Cardiovascular: Normal rate, regular rhythm and normal heart sounds.  No murmur heard. No BLE edema. Pulmonary/Chest: Effort normal and breath sounds normal. No respiratory distress. Abdominal: Soft.  There is no tenderness. Psychiatric: Patient has a normal mood and affect. behavior is normal. Judgment and thought content normal.  Recent Results (from the past 2160 hour(s))  POCT HgB A1C     Status: Abnormal   Collection Time: 08/20/19  2:04 PM  Result Value Ref Range   Hemoglobin A1C 12.4 (A) 4.0 - 5.6 %   HbA1c POC (<> result, manual entry)     HbA1c, POC (prediabetic range)     HbA1c, POC (controlled diabetic range)    POCT HgB A1C     Status: Abnormal   Collection Time: 10/03/19 11:41 AM  Result Value Ref Range   Hemoglobin A1C 12.4 (A) 4.0 - 5.6 %   HbA1c POC (<> result, manual entry)     HbA1c, POC (prediabetic range)     HbA1c, POC (controlled diabetic range)      PHQ2/9: Depression screen [facility] 2/9 10/03/2019 08/20/2019 05/24/2019 04/06/2019 12/04/2018  Decreased Interest 0 0 0 0 0   Down, Depressed, Hopeless 0 0 0 0 0  PHQ - 2 Score 0 0 0 0 0  Altered sleeping 0 0 - 0 0  Tired, decreased energy 0 0 - 0 0  Change in appetite 0 0 - 0 0  Feeling bad or failure about yourself  0 0 - 0 0  Trouble concentrating 0 0 - 0 0  Moving slowly or fidgety/restless 0 0 - 0 0  Suicidal thoughts 0 0 - 0 0  PHQ-9 Score 0 0 - 0 0  Difficult doing work/chores - - - - -  Some recent data might be hidden    phq 9 is negative   Fall Risk: Fall Risk  10/03/2019 08/20/2019 05/24/2019 04/06/2019 12/04/2018  Falls in the past year? 0 0 0 0 0  Number falls in past yr: 0 0 0 0 0  Injury with Fall? 0 0 0 0 0  Risk for fall due to : - - No Fall Risks - -  Follow up - - Falls prevention discussed - -     Functional Status Survey: Is the patient deaf or have difficulty hearing?: No Does the patient have difficulty seeing, even when wearing glasses/contacts?: No Does the patient have difficulty concentrating, remembering, or making decisions?: No Does the patient have difficulty walking or climbing stairs?: No Does the patient have difficulty dressing or bathing?: No Does the patient have difficulty doing errands alone such as visiting a doctor's office or shopping?: No    Assessment & Plan  1. Uncontrolled type 2 diabetes mellitus with hyperglycemia (HCC)  - POCT HgB A1C - Insulin Lispro Prot & Lispro (HUMALOG MIX 75/25 KWIKPEN) (75-25) 100 UNIT/ML Kwikpen; INJECT INTO THE SKIN 2 (TWO) TIMES DAILY WITH A MEAL. 36 UNITS IN AM AND 24 UNITS BEFORE DINNER  Dispense: 15 mL; Refill: 2  2. Hyperlipidemia associated with type 2 diabetes mellitus (HCC)  - Insulin Lispro Prot & Lispro (HUMALOG MIX 75/25 KWIKPEN) (75-25) 100 UNIT/ML Kwikpen; INJECT INTO THE SKIN 2 (TWO) TIMES DAILY WITH A MEAL. 36 UNITS IN AM AND 24 UNITS BEFORE DINNER  Dispense: 15 mL; Refill: 2  3. Atherosclerosis of aorta [facility])  Discussed results, continue medical management

## 2019-11-30 ENCOUNTER — Other Ambulatory Visit: Payer: Self-pay | Admitting: Family Medicine

## 2019-11-30 DIAGNOSIS — M545 Low back pain, unspecified: Secondary | ICD-10-CM

## 2019-12-03 ENCOUNTER — Encounter: Payer: Self-pay | Admitting: Family Medicine

## 2019-12-03 ENCOUNTER — Ambulatory Visit (INDEPENDENT_AMBULATORY_CARE_PROVIDER_SITE_OTHER): Payer: Medicare Other | Admitting: Family Medicine

## 2019-12-03 ENCOUNTER — Other Ambulatory Visit: Payer: Self-pay

## 2019-12-03 DIAGNOSIS — M545 Low back pain, unspecified: Secondary | ICD-10-CM

## 2019-12-03 DIAGNOSIS — E785 Hyperlipidemia, unspecified: Secondary | ICD-10-CM

## 2019-12-03 DIAGNOSIS — I10 Essential (primary) hypertension: Secondary | ICD-10-CM

## 2019-12-03 DIAGNOSIS — E559 Vitamin D deficiency, unspecified: Secondary | ICD-10-CM

## 2019-12-03 DIAGNOSIS — IMO0002 Reserved for concepts with insufficient information to code with codable children: Secondary | ICD-10-CM

## 2019-12-03 DIAGNOSIS — E1165 Type 2 diabetes mellitus with hyperglycemia: Secondary | ICD-10-CM | POA: Diagnosis not present

## 2019-12-03 DIAGNOSIS — E1140 Type 2 diabetes mellitus with diabetic neuropathy, unspecified: Secondary | ICD-10-CM

## 2019-12-03 DIAGNOSIS — E1169 Type 2 diabetes mellitus with other specified complication: Secondary | ICD-10-CM | POA: Diagnosis not present

## 2019-12-03 DIAGNOSIS — I700 Atherosclerosis of aorta: Secondary | ICD-10-CM

## 2019-12-03 LAB — POCT GLYCOSYLATED HEMOGLOBIN (HGB A1C): Hemoglobin A1C: 11.1 % — AB (ref 4.0–5.6)

## 2019-12-03 MED ORDER — PREGABALIN 150 MG PO CAPS: 150.0000 mg | ORAL_CAPSULE | Freq: Every day | ORAL | 1 refills | Status: DC

## 2019-12-03 MED ORDER — DAPAGLIFLOZIN PROPANEDIOL 10 MG PO TABS
10.0000 mg | ORAL_TABLET | Freq: Every day | ORAL | 0 refills | Status: DC
Start: 2019-12-03 — End: 2020-02-24

## 2019-12-03 NOTE — Progress Notes (Signed)
Name: William Parks   MRN: [id number]    DOB: 06/14/1945   Date:12/03/2019       Progress Note  Subjective  Chief Complaint  Chief Complaint  Patient presents with  . Diabetes  . Follow-up    HPI  DMII: he was seen back in April and A1C was 12.4 %, today it is down to 11.1 % , we added Ozempic ( but he never took medication - worried about side effects ), he was advised to increase dose of insulin f 75/25  insulin 36 in am 24 units, but he is only taking 25 units bid,   he checks glucose most days , over the weekend it was 205. He denies polyphagia, polydipsia or polyuria. He eats eggs, bacon, sometimes bread, for snack he eats bologna sandwich, lemonade with sugar, for dinner he tries to eat protein but sill has hush puppies,he states he is trying to cut down on sweets. He refuses going to diabetic teaching class. He refuses resuming metformin but willing to go back on Jardiance  HTN:he is taking losartan 100 and metoprolol 25 BID ,hctz 12.5 mg. No chest pain, palpitation of dizziness. BP is at goal today   Dyslipidemia: heis taking Atorvastatin, last LDL went up from 66 to 116. Denies myalgia , we will recheck labs today   Tobacco use: discussed importance of quitting, he states he does not have daily cough or SOB. We also discussed lung cancer screen, he is not interested at this time   Intermittent low back pain: usually on left lower back sometimes radiculitis, no weakness, no bowel or bladder incontinenceHe states doing better since he has been wearing a back brace while working in his yard   Malnutrition: he states he used to be in the 210 lbs range, but over the past year he was 195 lbs and gradually going down but stable since last visit . Marland Kitchen Discussed colon cancer, lung cancer screen , also explained diabetes being out of control. He keeps bringing up stress also. Advised glucerna shakes. He lives with girlfriend - her name is [name] number [phone] , we called her last visit  but unable to connect with her. We will monitor, maybe the weight went down because diabetes has been out of control.   Patient Active Problem List   Diagnosis Date Noted  . Atherosclerosis of aorta ([place]) 10/03/2019  . Neck and shoulder pain 11/08/2016  . Tobacco abuse counseling 10/25/2016  . Vitamin D deficiency 10/15/2015  . Dyslipidemia 06/02/2015  . Hypertension 04/01/2015  . Uncontrolled type 2 diabetes mellitus with peripheral neuropathy ([place]) 04/01/2015    Past Surgical History:  Procedure Laterality Date  . CYST REMOVAL TRUNK     2x  . SHOULDER SURGERY Right 2009   rotator cuff surgery    Family History  Problem Relation Age of Onset  . Cancer Mother        Breast CA  . Diabetes Mother   . Hypertension Mother   . Diabetes Brother     Social History   Tobacco Use  . Smoking status: Light Tobacco Smoker    Packs/day: 0.25    Years: 50.00    Pack years: 12.50    Types: Cigarettes    Start date: 07/27/1967  . Smokeless tobacco: Never Used  . Tobacco comment: 2-3 cigarettes per day, smoking cessation program information provided  Substance Use Topics  . Alcohol use: No    Alcohol/week: 0.0 standard drinks     Current Outpatient  Medications:  .  aspirin 81 MG EC tablet, Take 81 mg by mouth daily., Disp: , Rfl: 5 .  atorvastatin (LIPITOR) 10 MG tablet, Take 1 tablet (10 mg total) by mouth daily at 6 PM., Disp: 90 tablet, Rfl: 1 .  blood glucose meter kit and supplies, Dispense based on patient and insurance preference. Use up to four times daily as directed. (FOR ICD-10 E10.9, E11.9)., Disp: 1 each, Rfl: 0 .  Cholecalciferol (VITAMIN D3) 2000 units TABS, Take by mouth., Disp: , Rfl:  .  docusate sodium (COLACE) 100 MG capsule, Take 100 mg by mouth daily., Disp: , Rfl:  .  hydrochlorothiazide (MICROZIDE) 12.5 MG capsule, Take 12.5 mg by mouth every morning., Disp: , Rfl:  .  Insulin Lispro Prot & Lispro (HUMALOG MIX 75/25 KWIKPEN) (75-25) 100 UNIT/ML Kwikpen,  INJECT INTO THE SKIN 2 (TWO) TIMES DAILY WITH A MEAL. 36 UNITS IN AM AND 24 UNITS BEFORE DINNER, Disp: 15 mL, Rfl: 2 .  Lancets (ONETOUCH DELICA PLUS [id number]) MISC, TEST UP TO 4 TIMES A DAY, Disp: , Rfl:  .  losartan (COZAAR) 100 MG tablet, Take 100 mg by mouth daily., Disp: , Rfl:  .  metoprolol tartrate (LOPRESSOR) 25 MG tablet, Take 1 tablet (25 mg total) by mouth 2 (two) times daily., Disp: 180 tablet, Rfl: 1 .  NOVOFINE 32G X 6 MM MISC, USE AS DIRECTED, Disp: 100 each, Rfl: 5 .  ONETOUCH ULTRA test strip, TEST UP TO 4 TIMES A DAY, Disp: 100 strip, Rfl: 0 .  pregabalin (LYRICA) 150 MG capsule, Take 1 capsule (150 mg total) by mouth at bedtime., Disp: 90 capsule, Rfl: 1 .  tiZANidine (ZANAFLEX) 2 MG tablet, TAKE 1 TABLET (2 MG TOTAL) BY MOUTH EVERY EVENING., Disp: 90 tablet, Rfl: 0  No Known Allergies  I personally reviewed active problem list, medication list, allergies, family history, social history, health maintenance with the patient/caregiver today.   ROS  Constitutional: Negative for fever or weight change.  Respiratory: Negative for cough and shortness of breath.   Cardiovascular: Negative for chest pain or palpitations.  Gastrointestinal: Negative for abdominal pain, no bowel changes.  Musculoskeletal: Negative for gait problem or joint swelling.  Skin: Negative for rash.  Neurological: Negative for dizziness or headache.  No other specific complaints in a complete review of systems (except as listed in HPI above).  Objective    Vitals:   12/03/19 1504  BP: 110/80  Pulse: 70  Resp: 16  Temp: 98.5 F (36.9 C)  TempSrc: Oral  SpO2: 99%  Weight: 189 lb (85.7 kg)  Height: 5' 8"  (1.727 m)    Body mass index is 28.74 kg/m.  Physical Exam  Constitutional: Patient appears well-developed and well-nourished. Overweight. No distress.  HEENT: head atraumatic, normocephalic, pupils equal and reactive to light,  neck supple Cardiovascular: Normal rate, regular rhythm  and normal heart sounds.  No murmur heard. No BLE edema. Pulmonary/Chest: Effort normal and breath sounds normal. No respiratory distress. Abdominal: Soft.  There is no tenderness. Psychiatric: Patient has a normal mood and affect. behavior is normal. Judgment and thought content normal.  Recent Results (from the past 2160 hour(s))  POCT HgB A1C     Status: None   Collection Time: 10/03/19 12:00 AM  Result Value Ref Range   Hemoglobin A1C     HbA1c POC (<> result, manual entry)     HbA1c, POC (prediabetic range)     HbA1c, POC (controlled diabetic range)    POCT  HgB A1C     Status: Abnormal   Collection Time: 12/03/19  3:09 PM  Result Value Ref Range   Hemoglobin A1C 11.1 (A) 4.0 - 5.6 %   HbA1c POC (<> result, manual entry)     HbA1c, POC (prediabetic range)     HbA1c, POC (controlled diabetic range)       PHQ2/9: Depression screen [facility] 2/9 12/03/2019 10/03/2019 08/20/2019 05/24/2019 04/06/2019  Decreased Interest 0 0 0 0 0  Down, Depressed, Hopeless 0 0 0 0 0  PHQ - 2 Score 0 0 0 0 0  Altered sleeping 0 0 0 - 0  Tired, decreased energy 0 0 0 - 0  Change in appetite 0 0 0 - 0  Feeling bad or failure about yourself  0 0 0 - 0  Trouble concentrating 0 0 0 - 0  Moving slowly or fidgety/restless 0 0 0 - 0  Suicidal thoughts 0 0 0 - 0  PHQ-9 Score 0 0 0 - 0  Difficult doing work/chores - - - - -  Some recent data might be hidden    phq 9 is negative   Fall Risk: Fall Risk  12/03/2019 10/03/2019 08/20/2019 05/24/2019 04/06/2019  Falls in the past year? 0 0 0 0 0  Number falls in past yr: 0 0 0 0 0  Injury with Fall? 0 0 0 0 0  Risk for fall due to : - - - No Fall Risks -  Follow up - - - Falls prevention discussed -     Assessment & Plan  1. Uncontrolled type 2 diabetes mellitus with hyperglycemia (HCC)  - POCT HgB A1C  2. Essential hypertension  BP is at goal   3. Intermittent low back pain  Stable , lyrica and tizanidine   4. Dyslipidemia  Recheck level   5.  Hyperlipidemia associated with type 2 diabetes mellitus (HCC)  - Lipid panel - COMPLETE METABOLIC PANEL WITH GFR  6. Atherosclerosis of aorta ([place])  On statin   7. Vitamin D deficiency  Discussed vitamin D supplementation   8. Unontrolled type 2 diabetes with neuropathy (HCC)  - pregabalin (LYRICA) 150 MG capsule; Take 1 capsule (150 mg total) by mouth at bedtime.  Dispense: 90 capsule; Refill: 1

## 2019-12-06 LAB — COMPLETE METABOLIC PANEL WITH GFR
AG Ratio: 1.5 (calc) (ref 1.0–2.5)
ALT: 24 U/L (ref 9–46)
AST: 19 U/L (ref 10–35)
Albumin: 4 g/dL (ref 3.6–5.1)
Alkaline phosphatase (APISO): 55 U/L (ref 35–144)
BUN: 16 mg/dL (ref 7–25)
CO2: 29 mmol/L (ref 20–32)
Calcium: 9.1 mg/dL (ref 8.6–10.3)
Chloride: 102 mmol/L (ref 98–110)
Creat: 1.16 mg/dL (ref 0.70–1.18)
GFR, Est African American: 71 mL/min/{1.73_m2} (ref >=60)
GFR, Est Non African American: 61 mL/min/{1.73_m2} (ref >=60)
Globulin: 2.6 g/dL (calc) (ref 1.9–3.7)
Glucose, Bld: 127 mg/dL — ABNORMAL HIGH (ref 65–99)
Potassium: 4.1 mmol/L (ref 3.5–5.3)
Sodium: 138 mmol/L (ref 135–146)
Total Bilirubin: 1.1 mg/dL (ref 0.2–1.2)
Total Protein: 6.6 g/dL (ref 6.1–8.1)

## 2019-12-06 LAB — FRUCTOSAMINE: Fructosamine: 345 umol/L — ABNORMAL HIGH (ref 205–285)

## 2019-12-06 LAB — LIPID PANEL
Cholesterol: 136 mg/dL (ref <200)
HDL: 52 mg/dL (ref >=40)
LDL Cholesterol (Calc): 69 mg/dL (calc)
Non-HDL Cholesterol (Calc): 84 mg/dL (calc) (ref <130)
Total CHOL/HDL Ratio: 2.6 (calc) (ref <5.0)
Triglycerides: 72 mg/dL (ref <150)

## 2019-12-09 ENCOUNTER — Other Ambulatory Visit: Payer: Self-pay | Admitting: Family Medicine

## 2019-12-09 DIAGNOSIS — I10 Essential (primary) hypertension: Secondary | ICD-10-CM

## 2019-12-09 NOTE — Telephone Encounter (Signed)
Requested medications are due for refill today?  Listed as a historical medication.    Requested medications are on active medication list? Yes as a historical medication.    Last Refill:   Unknown.   Future visit scheduled?  Yes in two months.    Notes to Clinic:   During office visit 10/03/2019 this medication was discontinued by Dr. Carlynn Purl due to change in therapy.  During the 12/03/2019 this medication was added back to the medication list as a historical medication.  Please review and re-order if appropriate.

## 2020-01-23 DIAGNOSIS — E083293 Diabetes mellitus due to underlying condition with mild nonproliferative diabetic retinopathy without macular edema, bilateral: Secondary | ICD-10-CM | POA: Diagnosis not present

## 2020-01-23 DIAGNOSIS — Z0101 Encounter for examination of eyes and vision with abnormal findings: Secondary | ICD-10-CM | POA: Diagnosis not present

## 2020-01-29 ENCOUNTER — Telehealth: Payer: Self-pay

## 2020-01-29 NOTE — Telephone Encounter (Signed)
Copied from CRM [id number]. Topic: General - Call Back - No Documentation >> Jan 28, 2020  1:57 PM William Parks wrote: PT returning call / please advise

## 2020-02-24 ENCOUNTER — Other Ambulatory Visit: Payer: Self-pay | Admitting: Family Medicine

## 2020-02-24 DIAGNOSIS — M545 Low back pain, unspecified: Secondary | ICD-10-CM

## 2020-03-04 NOTE — Progress Notes (Signed)
Name: William Parks   MRN: [id number]    DOB: 10/12/1945   Date:03/05/2020       Progress Note  Subjective  Chief Complaint  Follow up   HPI    DMII: he was seen back in April and A1C was 12.4 %,  In August 2021 was 11.1 % and today s down to 9.2 % . He is now on dose of insulin  75/25 he is still only using 25 in am and 25 in pm, he was also given Farxiga but he states he is afraid of losing weight, explained it will help get glucose at goal. He was also  advised to go up to 30-25 and monitor. He checks glucose most mornings and glucose has been 89-286 , most mornings is low 100's  He denies polyphagia, polydipsia or polyuria. He continues to avoid sweets, he does not like sandwiches.  HTN:he is taking losartan 100 and metoprolol 25 BID ,he stopped taking hctz - his daughter told him to stop, since bp is at goal and he will start Farxiga we will keep him off hctz  No chest pain, palpitation of dizzinessContinue current medications   Dyslipidemia: heis taking Atorvastatin, last LDL went up from 66 to 116 but last visit it was down to 69 again, continue medications   Tobacco use: discussed importance of quitting, he states he does not have daily cough or SOB. We also discussed lung cancer screen, not interested   Intermittent low back pain: usually on left lower back sometimes radiculitis, no weakness, no bowel or bladder incontinenceHe states doing better since he has been wearing a back brace while working in his yard, or doing any physical work at home   Malnutrition: he states he used to be in the 210 lbs range, but it has been stable over the past few visits . Discussed colon cancer, lung cancer screen , also explained diabetes being out of control. He keeps bringing up stress also. Advised glucerna shakes. He lives with girlfriend - her name is Eunice number [phone]. Today he seems more engaged.   Atherosclerosis of aorta: on statin and aspirin daily   Patient Active Problem  List   Diagnosis Date Noted  . Atherosclerosis of aorta (HCC) 10/03/2019  . Neck and shoulder pain 11/08/2016  . Tobacco abuse counseling 10/25/2016  . Vitamin D deficiency 10/15/2015  . Dyslipidemia 06/02/2015  . Hypertension 04/01/2015  . Uncontrolled type 2 diabetes mellitus with peripheral neuropathy ([facility]) 04/01/2015    Past Surgical History:  Procedure Laterality Date  . CYST REMOVAL TRUNK     2x  . SHOULDER SURGERY Right 2009   rotator cuff surgery    Family History  Problem Relation Age of Onset  . Cancer Mother        Breast CA  . Diabetes Mother   . Hypertension Mother   . Diabetes Brother     Social History   Tobacco Use  . Smoking status: Light Tobacco Smoker    Packs/day: 0.25    Years: 50.00    Pack years: 12.50    Types: Cigarettes    Start date: 07/27/1967  . Smokeless tobacco: Never Used  . Tobacco comment: 2-3 cigarettes per day, smoking cessation program information provided  Substance Use Topics  . Alcohol use: No    Alcohol/week: 0.0 standard drinks     Current Outpatient Medications:  .  aspirin 81 MG EC tablet, Take 81 mg by mouth daily., Disp: , Rfl: 5 .  atorvastatin (  LIPITOR) 10 MG tablet, Take 1 tablet (10 mg total) by mouth daily at 6 PM., Disp: 90 tablet, Rfl: 1 .  blood glucose meter kit and supplies, Dispense based on patient and insurance preference. Use up to four times daily as directed. (FOR ICD-10 E10.9, E11.9)., Disp: 1 each, Rfl: 0 .  Cholecalciferol (VITAMIN D3) 2000 units TABS, Take by mouth., Disp: , Rfl:  .  docusate sodium (COLACE) 100 MG capsule, Take 100 mg by mouth daily., Disp: , Rfl:  .  Insulin Lispro Prot & Lispro (HUMALOG MIX 75/25 KWIKPEN) (75-25) 100 UNIT/ML Kwikpen, INJECT INTO THE SKIN 2 (TWO) TIMES DAILY WITH A MEAL. 36 UNITS IN AM AND 24 UNITS BEFORE DINNER, Disp: 15 mL, Rfl: 2 .  Insulin Pen Needle 32G X 6 MM MISC, SMARTSIG:SUB-Q As Directed, Disp: , Rfl:  .  Lancets (ONETOUCH DELICA PLUS [id number]) MISC, TEST  UP TO 4 TIMES A DAY, Disp: , Rfl:  .  losartan (COZAAR) 100 MG tablet, Take 100 mg by mouth daily., Disp: , Rfl:  .  metoprolol tartrate (LOPRESSOR) 25 MG tablet, Take 1 tablet (25 mg total) by mouth 2 (two) times daily., Disp: 180 tablet, Rfl: 1 .  NOVOFINE 32G X 6 MM MISC, USE AS DIRECTED, Disp: 100 each, Rfl: 5 .  ONETOUCH ULTRA test strip, TEST UP TO 4 TIMES A DAY, Disp: 100 strip, Rfl: 0 .  pregabalin (LYRICA) 150 MG capsule, Take 1 capsule (150 mg total) by mouth at bedtime., Disp: 90 capsule, Rfl: 1 .  tiZANidine (ZANAFLEX) 2 MG tablet, TAKE 1 TABLET (2 MG TOTAL) BY MOUTH EVERY EVENING., Disp: 90 tablet, Rfl: 0 .  FARXIGA 10 MG TABS tablet, TAKE 1 TABLET (10 MG TOTAL) BY MOUTH DAILY BEFORE BREAKFAST. (Patient not taking: Reported on 03/05/2020), Disp: 90 tablet, Rfl: 0 .  hydrochlorothiazide (MICROZIDE) 12.5 MG capsule, Take 12.5 mg by mouth every morning. (Patient not taking: Reported on 03/05/2020), Disp: , Rfl:   No Known Allergies  I personally reviewed active problem list, medication list, allergies, family history, social history, health maintenance with the patient/caregiver today.   ROS  Constitutional: Negative for fever or weight change.  Respiratory: Negative for cough and shortness of breath.   Cardiovascular: Negative for chest pain or palpitations.  Gastrointestinal: Negative for abdominal pain, no bowel changes.  Musculoskeletal: Negative for gait problem or joint swelling.  Skin: Negative for rash.  Neurological: Negative for dizziness or headache.  No other specific complaints in a complete review of systems (except as listed in HPI above).  Objective  Vitals:   03/05/20 1020  BP: 124/86  Pulse: 78  Resp: 16  Temp: 98 F (36.7 C)  TempSrc: Oral  SpO2: 98%  Weight: 190 lb 14.4 oz (86.6 kg)  Height: 5' 8" (1.727 m)    Body mass index is 29.03 kg/m.  Physical Exam  Constitutional: Patient appears well-developed and well-nourished. No distress.   HEENT: head atraumatic, normocephalic, pupils equal and reactive to light, neck supple Cardiovascular: Normal rate, regular rhythm and normal heart sounds.  No murmur heard. No BLE edema. Pulmonary/Chest: Effort normal and breath sounds normal. No respiratory distress. Abdominal: Soft.  There is no tenderness. Psychiatric: Patient has a normal mood and affect. behavior is normal. Judgment and thought content normal.  Recent Results (from the past 2160 hour(s))  POCT HgB A1C     Status: Abnormal   Collection Time: 03/05/20 10:36 AM  Result Value Ref Range   Hemoglobin A1C 9.2 (A) 4.0 -  5.6 %   HbA1c POC (<> result, manual entry)     HbA1c, POC (prediabetic range)     HbA1c, POC (controlled diabetic range)        PHQ2/9: Depression screen PHQ 2/9 03/05/2020 12/03/2019 10/03/2019 08/20/2019 05/24/2019  Decreased Interest 0 0 0 0 0  Down, Depressed, Hopeless 0 0 0 0 0  PHQ - 2 Score 0 0 0 0 0  Altered sleeping - 0 0 0 -  Tired, decreased energy - 0 0 0 -  Change in appetite - 0 0 0 -  Feeling bad or failure about yourself  - 0 0 0 -  Trouble concentrating - 0 0 0 -  Moving slowly or fidgety/restless - 0 0 0 -  Suicidal thoughts - 0 0 0 -  PHQ-9 Score - 0 0 0 -  Some recent data might be hidden    phq 9 is negative   Fall Risk: Fall Risk  03/05/2020 12/03/2019 10/03/2019 08/20/2019 05/24/2019  Falls in the past year? 0 0 0 0 0  Number falls in past yr: 0 0 0 0 0  Injury with Fall? 0 0 0 0 0  Risk for fall due to : - - - - No Fall Risks  Follow up - - - - Falls prevention discussed     Functional Status Survey: Is the patient deaf or have difficulty hearing?: Yes Does the patient have difficulty seeing, even when wearing glasses/contacts?: No Does the patient have difficulty concentrating, remembering, or making decisions?: No Does the patient have difficulty walking or climbing stairs?: No Does the patient have difficulty dressing or bathing?: No Does the patient have difficulty  doing errands alone such as visiting a doctor's office or shopping?: No    Assessment & Plan  1. Uncontrolled type 2 diabetes mellitus with hyperglycemia (HCC)  - POCT HgB A1C - Insulin Lispro Prot & Lispro (HUMALOG MIX 75/25 KWIKPEN) (75-25) 100 UNIT/ML Kwikpen; INJECT INTO THE SKIN 2 (TWO) TIMES DAILY WITH A MEAL. 30 UNITS IN AM AND 25 UNITS BEFORE DINNER  Dispense: 15 mL; Refill: 2  2. Need for immunization against influenza  - Flu Vaccine QUAD High Dose(Fluad)  3. Essential hypertension  - losartan (COZAAR) 100 MG tablet; Take 1 tablet (100 mg total) by mouth daily.  Dispense: 90 tablet; Refill: 1 - metoprolol tartrate (LOPRESSOR) 25 MG tablet; Take 1 tablet (25 mg total) by mouth 2 (two) times daily.  Dispense: 180 tablet; Refill: 1  4. Intermittent low back pain   5. Dyslipidemia  - atorvastatin (LIPITOR) 10 MG tablet; Take 1 tablet (10 mg total) by mouth daily at 6 PM.  Dispense: 90 tablet; Refill: 1  6. Vitamin D deficiency   7. Atherosclerosis of aorta (HCC)  - Insulin Lispro Prot & Lispro (HUMALOG MIX 75/25 KWIKPEN) (75-25) 100 UNIT/ML Kwikpen; INJECT INTO THE SKIN 2 (TWO) TIMES DAILY WITH A MEAL. 30 UNITS IN AM AND 25 UNITS BEFORE DINNER  Dispense: 15 mL; Refill: 2  8. Hyperlipidemia associated with type 2 diabetes mellitus (HCC)  - Insulin Lispro Prot & Lispro (HUMALOG MIX 75/25 KWIKPEN) (75-25) 100 UNIT/ML Kwikpen; INJECT INTO THE SKIN 2 (TWO) TIMES DAILY WITH A MEAL. 30 UNITS IN AM AND 25 UNITS BEFORE DINNER  Dispense: 15 mL; Refill: 2 

## 2020-03-05 ENCOUNTER — Ambulatory Visit (INDEPENDENT_AMBULATORY_CARE_PROVIDER_SITE_OTHER): Payer: Medicare Other | Admitting: Family Medicine

## 2020-03-05 ENCOUNTER — Encounter: Payer: Self-pay | Admitting: Family Medicine

## 2020-03-05 ENCOUNTER — Other Ambulatory Visit: Payer: Self-pay

## 2020-03-05 VITALS — BP 124/86 | HR 78 | Temp 98.0°F | Resp 16 | Ht 68.0 in | Wt 190.9 lb

## 2020-03-05 DIAGNOSIS — I10 Essential (primary) hypertension: Secondary | ICD-10-CM

## 2020-03-05 DIAGNOSIS — M545 Low back pain, unspecified: Secondary | ICD-10-CM

## 2020-03-05 DIAGNOSIS — Z23 Encounter for immunization: Secondary | ICD-10-CM

## 2020-03-05 DIAGNOSIS — E559 Vitamin D deficiency, unspecified: Secondary | ICD-10-CM

## 2020-03-05 DIAGNOSIS — E1165 Type 2 diabetes mellitus with hyperglycemia: Secondary | ICD-10-CM

## 2020-03-05 DIAGNOSIS — I700 Atherosclerosis of aorta: Secondary | ICD-10-CM

## 2020-03-05 DIAGNOSIS — E785 Hyperlipidemia, unspecified: Secondary | ICD-10-CM

## 2020-03-05 DIAGNOSIS — E1169 Type 2 diabetes mellitus with other specified complication: Secondary | ICD-10-CM

## 2020-03-05 LAB — POCT GLYCOSYLATED HEMOGLOBIN (HGB A1C): Hemoglobin A1C: 9.2 % — AB (ref 4.0–5.6)

## 2020-03-05 MED ORDER — ATORVASTATIN CALCIUM 10 MG PO TABS: 10.0000 mg | ORAL_TABLET | Freq: Every day | ORAL | 1 refills | Status: DC

## 2020-03-05 MED ORDER — LOSARTAN POTASSIUM 100 MG PO TABS: 100.0000 mg | ORAL_TABLET | Freq: Every day | ORAL | 1 refills | Status: DC

## 2020-03-05 MED ORDER — INSULIN LISPRO PROT & LISPRO (75-25 MIX) 100 UNIT/ML KWIKPEN: PEN_INJECTOR | SUBCUTANEOUS | 2 refills | Status: DC

## 2020-03-05 MED ORDER — METOPROLOL TARTRATE 25 MG PO TABS: 25.0000 mg | ORAL_TABLET | Freq: Two times a day (BID) | ORAL | 1 refills | Status: DC

## 2020-03-05 NOTE — Patient Instructions (Signed)
Go up on insulin to 30 units in the morning and continue 25 at night

## 2020-03-18 ENCOUNTER — Ambulatory Visit: Payer: Self-pay | Admitting: *Deleted

## 2020-03-18 NOTE — Telephone Encounter (Signed)
Patient called to complain of pain in his right side rib cage area and right shoulder say its been about 3 days. Asking for a call back to know what he can do Ph# [phone]   Patient is reporting pain in shoulder, ribs and back for 3-4 days - he states the only thing he may have done is possibly lift something. Naproxen helps a little  Reason for Disposition . [1] MODERATE back pain (e.g., interferes with normal activities) AND [2] present > 3 days  Answer Assessment - Initial Assessment Questions 1. ONSET: "When did the pain begin?"      3-4 days 2. LOCATION: "Where does it hurt?" (upper, mid or lower back)     R back, rib and shoulder 3. SEVERITY: "How bad is the pain?"  (e.g., Scale 1-10; mild, moderate, or severe)   - MILD (1-3): doesn't interfere with normal activities    - MODERATE (4-7): interferes with normal activities or awakens from sleep    - SEVERE (8-10): excruciating pain, unable to do any normal activities      moderate 4. PATTERN: "Is the pain constant?" (e.g., yes, no; constant, intermittent)      Comes and goes 5. RADIATION: "Does the pain shoot into your legs or elsewhere?"     no 6. CAUSE:  "What do you think is causing the back pain?"      Not sure 7. BACK OVERUSE:  "Any recent lifting of heavy objects, strenuous work or exercise?"     lifting recently 8. MEDICATIONS: "What have you taken so far for the pain?" (e.g., nothing, acetaminophen, NSAIDS)     naproxin 9. NEUROLOGIC SYMPTOMS: "Do you have any weakness, numbness, or problems with bowel/bladder control?"     no 10. OTHER SYMPTOMS: "Do you have any other symptoms?" (e.g., fever, abdominal pain, burning with urination, blood in urine)       no 11. PREGNANCY: "Is there any chance you are pregnant?" (e.g., yes, no; LMP)       n/a  Protocols used: BACK PAIN-A-AH

## 2020-03-19 ENCOUNTER — Encounter: Payer: Self-pay | Admitting: Family Medicine

## 2020-03-19 ENCOUNTER — Ambulatory Visit (INDEPENDENT_AMBULATORY_CARE_PROVIDER_SITE_OTHER): Payer: Medicare Other | Admitting: Family Medicine

## 2020-03-19 ENCOUNTER — Other Ambulatory Visit: Payer: Self-pay

## 2020-03-19 VITALS — BP 136/72 | HR 80 | Temp 98.0°F | Resp 16 | Ht 68.0 in | Wt 187.4 lb

## 2020-03-19 DIAGNOSIS — M542 Cervicalgia: Secondary | ICD-10-CM

## 2020-03-19 MED ORDER — TRAMADOL HCL 50 MG PO TABS: 50.0000 mg | ORAL_TABLET | Freq: Three times a day (TID) | ORAL | 0 refills | Status: AC | PRN

## 2020-03-19 NOTE — Patient Instructions (Signed)
biofreeze over the counter to massage your neck and upper back Take Naproxen 220 mg two tabs twice a day with food for a max of 5 more days Take Tizanidine 2 mg up to three times a day for spasms

## 2020-03-19 NOTE — Progress Notes (Signed)
Name: William Parks   MRN: [id number]    DOB: 1946/03/28   Date:03/19/2020       Progress Note  Subjective  Chief Complaint  Acute visit for right shoulder pain as well as back pain and rib pain  HPI   Back spasms: he states five days ago he decorated his house for Christmas, picking up heavy boxes and crates by himself all day, and following day he woke up with pain on right side of neck that radiates to shoulder and right anterior chest, pain is intermittent . Present with movement better if stays still. Pain when abducting right shoulder. He has been taking some Naproxen and seems to be helping with symptoms. Pain when present is 8/10. He denies bowel or bladder incontinence. He has been sleeping on his recliner to avoid rolling in his bed. No numbness or weakness .  He has Tizanidine at home but was afraid to take it with Naproxen   Patient Active Problem List   Diagnosis Date Noted  . Atherosclerosis of aorta ([place]) 10/03/2019  . Neck and shoulder pain 11/08/2016  . Tobacco abuse counseling 10/25/2016  . Vitamin D deficiency 10/15/2015  . Dyslipidemia 06/02/2015  . Hypertension 04/01/2015  . Uncontrolled type 2 diabetes mellitus with peripheral neuropathy ([place]) 04/01/2015    Past Surgical History:  Procedure Laterality Date  . CYST REMOVAL TRUNK     2x  . SHOULDER SURGERY Right 2009   rotator cuff surgery    Family History  Problem Relation Age of Onset  . Cancer Mother        Breast CA  . Diabetes Mother   . Hypertension Mother   . Diabetes Brother     Social History   Tobacco Use  . Smoking status: Light Tobacco Smoker    Packs/day: 0.25    Years: 50.00    Pack years: 12.50    Types: Cigarettes    Start date: 07/27/1967  . Smokeless tobacco: Never Used  . Tobacco comment: 2-3 cigarettes per day, smoking cessation program information provided  Substance Use Topics  . Alcohol use: No    Alcohol/week: 0.0 standard drinks     Current Outpatient Medications:   .  aspirin 81 MG EC tablet, Take 81 mg by mouth daily., Disp: , Rfl: 5 .  atorvastatin (LIPITOR) 10 MG tablet, Take 1 tablet (10 mg total) by mouth daily at 6 PM., Disp: 90 tablet, Rfl: 1 .  blood glucose meter kit and supplies, Dispense based on patient and insurance preference. Use up to four times daily as directed. (FOR ICD-10 E10.9, E11.9)., Disp: 1 each, Rfl: 0 .  Cholecalciferol (VITAMIN D3) 2000 units TABS, Take by mouth., Disp: , Rfl:  .  docusate sodium (COLACE) 100 MG capsule, Take 100 mg by mouth daily., Disp: , Rfl:  .  Insulin Lispro Prot & Lispro (HUMALOG MIX 75/25 KWIKPEN) (75-25) 100 UNIT/ML Kwikpen, INJECT INTO THE SKIN 2 (TWO) TIMES DAILY WITH A MEAL. 30 UNITS IN AM AND 25 UNITS BEFORE DINNER, Disp: 15 mL, Rfl: 2 .  Insulin Pen Needle 32G X 6 MM MISC, SMARTSIG:SUB-Q As Directed, Disp: , Rfl:  .  Lancets (ONETOUCH DELICA PLUS [id number]) MISC, TEST UP TO 4 TIMES A DAY, Disp: , Rfl:  .  losartan (COZAAR) 100 MG tablet, Take 1 tablet (100 mg total) by mouth daily., Disp: 90 tablet, Rfl: 1 .  metoprolol tartrate (LOPRESSOR) 25 MG tablet, Take 1 tablet (25 mg total) by mouth 2 (two) times daily.,  Disp: 180 tablet, Rfl: 1 .  NOVOFINE 32G X 6 MM MISC, USE AS DIRECTED, Disp: 100 each, Rfl: 5 .  ONETOUCH ULTRA test strip, TEST UP TO 4 TIMES A DAY, Disp: 100 strip, Rfl: 0 .  pregabalin (LYRICA) 150 MG capsule, Take 1 capsule (150 mg total) by mouth at bedtime., Disp: 90 capsule, Rfl: 1 .  tiZANidine (ZANAFLEX) 2 MG tablet, TAKE 1 TABLET (2 MG TOTAL) BY MOUTH EVERY EVENING., Disp: 90 tablet, Rfl: 0  No Known Allergies  I personally reviewed active problem list, medication list, allergies, family history, social history, health maintenance with the patient/caregiver today.   ROS  Ten systems reviewed and is negative except as mentioned in HPI   Objective  Vitals:   03/19/20 1116  BP: 136/72  Pulse: 80  Resp: 16  Temp: 98 F (36.7 C)  TempSrc: Oral  SpO2: 100%  Weight: 187  lb 6.4 oz (85 kg)  Height: 5' 8"  (1.727 m)    Body mass index is 28.49 kg/m.  Physical Exam  Constitutional: Patient appears well-developed and well-nourished.  No distress.  HEENT: head atraumatic, normocephalic, pupils equal and reactive to light Cardiovascular: Normal rate, regular rhythm and normal heart sounds.  No murmur heard. No BLE edema. Pulmonary/Chest: Effort normal and breath sounds normal. No respiratory distress. Abdominal: Soft.  There is no tenderness. Muscular Skeletal: pain during palpation of right lateral neck, also has pain with rom of neck, spasms of trapezium muscle, normal sensation and grip.  Psychiatric: Patient has a normal mood and affect. behavior is normal. Judgment and thought content normal.  Recent Results (from the past 2160 hour(s))  POCT HgB A1C     Status: Abnormal   Collection Time: 03/05/20 10:36 AM  Result Value Ref Range   Hemoglobin A1C 9.2 (A) 4.0 - 5.6 %   HbA1c POC (<> result, manual entry)     HbA1c, POC (prediabetic range)     HbA1c, POC (controlled diabetic range)       PHQ2/9: Depression screen [facility] 2/9 03/19/2020 03/05/2020 12/03/2019 10/03/2019 08/20/2019  Decreased Interest 1 0 0 0 0  Down, Depressed, Hopeless 1 0 0 0 0  PHQ - 2 Score 2 0 0 0 0  Altered sleeping 2 - 0 0 0  Tired, decreased energy 2 - 0 0 0  Change in appetite 2 - 0 0 0  Feeling bad or failure about yourself  0 - 0 0 0  Trouble concentrating 0 - 0 0 0  Moving slowly or fidgety/restless 0 - 0 0 0  Suicidal thoughts 0 - 0 0 0  PHQ-9 Score 8 - 0 0 0  Difficult doing work/chores Not difficult at all - - - -  Some recent data might be hidden    phq 9 is positive due to acute pain    Fall Risk: Fall Risk  03/19/2020 03/05/2020 12/03/2019 10/03/2019 08/20/2019  Falls in the past year? 0 0 0 0 0  Number falls in past yr: 0 0 0 0 0  Injury with Fall? 0 0 0 0 0  Risk for fall due to : - - - - -  Follow up - - - - -     Assessment & Plan  1. Acute neck  pain  He will resume Tizanidine, continue Naproxen and try biofreeze massage on his neck   - traMADol (ULTRAM) 50 MG tablet; Take 1 tablet (50 mg total) by mouth every 8 (eight) hours as needed for up  to 5 days.  Dispense: 15 tablet; Refill: 0

## 2020-05-24 ENCOUNTER — Other Ambulatory Visit: Payer: Self-pay | Admitting: Family Medicine

## 2020-05-24 DIAGNOSIS — M545 Low back pain, unspecified: Secondary | ICD-10-CM

## 2020-05-24 NOTE — Telephone Encounter (Signed)
Requested medication (s) are due for refill today: yes  Requested medication (s) are on the active medication list: yes  Last refill:  02/24/20  Future visit scheduled: yes  Notes to clinic:  med not delegated to NT to RF   Requested Prescriptions  Pending Prescriptions Disp Refills   tiZANidine (ZANAFLEX) 2 MG tablet [Pharmacy Med Name: TIZANIDINE HCL 2 MG TABLET] 90 tablet 0    Sig: TAKE 1 TABLET (2 MG TOTAL) BY MOUTH EVERY EVENING.      Not Delegated - Cardiovascular:  Alpha-2 Agonists - tizanidine Failed - 05/24/2020 12:48 AM      Failed - This refill cannot be delegated      Passed - Valid encounter within last 6 months    Recent Outpatient Visits           2 months ago Acute neck pain   [facility] [place], Danna Hefty, MD   2 months ago Uncontrolled type 2 diabetes mellitus with hyperglycemia [facility])   [facility] [place], Danna Hefty, MD   5 months ago Uncontrolled type 2 diabetes mellitus with hyperglycemia [facility])   [facility] [place], Danna Hefty, MD   7 months ago Uncontrolled type 2 diabetes mellitus with hyperglycemia [facility])   [facility] Alba Cory, MD   9 months ago Uncontrolled type 2 diabetes mellitus with hyperglycemia [facility])   [facility] Alba Cory, MD       Future Appointments             In 5 days  [facility]   In 1 month Alba Cory, MD [facility], [facility]

## 2020-05-26 ENCOUNTER — Telehealth: Payer: Self-pay | Admitting: Family Medicine

## 2020-05-26 ENCOUNTER — Other Ambulatory Visit: Payer: Self-pay

## 2020-05-26 NOTE — Telephone Encounter (Signed)
Copied from CRM [id number]. Topic: Medicare AWV >> May 26, 2020  4:08 PM Claudette Laws R wrote: Reason for CRM:  No answer unable to leave a message to notify patient AWVS on Feb 3rd will be completed by phone not in office -srs

## 2020-05-29 ENCOUNTER — Ambulatory Visit: Payer: Medicare Other

## 2020-05-30 ENCOUNTER — Other Ambulatory Visit: Payer: Self-pay | Admitting: Family Medicine

## 2020-06-06 ENCOUNTER — Other Ambulatory Visit: Payer: Self-pay

## 2020-06-06 ENCOUNTER — Other Ambulatory Visit: Payer: Self-pay | Admitting: Family Medicine

## 2020-06-06 DIAGNOSIS — E1140 Type 2 diabetes mellitus with diabetic neuropathy, unspecified: Secondary | ICD-10-CM

## 2020-06-06 DIAGNOSIS — E1165 Type 2 diabetes mellitus with hyperglycemia: Secondary | ICD-10-CM

## 2020-06-06 DIAGNOSIS — IMO0002 Reserved for concepts with insufficient information to code with codable children: Secondary | ICD-10-CM

## 2020-06-06 MED ORDER — PREGABALIN 150 MG PO CAPS: 150.0000 mg | ORAL_CAPSULE | Freq: Every day | ORAL | 0 refills | Status: DC

## 2020-06-06 NOTE — Telephone Encounter (Signed)
Requested medication (s) are due for refill today: yes  Requested medication (s) are on the active medication list: yes  Last refill:  12/03/19 #90 with 1 refill  Future visit scheduled: yes  Notes to clinic:  Please review for refill. Refill not delegated per protocol    Requested Prescriptions  Pending Prescriptions Disp Refills   pregabalin (LYRICA) 150 MG capsule [Pharmacy Med Name: PREGABALIN 150 MG CAPSULE] 90 capsule     Sig: Take 1 capsule (150 mg total) by mouth at bedtime.      Not Delegated - Neurology:  Anticonvulsants - Controlled Failed - 06/06/2020  3:52 PM      Failed - This refill cannot be delegated      Passed - Valid encounter within last 12 months    Recent Outpatient Visits           2 months ago Acute neck pain   [facility] [place], Danna Hefty, MD   3 months ago Uncontrolled type 2 diabetes mellitus with hyperglycemia [facility])   [facility] [place], Danna Hefty, MD   6 months ago Uncontrolled type 2 diabetes mellitus with hyperglycemia [facility])   [facility] [place], Danna Hefty, MD   8 months ago Uncontrolled type 2 diabetes mellitus with hyperglycemia [facility])   [facility] Alba Cory, MD   9 months ago Uncontrolled type 2 diabetes mellitus with hyperglycemia [facility])   [facility] Alba Cory, MD       Future Appointments             In 4 weeks Alba Cory, MD [facility], [facility]

## 2020-06-12 ENCOUNTER — Ambulatory Visit: Payer: Medicare Other

## 2020-06-17 MED ORDER — TIZANIDINE HCL 2 MG PO TABS: ORAL_TABLET | ORAL | 0 refills | Status: DC

## 2020-06-17 NOTE — Addendum Note (Signed)
Addended by: Davene Costain on: 06/17/2020 11:48 AM   Modules accepted: Orders

## 2020-06-30 LAB — HEMOGLOBIN A1C: Hemoglobin A1C: 10.3

## 2020-06-30 LAB — HM DIABETES FOOT EXAM: HM Diabetic Foot Exam: NORMAL

## 2020-07-02 NOTE — Progress Notes (Deleted)
Name: William Parks   MRN: [id number]    DOB: Nov 17, 1945   Date:07/02/2020       Progress Note  Subjective  Chief Complaint  Follow up   HPI DMII: he was seen back in April and A1C was 12.4 %,  In August 2021 was 11.1 % and today s down to 9.2 % . He is now on dose of insulin  75/25 he is still only using 25 in am and 25 in pm, he was also given [place] but he states he is afraid of losing weight, explained it will help get glucose at goal. He was also  advised to go up to 30-25 and monitor. He checks glucose most mornings and glucose has been 89-286 , most mornings is low 100's  He denies polyphagia, polydipsia or polyuria. He continues to avoid sweets, he does not like sandwiches.  HTN:he is taking losartan 100 and metoprolol 25 BID ,he stopped taking hctz - his daughter told him to stop, since bp is at goal and he will start [place] we will keep him off hctz  No chest pain, palpitation of dizzinessContinue current medications   Dyslipidemia: heis taking Atorvastatin, last LDL went up from 66 to 116 but last visit it was down to 69 again, continue medications   Tobacco use: discussed importance of quitting, he states he does not have daily cough or SOB. We also discussed lung cancer screen, not interested   Intermittent low back pain: usually on left lower back sometimes radiculitis, no weakness, no bowel or bladder incontinenceHe states doing better since he has been wearing a back brace while working in his yard, or doing any physical work at home   Malnutrition: he states he used to be in the 210 lbs range, but it has been stable over the past few visits . Discussed colon cancer, lung cancer screen , also explained diabetes being out of control. He keeps bringing up stress also. Advised glucerna shakes. He lives with girlfriend - her name is [name] number [phone]. Today he seems more engaged.   Atherosclerosis of aorta: on statin and aspirin daily   *** Patient Active Problem  List   Diagnosis Date Noted  . Atherosclerosis of aorta ([place]) 10/03/2019  . Neck and shoulder pain 11/08/2016  . Tobacco abuse counseling 10/25/2016  . Vitamin D deficiency 10/15/2015  . Dyslipidemia 06/02/2015  . Hypertension 04/01/2015  . Uncontrolled type 2 diabetes mellitus with peripheral neuropathy ([place]) 04/01/2015    Past Surgical History:  Procedure Laterality Date  . CYST REMOVAL TRUNK     2x  . SHOULDER SURGERY Right 2009   rotator cuff surgery    Family History  Problem Relation Age of Onset  . Cancer Mother        Breast CA  . Diabetes Mother   . Hypertension Mother   . Diabetes Brother     Social History   Tobacco Use  . Smoking status: Light Tobacco Smoker    Packs/day: 0.25    Years: 50.00    Pack years: 12.50    Types: Cigarettes    Start date: 07/27/1967  . Smokeless tobacco: Never Used  . Tobacco comment: 2-3 cigarettes per day, smoking cessation program information provided  Substance Use Topics  . Alcohol use: No    Alcohol/week: 0.0 standard drinks     Current Outpatient Medications:  .  aspirin 81 MG EC tablet, Take 81 mg by mouth daily., Disp: , Rfl: 5 .  atorvastatin (LIPITOR) 10  MG tablet, Take 1 tablet (10 mg total) by mouth daily at 6 PM., Disp: 90 tablet, Rfl: 1 .  blood glucose meter kit and supplies, Dispense based on patient and insurance preference. Use up to four times daily as directed. (FOR ICD-10 E10.9, E11.9)., Disp: 1 each, Rfl: 0 .  Cholecalciferol (VITAMIN D3) 2000 units TABS, Take by mouth., Disp: , Rfl:  .  docusate sodium (COLACE) 100 MG capsule, Take 100 mg by mouth daily., Disp: , Rfl:  .  Insulin Lispro Prot & Lispro (HUMALOG MIX 75/25 KWIKPEN) (75-25) 100 UNIT/ML Kwikpen, INJECT INTO THE SKIN 2 (TWO) TIMES DAILY WITH A MEAL. 30 UNITS IN AM AND 25 UNITS BEFORE DINNER, Disp: 15 mL, Rfl: 2 .  Insulin Pen Needle 32G X 6 MM MISC, SMARTSIG:SUB-Q As Directed, Disp: , Rfl:  .  Lancets (ONETOUCH DELICA PLUS [id number]) MISC, TEST  UP TO 4 TIMES A DAY, Disp: , Rfl:  .  losartan (COZAAR) 100 MG tablet, Take 1 tablet (100 mg total) by mouth daily., Disp: 90 tablet, Rfl: 1 .  metoprolol tartrate (LOPRESSOR) 25 MG tablet, Take 1 tablet (25 mg total) by mouth 2 (two) times daily., Disp: 180 tablet, Rfl: 1 .  NOVOFINE 32G X 6 MM MISC, USE AS DIRECTED, Disp: 100 each, Rfl: 5 .  ONETOUCH ULTRA test strip, TEST UP TO 4 TIMES A DAY, Disp: 100 strip, Rfl: 0 .  pregabalin (LYRICA) 150 MG capsule, Take 1 capsule (150 mg total) by mouth at bedtime., Disp: 90 capsule, Rfl: 0 .  tiZANidine (ZANAFLEX) 2 MG tablet, TAKE 1 TABLET (2 MG TOTAL) BY MOUTH EVERY EVENING., Disp: 90 tablet, Rfl: 0  No Known Allergies  I personally reviewed {Reviewed:[id number]} with the patient/caregiver today.   ROS  ***  Objective  There were no vitals filed for this visit.  There is no height or weight on file to calculate BMI.  Physical Exam ***  No results found for this or any previous visit (from the past 2160 hour(s)).  Diabetic Foot Exam: Diabetic Foot Exam - Simple   No data filed    ***  PHQ2/9: Depression screen [facility] 2/9 03/19/2020 03/05/2020 12/03/2019 10/03/2019 08/20/2019  Decreased Interest 1 0 0 0 0  Down, Depressed, Hopeless 1 0 0 0 0  PHQ - 2 Score 2 0 0 0 0  Altered sleeping 2 - 0 0 0  Tired, decreased energy 2 - 0 0 0  Change in appetite 2 - 0 0 0  Feeling bad or failure about yourself  0 - 0 0 0  Trouble concentrating 0 - 0 0 0  Moving slowly or fidgety/restless 0 - 0 0 0  Suicidal thoughts 0 - 0 0 0  PHQ-9 Score 8 - 0 0 0  Difficult doing work/chores Not difficult at all - - - -  Some recent data might be hidden    phq 9 is {gen pos [id number]} ***  Fall Risk: Fall Risk  03/19/2020 03/05/2020 12/03/2019 10/03/2019 08/20/2019  Falls in the past year? 0 0 0 0 0  Number falls in past yr: 0 0 0 0 0  Injury with Fall? 0 0 0 0 0  Risk for fall due to : - - - - -  Follow up - - - - -   ***   Functional Status Survey:    ***   Assessment & Plan  *** There are no diagnoses linked to this encounter.

## 2020-07-04 ENCOUNTER — Ambulatory Visit: Payer: Medicare Other | Admitting: Family Medicine

## 2020-07-04 DIAGNOSIS — Z1211 Encounter for screening for malignant neoplasm of colon: Secondary | ICD-10-CM

## 2020-07-04 DIAGNOSIS — M542 Cervicalgia: Secondary | ICD-10-CM

## 2020-07-04 DIAGNOSIS — E1165 Type 2 diabetes mellitus with hyperglycemia: Secondary | ICD-10-CM

## 2020-07-14 NOTE — Progress Notes (Signed)
Name: William Parks   MRN: [id number]    DOB: 1945-12-22   Date:07/16/2020       Progress Note  Subjective  Chief Complaint  Follow Up  HPI  DMII: he was seen back in April and A1C was 12.4 %,  In August 2021 was 11.1 % and today s down to 9.2 % . He is now on dose of insulin  75/25 he is still only using 25 in am and 25 in pm, Medicare nurse went to his house March 7 th and A1C was 10.3%, he states compliant with medications, but not checking glucose lately   He denies polyphagia, polydipsia or polyuria. He continues to avoid sweets, he does not like sandwiches, he has diabetic neuropathy but it has improved with Lyrica 150 mg every evening, pain was 8/10 and is down to 3/10 but he would like to try going up to BID  HTN:he is taking losartan 100 and metoprolol 25 BID , bp is at goal, no chest pain, palpitation or dizziness.   Dyslipidemia: heis taking Atorvastatin, last LDL was at goal at 69, continue medications   Tobacco use: discussed importance of quitting, he states he does not have daily cough or SOB. We also discussed lung cancer screen, still not interested at this time   Intermittent low back pain: usually on left lower back sometimes radiculitis, no weakness, no bowel or bladder incontinence He is doing better, wears back brace when outside doing yard work. , only taking Tizanidine prn when he has a flare   Malnutrition: he states he used to be in the 210 lbs range, went down to 187 lbs but is gradually gaining weight again, he states eating more vegetables, he states his appetite is under better control .   Atherosclerosis of aorta: on statin and aspirin daily , last LDL was at goal   Patient Active Problem List   Diagnosis Date Noted  . Atherosclerosis of aorta ([place]) 10/03/2019  . Neck and shoulder pain 11/08/2016  . Tobacco abuse counseling 10/25/2016  . Vitamin D deficiency 10/15/2015  . Dyslipidemia 06/02/2015  . Hypertension 04/01/2015  . Uncontrolled type 2  diabetes mellitus with peripheral neuropathy ([place]) 04/01/2015    Past Surgical History:  Procedure Laterality Date  . CYST REMOVAL TRUNK     2x  . SHOULDER SURGERY Right 2009   rotator cuff surgery    Family History  Problem Relation Age of Onset  . Cancer Mother        Breast CA  . Diabetes Mother   . Hypertension Mother   . Diabetes Brother     Social History   Tobacco Use  . Smoking status: Light Tobacco Smoker    Packs/day: 0.25    Years: 50.00    Pack years: 12.50    Types: Cigarettes    Start date: 07/27/1967  . Smokeless tobacco: Never Used  . Tobacco comment: 2-3 cigarettes per day, smoking cessation program information provided  Substance Use Topics  . Alcohol use: No    Alcohol/week: 0.0 standard drinks     Current Outpatient Medications:  .  aspirin 81 MG EC tablet, Take 81 mg by mouth daily., Disp: , Rfl: 5 .  blood glucose meter kit and supplies, Dispense based on patient and insurance preference. Use up to four times daily as directed. (FOR ICD-10 E10.9, E11.9)., Disp: 1 each, Rfl: 0 .  Cholecalciferol (VITAMIN D3) 2000 units TABS, Take by mouth., Disp: , Rfl:  .  docusate sodium (  COLACE) 100 MG capsule, Take 100 mg by mouth daily., Disp: , Rfl:  .  Insulin Lispro Prot & Lispro (HUMALOG MIX 75/25 KWIKPEN) (75-25) 100 UNIT/ML Kwikpen, INJECT INTO THE SKIN 2 (TWO) TIMES DAILY WITH A MEAL. 30 UNITS IN AM AND 25 UNITS BEFORE DINNER, Disp: 15 mL, Rfl: 2 .  Insulin Pen Needle 32G X 6 MM MISC, SMARTSIG:SUB-Q As Directed, Disp: , Rfl:  .  Lancets (ONETOUCH DELICA PLUS [id number]) MISC, TEST UP TO 4 TIMES A DAY, Disp: , Rfl:  .  NOVOFINE 32G X 6 MM MISC, USE AS DIRECTED, Disp: 100 each, Rfl: 5 .  ONETOUCH ULTRA test strip, TEST UP TO 4 TIMES A DAY, Disp: 100 strip, Rfl: 0 .  tiZANidine (ZANAFLEX) 2 MG tablet, TAKE 1 TABLET (2 MG TOTAL) BY MOUTH EVERY EVENING., Disp: 90 tablet, Rfl: 0 .  atorvastatin (LIPITOR) 10 MG tablet, Take 1 tablet (10 mg total) by mouth daily  at 6 PM., Disp: 90 tablet, Rfl: 1 .  losartan (COZAAR) 100 MG tablet, Take 1 tablet (100 mg total) by mouth daily., Disp: 90 tablet, Rfl: 1 .  metoprolol tartrate (LOPRESSOR) 25 MG tablet, Take 1 tablet (25 mg total) by mouth 2 (two) times daily., Disp: 180 tablet, Rfl: 1 .  pregabalin (LYRICA) 150 MG capsule, Take 1 capsule (150 mg total) by mouth 2 (two) times daily., Disp: 180 capsule, Rfl: 1  No Known Allergies  I personally reviewed active problem list, medication list, allergies, family history, social history, health maintenance with the patient/caregiver today.   ROS  Constitutional: Negative for fever or weight change.  Respiratory: Negative for cough and shortness of breath.   Cardiovascular: Negative for chest pain or palpitations.  Gastrointestinal: Negative for abdominal pain, no bowel changes.  Musculoskeletal: Negative for gait problem or joint swelling.  Skin: Negative for rash.  Neurological: Negative for dizziness or headache.  No other specific complaints in a complete review of systems (except as listed in HPI above).  Objective  Vitals:   07/16/20 1105  BP: 138/76  Pulse: 75  Resp: 16  Temp: 98 F (36.7 C)  TempSrc: Oral  SpO2: 95%  Weight: 193 lb (87.5 kg)  Height: _0  (1.727 m)    Body mass index is 29.35 kg/m.  Physical Exam  Constitutional: Patient appears well-developed and well-nourished.  No distress.  HEENT: head atraumatic, normocephalic, pupils equal and reactive to light,  neck supple Cardiovascular: Normal rate, regular rhythm and normal heart sounds.  No murmur heard. No BLE edema. Pulmonary/Chest: Effort normal and breath sounds normal. No respiratory distress. Abdominal: Soft.  There is no tenderness. Psychiatric: Patient has a normal mood and affect. behavior is normal. Judgment and thought content normal.  Recent Results (from the past 2160 hour(s))  HM DIABETES FOOT EXAM     Status: None   Collection Time: 06/30/20 12:00 AM   Result Value Ref Range   HM Diabetic Foot Exam Normal   Hemoglobin A1c     Status: None   Collection Time: 06/30/20 12:00 AM  Result Value Ref Range   Hemoglobin A1C 10.3     Comment: POC      PHQ2/9: Depression screen [facility] 2/9 07/16/2020 03/19/2020 03/05/2020 12/03/2019 10/03/2019  Decreased Interest 0 1 0 0 0  Down, Depressed, Hopeless 0 1 0 0 0  PHQ - 2 Score 0 2 0 0 0  Altered sleeping - 2 - 0 0  Tired, decreased energy - 2 - 0 0  Change  in appetite - 2 - 0 0  Feeling bad or failure about yourself  - 0 - 0 0  Trouble concentrating - 0 - 0 0  Moving slowly or fidgety/restless - 0 - 0 0  Suicidal thoughts - 0 - 0 0  PHQ-9 Score - 8 - 0 0  Difficult doing work/chores - Not difficult at all - - -  Some recent data might be hidden    phq 9 is negative   Fall Risk: Fall Risk  07/16/2020 03/19/2020 03/05/2020 12/03/2019 10/03/2019  Falls in the past year? 0 0 0 0 0  Number falls in past yr: 0 0 0 0 0  Injury with Fall? 0 0 0 0 0  Risk for fall due to : - - - - -  Follow up - - - - -     Functional Status Survey: Is the patient deaf or have difficulty hearing?: Yes Does the patient have difficulty seeing, even when wearing glasses/contacts?: Yes Does the patient have difficulty concentrating, remembering, or making decisions?: Yes Does the patient have difficulty walking or climbing stairs?: No Does the patient have difficulty dressing or bathing?: No Does the patient have difficulty doing errands alone such as visiting a doctor's office or shopping?: No    Assessment & Plan  1. Uncontrolled type 2 diabetes mellitus with hyperglycemia ([place])   2. Dyslipidemia  - atorvastatin (LIPITOR) 10 MG tablet; Take 1 tablet (10 mg total) by mouth daily at 6 PM.  Dispense: 90 tablet; Refill: 1  3. Essential hypertension  - losartan (COZAAR) 100 MG tablet; Take 1 tablet (100 mg total) by mouth daily.  Dispense: 90 tablet; Refill: 1 - metoprolol tartrate (LOPRESSOR) 25 MG tablet;  Take 1 tablet (25 mg total) by mouth 2 (two) times daily.  Dispense: 180 tablet; Refill: 1  4. Uncontrolled type 2 diabetes with neuropathy ([facility])  - pregabalin (LYRICA) 150 MG capsule; Take 1 capsule (150 mg total) by mouth 2 (two) times daily.  Dispense: 180 capsule; Refill: 1 - POCT UA - Microalbumin - Ambulatory referral to Endocrinology  5. Atherosclerosis of aorta ([place])   6. Hyperlipidemia associated with type 2 diabetes mellitus ([place])  Continue statin therapy   7. Hypertension associated with type 2 diabetes mellitus ([place])  At goal today   8. Vitamin D deficiency   9. Intermittent low back pain

## 2020-07-16 ENCOUNTER — Ambulatory Visit (INDEPENDENT_AMBULATORY_CARE_PROVIDER_SITE_OTHER): Payer: Medicare Other | Admitting: Family Medicine

## 2020-07-16 ENCOUNTER — Other Ambulatory Visit: Payer: Self-pay

## 2020-07-16 ENCOUNTER — Encounter: Payer: Self-pay | Admitting: Family Medicine

## 2020-07-16 VITALS — BP 138/76 | HR 75 | Temp 98.0°F | Resp 16 | Ht 68.0 in | Wt 193.0 lb

## 2020-07-16 DIAGNOSIS — E1140 Type 2 diabetes mellitus with diabetic neuropathy, unspecified: Secondary | ICD-10-CM | POA: Diagnosis not present

## 2020-07-16 DIAGNOSIS — I700 Atherosclerosis of aorta: Secondary | ICD-10-CM

## 2020-07-16 DIAGNOSIS — M545 Low back pain, unspecified: Secondary | ICD-10-CM | POA: Diagnosis not present

## 2020-07-16 DIAGNOSIS — E1165 Type 2 diabetes mellitus with hyperglycemia: Secondary | ICD-10-CM | POA: Diagnosis not present

## 2020-07-16 DIAGNOSIS — E785 Hyperlipidemia, unspecified: Secondary | ICD-10-CM | POA: Diagnosis not present

## 2020-07-16 DIAGNOSIS — I152 Hypertension secondary to endocrine disorders: Secondary | ICD-10-CM

## 2020-07-16 DIAGNOSIS — E1169 Type 2 diabetes mellitus with other specified complication: Secondary | ICD-10-CM | POA: Diagnosis not present

## 2020-07-16 DIAGNOSIS — Z1211 Encounter for screening for malignant neoplasm of colon: Secondary | ICD-10-CM

## 2020-07-16 DIAGNOSIS — E559 Vitamin D deficiency, unspecified: Secondary | ICD-10-CM | POA: Diagnosis not present

## 2020-07-16 DIAGNOSIS — I10 Essential (primary) hypertension: Secondary | ICD-10-CM

## 2020-07-16 DIAGNOSIS — IMO0002 Reserved for concepts with insufficient information to code with codable children: Secondary | ICD-10-CM

## 2020-07-16 DIAGNOSIS — E1159 Type 2 diabetes mellitus with other circulatory complications: Secondary | ICD-10-CM

## 2020-07-16 LAB — POCT UA - MICROALBUMIN: Microalbumin Ur, POC: 20 mg/L

## 2020-07-16 MED ORDER — ATORVASTATIN CALCIUM 10 MG PO TABS
10.0000 mg | ORAL_TABLET | Freq: Every day | ORAL | 1 refills | Status: DC
Start: 2020-07-16 — End: 2021-02-18

## 2020-07-16 MED ORDER — METOPROLOL TARTRATE 25 MG PO TABS
25.0000 mg | ORAL_TABLET | Freq: Two times a day (BID) | ORAL | 1 refills | Status: DC
Start: 2020-07-16 — End: 2021-02-18

## 2020-07-16 MED ORDER — LOSARTAN POTASSIUM 100 MG PO TABS: 100.0000 mg | ORAL_TABLET | Freq: Every day | ORAL | 1 refills | Status: DC

## 2020-07-16 MED ORDER — PREGABALIN 150 MG PO CAPS: 150.0000 mg | ORAL_CAPSULE | Freq: Two times a day (BID) | ORAL | 1 refills | Status: DC

## 2020-08-05 ENCOUNTER — Ambulatory Visit (INDEPENDENT_AMBULATORY_CARE_PROVIDER_SITE_OTHER): Payer: Medicare Other

## 2020-08-05 ENCOUNTER — Other Ambulatory Visit: Payer: Self-pay

## 2020-08-05 VITALS — BP 130/80 | HR 73 | Temp 97.5°F | Resp 16 | Ht 68.0 in | Wt 190.4 lb

## 2020-08-05 DIAGNOSIS — Z0000 Encounter for general adult medical examination without abnormal findings: Secondary | ICD-10-CM | POA: Diagnosis not present
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
## 2020-08-05 NOTE — Patient Instructions (Signed)
William Parks , Thank you for taking time to come for your Medicare Wellness Visit. I appreciate your ongoing commitment to your health goals. Please review the following plan we discussed and let me know if I can assist you in the future.   Screening recommendations/referrals: Colonoscopy: please complete FOBT kit at home Recommended yearly ophthalmology/optometry visit for glaucoma screening and checkup Recommended yearly dental visit for hygiene and checkup  Vaccinations: Influenza vaccine: done 03/05/20 Pneumococcal vaccine: done 2019 Tdap vaccine: due Shingles vaccine: done 05/12/18 due for second dose   Covid-19: done 01/09/20 & 01/30/20   Advanced directives: Advance directive discussed with you today. Even though you declined this today please call our office should you change your mind and we can give you the proper paperwork for you to fill out.  Conditions/risks identified: Keep up the great work!  Next appointment: Follow up in one year for your annual wellness visit.   Preventive Care 16 Years and Older, Male Preventive care refers to lifestyle choices and visits with your health care provider that can promote health and wellness. What does preventive care include?  A yearly physical exam. This is also called an annual well check.  Dental exams once or twice a year.  Routine eye exams. Ask your health care provider how often you should have your eyes checked.  Personal lifestyle choices, including:  Daily care of your teeth and gums.  Regular physical activity.  Eating a healthy diet.  Avoiding tobacco and drug use.  Limiting alcohol use.  Practicing safe sex.  Taking low doses of aspirin every day.  Taking vitamin and mineral supplements as recommended by your health care provider. What happens during an annual well check? The services and screenings done by your health care provider during your annual well check will depend on your age, overall health,  lifestyle risk factors, and family history of disease. Counseling  Your health care provider may ask you questions about your:  Alcohol use.  Tobacco use.  Drug use.  Emotional well-being.  Home and relationship well-being.  Sexual activity.  Eating habits.  History of falls.  Memory and ability to understand (cognition).  Work and work Statistician. Screening  You may have the following tests or measurements:  Height, weight, and BMI.  Blood pressure.  Lipid and cholesterol levels. These may be checked every 5 years, or more frequently if you are over 19 years old.  Skin check.  Lung cancer screening. You may have this screening every year starting at age 62 if you have a 30-pack-year history of smoking and currently smoke or have quit within the past 15 years.  Fecal occult blood test (FOBT) of the stool. You may have this test every year starting at age 18.  Flexible sigmoidoscopy or colonoscopy. You may have a sigmoidoscopy every 5 years or a colonoscopy every 10 years starting at age 17.  Prostate cancer screening. Recommendations will vary depending on your family history and other risks.  Hepatitis C blood test.  Hepatitis B blood test.  Sexually transmitted disease (STD) testing.  Diabetes screening. This is done by checking your blood sugar (glucose) after you have not eaten for a while (fasting). You may have this done every 1-3 years.  Abdominal aortic aneurysm (AAA) screening. You may need this if you are a current or former smoker.  Osteoporosis. You may be screened starting at age 49 if you are at high risk. Talk with your health care provider about your test results, treatment  options, and if necessary, the need for more tests. Vaccines  Your health care provider may recommend certain vaccines, such as:  Influenza vaccine. This is recommended every year.  Tetanus, diphtheria, and acellular pertussis (Tdap, Td) vaccine. You may need a Td booster  every 10 years.  Zoster vaccine. You may need this after age 90.  Pneumococcal 13-valent conjugate (PCV13) vaccine. One dose is recommended after age 12.  Pneumococcal polysaccharide (PPSV23) vaccine. One dose is recommended after age 50. Talk to your health care provider about which screenings and vaccines you need and how often you need them. This information is not intended to replace advice given to you by your health care provider. Make sure you discuss any questions you have with your health care provider. Document Released: 05/09/2015 Document Revised: 12/31/2015 Document Reviewed: 02/11/2015 Elsevier Interactive Patient Education  2017 [place] Prevention in the Home Falls can cause injuries. They can happen to people of all ages. There are many things you can do to make your home safe and to help prevent falls. What can I do on the outside of my home?  Regularly fix the edges of walkways and driveways and fix any cracks.  Remove anything that might make you trip as you walk through a door, such as a raised step or threshold.  Trim any bushes or trees on the path to your home.  Use bright outdoor lighting.  Clear any walking paths of anything that might make someone trip, such as rocks or tools.  Regularly check to see if handrails are loose or broken. Make sure that both sides of any steps have handrails.  Any raised decks and porches should have guardrails on the edges.  Have any leaves, snow, or ice cleared regularly.  Use sand or salt on walking paths during winter.  Clean up any spills in your garage right away. This includes oil or grease spills. What can I do in the bathroom?  Use night lights.  Install grab bars by the toilet and in the tub and shower. Do not use towel bars as grab bars.  Use non-skid mats or decals in the tub or shower.  If you need to sit down in the shower, use a plastic, non-slip stool.  Keep the floor dry. Clean up any  water that spills on the floor as soon as it happens.  Remove soap buildup in the tub or shower regularly.  Attach bath mats securely with double-sided non-slip rug tape.  Do not have throw rugs and other things on the floor that can make you trip. What can I do in the bedroom?  Use night lights.  Make sure that you have a light by your bed that is easy to reach.  Do not use any sheets or blankets that are too big for your bed. They should not hang down onto the floor.  Have a firm chair that has side arms. You can use this for support while you get dressed.  Do not have throw rugs and other things on the floor that can make you trip. What can I do in the kitchen?  Clean up any spills right away.  Avoid walking on wet floors.  Keep items that you use a lot in easy-to-reach places.  If you need to reach something above you, use a strong step stool that has a grab bar.  Keep electrical cords out of the way.  Do not use floor polish or wax that makes floors slippery.  If you must use wax, use non-skid floor wax.  Do not have throw rugs and other things on the floor that can make you trip. What can I do with my stairs?  Do not leave any items on the stairs.  Make sure that there are handrails on both sides of the stairs and use them. Fix handrails that are broken or loose. Make sure that handrails are as long as the stairways.  Check any carpeting to make sure that it is firmly attached to the stairs. Fix any carpet that is loose or worn.  Avoid having throw rugs at the top or bottom of the stairs. If you do have throw rugs, attach them to the floor with carpet tape.  Make sure that you have a light switch at the top of the stairs and the bottom of the stairs. If you do not have them, ask someone to add them for you. What else can I do to help prevent falls?  Wear shoes that:  Do not have high heels.  Have rubber bottoms.  Are comfortable and fit you well.  Are closed  at the toe. Do not wear sandals.  If you use a stepladder:  Make sure that it is fully opened. Do not climb a closed stepladder.  Make sure that both sides of the stepladder are locked into place.  Ask someone to hold it for you, if possible.  Clearly mark and make sure that you can see:  Any grab bars or handrails.  First and last steps.  Where the edge of each step is.  Use tools that help you move around (mobility aids) if they are needed. These include:  Canes.  Walkers.  Scooters.  Crutches.  Turn on the lights when you go into a dark area. Replace any light bulbs as soon as they burn out.  Set up your furniture so you have a clear path. Avoid moving your furniture around.  If any of your floors are uneven, fix them.  If there are any pets around you, be aware of where they are.  Review your medicines with your doctor. Some medicines can make you feel dizzy. This can increase your chance of falling. Ask your doctor what other things that you can do to help prevent falls. This information is not intended to replace advice given to you by your health care provider. Make sure you discuss any questions you have with your health care provider. Document Released: 02/06/2009 Document Revised: 09/18/2015 Document Reviewed: 05/17/2014 Elsevier Interactive Patient Education  2017 [employer].

## 2020-08-26 ENCOUNTER — Inpatient Hospital Stay
Admission: EM | Admit: 2020-08-26 | Discharge: 2020-09-11 | DRG: 330 | Disposition: A | Discharge status: Home Health | Payer: Medicare Other | Attending: Surgery | Admitting: Surgery

## 2020-08-26 ENCOUNTER — Other Ambulatory Visit: Payer: Self-pay

## 2020-08-26 ENCOUNTER — Emergency Department: Payer: Medicare Other

## 2020-08-26 DIAGNOSIS — M19011 Primary osteoarthritis, right shoulder: Secondary | ICD-10-CM | POA: Diagnosis present

## 2020-08-26 DIAGNOSIS — R1900 Intra-abdominal and pelvic swelling, mass and lump, unspecified site: Secondary | ICD-10-CM | POA: Diagnosis not present

## 2020-08-26 DIAGNOSIS — K3533 Acute appendicitis with perforation and localized peritonitis, with abscess: Principal | ICD-10-CM | POA: Diagnosis present

## 2020-08-26 DIAGNOSIS — E1140 Type 2 diabetes mellitus with diabetic neuropathy, unspecified: Secondary | ICD-10-CM | POA: Diagnosis present

## 2020-08-26 DIAGNOSIS — Z794 Long term (current) use of insulin: Secondary | ICD-10-CM | POA: Diagnosis not present

## 2020-08-26 DIAGNOSIS — E876 Hypokalemia: Secondary | ICD-10-CM | POA: Diagnosis not present

## 2020-08-26 DIAGNOSIS — E559 Vitamin D deficiency, unspecified: Secondary | ICD-10-CM | POA: Diagnosis not present

## 2020-08-26 DIAGNOSIS — E119 Type 2 diabetes mellitus without complications: Secondary | ICD-10-CM

## 2020-08-26 DIAGNOSIS — I700 Atherosclerosis of aorta: Secondary | ICD-10-CM | POA: Diagnosis not present

## 2020-08-26 DIAGNOSIS — K567 Ileus, unspecified: Secondary | ICD-10-CM | POA: Diagnosis not present

## 2020-08-26 DIAGNOSIS — I10 Essential (primary) hypertension: Secondary | ICD-10-CM | POA: Diagnosis present

## 2020-08-26 DIAGNOSIS — R109 Unspecified abdominal pain: Secondary | ICD-10-CM | POA: Diagnosis not present

## 2020-08-26 DIAGNOSIS — K3531 Acute appendicitis with localized peritonitis and gangrene, without perforation: Secondary | ICD-10-CM | POA: Diagnosis not present

## 2020-08-26 DIAGNOSIS — IMO0002 Reserved for concepts with insufficient information to code with codable children: Secondary | ICD-10-CM | POA: Diagnosis present

## 2020-08-26 DIAGNOSIS — Z5331 Laparoscopic surgical procedure converted to open procedure: Secondary | ICD-10-CM | POA: Diagnosis not present

## 2020-08-26 DIAGNOSIS — Z803 Family history of malignant neoplasm of breast: Secondary | ICD-10-CM | POA: Diagnosis not present

## 2020-08-26 DIAGNOSIS — Z8249 Family history of ischemic heart disease and other diseases of the circulatory system: Secondary | ICD-10-CM | POA: Diagnosis not present

## 2020-08-26 DIAGNOSIS — R000 Tachycardia, unspecified: Secondary | ICD-10-CM | POA: Diagnosis not present

## 2020-08-26 DIAGNOSIS — Z833 Family history of diabetes mellitus: Secondary | ICD-10-CM

## 2020-08-26 DIAGNOSIS — R32 Unspecified urinary incontinence: Secondary | ICD-10-CM | POA: Diagnosis present

## 2020-08-26 DIAGNOSIS — K660 Peritoneal adhesions (postprocedural) (postinfection): Secondary | ICD-10-CM | POA: Diagnosis present

## 2020-08-26 DIAGNOSIS — E1149 Type 2 diabetes mellitus with other diabetic neurological complication: Secondary | ICD-10-CM

## 2020-08-26 DIAGNOSIS — F17210 Nicotine dependence, cigarettes, uncomplicated: Secondary | ICD-10-CM | POA: Diagnosis not present

## 2020-08-26 DIAGNOSIS — E1165 Type 2 diabetes mellitus with hyperglycemia: Secondary | ICD-10-CM | POA: Diagnosis present

## 2020-08-26 DIAGNOSIS — K3532 Acute appendicitis with perforation and localized peritonitis, without abscess: Secondary | ICD-10-CM | POA: Diagnosis present

## 2020-08-26 DIAGNOSIS — R531 Weakness: Secondary | ICD-10-CM | POA: Diagnosis not present

## 2020-08-26 DIAGNOSIS — E785 Hyperlipidemia, unspecified: Secondary | ICD-10-CM | POA: Diagnosis not present

## 2020-08-26 DIAGNOSIS — Z79899 Other long term (current) drug therapy: Secondary | ICD-10-CM

## 2020-08-26 DIAGNOSIS — R509 Fever, unspecified: Secondary | ICD-10-CM | POA: Diagnosis not present

## 2020-08-26 DIAGNOSIS — K353 Acute appendicitis with localized peritonitis, without perforation or gangrene: Secondary | ICD-10-CM | POA: Diagnosis not present

## 2020-08-26 DIAGNOSIS — E1169 Type 2 diabetes mellitus with other specified complication: Secondary | ICD-10-CM | POA: Diagnosis present

## 2020-08-26 DIAGNOSIS — Z7982 Long term (current) use of aspirin: Secondary | ICD-10-CM | POA: Diagnosis not present

## 2020-08-26 DIAGNOSIS — Z20822 Contact with and (suspected) exposure to covid-19: Secondary | ICD-10-CM | POA: Diagnosis not present

## 2020-08-26 DIAGNOSIS — R1031 Right lower quadrant pain: Secondary | ICD-10-CM | POA: Diagnosis not present

## 2020-08-26 LAB — URINALYSIS, COMPLETE (UACMP) WITH MICROSCOPIC
Bacteria, UA: NONE SEEN
Bilirubin Urine: NEGATIVE
Glucose, UA: >=500 mg/dL — AB
Ketones, ur: 20 mg/dL — AB
Leukocytes,Ua: NEGATIVE
Nitrite: NEGATIVE
Protein, ur: 30 mg/dL — AB
Specific Gravity, Urine: >1.046 — ABNORMAL HIGH (ref 1.005–1.030)
Squamous Epithelial / HPF: NONE SEEN (ref 0–5)
pH: 5 (ref 5.0–8.0)

## 2020-08-26 LAB — CBC
HCT: 43 % (ref 39.0–52.0)
Hemoglobin: 14.3 g/dL (ref 13.0–17.0)
MCH: 28.5 pg (ref 26.0–34.0)
MCHC: 33.3 g/dL (ref 30.0–36.0)
MCV: 85.7 fL (ref 80.0–100.0)
Platelets: 228 10*3/uL (ref 150–400)
RBC: 5.02 MIL/uL (ref 4.22–5.81)
RDW: 13.2 % (ref 11.5–15.5)
WBC: 12.5 10*3/uL — ABNORMAL HIGH (ref 4.0–10.5)
nRBC: 0 % (ref 0.0–0.2)

## 2020-08-26 LAB — COMPREHENSIVE METABOLIC PANEL
ALT: 28 U/L (ref 0–44)
AST: 21 U/L (ref 15–41)
Albumin: 3.3 g/dL — ABNORMAL LOW (ref 3.5–5.0)
Alkaline Phosphatase: 78 U/L (ref 38–126)
Anion gap: 12 (ref 5–15)
BUN: 20 mg/dL (ref 8–23)
CO2: 25 mmol/L (ref 22–32)
Calcium: 9 mg/dL (ref 8.9–10.3)
Chloride: 95 mmol/L — ABNORMAL LOW (ref 98–111)
Creatinine, Ser: 1.14 mg/dL (ref 0.61–1.24)
GFR, Estimated: >60 mL/min (ref >60)
Glucose, Bld: 376 mg/dL — ABNORMAL HIGH (ref 70–99)
Potassium: 4.2 mmol/L (ref 3.5–5.1)
Sodium: 132 mmol/L — ABNORMAL LOW (ref 135–145)
Total Bilirubin: 1.2 mg/dL (ref 0.3–1.2)
Total Protein: 8 g/dL (ref 6.5–8.1)

## 2020-08-26 LAB — RESP PANEL BY RT-PCR (FLU A&B, COVID) ARPGX2
Influenza A by PCR: NEGATIVE
Influenza B by PCR: NEGATIVE
SARS Coronavirus 2 by RT PCR: NEGATIVE

## 2020-08-26 LAB — GLUCOSE, CAPILLARY: Glucose-Capillary: 317 mg/dL — ABNORMAL HIGH (ref 70–99)

## 2020-08-26 LAB — LIPASE, BLOOD: Lipase: 20 U/L (ref 11–51)

## 2020-08-26 LAB — TSH: TSH: 2.356 u[IU]/mL (ref 0.350–4.500)

## 2020-08-26 LAB — LACTIC ACID, PLASMA: Lactic Acid, Venous: 1.9 mmol/L (ref 0.5–1.9)

## 2020-08-26 MED ORDER — INSULIN ASPART 100 UNIT/ML IJ SOLN
0.0000 [IU] | INTRAMUSCULAR | Status: DC
  Administered 2020-08-26: 7 [IU] via SUBCUTANEOUS
  Administered 2020-08-27: 3 [IU] via SUBCUTANEOUS
  Filled 2020-08-26 (×2): qty 1

## 2020-08-26 MED ORDER — LOSARTAN POTASSIUM 50 MG PO TABS
100.0000 mg | ORAL_TABLET | Freq: Every day | ORAL | Status: DC
  Administered 2020-08-26 – 2020-08-27 (×2): 100 mg via ORAL
  Filled 2020-08-26 (×2): qty 2

## 2020-08-26 MED ORDER — PIPERACILLIN-TAZOBACTAM 3.375 G IVPB
3.3750 g | Freq: Three times a day (TID) | INTRAVENOUS | Status: DC
  Administered 2020-08-27 – 2020-09-03 (×23): 3.375 g via INTRAVENOUS
  Filled 2020-08-26 (×22): qty 50

## 2020-08-26 MED ORDER — ATORVASTATIN CALCIUM 10 MG PO TABS
10.0000 mg | ORAL_TABLET | Freq: Every day | ORAL | Status: DC
  Administered 2020-08-26 – 2020-08-31 (×6): 10 mg via ORAL
  Filled 2020-08-26 (×6): qty 1

## 2020-08-26 MED ORDER — SODIUM CHLORIDE 0.9 % IV BOLUS
500.0000 mL | Freq: Once | INTRAVENOUS | Status: AC
  Administered 2020-08-26: 500 mL via INTRAVENOUS

## 2020-08-26 MED ORDER — ACETAMINOPHEN 650 MG RE SUPP: 650.0000 mg | RECTAL | Status: DC | PRN

## 2020-08-26 MED ORDER — PREGABALIN 75 MG PO CAPS
150.0000 mg | ORAL_CAPSULE | Freq: Every day | ORAL | Status: DC
  Administered 2020-08-26 – 2020-09-10 (×14): 150 mg via ORAL
  Filled 2020-08-26 (×14): qty 2

## 2020-08-26 MED ORDER — HYDROMORPHONE HCL 1 MG/ML IJ SOLN
0.5000 mg | INTRAMUSCULAR | Status: DC | PRN
Start: 2020-08-26 — End: 2020-09-11

## 2020-08-26 MED ORDER — NICOTINE 7 MG/24HR TD PT24
7.0000 mg | MEDICATED_PATCH | Freq: Every day | TRANSDERMAL | Status: DC
  Administered 2020-08-29 – 2020-09-11 (×13): 7 mg via TRANSDERMAL
  Filled 2020-08-26 (×19): qty 1

## 2020-08-26 MED ORDER — LACTATED RINGERS IV SOLN: INTRAVENOUS | Status: DC

## 2020-08-26 MED ORDER — ONDANSETRON HCL 4 MG/2ML IJ SOLN
4.0000 mg | Freq: Four times a day (QID) | INTRAMUSCULAR | Status: DC | PRN
  Administered 2020-09-01: 4 mg via INTRAVENOUS
  Filled 2020-08-26: qty 2

## 2020-08-26 MED ORDER — ONDANSETRON HCL 4 MG/2ML IJ SOLN
4.0000 mg | Freq: Once | INTRAMUSCULAR | Status: AC
  Administered 2020-08-26: 4 mg via INTRAVENOUS
  Filled 2020-08-26: qty 2

## 2020-08-26 MED ORDER — TIZANIDINE HCL 2 MG PO TABS
2.0000 mg | ORAL_TABLET | Freq: Every day | ORAL | Status: DC
  Administered 2020-08-26 – 2020-09-10 (×14): 2 mg via ORAL
  Filled 2020-08-26 (×17): qty 1

## 2020-08-26 MED ORDER — PANTOPRAZOLE SODIUM 40 MG IV SOLR
40.0000 mg | Freq: Every day | INTRAVENOUS | Status: DC
  Administered 2020-08-26 – 2020-09-10 (×15): 40 mg via INTRAVENOUS
  Filled 2020-08-26 (×15): qty 40

## 2020-08-26 MED ORDER — ACETAMINOPHEN 325 MG PO TABS
650.0000 mg | ORAL_TABLET | ORAL | Status: DC | PRN
  Administered 2020-08-26: 650 mg via ORAL
  Filled 2020-08-26: qty 2

## 2020-08-26 MED ORDER — IOHEXOL 300 MG/ML  SOLN
100.0000 mL | Freq: Once | INTRAMUSCULAR | Status: AC | PRN
  Administered 2020-08-26: 100 mL via INTRAVENOUS
  Filled 2020-08-26: qty 100

## 2020-08-26 MED ORDER — INSULIN GLARGINE 100 UNIT/ML ~~LOC~~ SOLN
25.0000 [IU] | Freq: Every day | SUBCUTANEOUS | Status: DC
  Administered 2020-08-26: 25 [IU] via SUBCUTANEOUS
  Filled 2020-08-26 (×2): qty 0.25

## 2020-08-26 MED ORDER — PIPERACILLIN-TAZOBACTAM 3.375 G IVPB 30 MIN
3.3750 g | Freq: Once | INTRAVENOUS | Status: AC
  Administered 2020-08-26: 3.375 g via INTRAVENOUS
  Filled 2020-08-26: qty 50

## 2020-08-26 MED ORDER — METOPROLOL TARTRATE 25 MG PO TABS
25.0000 mg | ORAL_TABLET | Freq: Two times a day (BID) | ORAL | Status: DC
  Administered 2020-08-26 – 2020-09-01 (×12): 25 mg via ORAL
  Filled 2020-08-26 (×12): qty 1

## 2020-08-26 MED ORDER — KETOROLAC TROMETHAMINE 15 MG/ML IJ SOLN
15.0000 mg | Freq: Four times a day (QID) | INTRAMUSCULAR | Status: AC | PRN
  Administered 2020-08-27: 15 mg via INTRAVENOUS
  Filled 2020-08-26 (×2): qty 1

## 2020-08-26 MED ORDER — INSULIN ASPART 100 UNIT/ML IJ SOLN: 0.0000 [IU] | INTRAMUSCULAR | Status: DC

## 2020-08-26 MED ORDER — POLYETHYLENE GLYCOL 3350 17 G PO PACK: 17.0000 g | PACK | Freq: Every day | ORAL | Status: DC | PRN

## 2020-08-26 MED ORDER — ONDANSETRON 4 MG PO TBDP
4.0000 mg | ORAL_TABLET | Freq: Four times a day (QID) | ORAL | Status: DC | PRN
  Filled 2020-08-26: qty 1

## 2020-08-26 NOTE — H&P (Signed)
Date of Admission:  08/26/2020  Reason for Admission:  Perforated appendicitis with abscess  History of Present Illness: William Parks is a 75 y.o. male presenting to the emergency room with a 5-day history of right lower quadrant abdominal pain.  The patient reports that his pain has been progressively getting worse.  Initially was vague located in the mid abdomen and then he feels like it moved slightly towards the left side but now more focused on the right lower quadrant.  He reports having cold chills the last couple of nights but denies any fevers.  Denies any chest pain, shortness of breath.  He does endorse nausea but no emesis.  Also reports decreased appetite over the last few days.  Denies any diarrhea or constipation but he does report having issues recently with urinary incontinence he does wear adult diapers.  He reports that he has not told his family about this.  In the emergency room, his laboratory work-up showed a white blood cell count of 12.5 with a glucose that is very elevated at 376 with normal LFTs but low albumin to 3.3.  Creatinine is normal at 1.14.  He did have a CT scan of the abdomen pelvis which showed a dilated appendix measuring up to 17 mm with a periappendiceal abscess in the right lower quadrant measuring 5 x 5.2 x 5 cm.  Past Medical History: Past Medical History:  Diagnosis Date  . Arthritis    rt shoulder  . Diabetes mellitus without complication ([place])   . Hyperlipidemia   . Hyperlipidemia associated with type 2 diabetes mellitus ([place]) 06/02/2015  . Hypertension      Past Surgical History: Past Surgical History:  Procedure Laterality Date  . CYST REMOVAL TRUNK     2x  . SHOULDER SURGERY Right 2009   rotator cuff surgery    Home Medications: Prior to Admission medications   Medication Sig Start Date End Date Taking? Authorizing Provider  aspirin 81 MG EC tablet Take 81 mg by mouth daily. 01/25/15  Yes [provider]  atorvastatin (LIPITOR)  10 MG tablet Take 1 tablet (10 mg total) by mouth daily at 6 PM. 07/16/20  Yes Sowles, Drue Stager, MD  blood glucose meter kit and supplies Dispense based on patient and insurance preference. Use up to four times daily as directed. (FOR ICD-10 E10.9, E11.9). 10/18/18  Yes Sowles, Drue Stager, MD  docusate sodium (COLACE) 100 MG capsule Take 100 mg by mouth daily.   Yes [provider]  Insulin Lispro Prot & Lispro (HUMALOG MIX 75/25 KWIKPEN) (75-25) 100 UNIT/ML Kwikpen INJECT INTO THE SKIN 2 (TWO) TIMES DAILY WITH A MEAL. 30 UNITS IN AM AND 25 UNITS BEFORE DINNER 03/05/20  Yes Sowles, Drue Stager, MD  Insulin Pen Needle 32G X 6 MM MISC SMARTSIG:SUB-Q As Directed 12/11/19  Yes [provider]  Lancets (ONETOUCH DELICA PLUS [id number]) [place] TEST UP TO 4 TIMES A DAY 10/18/18  Yes [provider]  losartan (COZAAR) 100 MG tablet Take 1 tablet (100 mg total) by mouth daily. 07/16/20  Yes Sowles, Drue Stager, MD  metoprolol tartrate (LOPRESSOR) 25 MG tablet Take 1 tablet (25 mg total) by mouth 2 (two) times daily. 07/16/20  Yes Sowles, Drue Stager, MD  NOVOFINE 32G X 6 MM MISC USE AS DIRECTED 05/15/19  Yes Sowles, Drue Stager, MD  [facility] ULTRA test strip TEST UP TO 4 TIMES A DAY 11/26/18  Yes Steele Sizer, MD  pregabalin (LYRICA) 150 MG capsule Take 1 capsule (150 mg total) by mouth 2 (  two) times daily. Patient taking differently: Take 150 mg by mouth at bedtime. 07/16/20  Yes Sowles, Drue Stager, MD  tiZANidine (ZANAFLEX) 2 MG tablet TAKE 1 TABLET (2 MG TOTAL) BY MOUTH EVERY EVENING. 06/17/20  Yes Steele Sizer, MD    Allergies: No Known Allergies  Social History:  reports that he has been smoking cigarettes. He started smoking about 53 years ago. He has a 12.50 pack-year smoking history. He has never used smokeless tobacco. He reports that he does not drink alcohol and does not use drugs.   Family History: Family History  Problem Relation Age of Onset  . Cancer Mother        Breast CA  . Diabetes  Mother   . Hypertension Mother   . Diabetes Brother     Review of Systems: Review of Systems  Constitutional: Positive for chills. Negative for fever.  HENT: Negative for hearing loss.   Respiratory: Negative for shortness of breath.   Cardiovascular: Negative for chest pain.  Gastrointestinal: Positive for abdominal pain and nausea. Negative for constipation, diarrhea and vomiting.  Genitourinary:       Urinary incontinence  Musculoskeletal: Negative for myalgias.  Skin: Negative for rash.  Neurological: Negative for dizziness.  Psychiatric/Behavioral: Negative for depression.    Physical Exam BP 137/85   Pulse 93   Temp 98.5 F (36.9 C)   Resp 17   SpO2 100%  CONSTITUTIONAL: No acute distress HEENT:  Normocephalic, atraumatic, extraocular motion intact. NECK: Trachea is midline, and there is no jugular venous distension.  RESPIRATORY:  Lungs are clear, and breath sounds are equal bilaterally. Normal respiratory effort without pathologic use of accessory muscles. CARDIOVASCULAR: Heart is regular without murmurs, gallops, or rubs. GI: The abdomen is soft, nondistended, with tenderness to palpation in the right lower quadrant.  No peritonitis.  He does have small umbilical hernia.   MUSCULOSKELETAL:  Normal muscle strength and tone in all four extremities.  No peripheral edema or cyanosis. SKIN: Skin turgor is normal. There are no pathologic skin lesions.  NEUROLOGIC:  Motor and sensation is grossly normal.  Cranial nerves are grossly intact. PSYCH:  Alert and oriented to person, place and time. Affect is normal.  Laboratory Analysis: Results for orders placed or performed during the hospital encounter of 08/26/20 (from the past 24 hour(s))  Lipase, blood     Status: None   Collection Time: 08/26/20  3:29 PM  Result Value Ref Range   Lipase 20 11 - 51 U/L  Comprehensive metabolic panel     Status: Abnormal   Collection Time: 08/26/20  3:29 PM  Result Value Ref Range    Sodium 132 (L) 135 - 145 mmol/L   Potassium 4.2 3.5 - 5.1 mmol/L   Chloride 95 (L) 98 - 111 mmol/L   CO2 25 22 - 32 mmol/L   Glucose, Bld 376 (H) 70 - 99 mg/dL   BUN 20 8 - 23 mg/dL   Creatinine, Ser 1.14 0.61 - 1.24 mg/dL   Calcium 9.0 8.9 - 10.3 mg/dL   Total Protein 8.0 6.5 - 8.1 g/dL   Albumin 3.3 (L) 3.5 - 5.0 g/dL   AST 21 15 - 41 U/L   ALT 28 0 - 44 U/L   Alkaline Phosphatase 78 38 - 126 U/L   Total Bilirubin 1.2 0.3 - 1.2 mg/dL   GFR, Estimated >60 >60 mL/min   Anion gap 12 5 - 15  CBC     Status: Abnormal   Collection Time: 08/26/20  3:29 PM  Result Value Ref Range   WBC 12.5 (H) 4.0 - 10.5 K/uL   RBC 5.02 4.22 - 5.81 MIL/uL   Hemoglobin 14.3 13.0 - 17.0 g/dL   HCT 43.0 39.0 - 52.0 %   MCV 85.7 80.0 - 100.0 fL   MCH 28.5 26.0 - 34.0 pg   MCHC 33.3 30.0 - 36.0 g/dL   RDW 13.2 11.5 - 15.5 %   Platelets 228 150 - 400 K/uL   nRBC 0.0 0.0 - 0.2 %    Imaging: CT ABDOMEN PELVIS W CONTRAST  Result Date: 08/26/2020 CLINICAL DATA:  Right lower quadrant pain for 4 days EXAM: CT ABDOMEN AND PELVIS WITH CONTRAST TECHNIQUE: Multidetector CT imaging of the abdomen and pelvis was performed using the standard protocol following bolus administration of intravenous contrast. CONTRAST:  159m OMNIPAQUE IOHEXOL 300 MG/ML  SOLN COMPARISON:  10/12/2015 FINDINGS: Lower chest: Hypoventilatory changes are seen at the lung bases. No acute pleural or parenchymal lung disease. Hepatobiliary: Stable subcentimeter cyst right lobe liver. No other focal liver abnormalities. No biliary dilation. The gallbladder is unremarkable. Pancreas: Unremarkable. No pancreatic ductal dilatation or surrounding inflammatory changes. Spleen: Normal in size without focal abnormality. Adrenals/Urinary Tract: Simple cyst upper pole left kidney. The kidneys enhance normally and symmetrically. No urinary tract calculi or obstructive uropathy. The adrenals and bladder are unremarkable. Stomach/Bowel: There is a dilated inflamed  appendix measuring up to 17 mm, consistent with acute appendicitis. There is a multilocular periappendiceal abscess in the right lower quadrant measuring 5.0 x 5.2 x 5.0 cm. No bowel obstruction or ileus. Vascular/Lymphatic: Scattered atherosclerosis throughout the aorta. No pathologic adenopathy. Small mesenteric lymph nodes in the right lower quadrant are reactive. Reproductive: Prostate is unremarkable. Other: No free fluid or free gas. Mesenteric edema surrounding the appendicitis and periappendiceal abscess in the right lower quadrant. No abdominal wall hernia. Musculoskeletal: No acute or destructive bony lesions. Reconstructed images demonstrate no additional findings. IMPRESSION: 1. Acute appendicitis, with 5.0 x 5.2 x 5.0 cm periappendiceal abscess in the right lower quadrant. Critical Value/emergent results were called by telephone at the time of interpretation on 08/26/2020 at 4:44 pm to provider PMerlyn Lot, who verbally acknowledged these results. Electronically Signed   By: MRanda NgoM.D.   On: 08/26/2020 16:44    Assessment and Plan: This is a 75y.o. male with rupture appendicitis with a 5 cm abscess in the right lower quadrant.  - Discussed with the patient the laboratory and CT scan findings and discussed that he has a perforated appendicitis with a contained abscess measuring 5 cm.  Given the inflammation and abscess formation, the treatment plan will involve conservative measures with percutaneous drainage catheter placement instead of surgical management.  I have discussed with Dr. LGolden Circlewith radiology was also discussed the patient with Dr. WEarleen Newportand the plan would be for percutaneous drainage tomorrow with interventional radiology.  In the meantime, we will admit the patient to the surgical team, we will keep him n.p.o., with IV fluid hydration, start IV antibiotics, and have him on adequate pain and nausea medication.  We will also order his blood pressure medications.  Given  his uncontrolled diabetes, with a last A1c of 10.1 two months ago, we will also consult the hospitalist team for assistance with management of his diabetes.  The patient also mentions that he has been having issues with urinary incontinence recently and he has not mentioned this to his family.  He does not have issues with initiating  a stream but he does have issues of being able to hold his urine.  We will consult with urology tomorrow for evaluation of the patient.   Melvyn Neth, MD [facility] Pg:  [phone]

## 2020-08-26 NOTE — ED Triage Notes (Addendum)
Pt come with c/o RLQ pain for 4 days. Pt denies any N/V/D.  Pt states no intake for 4 days. Pt states no appetite.

## 2020-08-26 NOTE — Consult Note (Signed)
[facility] Medical Consultation  William Parks OEH:[id number] DOB: 09-19-1945 DOA: 08/26/2020 PCP: Steele Sizer, MD   Requesting physician: Dr. Hampton Abbot Date of consultation: 08/26/20   Reason for consultation: uncontrolled DM2 Impression/Recommendations  Active Problems:   Hypertension   Dyslipidemia   Ruptured appendicitis   Acute appendicitis with localized peritonitis and abscess   DM2 (diabetes mellitus, type 2) ([place])   DM (diabetes mellitus), type 2, uncontrolled ([place])  DM2 -  - Order Sensitive   SSI   - home insulin regimen or switch to   Lantus 25 units,  -  check TSH and HgA1C  diabetes Coordinator consult  2. HTN - Cont Cozaar and metoprolol as BP allows  3. HLD - continue Lipitor  4.  Acute appendicitis with localized peritonitis and abscess as per primary team agree with IV antibiotics plan for IR drain in a.m. keep n.p.o. post midnight  5.  Tobacco abuse -encouraged tobacco cessation nicotine patch ordered  6.  Diabetes induced neuropathy continue Lyrica  I will followup again tomorrow. Please contact me if I can be of assistance in the meanwhile. Thank you for this consultation.  Chief Complaint:  Chief Complaint  Patient presents with  . Abdominal Pain   HPI: 75 yo M DM2, HTN, HLD, neuropothy   came in with Right Lq pain 5 days no nausea voting or fever, not taking his insulin or BP meds for the past 3 days. He was too weak to even get to get his medicine He smokes on occasion no EtOh He had covid vaccine but no booster Reports at baseline he has no chest pain or shortness of breath he is very active able to walk long distances prior to this, he has low grandson and places him at risk without any chest pain Patient also has endorses some point some urinary incontinence UA pending   and was found to have perforated appendicitis General surgery was called for consult to admit  made NPO started on zosyn IR was consulted for image guided drain  placement   Review of Systems:,   Pertinent positives include:    abdominal pain, urinary incontinence   Constitutional:  No weight loss, night sweats, Fevers, chills, weight loss fatigue  HEENT:  No headaches, Difficulty swallowing,Tooth/dental problems,Sore throat,  No sneezing, itching, ear ache, nasal congestion, post nasal drip,  Cardio-vascular:  No chest pain, Orthopnea, PND, anasarca, dizziness, palpitations.no Bilateral lower extremity swelling  GI:  No heartburn, indigestion nausea, vomiting, diarrhea, change in bowel habits, loss of appetite, melena, blood in stool, hematemesis Resp:  No shortness of breath at rest. No dyspnea on exertion,No excess mucus, no productive cough, No non-productive cough, No coughing up of blood.No change in color of mucus.No wheezing. Skin:  no rash or lesions. No jaundice GU:  no dysuria, change in color of urine, no urgency or frequency. No straining to urinate.  No flank pain.  Musculoskeletal:  No joint pain or no joint swelling. No decreased range of motion. No back pain.  Psych:  No change in mood or affect. No depression or anxiety. No memory loss.  Neuro: no localizing neurological complaints, no tingling, no weakness, no double vision, no gait abnormality, no slurred speech, no confusion   All systems reviewed and apart from [place] all are negative  Past Medical History:  Diagnosis Date  . Arthritis    rt shoulder  . Diabetes mellitus without complication ([place])   . Hyperlipidemia   . Hyperlipidemia associated with type 2 diabetes mellitus (  [place]) 06/02/2015  . Hypertension    Past Surgical History:  Procedure Laterality Date  . CYST REMOVAL TRUNK     2x  . SHOULDER SURGERY Right 2009   rotator cuff surgery   Social History:  reports that he has been smoking cigarettes. He started smoking about 53 years ago. He has a 12.50 pack-year smoking history. He has never used smokeless tobacco. He reports that he does not drink alcohol  and does not use drugs.  No Known Allergies Family History  Problem Relation Age of Onset  . Cancer Mother        Breast CA  . Diabetes Mother   . Hypertension Mother   . Diabetes Brother     Prior to Admission medications   Medication Sig Start Date End Date Taking? Authorizing Provider  aspirin 81 MG EC tablet Take 81 mg by mouth daily. 01/25/15  Yes [provider]  atorvastatin (LIPITOR) 10 MG tablet Take 1 tablet (10 mg total) by mouth daily at 6 PM. 07/16/20  Yes Sowles, Drue Stager, MD  blood glucose meter kit and supplies Dispense based on patient and insurance preference. Use up to four times daily as directed. (FOR ICD-10 E10.9, E11.9). 10/18/18  Yes Sowles, Drue Stager, MD  docusate sodium (COLACE) 100 MG capsule Take 100 mg by mouth daily.   Yes [provider]  Insulin Lispro Prot & Lispro (HUMALOG MIX 75/25 KWIKPEN) (75-25) 100 UNIT/ML Kwikpen INJECT INTO THE SKIN 2 (TWO) TIMES DAILY WITH A MEAL. 30 UNITS IN AM AND 25 UNITS BEFORE DINNER 03/05/20  Yes Sowles, Drue Stager, MD  Insulin Pen Needle 32G X 6 MM MISC SMARTSIG:SUB-Q As Directed 12/11/19  Yes [provider]  Lancets (ONETOUCH DELICA PLUS [id number]) [place] TEST UP TO 4 TIMES A DAY 10/18/18  Yes [provider]  losartan (COZAAR) 100 MG tablet Take 1 tablet (100 mg total) by mouth daily. 07/16/20  Yes Sowles, Drue Stager, MD  metoprolol tartrate (LOPRESSOR) 25 MG tablet Take 1 tablet (25 mg total) by mouth 2 (two) times daily. 07/16/20  Yes Sowles, Drue Stager, MD  NOVOFINE 32G X 6 MM MISC USE AS DIRECTED 05/15/19  Yes Sowles, Drue Stager, MD  [facility] ULTRA test strip TEST UP TO 4 TIMES A DAY 11/26/18  Yes Steele Sizer, MD  pregabalin (LYRICA) 150 MG capsule Take 1 capsule (150 mg total) by mouth 2 (two) times daily. Patient taking differently: Take 150 mg by mouth at bedtime. 07/16/20  Yes Sowles, Drue Stager, MD  tiZANidine (ZANAFLEX) 2 MG tablet TAKE 1 TABLET (2 MG TOTAL) BY MOUTH EVERY EVENING. 06/17/20  Yes Steele Sizer, MD   Physical Exam: Blood pressure 137/85, pulse 93, temperature 98.5 F (36.9 C), resp. rate 17, SpO2 100 %. Vitals:   08/26/20 1530  BP: 137/85  Pulse: 93  Resp: 17  Temp: 98.5 F (36.9 C)  SpO2: 100%     1. General:  in No Acute distress   well -appearing  2. Psychological: Alert and   Oriented  3. Head/ENT:      Dry Mucous Membranes                           Head Non traumatic, neck supple                             Poor Dentition  4. SKIN:   decreased Skin turgor,  Skin clean Dry and intact  no rash  5. Heart: Regular rate and rhythm no  Murmur, no Rub or gallop  6. Lungs:  no wheezes or crackles    7. Abdomen: Soft, RLQ  tender, Non distended bowel sounds diminished  8. Lower extremities: no clubbing, cyanosis, or  edema  9. Neurologically Grossly intact, moving all 4 extremities equally   10. MSK: Normal range of motion  Labs on Admission:  Basic Metabolic Panel: Recent Labs  Lab 08/26/20 1529  NA 132*  K 4.2  CL 95*  CO2 25  GLUCOSE 376*  BUN 20  CREATININE 1.14  CALCIUM 9.0   Liver Function Tests: Recent Labs  Lab 08/26/20 1529  AST 21  ALT 28  ALKPHOS 78  BILITOT 1.2  PROT 8.0  ALBUMIN 3.3*   Recent Labs  Lab 08/26/20 1529  LIPASE 20   No results for input(s): AMMONIA in the last 168 hours. CBC: Recent Labs  Lab 08/26/20 1529  WBC 12.5*  HGB 14.3  HCT 43.0  MCV 85.7  PLT 228   Cardiac Enzymes: No results for input(s): CKTOTAL, CKMB, CKMBINDEX, TROPONINI in the last 168 hours. BNP: Invalid input(s): POCBNP CBG: No results for input(s): GLUCAP in the last 168 hours.  Radiological Exams on Admission: CT ABDOMEN PELVIS W CONTRAST  Result Date: 08/26/2020 CLINICAL DATA:  Right lower quadrant pain for 4 days EXAM: CT ABDOMEN AND PELVIS WITH CONTRAST TECHNIQUE: Multidetector CT imaging of the abdomen and pelvis was performed using the standard protocol following bolus administration of intravenous contrast.  CONTRAST:  143m OMNIPAQUE IOHEXOL 300 MG/ML  SOLN COMPARISON:  10/12/2015 FINDINGS: Lower chest: Hypoventilatory changes are seen at the lung bases. No acute pleural or parenchymal lung disease. Hepatobiliary: Stable subcentimeter cyst right lobe liver. No other focal liver abnormalities. No biliary dilation. The gallbladder is unremarkable. Pancreas: Unremarkable. No pancreatic ductal dilatation or surrounding inflammatory changes. Spleen: Normal in size without focal abnormality. Adrenals/Urinary Tract: Simple cyst upper pole left kidney. The kidneys enhance normally and symmetrically. No urinary tract calculi or obstructive uropathy. The adrenals and bladder are unremarkable. Stomach/Bowel: There is a dilated inflamed appendix measuring up to 17 mm, consistent with acute appendicitis. There is a multilocular periappendiceal abscess in the right lower quadrant measuring 5.0 x 5.2 x 5.0 cm. No bowel obstruction or ileus. Vascular/Lymphatic: Scattered atherosclerosis throughout the aorta. No pathologic adenopathy. Small mesenteric lymph nodes in the right lower quadrant are reactive. Reproductive: Prostate is unremarkable. Other: No free fluid or free gas. Mesenteric edema surrounding the appendicitis and periappendiceal abscess in the right lower quadrant. No abdominal wall hernia. Musculoskeletal: No acute or destructive bony lesions. Reconstructed images demonstrate no additional findings. IMPRESSION: 1. Acute appendicitis, with 5.0 x 5.2 x 5.0 cm periappendiceal abscess in the right lower quadrant. Critical Value/emergent results were called by telephone at the time of interpretation on 08/26/2020 at 4:44 pm to provider PMerlyn Lot, who verbally acknowledged these results. Electronically Signed   By: MRanda NgoM.D.   On: 08/26/2020 16:44    EKG: Independently reviewed.  Normal sinus rhythm no acute ischemic changes No heart rate 95 QRS within normal limits Time spent: 55 min  Stormey Wilborn [facility] Pager [phone] If 7PM-7AM, please contact night-coverage [email or web address] Password [email or web address] 08/26/2020, 8:08 PM

## 2020-08-26 NOTE — ED Notes (Signed)
edt in to get covid swab.  patietn instructed to get urine specimen when he can.  IVF is running.

## 2020-08-26 NOTE — ED Provider Notes (Signed)
[facility] Emergency Department Provider Note    Event Date/Time   First MD Initiated Contact with Patient 08/26/20 1553     (approximate)  I have reviewed the triage vital signs and the nursing notes.   HISTORY  Chief Complaint Abdominal Pain    HPI William Parks is a 75 y.o. male with the below listed past medical history presents to the ER for evaluation of several days of anorexia now with nausea as well as right lower quadrant pain is never had pain like this before.  No diarrhea.  No measured fevers but thinks he has had some chills.  Denies any chest pain.  No history of kidney stones.  No previous abdominal surgeries.    Past Medical History:  Diagnosis Date  . Arthritis    rt shoulder  . Diabetes mellitus without complication ([place])   . Hyperlipidemia   . Hyperlipidemia associated with type 2 diabetes mellitus ([place]) 06/02/2015  . Hypertension    Family History  Problem Relation Age of Onset  . Cancer Mother        Breast CA  . Diabetes Mother   . Hypertension Mother   . Diabetes Brother    Past Surgical History:  Procedure Laterality Date  . CYST REMOVAL TRUNK     2x  . SHOULDER SURGERY Right 2009   rotator cuff surgery   Patient Active Problem List   Diagnosis Date Noted  . Atherosclerosis of aorta ([place]) 10/03/2019  . Neck and shoulder pain 11/08/2016  . Tobacco abuse counseling 10/25/2016  . Vitamin D deficiency 10/15/2015  . Dyslipidemia 06/02/2015  . Hypertension 04/01/2015  . Uncontrolled type 2 diabetes mellitus with peripheral neuropathy ([place]) 04/01/2015      Prior to Admission medications   Medication Sig Start Date End Date Taking? Authorizing Provider  aspirin 81 MG EC tablet Take 81 mg by mouth daily. 01/25/15   [provider]  atorvastatin (LIPITOR) 10 MG tablet Take 1 tablet (10 mg total) by mouth daily at 6 PM. 07/16/20   Steele Sizer, MD  blood glucose meter kit and supplies Dispense based on  patient and insurance preference. Use up to four times daily as directed. (FOR ICD-10 E10.9, E11.9). 10/18/18   Steele Sizer, MD  Cholecalciferol (VITAMIN D3) 2000 units TABS Take by mouth.    [provider]  docusate sodium (COLACE) 100 MG capsule Take 100 mg by mouth daily.    [provider]  Insulin Lispro Prot & Lispro (HUMALOG MIX 75/25 KWIKPEN) (75-25) 100 UNIT/ML Kwikpen INJECT INTO THE SKIN 2 (TWO) TIMES DAILY WITH A MEAL. 30 UNITS IN AM AND 25 UNITS BEFORE DINNER 03/05/20   Steele Sizer, MD  Insulin Pen Needle 32G X 6 MM MISC SMARTSIG:SUB-Q As Directed 12/11/19   [provider]  Lancets (ONETOUCH DELICA PLUS [id number]) [place] TEST UP TO 4 TIMES A DAY 10/18/18   [provider]  losartan (COZAAR) 100 MG tablet Take 1 tablet (100 mg total) by mouth daily. 07/16/20   Steele Sizer, MD  metoprolol tartrate (LOPRESSOR) 25 MG tablet Take 1 tablet (25 mg total) by mouth 2 (two) times daily. 07/16/20   Steele Sizer, MD  NOVOFINE 32G X 6 MM MISC USE AS DIRECTED 05/15/19   Steele Sizer, MD  [facility] ULTRA test strip TEST UP TO 4 TIMES A DAY 11/26/18   Steele Sizer, MD  pregabalin (LYRICA) 150 MG capsule Take 1 capsule (150 mg total) by mouth 2 (two) times daily.  07/16/20   Steele Sizer, MD  tiZANidine (ZANAFLEX) 2 MG tablet TAKE 1 TABLET (2 MG TOTAL) BY MOUTH EVERY EVENING. 06/17/20   Steele Sizer, MD    Allergies Patient has no known allergies.    Social History Social History   Tobacco Use  . Smoking status: Light Tobacco Smoker    Packs/day: 0.25    Years: 50.00    Pack years: 12.50    Types: Cigarettes    Start date: 07/27/1967  . Smokeless tobacco: Never Used  . Tobacco comment: 2-3 cigarettes per day, smoking cessation program information provided  Vaping Use  . Vaping Use: Never used  Substance Use Topics  . Alcohol use: No    Alcohol/week: 0.0 standard drinks  . Drug use: No    Review of Systems Patient denies headaches,  rhinorrhea, blurry vision, numbness, shortness of breath, chest pain, edema, cough, abdominal pain, nausea, vomiting, diarrhea, dysuria, fevers, rashes or hallucinations unless otherwise stated above in HPI. ____________________________________________   PHYSICAL EXAM:  VITAL SIGNS: Vitals:   08/26/20 1530  BP: 137/85  Pulse: 93  Resp: 17  Temp: 98.5 F (36.9 C)  SpO2: 100%    Constitutional: Alert and oriented.  Eyes: Conjunctivae are normal.  Head: Atraumatic. Nose: No congestion/rhinnorhea. Mouth/Throat: Mucous membranes are moist.   Neck: No stridor. Painless ROM.  Cardiovascular: Normal rate, regular rhythm. Grossly normal heart sounds.  Good peripheral circulation. Respiratory: Normal respiratory effort.  No retractions. Lungs CTAB. Gastrointestinal: Soft with rlq ttp. + guarding, No rebound. No distention. No abdominal bruits. No CVA tenderness. Genitourinary:  Musculoskeletal: No lower extremity tenderness nor edema.  No joint effusions. Neurologic:  Normal speech and language. No gross focal neurologic deficits are appreciated. No facial droop Skin:  Skin is warm, dry and intact. No rash noted. Psychiatric: Mood and affect are normal. Speech and behavior are normal.  ____________________________________________   LABS (all labs ordered are listed, but only abnormal results are displayed)  Results for orders placed or performed during the hospital encounter of 08/26/20 (from the past 24 hour(s))  Lipase, blood     Status: None   Collection Time: 08/26/20  3:29 PM  Result Value Ref Range   Lipase 20 11 - 51 U/L  Comprehensive metabolic panel     Status: Abnormal   Collection Time: 08/26/20  3:29 PM  Result Value Ref Range   Sodium 132 (L) 135 - 145 mmol/L   Potassium 4.2 3.5 - 5.1 mmol/L   Chloride 95 (L) 98 - 111 mmol/L   CO2 25 22 - 32 mmol/L   Glucose, Bld 376 (H) 70 - 99 mg/dL   BUN 20 8 - 23 mg/dL   Creatinine, Ser 1.14 0.61 - 1.24 mg/dL   Calcium 9.0  8.9 - 10.3 mg/dL   Total Protein 8.0 6.5 - 8.1 g/dL   Albumin 3.3 (L) 3.5 - 5.0 g/dL   AST 21 15 - 41 U/L   ALT 28 0 - 44 U/L   Alkaline Phosphatase 78 38 - 126 U/L   Total Bilirubin 1.2 0.3 - 1.2 mg/dL   GFR, Estimated >60 >60 mL/min   Anion gap 12 5 - 15  CBC     Status: Abnormal   Collection Time: 08/26/20  3:29 PM  Result Value Ref Range   WBC 12.5 (H) 4.0 - 10.5 K/uL   RBC 5.02 4.22 - 5.81 MIL/uL   Hemoglobin 14.3 13.0 - 17.0 g/dL   HCT 43.0 39.0 - 52.0 %  MCV 85.7 80.0 - 100.0 fL   MCH 28.5 26.0 - 34.0 pg   MCHC 33.3 30.0 - 36.0 g/dL   RDW 13.2 11.5 - 15.5 %   Platelets 228 150 - 400 K/uL   nRBC 0.0 0.0 - 0.2 %   ____________________________________________  EKG My review and personal interpretation at Time: 17:02   Indication: abd pain  Rate: 95  Rhythm: sinus Axis: normal Other: normal intervals, no stemi ____________________________________________  RADIOLOGY  I personally reviewed all radiographic images ordered to evaluate for the above acute complaints and reviewed radiology reports and findings.  These findings were personally discussed with the patient.  Please see medical record for radiology report.  ____________________________________________   PROCEDURES  Procedure(s) performed:  Procedures    Critical Care performed: no ____________________________________________   INITIAL IMPRESSION / ASSESSMENT AND PLAN / ED COURSE  Pertinent labs & imaging results that were available during my care of the patient were reviewed by me and considered in my medical decision making (see chart for details).   DDX: Appendicitis, colitis, diverticulitis, stone, cystitis  William Parks is a 75 y.o. who presents to the ED with presentation as described above.  Patient is afebrile clinically well-appearing however does have right lower quadrant tenderness to palpation as described above on exam with mild leukocytosis.  Given presentation highly suspicious for  acute appendicitis.  CT imaging will be ordered for above differential.  Patient declining any IV pain medication at this time.  Clinical Course as of 08/26/20 1706  Tue Aug 26, 2020  1643 My review of CT does identify probable appendicitis with stranding in the right lower quadrant. [PR]  1652 Case discussed with Dr. Hampton Abbot general surgery who agrees to admit patient and will evaluate at bedside for further management.  Have discussed with the patient and available family all diagnostics and treatments performed thus far and all questions were answered to the best of my ability. The patient demonstrates understanding and agreement with plan.  [PR]    Clinical Course User Index [PR] Merlyn Lot, MD    The patient was evaluated in Emergency Department today for the symptoms described in the history of present illness. He/she was evaluated in the context of the global COVID-19 pandemic, which necessitated consideration that the patient might be at risk for infection with the SARS-CoV-2 virus that causes COVID-19. Institutional protocols and algorithms that pertain to the evaluation of patients at risk for COVID-19 are in a state of rapid change based on information released by regulatory bodies including the CDC and federal and state organizations. These policies and algorithms were followed during the patient's care in the ED.  As part of my medical decision making, I reviewed the following data within the [place] notes reviewed and incorporated, Labs reviewed, notes from prior ED visits and [place] Controlled Substance Database   ____________________________________________   FINAL CLINICAL IMPRESSION(S) / ED DIAGNOSES  Final diagnoses:  Acute appendicitis with localized peritonitis and abscess, unspecified whether gangrene present, unspecified whether perforation present      NEW MEDICATIONS STARTED DURING THIS VISIT:  New Prescriptions   No medications  on file     Note:  This document was prepared using Dragon voice recognition software and may include unintentional dictation errors.    Merlyn Lot, MD 08/26/20 [phone]

## 2020-08-27 ENCOUNTER — Encounter: Payer: Self-pay | Admitting: Surgery

## 2020-08-27 ENCOUNTER — Inpatient Hospital Stay: Payer: Medicare Other

## 2020-08-27 DIAGNOSIS — K3532 Acute appendicitis with perforation and localized peritonitis, without abscess: Secondary | ICD-10-CM | POA: Diagnosis not present

## 2020-08-27 DIAGNOSIS — E119 Type 2 diabetes mellitus without complications: Secondary | ICD-10-CM

## 2020-08-27 DIAGNOSIS — K3533 Acute appendicitis with perforation and localized peritonitis, with abscess: Secondary | ICD-10-CM | POA: Diagnosis not present

## 2020-08-27 DIAGNOSIS — I10 Essential (primary) hypertension: Secondary | ICD-10-CM | POA: Diagnosis not present

## 2020-08-27 DIAGNOSIS — E785 Hyperlipidemia, unspecified: Secondary | ICD-10-CM | POA: Diagnosis not present

## 2020-08-27 LAB — GLUCOSE, CAPILLARY
Glucose-Capillary: 227 mg/dL — ABNORMAL HIGH (ref 70–99)
Glucose-Capillary: 178 mg/dL — ABNORMAL HIGH (ref 70–99)
Glucose-Capillary: 207 mg/dL — ABNORMAL HIGH (ref 70–99)
Glucose-Capillary: 210 mg/dL — ABNORMAL HIGH (ref 70–99)
Glucose-Capillary: 182 mg/dL — ABNORMAL HIGH (ref 70–99)
Glucose-Capillary: 153 mg/dL — ABNORMAL HIGH (ref 70–99)

## 2020-08-27 LAB — CBC WITH DIFFERENTIAL/PLATELET
Abs Immature Granulocytes: 0.04 10*3/uL (ref 0.00–0.07)
Basophils Absolute: 0 10*3/uL (ref 0.0–0.1)
Basophils Relative: 0 %
Eosinophils Absolute: 0 10*3/uL (ref 0.0–0.5)
Eosinophils Relative: 0 %
HCT: 40.7 % (ref 39.0–52.0)
Hemoglobin: 14.2 g/dL (ref 13.0–17.0)
Immature Granulocytes: 0 %
Lymphocytes Relative: 5 %
Lymphs Abs: 0.7 10*3/uL (ref 0.7–4.0)
MCH: 29.5 pg (ref 26.0–34.0)
MCHC: 34.9 g/dL (ref 30.0–36.0)
MCV: 84.4 fL (ref 80.0–100.0)
Monocytes Absolute: 1.2 10*3/uL — ABNORMAL HIGH (ref 0.1–1.0)
Monocytes Relative: 9 %
Neutro Abs: 11.2 10*3/uL — ABNORMAL HIGH (ref 1.7–7.7)
Neutrophils Relative %: 86 %
Platelets: 214 10*3/uL (ref 150–400)
RBC: 4.82 MIL/uL (ref 4.22–5.81)
RDW: 13.2 % (ref 11.5–15.5)
WBC: 13.2 10*3/uL — ABNORMAL HIGH (ref 4.0–10.5)
nRBC: 0 % (ref 0.0–0.2)

## 2020-08-27 LAB — COMPREHENSIVE METABOLIC PANEL
ALT: 27 U/L (ref 0–44)
AST: 20 U/L (ref 15–41)
Albumin: 2.9 g/dL — ABNORMAL LOW (ref 3.5–5.0)
Alkaline Phosphatase: 76 U/L (ref 38–126)
Anion gap: 11 (ref 5–15)
BUN: 19 mg/dL (ref 8–23)
CO2: 26 mmol/L (ref 22–32)
Calcium: 8.8 mg/dL — ABNORMAL LOW (ref 8.9–10.3)
Chloride: 100 mmol/L (ref 98–111)
Creatinine, Ser: 1.12 mg/dL (ref 0.61–1.24)
GFR, Estimated: >60 mL/min (ref >60)
Glucose, Bld: 263 mg/dL — ABNORMAL HIGH (ref 70–99)
Potassium: 4 mmol/L (ref 3.5–5.1)
Sodium: 137 mmol/L (ref 135–145)
Total Bilirubin: 1.2 mg/dL (ref 0.3–1.2)
Total Protein: 7.6 g/dL (ref 6.5–8.1)

## 2020-08-27 LAB — LIPID PANEL
Cholesterol: 135 mg/dL (ref 0–200)
HDL: 49 mg/dL (ref >40)
LDL Cholesterol: 74 mg/dL (ref 0–99)
Total CHOL/HDL Ratio: 2.8 RATIO
Triglycerides: 62 mg/dL (ref <150)
VLDL: 12 mg/dL (ref 0–40)

## 2020-08-27 LAB — BASIC METABOLIC PANEL
Anion gap: 12 (ref 5–15)
BUN: 19 mg/dL (ref 8–23)
CO2: 24 mmol/L (ref 22–32)
Calcium: 8.8 mg/dL — ABNORMAL LOW (ref 8.9–10.3)
Chloride: 100 mmol/L (ref 98–111)
Creatinine, Ser: 1.07 mg/dL (ref 0.61–1.24)
GFR, Estimated: >60 mL/min (ref >60)
Glucose, Bld: 189 mg/dL — ABNORMAL HIGH (ref 70–99)
Potassium: 3.8 mmol/L (ref 3.5–5.1)
Sodium: 136 mmol/L (ref 135–145)

## 2020-08-27 LAB — CBC
HCT: 41.5 % (ref 39.0–52.0)
Hemoglobin: 14.2 g/dL (ref 13.0–17.0)
MCH: 29.2 pg (ref 26.0–34.0)
MCHC: 34.2 g/dL (ref 30.0–36.0)
MCV: 85.2 fL (ref 80.0–100.0)
Platelets: 208 10*3/uL (ref 150–400)
RBC: 4.87 MIL/uL (ref 4.22–5.81)
RDW: 13.3 % (ref 11.5–15.5)
WBC: 10.1 10*3/uL (ref 4.0–10.5)
nRBC: 0 % (ref 0.0–0.2)

## 2020-08-27 LAB — PROTIME-INR
INR: 1.2 (ref 0.8–1.2)
Prothrombin Time: 15.2 seconds (ref 11.4–15.2)

## 2020-08-27 LAB — LACTIC ACID, PLASMA: Lactic Acid, Venous: 1.7 mmol/L (ref 0.5–1.9)

## 2020-08-27 LAB — MAGNESIUM: Magnesium: 1.8 mg/dL (ref 1.7–2.4)

## 2020-08-27 LAB — HEMOGLOBIN A1C
Hgb A1c MFr Bld: 10.5 % — ABNORMAL HIGH (ref 4.8–5.6)
Mean Plasma Glucose: 254.65 mg/dL

## 2020-08-27 MED ORDER — ONDANSETRON HCL 4 MG/2ML IJ SOLN
4.0000 mg | Freq: Once | INTRAMUSCULAR | Status: DC
  Filled 2020-08-27 (×2): qty 2

## 2020-08-27 MED ORDER — INSULIN ASPART 100 UNIT/ML IJ SOLN
0.0000 [IU] | INTRAMUSCULAR | Status: DC
  Administered 2020-08-27: 3 [IU] via SUBCUTANEOUS
  Administered 2020-08-27: 5 [IU] via SUBCUTANEOUS
  Administered 2020-08-27: 3 [IU] via SUBCUTANEOUS
  Administered 2020-08-27: 5 [IU] via SUBCUTANEOUS
  Administered 2020-08-28: 2 [IU] via SUBCUTANEOUS
  Administered 2020-08-28: 5 [IU] via SUBCUTANEOUS
  Administered 2020-08-28: 2 [IU] via SUBCUTANEOUS
  Administered 2020-08-29: 3 [IU] via SUBCUTANEOUS
  Filled 2020-08-27 (×7): qty 1

## 2020-08-27 MED ORDER — ACETAMINOPHEN 500 MG PO TABS
1000.0000 mg | ORAL_TABLET | Freq: Four times a day (QID) | ORAL | Status: DC | PRN
  Administered 2020-08-27 – 2020-08-30 (×3): 1000 mg via ORAL
  Filled 2020-08-27 (×3): qty 2

## 2020-08-27 MED ORDER — LACTATED RINGERS IV SOLN: INTRAVENOUS | Status: DC

## 2020-08-27 MED ORDER — ONDANSETRON HCL 4 MG/2ML IJ SOLN
INTRAMUSCULAR | Status: AC
  Administered 2020-08-27: 4 mg via INTRAVENOUS
  Filled 2020-08-27: qty 2

## 2020-08-27 MED ORDER — MIDAZOLAM HCL 2 MG/2ML IJ SOLN
INTRAMUSCULAR | Status: AC
  Filled 2020-08-27: qty 4

## 2020-08-27 MED ORDER — FENTANYL CITRATE (PF) 100 MCG/2ML IJ SOLN
INTRAMUSCULAR | Status: AC | PRN
  Administered 2020-08-27 (×2): 50 ug via INTRAVENOUS

## 2020-08-27 MED ORDER — SODIUM CHLORIDE 0.9 % IV BOLUS
500.0000 mL | Freq: Once | INTRAVENOUS | Status: AC
  Administered 2020-08-27: 500 mL via INTRAVENOUS

## 2020-08-27 MED ORDER — ACETAMINOPHEN 500 MG PO TABS
ORAL_TABLET | ORAL | Status: AC
  Administered 2020-08-27: 1000 mg via ORAL
  Filled 2020-08-27: qty 2

## 2020-08-27 MED ORDER — FENTANYL CITRATE (PF) 100 MCG/2ML IJ SOLN
INTRAMUSCULAR | Status: AC
  Filled 2020-08-27: qty 4

## 2020-08-27 MED ORDER — MIDAZOLAM HCL 2 MG/2ML IJ SOLN
INTRAMUSCULAR | Status: AC | PRN
  Administered 2020-08-27: 2 mg via INTRAVENOUS
  Administered 2020-08-27: 1 mg via INTRAVENOUS

## 2020-08-27 MED ORDER — INSULIN GLARGINE 100 UNIT/ML ~~LOC~~ SOLN
30.0000 [IU] | Freq: Every day | SUBCUTANEOUS | Status: DC
  Administered 2020-08-27 – 2020-08-29 (×3): 30 [IU] via SUBCUTANEOUS
  Filled 2020-08-27 (×4): qty 0.3

## 2020-08-27 MED ORDER — MEPERIDINE HCL 25 MG/ML IJ SOLN
25.0000 mg | INTRAMUSCULAR | Status: AC
  Administered 2020-08-27: 25 mg via INTRAVENOUS

## 2020-08-27 MED ORDER — SODIUM CHLORIDE 0.9% FLUSH
5.0000 mL | Freq: Three times a day (TID) | INTRAVENOUS | Status: DC
  Administered 2020-08-27 – 2020-09-01 (×13): 5 mL

## 2020-08-27 NOTE — Progress Notes (Signed)
Pt. With '"all over body shakes." MD made aware. Pt. Med. With 4 mg IV Zofran slow push and Demerol 25 mg slow IVP per MD orders. Dr. Loreta Ave in at bedside now to assess pt. LR to 500 ml/hr. Per MD orders. Shaking subsided within 2-3 min. Post medications.

## 2020-08-27 NOTE — Plan of Care (Signed)

## 2020-08-27 NOTE — Procedures (Signed)
Interventional Radiology Procedure Note  Procedure: Image guided drain placement, RUL phlegmon.  Very little formal abscess.  8.43F pigtail drain.  Complications: None  EBL: None Sample: Culture sent  Recommendations: - Routine drain care, with sterile flushes, record output - follow up Cx - Wound remove if the output decreases below 10-15cc per day - routine wound care  Signed,  Yvone Neu. Loreta Ave, DO

## 2020-08-27 NOTE — Progress Notes (Signed)
Initial Nutrition Assessment  DOCUMENTATION CODES:  Not applicable  INTERVENTION:   Advance diet to carb modified s/p drain placement  Add Glucerna Shake po TID once diet advanced. Each supplement provides 220 kcal and 10 grams of protein  NUTRITION DIAGNOSIS:  Inadequate oral intake related to nausea,inability to eat as evidenced by NPO status,per patient/family report.  GOAL:  Patient will meet greater than or equal to 90% of their needs  MONITOR:  PO intake,Diet advancement,Supplement acceptance  REASON FOR ASSESSMENT:  Malnutrition Screening Tool    ASSESSMENT:  Pt admitted with 5 days of worsening abdominal pain. Reports poor appetite at home. Imaging in ED showed a perforated appendicitis with a contained abscess. PMH relevant for DM type 2, HTN, HLD  Pt out of room for percutaneous drain today. Surgery team consulting and does not plan for emergent surgical intervention at this time. Noted that at baseline pt lives alone, but daughter plans to have pt stay with her after dc to aid in recovery.   Nutritionally Relevant Meds: . atorvastatin  10 mg Oral QHS  . insulin aspart  0-15 Units Subcutaneous Q4H  . insulin glargine  30 Units Subcutaneous QHS  . losartan  100 mg Oral Daily  . pantoprazole (PROTONIX) IV  40 mg Intravenous QHS   Continuous Infusions: . lactated ringers 100 mL/hr at 08/27/20 0918  . piperacillin-tazobactam (ZOSYN)  IV 3.375 g (08/27/20 0919)   PRN Meds: ondansetron, polyethylene glycol  Labs reviewed:   SBG ranges from 178-317 mg/dL over the last 24 hours  HgbA1c 10.5 (5/4)  NUTRITION - FOCUSED PHYSICAL EXAM: Defer to follow-up assessment  Diet Order:   Diet Order            Diet NPO time specified Except for: Sips with Meds  Diet effective now                EDUCATION NEEDS:  No education needs have been identified at this time  Skin:  Skin Assessment: Reviewed RN Assessment  Last BM:  5/3 per RN documentation  Height:  Ht  Readings from Last 1 Encounters:  08/26/20 5\' 8"  (1.727 m)    Weight:  Wt Readings from Last 1 Encounters:  08/26/20 81.6 kg    Ideal Body Weight:  70 kg  BMI:  Body mass index is 27.35 kg/m.  Estimated Nutritional Needs:   Kcal:  2000-2100 kcal/d  Protein:  110-110 g/d  Fluid:  >2L/d   10/26/20, RD, LDN Clinical Dietitian Pager on Amion

## 2020-08-27 NOTE — Progress Notes (Addendum)
Rapid Response Event Note   Reason for Call :   Red MEWS  Initial Focused Assessment:   - Patient is responsive but somnolent - Febrile, tachycardia (see flowsheet) - Excellent BP - No acute distress  Interventions:   - Continuous IVFs initiated (per MAR) - Incentive spirometry - Patient repositioned - 2 failed attempts at insertion for second PIV - IV team consult placed - Dr. Aleen Campi notified  Plan of Care:   - Reassess in 1 hour  Event Summary:   MD Notified: Dr. Aleen Campi Call Time: 1738 hrs Arrival Time: 1740 hrs End Time: 1810 hrs  Rosana Fret, RN   Follow-up @ 1928 hrs: Temp remains 102.3, but patient was wrapped in several blankets before checking. HR and mentation improved. Patient looks subjectively better.

## 2020-08-27 NOTE — Progress Notes (Signed)
Interventional Radiology Progress Note   Called by recovery staff after CT guided drain placement into peri-appendiceal phlegmon.    Patient experiencing new rigors, and complained of "being cold".    4mg  Zofran, 25mg  demerol, and lactated ringers bolus.    HR ~100-110 SBP 150-160   Discussed with Dr. .       Signed,  . Aleen Campi, DO

## 2020-08-27 NOTE — Progress Notes (Signed)
   08/27/20 1800  Clinical Encounter Type  Visited With Patient not available;Health care provider  Visit Type Initial  Referral From Nurse   Rapid Response page received for the patient. Upon arrival, the patient was receiving care and being assessed by the medical team. This chaplain maintained pastoral presence outside of the patient's room, offering silent prayer. No needs at this time. Will continue to follow.  Clovis Riley, Chaplain

## 2020-08-27 NOTE — Progress Notes (Signed)
[facility] SURGICAL PROGRESS NOTE (cpt [phone])  Hospital Day(s): 1.   Interval History: Patient seen and examined, no acute events or new complaints overnight. Patient reports he continues to have RLQ pain but this is only slightly improved from presentation. He denies fever, chills, nausea, emesis. He did have a slight worsening of his leukocytosis to 13.2K this morning. Maintaining his renal function; sCr - 1.12. UO - 600. No significant electrolyte derangements. Lactic acid levels remain normal. He is currently on Zosyn. He remains NPO with plans for IR placement of percutaneous drain today.   Review of Systems:  Constitutional: denies fever, chills  HEENT: denies cough or congestion  Respiratory: denies any shortness of breath  Cardiovascular: denies chest pain or palpitations  Gastrointestinal: + abdominal pain, denied N/V, or diarrhea/and bowel function as per interval history Genitourinary: denies burning with urination or urinary frequency  Vital signs in last 24 hours: [min-max] current  Temp:  [98 F (36.7 C)-99.4 F (37.4 C)] 98 F (36.7 C) (05/04 0606) Pulse Rate:  [80-93] 80 (05/04 0606) Resp:  [17-18] 18 (05/04 0606) BP: (109-146)/(61-85) 111/61 (05/04 0606) SpO2:  [95 %-100 %] 98 % (05/04 0606) Weight:  [81.6 kg-86.2 kg] 81.6 kg (05/03 2048)     Height: 5\' 8"  (172.7 cm) Weight: 81.6 kg BMI (Calculated): 27.36   Intake/Output last 2 shifts:  No intake/output data recorded.   Physical Exam:  Constitutional: alert, cooperative and no distress  HENT: normocephalic without obvious abnormality  Eyes: PERRL, EOM's grossly intact and symmetric  Respiratory: breathing non-labored at rest  Cardiovascular: regular rate and sinus rhythm  Gastrointestinal: Soft, RLQ tenderness, and non-distended, no rebound/guarding Musculoskeletal: no edema or wounds, motor and sensation grossly intact, NT    Labs:  CBC Latest Ref Rng & Units 08/27/2020 08/26/2020 04/06/2019   WBC 4.0 - 10.5 K/uL 13.2(H) 12.5(H) 4.5  Hemoglobin 13.0 - 17.0 g/dL 14/02/2019 51.7 61.6  Hematocrit 39.0 - 52.0 % 40.7 43.0 49.3  Platelets 150 - 400 K/uL 214 228 229   CMP Latest Ref Rng & Units 08/27/2020 08/26/2020 12/03/2019  Glucose 70 - 99 mg/dL 02/02/2020) 710(G) 269(S)  BUN 8 - 23 mg/dL 19 20 16   Creatinine 0.61 - 1.24 mg/dL 854(O 2.70  Sodium 135 - 145 mmol/L 137 132(L) 138  Potassium 3.5 - 5.1 mmol/L 4.0 4.2 4.1  Chloride 98 - 111 mmol/L 100 95(L) 102  CO2 22 - 32 mmol/L 26 25 29   Calcium 8.9 - 10.3 mg/dL 3.50) 9.0 9.1  Total Protein 6.5 - 8.1 g/dL 7.6 8.0 6.6  Total Bilirubin 0.3 - 1.2 mg/dL 1.2 1.2 1.1  Alkaline Phos 38 - 126 U/L 76 78 -  AST 15 - 41 U/L 20 21 19   ALT 0 - 44 U/L 27 28 24      Imaging studies: No new pertinent imaging studies   Assessment/Plan:  75 y.o. male with leukocytosis and abdominal pain admitted with ruptured appendicitis with abscess, complicated by pertinent comorbidities including HTN, HLD, DM2.   - Plan for IR placement of percutaneous drain today; d/w with them this morning; appreciate their assistance  - No emergent surgical intervention. Plan for drain placement today with IR and ultimately will benefit from interval appendectomy as an outpatient in ~8 weeks  - NPO for procedure; pending clinical condition, may be able to start CLD after  - Continue IVF support  - Continue IV Abx (Zosyn)  - Monitor abdominal examination  - Monitor leucocytosis; worsening today; trend  -  Pain control prn; antiemetics prn   - Mobilization as tolerated   - Appreciate medicine assistance with comorbid conditions    All of the above findings and recommendations were discussed with the patient, and the medical team, and all of patient's questions were answered to his expressed satisfaction.  -- Lynden Oxford, PA-C [facility] 08/27/2020, 7:05 AM [phone] M-F: 7am - 4pm

## 2020-08-27 NOTE — Progress Notes (Signed)
PROGRESS NOTE We are consulted.   [facility]  ZOX:[id number] DOB: 02/15/1946 DOA: 08/26/2020 PCP: Alba CorySowles, Krichna, MD   Brief Narrative: Taken from prior notes. 75 yo M DM2, HTN, HLD, neuropothy came in with Right Lq pain 5 days no nausea voting or fever, not taking his insulin or BP meds for the past 3 days. He was too weak to even get to get his medicine.  Found to have perforated appendix, admitted under surgery service and we were consulted for diabetes management. Patient is going for percutaneous drain placement with IR today.  Subjective: Patient continues to have some right lower quadrant pain, no nausea or vomiting.  Stating that pain seems improving.  He was waiting for his procedure when seen today.  Daughter at bedside.  Per daughter she [place] to take [place] to [place] and having some collections regarding drain, advised to discussed with surgery.  Assessment & Plan:   Active Problems:   Hypertension   Dyslipidemia   Ruptured appendicitis   Acute appendicitis with localized peritonitis and abscess   DM2 (diabetes mellitus, type 2) (HCC)   DM (diabetes mellitus), type 2, uncontrolled (HCC)  Perforated appendix.  Localize peritonitis and abscess formation per CT scan.  Being managed by surgical team. -Patient will get percutaneous drain placed by IR today. -Continue with Zosyn  Type 2 diabetes mellitus.  Uncontrolled with hyperglycemia and A1c of 10.5.  CBG elevated. -Increase Lantus to 30 units daily -Switch SSI with moderate scale  Diabetes induced neuropathy. -Continue home dose of Lyrica  Essential hypertension. -Continue home dose of Cozaar and metoprolol  Dyslipidemia. -Continue Lipitor  Tobacco abuse. -Nicotine patch  Objective: Vitals:   08/27/20 1630 08/27/20 1635 08/27/20 1640 08/27/20 1700  BP: (!) 159/96  (!) 141/88 (!) 109/91  Pulse: (!) 101 (!) 101 100 (!) 106  Resp: 18 13 16  (!) 27  Temp: 98.6 F (37 C)   98.2 F (36.8 C)   TempSrc:    Oral  SpO2: 95% 90% 98% 95%  Weight:      Height:        Intake/Output Summary (Last 24 hours) at 08/27/2020 1719 Last data filed at 08/27/2020 0200 Gross per 24 hour  Intake --  Output 600 ml  Net -600 ml   Filed Weights   08/26/20 1933 08/26/20 2048  Weight: 86.2 kg 81.6 kg    Examination:  General exam: Appears calm and comfortable  Respiratory system: Clear to auscultation. Respiratory effort normal. Cardiovascular system: S1 & S2 heard, RRR.  Gastrointestinal system: Soft, mild right lower quadrant tenderness, nondistended, bowel sounds positive. Central nervous system: Alert and oriented. No focal neurological deficits.Symmetric 5 x 5 power. Extremities: No edema, no cyanosis, pulses intact and symmetrical. Psychiatry: Judgement and insight appear normal. Mood & affect appropriate.    DVT prophylaxis: SCDs Code Status: Full Family Communication: Daughter was updated at bedside Disposition Plan:  Status is: Inpatient  Level of care: Med-Surg  All the records are reviewed and case discussed with Care Management/Social Worker. Management plans discussed with the patient, nursing and they are in agreement.   Procedures:  Percutaneous drain placement with IR  Antimicrobials:  Zosyn  Data Reviewed: I have personally reviewed following labs and imaging studies  CBC: Recent Labs  Lab 08/26/20 1529 08/27/20 0101  WBC 12.5* 13.2*  NEUTROABS  --  11.2*  HGB 14.3 14.2  HCT 43.0 40.7  MCV 85.7 84.4  PLT 228 214   Basic Metabolic Panel: Recent Labs  Lab 08/26/20 1529 08/27/20 0101  NA 132* 137  K 4.2 4.0  CL 95* 100  CO2 25 26  GLUCOSE 376* 263*  BUN 20 19  CREATININE 1.14 1.12  CALCIUM 9.0 8.8*  MG  --  1.8   GFR: Estimated Creatinine Clearance: 55.1 mL/min (by C-G formula based on SCr of 1.12 mg/dL). Liver Function Tests: Recent Labs  Lab 08/26/20 1529 08/27/20 0101  AST 21 20  ALT 28 27  ALKPHOS 78 76  BILITOT 1.2 1.2  PROT  8.0 7.6  ALBUMIN 3.3* 2.9*   Recent Labs  Lab 08/26/20 1529  LIPASE 20   No results for input(s): AMMONIA in the last 168 hours. Coagulation Profile: Recent Labs  Lab 08/27/20 0101  INR 1.2   Cardiac Enzymes: No results for input(s): CKTOTAL, CKMB, CKMBINDEX, TROPONINI in the last 168 hours. BNP (last 3 results) No results for input(s): PROBNP in the last 8760 hours. HbA1C: Recent Labs    08/27/20 0101  HGBA1C 10.5*   CBG: Recent Labs  Lab 08/26/20 2115 08/27/20 0119 08/27/20 0608 08/27/20 0812 08/27/20 1122  GLUCAP 317* 227* 178* 207* 210*   Lipid Profile: Recent Labs    08/27/20 0101  CHOL 135  HDL 49  LDLCALC 74  TRIG 62  CHOLHDL 2.8   Thyroid Function Tests: Recent Labs    08/26/20 1529  TSH 2.356   Anemia Panel: No results for input(s): VITAMINB12, FOLATE, FERRITIN, TIBC, IRON, RETICCTPCT in the last 72 hours. Sepsis Labs: Recent Labs  Lab 08/26/20 2057 08/27/20 0101  LATICACIDVEN 1.9 1.7    Recent Results (from the past 240 hour(s))  Resp Panel by RT-PCR (Flu A&B, Covid) Nasopharyngeal Swab     Status: None   Collection Time: 08/26/20  4:49 PM   Specimen: Nasopharyngeal Swab; Nasopharyngeal(NP) swabs in vial transport medium  Result Value Ref Range Status   SARS Coronavirus 2 by RT PCR NEGATIVE NEGATIVE Final    Comment: (NOTE) SARS-CoV-2 target nucleic acids are NOT DETECTED.  The SARS-CoV-2 RNA is generally detectable in upper respiratory specimens during the acute phase of infection. The lowest concentration of SARS-CoV-2 viral copies this assay can detect is 138 copies/mL. A negative result does not preclude SARS-Cov-2 infection and should not be used as the sole basis for treatment or other patient management decisions. A negative result may occur with  improper specimen collection/handling, submission of specimen other than nasopharyngeal swab, presence of viral mutation(s) within the areas targeted by this assay, and  inadequate number of viral copies(<138 copies/mL). A negative result must be combined with clinical observations, patient history, and epidemiological information. The expected result is Negative.  Fact Sheet for Patients:  BloggerCourse.com  Fact Sheet for Healthcare Providers:  SeriousBroker.it  This test is no t yet approved or cleared by the [place] FDA and  has been authorized for detection and/or diagnosis of SARS-CoV-2 by FDA under an Emergency Use Authorization (EUA). This EUA will remain  in effect (meaning this test can be used) for the duration of the COVID-19 declaration under Section 564(b)(1) of the Act, 21 U.S.C.section 360bbb-3(b)(1), unless the authorization is terminated  or revoked sooner.       Influenza A by PCR NEGATIVE NEGATIVE Final   Influenza B by PCR NEGATIVE NEGATIVE Final    Comment: (NOTE) The Xpert Xpress SARS-CoV-2/FLU/RSV plus assay is intended as an aid in the diagnosis of influenza from Nasopharyngeal swab specimens and should not be used as a sole basis for treatment. Nasal  washings and aspirates are unacceptable for Xpert Xpress SARS-CoV-2/FLU/RSV testing.  Fact Sheet for Patients: BloggerCourse.com  Fact Sheet for Healthcare Providers: SeriousBroker.it  This test is not yet approved or cleared by the [place] FDA and has been authorized for detection and/or diagnosis of SARS-CoV-2 by FDA under an Emergency Use Authorization (EUA). This EUA will remain in effect (meaning this test can be used) for the duration of the COVID-19 declaration under Section 564(b)(1) of the Act, 21 U.S.C. section 360bbb-3(b)(1), unless the authorization is terminated or revoked.  Performed at [facility], [address], [place], [place] [address]      Radiology Studies: CT ABDOMEN PELVIS W CONTRAST  Result Date: 08/26/2020 CLINICAL  DATA:  Right lower quadrant pain for 4 days EXAM: CT ABDOMEN AND PELVIS WITH CONTRAST TECHNIQUE: Multidetector CT imaging of the abdomen and pelvis was performed using the standard protocol following bolus administration of intravenous contrast. CONTRAST:  OMNIPAQUE IOHEXOL 300 MG/ML  SOLN COMPARISON:  10/12/2015 FINDINGS: Lower chest: Hypoventilatory changes are seen at the lung bases. No acute pleural or parenchymal lung disease. Hepatobiliary: Stable subcentimeter cyst right lobe liver. No other focal liver abnormalities. No biliary dilation. The gallbladder is unremarkable. Pancreas: Unremarkable. No pancreatic ductal dilatation or surrounding inflammatory changes. Spleen: Normal in size without focal abnormality. Adrenals/Urinary Tract: Simple cyst upper pole left kidney. The kidneys enhance normally and symmetrically. No urinary tract calculi or obstructive uropathy. The adrenals and bladder are unremarkable. Stomach/Bowel: There is a dilated inflamed appendix measuring up to 17 mm, consistent with acute appendicitis. There is a multilocular periappendiceal abscess in the right lower quadrant measuring 5.0 x 5.2 x 5.0 cm. No bowel obstruction or ileus. Vascular/Lymphatic: Scattered atherosclerosis throughout the aorta. No pathologic adenopathy. Small mesenteric lymph nodes in the right lower quadrant are reactive. Reproductive: Prostate is unremarkable. Other: No free fluid or free gas. Mesenteric edema surrounding the appendicitis and periappendiceal abscess in the right lower quadrant. No abdominal wall hernia. Musculoskeletal: No acute or destructive bony lesions. Reconstructed images demonstrate no additional findings. IMPRESSION: 1. Acute appendicitis, with 5.0 x 5.2 x 5.0 cm periappendiceal abscess in the right lower quadrant. Critical Value/emergent results were called by telephone at the time of interpretation on 08/26/2020 at 4:44 pm to provider Willy Eddy , who verbally acknowledged these  results. Electronically Signed   By: Sharlet Salina M.D.   On: 08/26/2020 16:44   CT IMAGE GUIDED DRAINAGE BY PERCUTANEOUS CATHETER  Result Date: 08/27/2020 INDICATION: 75 year old male with periappendiceal abscess, referred for drainage EXAM: CT GUIDED DRAINAGE OF  ABSCESS MEDICATIONS: The patient is currently admitted to the hospital and receiving intravenous antibiotics. The antibiotics were administered within an appropriate time frame prior to the initiation of the procedure. ANESTHESIA/SEDATION: 2.0 mg IV Versed 100 mcg IV Fentanyl Moderate Sedation Time:  42 minutes The patient was continuously monitored during the procedure by the interventional radiology nurse under my direct supervision. COMPLICATIONS: None TECHNIQUE: Informed written consent was obtained from the patient after a thorough discussion of the procedural risks, benefits and alternatives. All questions were addressed. Maximal Sterile Barrier Technique was utilized including caps, mask, sterile gowns, sterile gloves, sterile drape, hand hygiene and skin antiseptic. A timeout was performed prior to the initiation of the procedure. PROCEDURE: Patient was positioned supine position on the CT gantry table. Scout CT of the pelvis was performed for planning purposes. The right lower quadrant was prepped with chlorhexidine in a sterile fashion, and a sterile drape was applied covering the  operative field. A sterile gown and sterile gloves were used for the procedure. Local anesthesia was provided with 1% Lidocaine. Once the patient is prepped and draped in the usual sterile fashion, 1% lidocaine was used for local anesthesia. We started with 10 cm 18 gauge trocar needle, advanced into the phlegmonous changes of the periappendiceal region. Once we confirmed needle tip position, modified Seldinger technique was used in attempt to place a 12 [place] drain. Given the phlegmonous change and poorly formed abscess, the drain was pushed through the dense  phlegmonous changes into the mesenteric fat on the posterior aspect. This 12 French drain could not be reformed with sequential steps into the phlegmon and the 12 [place] drain was removed. The 10 cm 18 gauge trocar was again placed into the phlegmon through the skin, confirmed with CT. A new Amplatz wire was placed and we attempted to form an 8.5 [place] standard drainage catheter into the phlegmon. The catheter again was forming on the far side of the phlegmon, given the inability to form the loop within the phlegmon/dense changes in the periappendiceal region. Several steps were attempted to reform the loop within the phlegmon which failed. We withdrew the catheter and then attempted 1 final placement. The needle was placed into the phlegmon confirm with CT guidance. Modified Seldinger technique was then successful in placing the 8 French drain into the phlegmon. We confirmed the catheter within the phlegmon and the catheter was sutured in position and attached to bulb suction. Sample sent for culture. No significant blood loss.  No complications encountered. FINDINGS: Final image demonstrates the catheter formed within the phlegmon. IMPRESSION: Status post image guided placement of 8.5 French pigtail drainage catheter within right lower quadrant periappendiceal phlegmon. Signed, Yvone Neu. Reyne Dumas, RPVI [facility] Radiology Electronically Signed   By: Gilmer Mor D.O.   On: 08/27/2020 16:21    Scheduled Meds: . atorvastatin  10 mg Oral QHS  . fentaNYL      . insulin aspart  0-15 Units Subcutaneous Q4H  . insulin glargine  30 Units Subcutaneous QHS  . losartan  100 mg Oral Daily  . metoprolol tartrate  25 mg Oral BID  . midazolam      . nicotine  7 mg Transdermal Daily  . ondansetron (ZOFRAN) IV  4 mg Intravenous Once  . pantoprazole (PROTONIX) IV  40 mg Intravenous QHS  . pregabalin  150 mg Oral QHS  . sodium chloride flush  5 mL Intracatheter Q8H   . tiZANidine  2 mg Oral QHS   Continuous Infusions: . lactated ringers 100 mL/hr at 08/27/20 0918  . lactated ringers 500 mL/hr at 08/27/20 1635  . piperacillin-tazobactam (ZOSYN)  IV 3.375 g (08/27/20 0919)     LOS: 1 day   Time spent: 30 minutes. More than 50% of the time was spent in counseling/coordination of care  Arnetha Courser, MD [facility]  If 7PM-7AM, please contact night-coverage [email or web address]  08/27/2020, 5:19 PM   This record has been created using Conservation officer, historic buildings. Errors have been sought and corrected,but may not always be located. Such creation errors do not reflect on the standard of care.

## 2020-08-27 NOTE — Progress Notes (Signed)
Inpatient Diabetes Program Recommendations  AACE/ADA: New Consensus Statement on Inpatient Glycemic Control (2015)  Target Ranges:  Prepandial:   less than 140 mg/dL      Peak postprandial:   less than 180 mg/dL (1-2 hours)      Critically ill patients:  140 - 180 mg/dL   Results for JAMARL, PEW (MRN [id number]) as of 08/27/2020 07:15  Ref. Range 08/26/2020 15:29  Glucose Latest Ref Range: 70 - 99 mg/dL 376 (H)   Results for LUKIS, BUNT (MRN [id number]) as of 08/27/2020 07:15  Ref. Range 08/26/2020 21:15 08/27/2020 01:19 08/27/2020 06:08  Glucose-Capillary Latest Ref Range: 70 - 99 mg/dL 317 (H)  7 units NOVOLOG _0 :56pm  25 units LANTUS _1 :57pm 227 (H)  3 units NOVOLOG _2 :03am 178 (H)   Results for CURTEZ, BRALLIER (MRN [id number]) as of 08/27/2020 07:15  Ref. Range 06/30/2020 00:00 08/27/2020 01:01  Hemoglobin A1C Latest Ref Range: 4.8 - 5.6 % 10.3 10.5 (H)  (254 mg/dl)    Admit with: Perforated appendicitis with abscess  History: DM  Home DM Meds: Humalog 75/25 Insulin 30 units AM/ 25 units PM  Current Orders: Lantus 30 units QHS      Novolog Moderate Correction Scale/ SSI (0-15 units) Q4 hours    Met w/ pt and his daughter at bedside this AM.  Pt told me he only checks his CBGs once every other day at home.  Has been seeing numbers in the 200 range for several weeks.  Takes his insulin regularly and does not miss doses--takes at 10am with breakfast and 6pm with dinner.  No issues with taking insulin, just does not like checking fingerstick CBGs.  Sees Dr. Ancil Boozer with [facility] for regular medical care.  Last visit was 07/16/2020--looks like his A1c was 10.3% from that time frame.  No changes made to his insulin regimen at that visit.    We discussed his current A1c of 10.5% (unchaged from March).  Discussed the impact of his current appendicitis and abscess and how that could have affected his A1c but that his A1c was likely above goal prior to having appendicitis.  We reviewed  goal A1c for home.  I strongly encouraged pt to check his CBGs BID at home (prior to taking insulin dose).  Also encouraged pt to check before lunch once in a while.  If pt will only agree to Once daily checking, please check in the AM before AM dose of insulin and in the PM before the PM dose of insulin and alternate form day to day.  We also discussed the importance of good CBG control after discharge to help heal his infection and to prevent further infections and of course long-term complications.  Pt's daughter told me she plans ot have pt live with her for 1 month after discharge to help him recuperate.  Discussed w/ pt and daughter that we currently have pt on Lantus and Novolog instead of his normal Humalog 75/25 insulin--explained what each insulin is, how it works, and why we switched temporarily while in hospital.  Plan will be to resume 75/25 insulin BID at time of d/c home.  Pt agreeable and appreciative of visit.    --Will follow patient during hospitalization--  Wyn Quaker RN, MSN, CDE Diabetes Coordinator Inpatient Glycemic Control Team Team Pager: [phone] (8a-5p)

## 2020-08-27 NOTE — Progress Notes (Signed)
   08/27/20 1735  Assess: MEWS Score  Temp (!) (S)  102.1 F (38.9 C)  BP (!) 151/63  Pulse Rate (!) 115  Resp 20  SpO2 94 %  O2 Device Room Air  Assess: MEWS Score  MEWS Temp 2  MEWS Systolic 0  MEWS Pulse 2  MEWS RR 0  MEWS LOC 0  MEWS Score 4  MEWS Score Color Red  Assess: if the MEWS score is Yellow or Red  Were vital signs taken at a resting state? Yes  Focused Assessment Change from prior assessment (see assessment flowsheet)  Early Detection of Sepsis Score *See Row Information* High  MEWS guidelines implemented *See Row Information* Yes  Treat  MEWS Interventions Escalated (See documentation below)  Pain Scale 0-10  Pain Score 0  Take Vital Signs  Increase Vital Sign Frequency  Red: Q 1hr X 4 then Q 4hr X 4, if remains red, continue Q 4hrs (Simultaneous filing. User may not have seen previous data.)  Escalate  MEWS: Escalate Red: discuss with charge nurse/RN and provider, consider discussing with RRT  Notify: Charge Nurse/RN  Name of Charge Nurse/RN Notified Rosey Bath RN  Date Charge Nurse/RN Notified 08/27/20  Time Charge Nurse/RN Notified 1735  Notify: Provider  Provider Name/Title Henrene Dodge  Date Provider Notified 08/27/20  Time Provider Notified 1740  Notification Type Call  Notification Reason Other (Comment) (T102.1 HR 115)  Provider response See new orders  Date of Provider Response 08/27/20  Time of Provider Response 1740  Notify: Rapid Response  Name of Rapid Response RN Notified Dillon RN  Date Rapid Response Notified 08/27/20  Time Rapid Response Notified 1738  Document  Patient Outcome Not stable and remains on department  Progress note created (see row info) Yes

## 2020-08-28 DIAGNOSIS — E785 Hyperlipidemia, unspecified: Secondary | ICD-10-CM | POA: Diagnosis not present

## 2020-08-28 DIAGNOSIS — R509 Fever, unspecified: Secondary | ICD-10-CM

## 2020-08-28 DIAGNOSIS — R000 Tachycardia, unspecified: Secondary | ICD-10-CM

## 2020-08-28 DIAGNOSIS — E1165 Type 2 diabetes mellitus with hyperglycemia: Secondary | ICD-10-CM | POA: Diagnosis not present

## 2020-08-28 DIAGNOSIS — I10 Essential (primary) hypertension: Secondary | ICD-10-CM | POA: Diagnosis not present

## 2020-08-28 DIAGNOSIS — E119 Type 2 diabetes mellitus without complications: Secondary | ICD-10-CM | POA: Diagnosis not present

## 2020-08-28 DIAGNOSIS — K3533 Acute appendicitis with perforation and localized peritonitis, with abscess: Secondary | ICD-10-CM | POA: Diagnosis not present

## 2020-08-28 LAB — BASIC METABOLIC PANEL
Anion gap: 8 (ref 5–15)
BUN: 23 mg/dL (ref 8–23)
CO2: 26 mmol/L (ref 22–32)
Calcium: 8.3 mg/dL — ABNORMAL LOW (ref 8.9–10.3)
Chloride: 102 mmol/L (ref 98–111)
Creatinine, Ser: 1.26 mg/dL — ABNORMAL HIGH (ref 0.61–1.24)
GFR, Estimated: 59 mL/min — ABNORMAL LOW (ref >60)
Glucose, Bld: 226 mg/dL — ABNORMAL HIGH (ref 70–99)
Potassium: 3.4 mmol/L — ABNORMAL LOW (ref 3.5–5.1)
Sodium: 136 mmol/L (ref 135–145)

## 2020-08-28 LAB — CBC
HCT: 37.7 % — ABNORMAL LOW (ref 39.0–52.0)
Hemoglobin: 12.8 g/dL — ABNORMAL LOW (ref 13.0–17.0)
MCH: 28.7 pg (ref 26.0–34.0)
MCHC: 34 g/dL (ref 30.0–36.0)
MCV: 84.5 fL (ref 80.0–100.0)
Platelets: 195 10*3/uL (ref 150–400)
RBC: 4.46 MIL/uL (ref 4.22–5.81)
RDW: 13.5 % (ref 11.5–15.5)
WBC: 10.4 10*3/uL (ref 4.0–10.5)
nRBC: 0 % (ref 0.0–0.2)

## 2020-08-28 LAB — GLUCOSE, CAPILLARY
Glucose-Capillary: 111 mg/dL — ABNORMAL HIGH (ref 70–99)
Glucose-Capillary: 116 mg/dL — ABNORMAL HIGH (ref 70–99)
Glucose-Capillary: 135 mg/dL — ABNORMAL HIGH (ref 70–99)
Glucose-Capillary: 148 mg/dL — ABNORMAL HIGH (ref 70–99)
Glucose-Capillary: 168 mg/dL — ABNORMAL HIGH (ref 70–99)
Glucose-Capillary: 225 mg/dL — ABNORMAL HIGH (ref 70–99)
Glucose-Capillary: 245 mg/dL — ABNORMAL HIGH (ref 70–99)

## 2020-08-28 MED ORDER — SODIUM CHLORIDE 0.9 % IV SOLN: INTRAVENOUS | Status: DC

## 2020-08-28 NOTE — Progress Notes (Signed)
[facility] SURGICAL PROGRESS NOTE (cpt [phone])  Hospital Day(s): 2.   Interval History: Overnight, patient did have rapid response called around 1800 secondary to red MEWS as he was febrile to 102F and tachycardic. This was treated with antipyretics and IVF, and seemed to improve. Suspect he may have had transient worsening following drain placement. Question transient bacteremia from drain placement but BCx with NGTD (<12 hours).   Patient seen and examined this morning. Patient reports he is feeling better compared to last night but still having RLQ soreness. His leukocytosis remains resolved this morning at 10.4K. He does have a slight bump in sCr - 1.26; suspect secondary to under resuscitation. Mild hypokalemia to 3.4. He did have percutaneous drain placement yesterday (05/04) with IR. Output has been 10 ccs; serosanguinous. Cx from placement growing gram positive cocci and rods. He continues on Zosyn.   Review of Systems:  Constitutional: + fever (overnight), denied chills HEENT: denies cough or congestion  Respiratory: denies any shortness of breath  Cardiovascular: denies chest pain or palpitations  Gastrointestinal: + abdominal pain, denied N/V, or diarrhea/and bowel function as per interval history Genitourinary: denies burning with urination or urinary frequency   Vital signs in last 24 hours: [min-max] current  Temp:  [98.2 F (36.8 C)-102.8 F (39.3 C)] 99.3 F (37.4 C) (05/05 0448) Pulse Rate:  [78-115] 87 (05/05 0448) Resp:  [12-27] 16 (05/05 0448) BP: (92-175)/(52-99) 108/55 (05/05 0448) SpO2:  [90 %-98 %] 95 % (05/05 0448)     Height: 5\' 8"  (172.7 cm) Weight: 81.6 kg BMI (Calculated): 27.36   Intake/Output last 2 shifts:  05/04 0701 - 05/05 0700 In: -  Out: 10 [Drains:10]   Physical Exam:  Constitutional: alert, cooperative and no distress  HENT: normocephalic without obvious abnormality  Eyes: PERRL, EOM's grossly intact and symmetric   Respiratory: breathing non-labored at rest  Cardiovascular: regular rate and sinus rhythm  Gastrointestinal: Soft, RLQ soreness, and non-distended, no rebound/guarding. Drain in the RLQ with serosanguinous output Musculoskeletal: no edema or wounds, motor and sensation grossly intact, NT    Labs:  CBC Latest Ref Rng & Units 08/28/2020 08/27/2020 08/27/2020  WBC 4.0 - 10.5 K/uL 10.4 10.1 13.2(H)  Hemoglobin 13.0 - 17.0 g/dL 12.8(L) 14.2 14.2  Hematocrit 39.0 - 52.0 % 37.7(L) 41.5 40.7  Platelets 150 - 400 K/uL 195 208 214   CMP Latest Ref Rng & Units 08/28/2020 08/27/2020 08/27/2020  Glucose 70 - 99 mg/dL 10/27/2020) 614(E) 315(Q)  BUN 8 - 23 mg/dL 23 19 19   Creatinine 0.61 - 1.24 mg/dL 008(Q) 7.61(P  Sodium 135 - 145 mmol/L 136 136 137  Potassium 3.5 - 5.1 mmol/L 3.4(L) 3.8 4.0  Chloride 98 - 111 mmol/L 102 100 100  CO2 22 - 32 mmol/L 26 24 26   Calcium 8.9 - 10.3 mg/dL 8.3(L) 8.8(L) 8.8(L)  Total Protein 6.5 - 8.1 g/dL - - 7.6  Total Bilirubin 0.3 - 1.2 mg/dL - - 1.2  Alkaline Phos 38 - 126 U/L - - 76  AST 15 - 41 U/L - - 20  ALT 0 - 44 U/L - - 27     Imaging studies: No new pertinent imaging studies   Assessment/Plan: (ICD-10's: K35.80) 75 y.o. male with episode of fever/tachycardia overnight otherwise with resolution in leukocytosis admitted with ruptured appendicitis with abscess s/p drain placement on 05/04, complicated by pertinent comorbidities including HTN, HLD, DM2.              - Will trial  CLD for now; hold off on further advancement             - Continue IVF support; switch to NS             - Continue IV Abx (Zosyn); Day 2  - Continue percutaneous drain; monitor and record output; flush daily with 5-10 ccs NS  - No emergent surgical intervention; will follow up outpatient for interval appendectomy in ~8 weeks             - Monitor abdominal examination             - Monitor leucocytosis; resolved             - Pain control prn; antiemetics prn              -  Mobilization as tolerated              - Appreciate medicine assistance with comorbid conditions    All of the above findings and recommendations were discussed with the patient, and the medical team, and all of patient's questions were answered to his expressed satisfaction.  -- Lynden Oxford, PA-C [facility] 08/28/2020, 7:09 AM [phone] M-F: 7am - 4pm

## 2020-08-28 NOTE — Progress Notes (Signed)
PROGRESS NOTE/consult    Wilder Kurowski  KGM:[id number] DOB: [birth year] DOA: 08/26/2020 PCP: Alba Cory, MD (Confirm with patient/family/NH records and if not entered, this HAS to be entered at [facility] point of entry. "No PCP" if truly none.)   Chief Complaint  Patient presents with  . Abdominal Pain    Brief Narrative:  75 yo M DM2, HTN, HLD, neuropothy came in with Right Lq pain 5 days no nausea voting or fever, not taking his insulin or BP meds for the past 3 days. He was too weak to even get to get his medicine.  Found to have perforated appendix, admitted under surgery service and we were consulted for diabetes management. Patient s/p percutaneous drain placement with IR.   Assessment & Plan:   Active Problems:   Hypertension   Dyslipidemia   Ruptured appendicitis   Acute appendicitis with localized peritonitis and abscess   DM2 (diabetes mellitus, type 2) (HCC)   DM (diabetes mellitus), type 2, uncontrolled (HCC)   #1 perforated appendix -Patient noted to have a localized peritonitis and abscess formation per CT abdomen and pelvis scan. -Status post percutaneous drain placement per IR. -On IV Zosyn. -IV fluids -Per primary team/surgical team. -Diet per general surgery.  2.  Uncontrolled type 2 diabetes mellitus -Hemoglobin A1c 10.5 (08/27/2020) -CBG 225>> 148 this morning -Continue current Lantus dose of 30 units daily, SSI. -Consult with diabetic coordinator for diabetes education.  3.  Diabetic neuropathy -Lyrica  4.  Hypertension -Blood pressure noted to be borderline this morning. -Discontinue Cozaar. -Continue Lopressor. -Follow.  5.  Hyperlipidemia -Statin.  6.  Tobacco abuse -Tobacco cessation stressed to patient.  Nicotine patch.    DVT prophylaxis: SCDs Code Status: Full Family Communication: Updated patient.  No family at bedside. Disposition:   Status is: Inpatient    Dispo: The patient is from: Home              Anticipated d/c is  to: Likely home              Patient currently status post drain placement for acute appendicitis, on IV antibiotics, not stable for discharge   Difficult to place patient No       Consultants:   [facility] hospitalist: Dr. Adela Glimpse 08/26/2020    Procedures:   CT abdomen and pelvis 08/26/2020  Status post CT-guided placement of 8.5 French pigtail drainage catheter into right lower quadrant periappendiceal phlegmon per Dr. Loreta Ave IR 08/27/2020  Antimicrobials:   IV Zosyn 08/26/2020>>>>>   Subjective: Laying in bed.  Some complaints of soreness around right lower quadrant around drain site.  States right lower quadrant abdominal pain has improved since admission after drain placement.  No chest pain.  No shortness of breath.  Patient noted to have had fever of 102, tachycardia yesterday evening treated with antipyretics and IV fluids with clinical improvement.  Objective: Vitals:   08/27/20 2302 08/28/20 0009 08/28/20 0448 08/28/20 0945  BP: (!) 92/52 (!) 95/52 (!) 108/55 128/74  Pulse: 86 87 87 98  Resp: 17 17 16 16   Temp: 98.3 F (36.8 C) 99.2 F (37.3 C) 99.3 F (37.4 C) 99.9 F (37.7 C)  TempSrc: Oral Oral Oral Oral  SpO2: 95% 95% 95% 92%  Weight:      Height:        Intake/Output Summary (Last 24 hours) at 08/28/2020 1110 Last data filed at 08/28/2020 0500 Gross per 24 hour  Intake --  Output 10 ml  Net -10 ml  Filed Weights   08/26/20 1933 08/26/20 2048  Weight: 86.2 kg 81.6 kg    Examination:  General exam: Appears calm and comfortable  Respiratory system: Clear to auscultation. Respiratory effort normal. Cardiovascular system: S1 & S2 heard, RRR. No JVD, murmurs, rubs, gallops or clicks. No pedal edema. Gastrointestinal system: Abdomen is nondistended, soft. No organomegaly or masses felt. Normal bowel sounds heard.  Tenderness to palpation right lower quadrant around drain site.  Drain with sanguinous drainage noted. Central nervous system: Alert and oriented. No  focal neurological deficits. Extremities: Symmetric 5 x 5 power. Skin: No rashes, lesions or ulcers Psychiatry: Judgement and insight appear normal. Mood & affect appropriate.     Data Reviewed: I have personally reviewed following labs and imaging studies  CBC: Recent Labs  Lab 08/26/20 1529 08/27/20 0101 08/27/20 1826 08/28/20 0550  WBC 12.5* 13.2* 10.1 10.4  NEUTROABS  --  11.2*  --   --   HGB 14.3 14.2 14.2 12.8*  HCT 43.0 40.7 41.5 37.7*  MCV 85.7 84.4 85.2 84.5  PLT 228 214 208 195    Basic Metabolic Panel: Recent Labs  Lab 08/26/20 1529 08/27/20 0101 08/27/20 1826 08/28/20 0550  NA 132* 137 136 136  K 4.2 4.0 3.8 3.4*  CL 95* 100 100 102  CO2 25 26 24 26   GLUCOSE 376* 263* 189* 226*  BUN 20 19 19 23   CREATININE 1.14 1.12 1.07 1.26*  CALCIUM 9.0 8.8* 8.8* 8.3*  MG  --  1.8  --   --     GFR: Estimated Creatinine Clearance: 49 mL/min (A) (by C-G formula based on SCr of 1.26 mg/dL (H)).  Liver Function Tests: Recent Labs  Lab 08/26/20 1529 08/27/20 0101  AST 21 20  ALT 28 27  ALKPHOS 78 76  BILITOT 1.2 1.2  PROT 8.0 7.6  ALBUMIN 3.3* 2.9*    CBG: Recent Labs  Lab 08/27/20 1736 08/27/20 2101 08/28/20 0011 08/28/20 0452 08/28/20 0801  GLUCAP 182* 153* 245* 225* 148*     Recent Results (from the past 240 hour(s))  Resp Panel by RT-PCR (Flu A&B, Covid) Nasopharyngeal Swab     Status: None   Collection Time: 08/26/20  4:49 PM   Specimen: Nasopharyngeal Swab; Nasopharyngeal(NP) swabs in vial transport medium  Result Value Ref Range Status   SARS Coronavirus 2 by RT PCR NEGATIVE NEGATIVE Final    Comment: (NOTE) SARS-CoV-2 target nucleic acids are NOT DETECTED.  The SARS-CoV-2 RNA is generally detectable in upper respiratory specimens during the acute phase of infection. The lowest concentration of SARS-CoV-2 viral copies this assay can detect is 138 copies/mL. A negative result does not preclude SARS-Cov-2 infection and should not be  used as the sole basis for treatment or other patient management decisions. A negative result may occur with  improper specimen collection/handling, submission of specimen other than nasopharyngeal swab, presence of viral mutation(s) within the areas targeted by this assay, and inadequate number of viral copies(<138 copies/mL). A negative result must be combined with clinical observations, patient history, and epidemiological information. The expected result is Negative.  Fact Sheet for Patients:  BloggerCourse.comhttps://www.fda.gov/media/152166/download  Fact Sheet for Healthcare Providers:  SeriousBroker.ithttps://www.fda.gov/media/152162/download  This test is no t yet approved or cleared by the [place] FDA and  has been authorized for detection and/or diagnosis of SARS-CoV-2 by FDA under an Emergency Use Authorization (EUA). This EUA will remain  in effect (meaning this test can be used) for the duration of the COVID-19 declaration under  Section 564(b)(1) of the Act, 21 U.S.C.section 360bbb-3(b)(1), unless the authorization is terminated  or revoked sooner.       Influenza A by PCR NEGATIVE NEGATIVE Final   Influenza B by PCR NEGATIVE NEGATIVE Final    Comment: (NOTE) The Xpert Xpress SARS-CoV-2/FLU/RSV plus assay is intended as an aid in the diagnosis of influenza from Nasopharyngeal swab specimens and should not be used as a sole basis for treatment. Nasal washings and aspirates are unacceptable for Xpert Xpress SARS-CoV-2/FLU/RSV testing.  Fact Sheet for Patients: BloggerCourse.com  Fact Sheet for Healthcare Providers: SeriousBroker.it  This test is not yet approved or cleared by the [place] FDA and has been authorized for detection and/or diagnosis of SARS-CoV-2 by FDA under an Emergency Use Authorization (EUA). This EUA will remain in effect (meaning this test can be used) for the duration of the COVID-19 declaration under Section  564(b)(1) of the Act, 21 U.S.C. section 360bbb-3(b)(1), unless the authorization is terminated or revoked.  Performed at [facility], [address] [place], [place] [address]   Aerobic/Anaerobic Culture (surgical/deep wound)     Status: None (Preliminary result)   Collection Time: 08/27/20  3:54 PM   Specimen: Abdomen; Abscess  Result Value Ref Range Status   Specimen Description   Final    ABDOMEN Performed at [facility], [address] [place], [place] [address]    Special Requests   Final    NONE Performed at [facility], [address] [place], [place] [address]    Gram Stain   Final    MODERATE WBC PRESENT, PREDOMINANTLY PMN ABUNDANT GRAM POSITIVE COCCI ABUNDANT GRAM POSITIVE RODS    Culture   Final    CULTURE REINCUBATED FOR BETTER GROWTH Performed at [facility], [address] [place], [place] [address]    Report Status PENDING  Incomplete  Culture, blood (routine x 2)     Status: None (Preliminary result)   Collection Time: 08/27/20  6:10 PM   Specimen: BLOOD  Result Value Ref Range Status   Specimen Description BLOOD BLOOD LEFT HAND  Final   Special Requests   Final    BOTTLES DRAWN AEROBIC AND ANAEROBIC Blood Culture adequate volume   Culture   Final    NO GROWTH < 12 HOURS Performed at [facility], [address], [place], [place] [address]    Report Status PENDING  Incomplete  Culture, blood (routine x 2)     Status: None (Preliminary result)   Collection Time: 08/27/20  6:26 PM   Specimen: BLOOD  Result Value Ref Range Status   Specimen Description BLOOD LEFT ANTECUBITAL  Final   Special Requests   Final    BOTTLES DRAWN AEROBIC AND ANAEROBIC Blood Culture adequate volume   Culture   Final    NO GROWTH < 12 HOURS Performed at [facility], [address], [place], [place] [address]    Report Status PENDING  Incomplete         Radiology Studies: CT ABDOMEN PELVIS W  CONTRAST  Result Date: 08/26/2020 CLINICAL DATA:  Right lower quadrant pain for 4 days EXAM: CT ABDOMEN AND PELVIS WITH CONTRAST TECHNIQUE: Multidetector CT imaging of the abdomen and pelvis was performed using the standard protocol following bolus administration of intravenous contrast. CONTRAST:  OMNIPAQUE IOHEXOL 300 MG/ML  SOLN COMPARISON:  10/12/2015 FINDINGS: Lower chest: Hypoventilatory changes are seen at the lung bases. No acute pleural or parenchymal lung disease. Hepatobiliary: Stable  subcentimeter cyst right lobe liver. No other focal liver abnormalities. No biliary dilation. The gallbladder is unremarkable. Pancreas: Unremarkable. No pancreatic ductal dilatation or surrounding inflammatory changes. Spleen: Normal in size without focal abnormality. Adrenals/Urinary Tract: Simple cyst upper pole left kidney. The kidneys enhance normally and symmetrically. No urinary tract calculi or obstructive uropathy. The adrenals and bladder are unremarkable. Stomach/Bowel: There is a dilated inflamed appendix measuring up to 17 mm, consistent with acute appendicitis. There is a multilocular periappendiceal abscess in the right lower quadrant measuring 5.0 x 5.2 x 5.0 cm. No bowel obstruction or ileus. Vascular/Lymphatic: Scattered atherosclerosis throughout the aorta. No pathologic adenopathy. Small mesenteric lymph nodes in the right lower quadrant are reactive. Reproductive: Prostate is unremarkable. Other: No free fluid or free gas. Mesenteric edema surrounding the appendicitis and periappendiceal abscess in the right lower quadrant. No abdominal wall hernia. Musculoskeletal: No acute or destructive bony lesions. Reconstructed images demonstrate no additional findings. IMPRESSION: 1. Acute appendicitis, with 5.0 x 5.2 x 5.0 cm periappendiceal abscess in the right lower quadrant. Critical Value/emergent results were called by telephone at the time of interpretation on 08/26/2020 at 4:44 pm to provider Willy Eddy , who verbally acknowledged these results. Electronically Signed   By: Sharlet Salina M.D.   On: 08/26/2020 16:44   CT IMAGE GUIDED DRAINAGE BY PERCUTANEOUS CATHETER  Result Date: 08/27/2020 INDICATION: 75 year old male with periappendiceal abscess, referred for drainage EXAM: CT GUIDED DRAINAGE OF  ABSCESS MEDICATIONS: The patient is currently admitted to the hospital and receiving intravenous antibiotics. The antibiotics were administered within an appropriate time frame prior to the initiation of the procedure. ANESTHESIA/SEDATION: 2.0 mg IV Versed 100 mcg IV Fentanyl Moderate Sedation Time:  42 minutes The patient was continuously monitored during the procedure by the interventional radiology nurse under my direct supervision. COMPLICATIONS: None TECHNIQUE: Informed written consent was obtained from the patient after a thorough discussion of the procedural risks, benefits and alternatives. All questions were addressed. Maximal Sterile Barrier Technique was utilized including caps, mask, sterile gowns, sterile gloves, sterile drape, hand hygiene and skin antiseptic. A timeout was performed prior to the initiation of the procedure. PROCEDURE: Patient was positioned supine position on the CT gantry table. Scout CT of the pelvis was performed for planning purposes. The right lower quadrant was prepped with chlorhexidine in a sterile fashion, and a sterile drape was applied covering the operative field. A sterile gown and sterile gloves were used for the procedure. Local anesthesia was provided with 1% Lidocaine. Once the patient is prepped and draped in the usual sterile fashion, 1% lidocaine was used for local anesthesia. We started with 10 cm 18 gauge trocar needle, advanced into the phlegmonous changes of the periappendiceal region. Once we confirmed needle tip position, modified Seldinger technique was used in attempt to place a 12 [place] drain. Given the phlegmonous change and poorly formed abscess,  the drain was pushed through the dense phlegmonous changes into the mesenteric fat on the posterior aspect. This 21 French drain could not be reformed with sequential steps into the phlegmon and the 12 [place] drain was removed. The 10 cm 18 gauge trocar was again placed into the phlegmon through the skin, confirmed with CT. A new Amplatz wire was placed and we attempted to form an 8.5 [place] standard drainage catheter into the phlegmon. The catheter again was forming on the far side of the phlegmon, given the inability to form the loop within the phlegmon/dense changes in the periappendiceal region. Several steps were  attempted to reform the loop within the phlegmon which failed. We withdrew the catheter and then attempted 1 final placement. The needle was placed into the phlegmon confirm with CT guidance. Modified Seldinger technique was then successful in placing the 8 French drain into the phlegmon. We confirmed the catheter within the phlegmon and the catheter was sutured in position and attached to bulb suction. Sample sent for culture. No significant blood loss.  No complications encountered. FINDINGS: Final image demonstrates the catheter formed within the phlegmon. IMPRESSION: Status post image guided placement of 8.5 French pigtail drainage catheter within right lower quadrant periappendiceal phlegmon. Signed, Yvone Neu. Reyne Dumas, RPVI [facility] Electronically Signed   By: Gilmer Mor D.O.   On: 08/27/2020 16:21        Scheduled Meds: . atorvastatin  10 mg Oral QHS  . insulin aspart  0-15 Units Subcutaneous Q4H  . insulin glargine  30 Units Subcutaneous QHS  . losartan  100 mg Oral Daily  . metoprolol tartrate  25 mg Oral BID  . nicotine  7 mg Transdermal Daily  . ondansetron (ZOFRAN) IV  4 mg Intravenous Once  . pantoprazole (PROTONIX) IV  40 mg Intravenous QHS  . pregabalin  150 mg Oral QHS  . sodium chloride flush  5 mL  Intracatheter Q8H  . tiZANidine  2 mg Oral QHS   Continuous Infusions: . sodium chloride 100 mL/hr at 08/28/20 0947  . piperacillin-tazobactam (ZOSYN)  IV 3.375 g (08/28/20 0949)     LOS: 2 days    Time spent: 35 minutes    Ramiro Harvest, MD [facility]   To contact the attending provider between 7A-7P or the covering provider during after hours 7P-7A, please log into the web site [email or web address] and access using universal [place] password for that web site. If you do not have the password, please call the hospital operator.  08/28/2020, 11:10 AM

## 2020-08-28 NOTE — Plan of Care (Signed)

## 2020-08-29 ENCOUNTER — Inpatient Hospital Stay: Payer: Medicare Other

## 2020-08-29 ENCOUNTER — Encounter: Payer: Self-pay | Admitting: Surgery

## 2020-08-29 DIAGNOSIS — I10 Essential (primary) hypertension: Secondary | ICD-10-CM | POA: Diagnosis not present

## 2020-08-29 DIAGNOSIS — K3533 Acute appendicitis with perforation and localized peritonitis, with abscess: Secondary | ICD-10-CM | POA: Diagnosis not present

## 2020-08-29 DIAGNOSIS — E785 Hyperlipidemia, unspecified: Secondary | ICD-10-CM | POA: Diagnosis not present

## 2020-08-29 DIAGNOSIS — E1165 Type 2 diabetes mellitus with hyperglycemia: Secondary | ICD-10-CM | POA: Diagnosis not present

## 2020-08-29 DIAGNOSIS — E119 Type 2 diabetes mellitus without complications: Secondary | ICD-10-CM | POA: Diagnosis not present

## 2020-08-29 LAB — CBC WITH DIFFERENTIAL/PLATELET
Abs Immature Granulocytes: 0.08 10*3/uL — ABNORMAL HIGH (ref 0.00–0.07)
Basophils Absolute: 0 10*3/uL (ref 0.0–0.1)
Basophils Relative: 0 %
Eosinophils Absolute: 0.1 10*3/uL (ref 0.0–0.5)
Eosinophils Relative: 1 %
HCT: 36.7 % — ABNORMAL LOW (ref 39.0–52.0)
Hemoglobin: 12.9 g/dL — ABNORMAL LOW (ref 13.0–17.0)
Immature Granulocytes: 1 %
Lymphocytes Relative: 10 %
Lymphs Abs: 1.3 10*3/uL (ref 0.7–4.0)
MCH: 29.6 pg (ref 26.0–34.0)
MCHC: 35.1 g/dL (ref 30.0–36.0)
MCV: 84.2 fL (ref 80.0–100.0)
Monocytes Absolute: 1 10*3/uL (ref 0.1–1.0)
Monocytes Relative: 8 %
Neutro Abs: 9.9 10*3/uL — ABNORMAL HIGH (ref 1.7–7.7)
Neutrophils Relative %: 80 %
Platelets: 207 10*3/uL (ref 150–400)
RBC: 4.36 MIL/uL (ref 4.22–5.81)
RDW: 13.4 % (ref 11.5–15.5)
WBC: 12.5 10*3/uL — ABNORMAL HIGH (ref 4.0–10.5)
nRBC: 0 % (ref 0.0–0.2)

## 2020-08-29 LAB — BASIC METABOLIC PANEL
Anion gap: 10 (ref 5–15)
BUN: 13 mg/dL (ref 8–23)
CO2: 25 mmol/L (ref 22–32)
Calcium: 8 mg/dL — ABNORMAL LOW (ref 8.9–10.3)
Chloride: 103 mmol/L (ref 98–111)
Creatinine, Ser: 1.08 mg/dL (ref 0.61–1.24)
GFR, Estimated: >60 mL/min (ref >60)
Glucose, Bld: 92 mg/dL (ref 70–99)
Potassium: 3.2 mmol/L — ABNORMAL LOW (ref 3.5–5.1)
Sodium: 138 mmol/L (ref 135–145)

## 2020-08-29 LAB — GLUCOSE, CAPILLARY
Glucose-Capillary: 99 mg/dL (ref 70–99)
Glucose-Capillary: 110 mg/dL — ABNORMAL HIGH (ref 70–99)
Glucose-Capillary: 120 mg/dL — ABNORMAL HIGH (ref 70–99)
Glucose-Capillary: 127 mg/dL — ABNORMAL HIGH (ref 70–99)
Glucose-Capillary: 88 mg/dL (ref 70–99)

## 2020-08-29 LAB — MAGNESIUM: Magnesium: 1.7 mg/dL (ref 1.7–2.4)

## 2020-08-29 MED ORDER — INSULIN ASPART 100 UNIT/ML IJ SOLN
0.0000 [IU] | Freq: Three times a day (TID) | INTRAMUSCULAR | Status: DC
  Administered 2020-08-29 – 2020-09-01 (×3): 2 [IU] via SUBCUTANEOUS
  Administered 2020-09-01: 3 [IU] via SUBCUTANEOUS
  Administered 2020-09-02: 8 [IU] via SUBCUTANEOUS
  Filled 2020-08-29 (×5): qty 1

## 2020-08-29 MED ORDER — POTASSIUM CHLORIDE CRYS ER 20 MEQ PO TBCR
40.0000 meq | EXTENDED_RELEASE_TABLET | Freq: Once | ORAL | Status: AC
  Administered 2020-08-29: 40 meq via ORAL
  Filled 2020-08-29: qty 2

## 2020-08-29 MED ORDER — IOHEXOL 300 MG/ML  SOLN
100.0000 mL | Freq: Once | INTRAMUSCULAR | Status: AC | PRN
  Administered 2020-08-29: 100 mL via INTRAVENOUS

## 2020-08-29 MED ORDER — IOHEXOL 9 MG/ML PO SOLN
500.0000 mL | ORAL | Status: AC
  Administered 2020-08-29 (×2): 500 mL via ORAL

## 2020-08-29 MED ORDER — SODIUM CHLORIDE 0.9 % IV SOLN
INTRAVENOUS | Status: DC | PRN
  Administered 2020-08-29: 250 mL via INTRAVENOUS

## 2020-08-29 NOTE — Progress Notes (Signed)
Consult PROGRESS NOTE    William Parks  MVH:[id number] DOB: January 11, 1946 DOA: 08/26/2020 PCP: Alba Cory, MD   Chief Complaint  Patient presents with  . Abdominal Pain    Brief Narrative:  75 yo M DM2, HTN, HLD, neuropothy came in with Right Lq pain 5 days no nausea voting or fever, not taking his insulin or BP meds for the past 3 days. He was too weak to even get to get his medicine.  Found to have perforated appendix, admitted under surgery service and we were consulted for diabetes management. Patient s/p percutaneous drain placement with IR.   Assessment & Plan:   Active Problems:   Hypertension   Dyslipidemia   Ruptured appendicitis   Acute appendicitis with localized peritonitis and abscess   DM2 (diabetes mellitus, type 2) (HCC)   DM (diabetes mellitus), type 2, uncontrolled (HCC)   #1 perforated appendix -Patient noted to have a localized peritonitis and abscess formation per CT abdomen and pelvis scan. -Status post percutaneous drain placement per IR. -On IV Zosyn. -IV fluids -Per primary team/surgical team. -Diet per general surgery.  2.  Uncontrolled type 2 diabetes mellitus -Hemoglobin A1c 10.5 (08/27/2020) --cont Lantus 30u nightly --SSI TID, no night-time coverage -Consult with diabetic coordinator for diabetes education.  3.  Diabetic neuropathy -Lyrica  4.  Hypertension -Blood pressure noted to be borderline intermittently -Cozaar held -Continue Lopressor.  5.  Hyperlipidemia -Statin.  6.  Tobacco abuse -Tobacco cessation stressed to patient.  Nicotine patch.  Constipation --consider scheduled MIralax   DVT prophylaxis: SCDs Code Status: Full Family Communication:  Disposition:   Status is: Inpatient    Dispo: The patient is from: Home              Anticipated d/c is to: per primary team         Consultants:   [facility] hospitalist: Dr. Adela Glimpse 08/26/2020    Procedures:   CT abdomen and pelvis 08/26/2020  Status post CT-guided  placement of 8.5 French pigtail drainage catheter into right lower quadrant periappendiceal phlegmon per Dr. Loreta Ave IR 08/27/2020  Antimicrobials:   IV Zosyn 08/26/2020>>>>>   Subjective: Pt reported hard stool and sore abdomen.   Objective: Vitals:   08/28/20 2143 08/29/20 0505 08/29/20 0818 08/29/20 1602  BP:  131/74 (!) 130/98 (!) 151/94  Pulse:  91 90 88  Resp:  17 17 20   Temp: 99.8 F (37.7 C) 98.8 F (37.1 C) 98.4 F (36.9 C) 99.1 F (37.3 C)  TempSrc: Oral Oral Oral   SpO2:  95% 95% 98%  Weight:      Height:        Intake/Output Summary (Last 24 hours) at 08/29/2020 1637 Last data filed at 08/29/2020 0610 Gross per 24 hour  Intake 1085.11 ml  Output 1270 ml  Net -184.89 ml   Filed Weights   08/26/20 1933 08/26/20 2048  Weight: 86.2 kg 81.6 kg    Examination:  Constitutional: NAD, AAOx3 HEENT: conjunctivae and lids normal, EOMI CV: No cyanosis.   RESP: normal respiratory effort, on RA GI: abdominal drain outputting serosanguinous fluids Extremities: No effusions, edema in BLE SKIN: warm, dry Neuro: II - XII grossly intact.   Psych: depressed mood and affect.  Appropriate judgement and reason   Data Reviewed: I have personally reviewed following labs and imaging studies  CBC: Recent Labs  Lab 08/26/20 1529 08/27/20 0101 08/27/20 1826 08/28/20 0550 08/29/20 0427  WBC 12.5* 13.2* 10.1 10.4 12.5*  NEUTROABS  --  11.2*  --   --  9.9*  HGB 14.3 14.2 14.2 12.8* 12.9*  HCT 43.0 40.7 41.5 37.7* 36.7*  MCV 85.7 84.4 85.2 84.5 84.2  PLT 228 214 208 195 207    Basic Metabolic Panel: Recent Labs  Lab 08/26/20 1529 08/27/20 0101 08/27/20 1826 08/28/20 0550 08/29/20 0427  NA 132* 137 136 136 138  K 4.2 4.0 3.8 3.4* 3.2*  CL 95* 100 100 102 103  CO2 25 26 24 26 25   GLUCOSE 376* 263* 189* 226* 92  BUN 20 19 19 23 13   CREATININE 1.14 1.12 1.07 1.26* 1.08  CALCIUM 9.0 8.8* 8.8* 8.3* 8.0*  MG  --  1.8  --   --  1.7    GFR: Estimated Creatinine  Clearance: 57.2 mL/min (by C-G formula based on SCr of 1.08 mg/dL).  Liver Function Tests: Recent Labs  Lab 08/26/20 1529 08/27/20 0101  AST 21 20  ALT 28 27  ALKPHOS 78 76  BILITOT 1.2 1.2  PROT 8.0 7.6  ALBUMIN 3.3* 2.9*    CBG: Recent Labs  Lab 08/28/20 2348 08/29/20 0338 08/29/20 0816 08/29/20 1126 08/29/20 1603  GLUCAP 168* 99 110* 120* 127*     Recent Results (from the past 240 hour(s))  Resp Panel by RT-PCR (Flu A&B, Covid) Nasopharyngeal Swab     Status: None   Collection Time: 08/26/20  4:49 PM   Specimen: Nasopharyngeal Swab; Nasopharyngeal(NP) swabs in vial transport medium  Result Value Ref Range Status   SARS Coronavirus 2 by RT PCR NEGATIVE NEGATIVE Final    Comment: (NOTE) SARS-CoV-2 target nucleic acids are NOT DETECTED.  The SARS-CoV-2 RNA is generally detectable in upper respiratory specimens during the acute phase of infection. The lowest concentration of SARS-CoV-2 viral copies this assay can detect is 138 copies/mL. A negative result does not preclude SARS-Cov-2 infection and should not be used as the sole basis for treatment or other patient management decisions. A negative result may occur with  improper specimen collection/handling, submission of specimen other than nasopharyngeal swab, presence of viral mutation(s) within the areas targeted by this assay, and inadequate number of viral copies(<138 copies/mL). A negative result must be combined with clinical observations, patient history, and epidemiological information. The expected result is Negative.  Fact Sheet for Patients:  10/29/20  Fact Sheet for Healthcare Providers:  10/26/20  This test is no t yet approved or cleared by the [employer] FDA and  has been authorized for detection and/or diagnosis of SARS-CoV-2 by FDA under an Emergency Use Authorization (EUA). This EUA will remain  in effect (meaning this test  can be used) for the duration of the COVID-19 declaration under Section 564(b)(1) of the Act, 21 U.S.C.section 360bbb-3(b)(1), unless the authorization is terminated  or revoked sooner.       Influenza A by PCR NEGATIVE NEGATIVE Final   Influenza B by PCR NEGATIVE NEGATIVE Final    Comment: (NOTE) The Xpert Xpress SARS-CoV-2/FLU/RSV plus assay is intended as an aid in the diagnosis of influenza from Nasopharyngeal swab specimens and should not be used as a sole basis for treatment. Nasal washings and aspirates are unacceptable for Xpert Xpress SARS-CoV-2/FLU/RSV testing.  Fact Sheet for Patients: [employer]ious[employer]ker[employer]it  Fact Sheet for Healthcare Providers: [place]  This test is not yet approved or cleared by the [employer] FDA and has been authorized for detection and/or diagnosis of SARS-CoV-2 by FDA under an Emergency Use Authorization (EUA). This EUA will remain in effect (meaning this test can be used) for the duration of  the COVID-19 declaration under Section 564(b)(1) of the Act, 21 U.S.C. section 360bbb-3(b)(1), unless the authorization is terminated or revoked.  Performed at [facility], [address] [place], [place] [address]   Aerobic/Anaerobic Culture (surgical/deep wound)     Status: None (Preliminary result)   Collection Time: 08/27/20  3:54 PM   Specimen: Abdomen; Abscess  Result Value Ref Range Status   Specimen Description   Final    ABDOMEN Performed at [facility], [address] [place], [place] [address]    Special Requests   Final    NONE Performed at [facility], [address] [place], [place] [address]    Gram Stain   Final    MODERATE WBC PRESENT, PREDOMINANTLY PMN ABUNDANT GRAM POSITIVE COCCI ABUNDANT GRAM POSITIVE RODS Performed at [facility], [address], [place], [place] [address]    Culture   Final    RARE GRAM NEGATIVE  RODS MODERATE STREPTOCOCCUS ANGINOSIS IDENTIFICATION AND SUSCEPTIBILITIES TO FOLLOW NO ANAEROBES ISOLATED; CULTURE IN PROGRESS FOR 5 DAYS    Report Status PENDING  Incomplete  Culture, blood (routine x 2)     Status: None (Preliminary result)   Collection Time: 08/27/20  6:10 PM   Specimen: BLOOD  Result Value Ref Range Status   Specimen Description BLOOD BLOOD LEFT HAND  Final   Special Requests   Final    BOTTLES DRAWN AEROBIC AND ANAEROBIC Blood Culture adequate volume   Culture   Final    NO GROWTH 2 DAYS Performed at [facility], [address], [place], [place] [address]    Report Status PENDING  Incomplete  Culture, blood (routine x 2)     Status: None (Preliminary result)   Collection Time: 08/27/20  6:26 PM   Specimen: BLOOD  Result Value Ref Range Status   Specimen Description BLOOD LEFT ANTECUBITAL  Final   Special Requests   Final    BOTTLES DRAWN AEROBIC AND ANAEROBIC Blood Culture adequate volume   Culture   Final    NO GROWTH 2 DAYS Performed at [facility], [address], [place], [place] [address]    Report Status PENDING  Incomplete         Radiology Studies: No results found.      Scheduled Meds: . atorvastatin  10 mg Oral QHS  . insulin aspart  0-15 Units Subcutaneous TID WC  . insulin glargine  30 Units Subcutaneous QHS  . metoprolol tartrate  25 mg Oral BID  . nicotine  7 mg Transdermal Daily  . ondansetron (ZOFRAN) IV  4 mg Intravenous Once  . pantoprazole (PROTONIX) IV  40 mg Intravenous QHS  . pregabalin  150 mg Oral QHS  . sodium chloride flush  5 mL Intracatheter Q8H  . tiZANidine  2 mg Oral QHS   Continuous Infusions: . sodium chloride 50 mL/hr at 08/29/20 0845  . sodium chloride 250 mL (08/29/20 0020)  . piperacillin-tazobactam (ZOSYN)  IV 3.375 g (08/29/20 0957)     LOS: 3 days    Darlin Priestly, MD [facility]   To contact the attending provider between 7A-7P or the covering provider during  after hours 7P-7A, please log into the web site [email or web address] and access using universal [place] password for that web site. If you do not have the password, please call the hospital operator.  08/29/2020, 4:37 PM

## 2020-08-29 NOTE — Care Management Important Message (Signed)
Important Message  Patient Details  Name: William Parks MRN: [id number] Date of Birth: 16-Nov-1945   Medicare Important Message Given:  Yes     Olegario Messier A Kjersti Dittmer 08/29/2020, 2:28 PM

## 2020-08-29 NOTE — Progress Notes (Signed)
[facility] SURGICAL PROGRESS NOTE (cpt [phone])  Hospital Day(s): 3.   Interval History: Patient seen and examined, no acute events or new complaints overnight. Patient reports he continues to have relatively similar abdominal discomfort in his umbilicus and RLQ. This seemed to be exacerbated by straining to have a BM this morning. He denies fever x48 hours, chills, nausea, emesis. He does have a bump in his leukocytosis to 12.5K (from 10 the previous 48 hours). His renal function has improved, sCr - 1.08; UO - 1.2L. He remains hypokalemic to 3.2. Percutaneous drain placement output 45 ccs. Cx from drain placement growing gram positive cocci and rods. BCx with NGTD (2 days). He continues on Zosyn. He was advanced to CLD yesterday; tolerating well.   Review of Systems:  Constitutional: denies fever, chills  HEENT: denies cough or congestion  Respiratory: denies any shortness of breath  Cardiovascular: denies chest pain or palpitations  Gastrointestinal: + abdominal pain, denied N/V, or diarrhea/and bowel function as per interval history Genitourinary: denies burning with urination or urinary frequency  Vital signs in last 24 hours: [min-max] current  Temp:  [98.8 F (37.1 C)-99.9 F (37.7 C)] 98.8 F (37.1 C) (05/06 0505) Pulse Rate:  [86-98] 91 (05/06 0505) Resp:  [16-24] 17 (05/06 0505) BP: (116-131)/(61-74) 131/74 (05/06 0505) SpO2:  [91 %-96 %] 95 % (05/06 0505)     Height: 5\' 8"  (172.7 cm) Weight: 81.6 kg BMI (Calculated): 27.36   Intake/Output last 2 shifts:  05/05 0701 - 05/06 0700 In: 1090.1 [I.V.:844.2; IV Piggyback:240.9] Out: 1270 [Urine:1225; Drains:45]   Physical Exam:  Constitutional: alert, cooperative and no distress  HENT: normocephalic without obvious abnormality  Eyes: PERRL, EOM's grossly intact and symmetric  Respiratory: breathing non-labored at rest  Cardiovascular: regular rate and sinus rhythm  Gastrointestinal: Soft, RLQ soreness and  umbilical tenderness (similar to previous), and non-distended, no rebound/guarding. Drain in the RLQ with thick serosanguinous output Musculoskeletal: no edema or wounds, motor and sensation grossly intact, NT    Labs:  CBC Latest Ref Rng & Units 08/29/2020 08/28/2020 08/27/2020  WBC 4.0 - 10.5 K/uL 12.5(H) 10.4 10.1  Hemoglobin 13.0 - 17.0 g/dL 12.9(L) 12.8(L) 14.2  Hematocrit 39.0 - 52.0 % 36.7(L) 37.7(L) 41.5  Platelets 150 - 400 K/uL 207 195 208   CMP Latest Ref Rng & Units 08/29/2020 08/28/2020 08/27/2020  Glucose 70 - 99 mg/dL 92 10/27/2020) 191(Y)  BUN 8 - 23 mg/dL 13 23 19   Creatinine 0.61 - 1.24 mg/dL 782(N ) 5.62  Sodium 135 - 145 mmol/L 138 136 136  Potassium 3.5 - 5.1 mmol/L 3.2(L) 3.4(L) 3.8  Chloride 98 - 111 mmol/L 103 102 100  CO2 22 - 32 mmol/L 25 26 24   Calcium 8.9 - 10.3 mg/dL 8.0(L) 8.3(L) 8.8(L)  Total Protein 6.5 - 8.1 g/dL - - -  Total Bilirubin 0.3 - 1.2 mg/dL - - -  Alkaline Phos 38 - 126 U/L - - -  AST 15 - 41 U/L - - -  ALT 0 - 44 U/L - - -     Imaging studies: No new pertinent imaging studies   Assessment/Plan: (ICD-10's: K35.80) 75 y.o. male with leukocytosis and persistent abdominal pain admitted with ruptured appendicitis with abscess s/p drain placement on 05/04, complicated by pertinent comorbidities includingHTN, HLD, DM2.    - he does have a worsening leukocytosis this morning which may be a delayed response from drain placement; however, I do think it will be best to repeat CT Abdomen/Pelvis this  morning to ensure no worsening intra-abdominal process.    - Continue IVF support - Continue IV Abx (Zosyn); Day 3   - Continue percutaneous drain; monitor and record output; flush daily with 5-10 ccs NS             - No emergent surgical intervention; will follow up outpatient for interval appendectomy in ~8 weeks - Monitor abdominal examination  - Pain control prn; antiemetics prn - Mobilization as  tolerated - Appreciate medicine assistance with comorbid conditions   All of the above findings and recommendations were discussed with the patient, and the medical team, and all of patient's questions were answered to his expressed satisfaction.  -- Lynden Oxford, PA-C [facility] 08/29/2020, 7:19 AM [phone] M-F: 7am - 4pm

## 2020-08-30 DIAGNOSIS — K3531 Acute appendicitis with localized peritonitis and gangrene, without perforation: Secondary | ICD-10-CM | POA: Diagnosis not present

## 2020-08-30 DIAGNOSIS — K3533 Acute appendicitis with perforation and localized peritonitis, with abscess: Secondary | ICD-10-CM | POA: Diagnosis not present

## 2020-08-30 DIAGNOSIS — E1149 Type 2 diabetes mellitus with other diabetic neurological complication: Secondary | ICD-10-CM | POA: Diagnosis not present

## 2020-08-30 DIAGNOSIS — Z794 Long term (current) use of insulin: Secondary | ICD-10-CM | POA: Diagnosis not present

## 2020-08-30 LAB — BASIC METABOLIC PANEL
Anion gap: 10 (ref 5–15)
BUN: 12 mg/dL (ref 8–23)
CO2: 25 mmol/L (ref 22–32)
Calcium: 8.1 mg/dL — ABNORMAL LOW (ref 8.9–10.3)
Chloride: 103 mmol/L (ref 98–111)
Creatinine, Ser: 0.89 mg/dL (ref 0.61–1.24)
GFR, Estimated: >60 mL/min (ref >60)
Glucose, Bld: 82 mg/dL (ref 70–99)
Potassium: 3.3 mmol/L — ABNORMAL LOW (ref 3.5–5.1)
Sodium: 138 mmol/L (ref 135–145)

## 2020-08-30 LAB — CBC
HCT: 37.2 % — ABNORMAL LOW (ref 39.0–52.0)
Hemoglobin: 12.7 g/dL — ABNORMAL LOW (ref 13.0–17.0)
MCH: 28.5 pg (ref 26.0–34.0)
MCHC: 34.1 g/dL (ref 30.0–36.0)
MCV: 83.4 fL (ref 80.0–100.0)
Platelets: 224 10*3/uL (ref 150–400)
RBC: 4.46 MIL/uL (ref 4.22–5.81)
RDW: 13.6 % (ref 11.5–15.5)
WBC: 10.7 10*3/uL — ABNORMAL HIGH (ref 4.0–10.5)
nRBC: 0 % (ref 0.0–0.2)

## 2020-08-30 LAB — GLUCOSE, CAPILLARY
Glucose-Capillary: 86 mg/dL (ref 70–99)
Glucose-Capillary: 79 mg/dL (ref 70–99)
Glucose-Capillary: 70 mg/dL (ref 70–99)
Glucose-Capillary: 106 mg/dL — ABNORMAL HIGH (ref 70–99)

## 2020-08-30 LAB — MAGNESIUM: Magnesium: 1.8 mg/dL (ref 1.7–2.4)

## 2020-08-30 MED ORDER — INSULIN GLARGINE 100 UNIT/ML ~~LOC~~ SOLN
28.0000 [IU] | Freq: Every day | SUBCUTANEOUS | Status: DC
  Administered 2020-08-30: 28 [IU] via SUBCUTANEOUS
  Filled 2020-08-30 (×2): qty 0.28

## 2020-08-30 NOTE — Plan of Care (Signed)
  Problem: Education: Goal: Knowledge of General Education information will improve Description: Including pain rating scale, medication(s)/side effects and non-pharmacologic comfort measures Outcome: Progressing   Problem: Clinical Measurements: Goal: Ability to maintain clinical measurements within normal limits will improve Outcome: Progressing Goal: Will remain free from infection Outcome: Progressing Goal: Diagnostic test results will improve Outcome: Progressing Goal: Respiratory complications will improve Outcome: Progressing   Problem: Activity: Goal: Risk for activity intolerance will decrease Outcome: Progressing   Problem: Nutrition: Goal: Adequate nutrition will be maintained Outcome: Progressing   

## 2020-08-30 NOTE — Progress Notes (Signed)
CC: Appendicitis  subjective: Pain a little bit better.  Tolerating diet.  No fevers no chills.  CT scan performed yesterday.  I have personally reviewed the images showing evidence of a new developing collection away from the current drain.  There is persistent inflammation of the appendix and terminal ileum.  I have discussed the case in detail with Dr. Aleen Campi who also discussed the CT with Dr. Loreta Ave who is the interventional radiologist.  It is their general consensus that they will attempt another drain on Monday. Do think is worth giving him a shot of interventional drain and antibiotics before committing to a right colectomy. WBC improving  Objective: Vital signs in last 24 hours: Temp:  [98.3 F (36.8 C)-101 F (38.3 C)] 99.3 F (37.4 C) (05/07 0810) Pulse Rate:  [74-90] 74 (05/07 0810) Resp:  [16-20] 16 (05/07 0810) BP: (130-151)/(68-98) 136/68 (05/07 0810) SpO2:  [95 %-98 %] 95 % (05/07 0810) Last BM Date: 08/29/20  Intake/Output from previous day: 05/06 0701 - 05/07 0700 In: -  Out: 600 [Urine:600] Intake/Output this shift: No intake/output data recorded.  Physical exam:  NAd, alert Abd: soft, Mild tenderness RLQ, no peritonitis, drain in place w serous and cloudy fluid. Ext: no edema and well perfused  Lab Results: CBC  Recent Labs    08/29/20 0427 08/30/20 0421  WBC 12.5* 10.7*  HGB 12.9* 12.7*  HCT 36.7* 37.2*  PLT 207 224   BMET Recent Labs    08/29/20 0427 08/30/20 0421  NA 138 138  K 3.2* 3.3*  CL 103 103  CO2 25 25  GLUCOSE 92 82  BUN 13 12  CREATININE 1.08 0.89  CALCIUM 8.0* 8.1*   PT/INR No results for input(s): LABPROT, INR in the last 72 hours. ABG No results for input(s): PHART, HCO3 in the last 72 hours.  Invalid input(s): PCO2, PO2  Studies/Results: CT ABDOMEN PELVIS W CONTRAST  Result Date: 08/29/2020 CLINICAL DATA:  75 year old with right lower quadrant pain. History of ruptured appendicitis with abscess. Elevated white  blood cell count. Percutaneous drain was placed on 08/27/2020 EXAM: CT ABDOMEN AND PELVIS WITH CONTRAST TECHNIQUE: Multidetector CT imaging of the abdomen and pelvis was performed using the standard protocol following bolus administration of intravenous contrast. CONTRAST:  OMNIPAQUE IOHEXOL 300 MG/ML  SOLN COMPARISON:  CT abdomen and pelvis 08/26/2020 FINDINGS: Lower chest: Compressive atelectasis at the lung bases. Trace right pleural fluid. Hepatobiliary: Again noted is a 1 cm hypodensity at the hepatic dome that could represent a cyst but indeterminate. Otherwise, normal appearance of the liver and gallbladder. No biliary dilatation. Main portal venous system is patent. Pancreas: Unremarkable. No pancreatic ductal dilatation or surrounding inflammatory changes. Spleen: Normal in size without focal abnormality. Adrenals/Urinary Tract: Normal appearance of the adrenal glands. Again noted is a low-density cyst in the left kidney upper pole that measures roughly 3.3 cm. Fluid in the urinary bladder. Urinary bladder is displaced towards the left due to the inflammatory changes in the right lower quadrant. No significant hydronephrosis. Mild dilatation of the right ureter. Stomach/Bowel: Again noted is an abnormal appendix compatible with a ruptured appendicitis. Complex fluid collections around the ruptured appendix compatible with periappendiceal abscesses. There is new or increased wall thickening in the distal ileum and terminal ileum, best seen on sequence 2 image 60. No significant small or large bowel dilatation. There is oral contrast in the colon. Normal appearance of the stomach and duodenum. Vascular/Lymphatic: Atherosclerotic disease in the abdominal aorta without aneurysm. Main visceral  arteries are patent. No significant lymph node enlargement in the abdomen or pelvis. Reproductive: Prostate is prominent measuring 5.8 cm in diameter. Other: Again noted are inflammatory changes in the right lower  quadrant compatible with a ruptured appendicitis. Percutaneous drain has been placed in the right lower quadrant and there has been partial decompression of the complex periappendiceal abscess. There continues to be a loculated component medial to the drain on sequence 2, image 72 that measures 4.9 x 3.8 cm. There is another abscess posterior to the drain and posterior to the distal ileum on sequence 2 image 68 that measures 3.9 x 3.0 cm. Presacral edema. No significant free fluid in the pelvis. Umbilical hernia containing fat. Bilateral inguinal hernias. Musculoskeletal: No acute bone abnormality. Mild compression deformity along the anterior superior endplate of T11 is compatible with an old injury. IMPRESSION: 1. Increased inflammation in the right lower quadrant associated with the perforated appendicitis and periappendiceal abscesses. There continues to be complex abscess collections in the right lower quadrant despite placement of a percutaneous drain in this area. The abscess collections are medial and posterior to the existing drain. 2. Increased inflammatory changes involving the distal ileum and terminal ileum. These results were called by telephone at the time of interpretation on 08/29/2020 at 5:43 pm to provider Dr. Everlene Farrier, who verbally acknowledged these results. Electronically Signed   By: Richarda Overlie M.D.   On: 08/29/2020 17:54    Anti-infectives: Anti-infectives (From admission, onward)   Start     Dose/Rate Route Frequency Ordered Stop   08/27/20 0100  piperacillin-tazobactam (ZOSYN) IVPB 3.375 g        3.375 g 12.5 mL/hr over 240 Minutes Intravenous Every 8 hours 08/26/20 1736     08/26/20 1700  piperacillin-tazobactam (ZOSYN) IVPB 3.375 g        3.375 g 100 mL/hr over 30 Minutes Intravenous  Once 08/26/20 1651 08/26/20 1900      Assessment/Plan:  Perforated appendicitis with abscess.  Currently another developing abscess.  Plan for IR to perform another drain placement per Dr.  Aleen Campi and Dr. Loreta Ave. Is not toxic and is tolerating diet.  No need for emergent surgical intervention.  I have updated the patient in detail about his condition.  We will plan to avoid any major operation during this hospitalization.  If he fails medical and interventional treatment may need to do a right colectomy.  note that I spent over 35 minutes in this encounter with greater than 50% spent in coordination and counseling of his care  Sterling Big, MD, [facility]  08/30/2020

## 2020-08-30 NOTE — Progress Notes (Signed)
Consult PROGRESS NOTE    William Parks  QQV:[id number] DOB: 1945-12-14 DOA: 08/26/2020 PCP: Alba Cory, MD   Chief Complaint  Patient presents with  . Abdominal Pain    Brief Narrative:  75 yo M DM2, HTN, HLD, neuropothy came in with Right Lq pain 5 days no nausea voting or fever, not taking his insulin or BP meds for the past 3 days. He was too weak to even get to get his medicine.  Found to have perforated appendix, admitted under surgery service and we were consulted for diabetes management. Patient s/p percutaneous drain placement with IR.   Assessment & Plan:   Active Problems:   Hypertension   Dyslipidemia   Ruptured appendicitis   Acute appendicitis with localized peritonitis and abscess   DM2 (diabetes mellitus, type 2) (HCC)   DM (diabetes mellitus), type 2, uncontrolled (HCC)   #1 perforated appendix with abscess -Patient noted to have a localized peritonitis and abscess formation per CT abdomen and pelvis scan. -Status post percutaneous drain placement per IR. -On IV Zosyn. --CT 5/6 found another developing abscess Plan: --cont IV zosyn --Plan for IR to perform another drain placement per Dr. Aleen Campi and Dr. Loreta Ave.  2.  Uncontrolled type 2 diabetes mellitus -Hemoglobin A1c 10.5 (08/27/2020) --BG have been wnl Plan: --reduce Lantus from 30 to 28 units nightly --SSI TID, no night-time coverage -Consult with diabetic coordinator for diabetes education.  3.  Diabetic neuropathy --cont Lyrica  4.  Hypertension -Blood pressure noted to be borderline intermittently -Continue Lopressor. --Hold cozzar  5.  Hyperlipidemia -Statin.  6.  Tobacco abuse -Tobacco cessation stressed to patient.   --cont Nicotine patch  Constipation --consider scheduled MIralax   DVT prophylaxis: SCDs Code Status: Full Family Communication:  Disposition:   Status is: Inpatient  Dispo: The patient is from: Home              Anticipated d/c is to: per primary team          Consultants:   [facility] hospitalist: Dr. Adela Glimpse 08/26/2020    Procedures:   CT abdomen and pelvis 08/26/2020  Status post CT-guided placement of 8.5 French pigtail drainage catheter into right lower quadrant periappendiceal phlegmon per Dr. Loreta Ave IR 08/27/2020  Antimicrobials:   IV Zosyn 08/26/2020>>>>>   Subjective: Pt reported some minor abdominal pain.  BG have been wnl.   Objective: Vitals:   08/30/20 0045 08/30/20 0617 08/30/20 0810 08/30/20 1539  BP:  132/71 136/68 (!) 148/79  Pulse:  76 74 79  Resp:  16 16 16   Temp: 98.3 F (36.8 C) 98.6 F (37 C) 99.3 F (37.4 C) 100 F (37.8 C)  TempSrc: Oral  Oral Oral  SpO2:  95% 95% 97%  Weight:      Height:        Intake/Output Summary (Last 24 hours) at 08/30/2020 1651 Last data filed at 08/30/2020 0630 Gross per 24 hour  Intake --  Output 600 ml  Net -600 ml   Filed Weights   08/26/20 1933 08/26/20 2048  Weight: 86.2 kg 81.6 kg    Examination:  Constitutional: NAD, AAOx3 HEENT: conjunctivae and lids normal, EOMI CV: No cyanosis.   RESP: normal respiratory effort, on RA GI: abdominal drain not much output Extremities: No effusions, edema in BLE SKIN: warm, dry Neuro: II - XII grossly intact.   Psych: flat mood and affect.     Data Reviewed: I have personally reviewed following labs and imaging studies  CBC: Recent Labs  Lab 08/27/20 0101 08/27/20 1826 08/28/20 0550 08/29/20 0427 08/30/20 0421  WBC 13.2* 10.1 10.4 12.5* 10.7*  NEUTROABS 11.2*  --   --  9.9*  --   HGB 14.2 14.2 12.8* 12.9* 12.7*  HCT 40.7 41.5 37.7* 36.7* 37.2*  MCV 84.4 85.2 84.5 84.2 83.4  PLT 214 208 195 207 224    Basic Metabolic Panel: Recent Labs  Lab 08/27/20 0101 08/27/20 1826 08/28/20 0550 08/29/20 0427 08/30/20 0421  NA 137 136 136 138 138  K 4.0 3.8 3.4* 3.2* 3.3*  CL 100 100 102 103 103  CO2 GLUCOSE 263* 189* 226* 92 82  BUN CREATININE 1.12 1.07 1.26* 1.08 0.89  CALCIUM  8.8* 8.8* 8.3* 8.0* 8.1*  MG 1.8  --   --  1.7 1.8    GFR: Estimated Creatinine Clearance: 69.4 mL/min (by C-G formula based on SCr of 0.89 mg/dL).  Liver Function Tests: Recent Labs  Lab 08/26/20 1529 08/27/20 0101  AST 21 20  ALT 28 27  ALKPHOS 78 76  BILITOT 1.2 1.2  PROT 8.0 7.6  ALBUMIN 3.3* 2.9*    CBG: Recent Labs  Lab 08/29/20 1603 08/29/20 2110 08/30/20 0814 08/30/20 1138 08/30/20 1628  GLUCAP 127* 88 86 79 70     Recent Results (from the past 240 hour(s))  Resp Panel by RT-PCR (Flu A&B, Covid) Nasopharyngeal Swab     Status: None   Collection Time: 08/26/20  4:49 PM   Specimen: Nasopharyngeal Swab; Nasopharyngeal(NP) swabs in vial transport medium  Result Value Ref Range Status   SARS Coronavirus 2 by RT PCR NEGATIVE NEGATIVE Final    Comment: (NOTE) SARS-CoV-2 target nucleic acids are NOT DETECTED.  The SARS-CoV-2 RNA is generally detectable in upper respiratory specimens during the acute phase of infection. The lowest concentration of SARS-CoV-2 viral copies this assay can detect is 138 copies/mL. A negative result does not preclude SARS-Cov-2 infection and should not be used as the sole basis for treatment or other patient management decisions. A negative result may occur with  improper specimen collection/handling, submission of specimen other than nasopharyngeal swab, presence of viral mutation(s) within the areas targeted by this assay, and inadequate number of viral copies(<138 copies/mL). A negative result must be combined with clinical observations, patient history, and epidemiological information. The expected result is Negative.  Fact Sheet for Patients:  BloggerCourse.com  Fact Sheet for Healthcare Providers:  SeriousBroker.it  This test is no t yet approved or cleared by the [place] FDA and  has been authorized for detection and/or diagnosis of SARS-CoV-2 by FDA under an  Emergency Use Authorization (EUA). This EUA will remain  in effect (meaning this test can be used) for the duration of the COVID-19 declaration under Section 564(b)(1) of the Act, 21 U.S.C.section 360bbb-3(b)(1), unless the authorization is terminated  or revoked sooner.       Influenza A by PCR NEGATIVE NEGATIVE Final   Influenza B by PCR NEGATIVE NEGATIVE Final    Comment: (NOTE) The Xpert Xpress SARS-CoV-2/FLU/RSV plus assay is intended as an aid in the diagnosis of influenza from Nasopharyngeal swab specimens and should not be used as a sole basis for treatment. Nasal washings and aspirates are unacceptable for Xpert Xpress SARS-CoV-2/FLU/RSV testing.  Fact Sheet for Patients: BloggerCourse.com  Fact Sheet for Healthcare Providers: SeriousBroker.it  This test is not yet approved or cleared by the [place] FDA and has been authorized for detection  and/or diagnosis of SARS-CoV-2 by FDA under an Emergency Use Authorization (EUA). This EUA will remain in effect (meaning this test can be used) for the duration of the COVID-19 declaration under Section 564(b)(1) of the Act, 21 U.S.C. section 360bbb-3(b)(1), unless the authorization is terminated or revoked.  Performed at [facility], [address] [place], [place] [address]   Aerobic/Anaerobic Culture (surgical/deep wound)     Status: None (Preliminary result)   Collection Time: 08/27/20  3:54 PM   Specimen: Abdomen; Abscess  Result Value Ref Range Status   Specimen Description   Final    ABDOMEN Performed at [facility], [address] [place], [place] [address]    Special Requests   Final    NONE Performed at [facility], [address] [place], [place] [address]    Gram Stain   Final    MODERATE WBC PRESENT, PREDOMINANTLY PMN ABUNDANT GRAM POSITIVE COCCI ABUNDANT GRAM POSITIVE RODS Performed at [facility], [address] [place], [place] [address]    Culture   Final    RARE ESCHERICHIA COLI MODERATE STREPTOCOCCUS ANGINOSIS NO ANAEROBES ISOLATED; CULTURE IN PROGRESS FOR 5 DAYS    Report Status PENDING  Incomplete   Organism ID, Bacteria ESCHERICHIA COLI  Final   Organism ID, Bacteria STREPTOCOCCUS ANGINOSIS  Final      Susceptibility   Escherichia coli - MIC*    AMPICILLIN >=32 RESISTANT Resistant     CEFAZOLIN <=4 SENSITIVE Sensitive     CEFEPIME <=0.12 SENSITIVE Sensitive     CEFTAZIDIME <=1 SENSITIVE Sensitive     CEFTRIAXONE <=0.25 SENSITIVE Sensitive     CIPROFLOXACIN <=0.25 SENSITIVE Sensitive     GENTAMICIN <=1 SENSITIVE Sensitive     IMIPENEM <=0.25 SENSITIVE Sensitive     TRIMETH/SULFA <=20 SENSITIVE Sensitive     AMPICILLIN/SULBACTAM 16 INTERMEDIATE Intermediate     PIP/TAZO <=4 SENSITIVE Sensitive     * RARE ESCHERICHIA COLI   Streptococcus anginosis - MIC*    PENICILLIN 0.12 SENSITIVE Sensitive     CEFTRIAXONE 0.5 SENSITIVE Sensitive     ERYTHROMYCIN 2 RESISTANT Resistant     LEVOFLOXACIN 0.5 SENSITIVE Sensitive     VANCOMYCIN 0.5 SENSITIVE Sensitive     * MODERATE STREPTOCOCCUS ANGINOSIS  Culture, blood (routine x 2)     Status: None (Preliminary result)   Collection Time: 08/27/20  6:10 PM   Specimen: BLOOD  Result Value Ref Range Status   Specimen Description BLOOD BLOOD LEFT HAND  Final   Special Requests   Final    BOTTLES DRAWN AEROBIC AND ANAEROBIC Blood Culture adequate volume   Culture   Final    NO GROWTH 3 DAYS Performed at [facility], [address] [place], [place] [address]    Report Status PENDING  Incomplete  Culture, blood (routine x 2)     Status: None (Preliminary result)   Collection Time: 08/27/20  6:26 PM   Specimen: BLOOD  Result Value Ref Range Status   Specimen Description BLOOD LEFT ANTECUBITAL  Final   Special Requests   Final    BOTTLES DRAWN AEROBIC AND ANAEROBIC Blood Culture adequate volume   Culture   Final    NO GROWTH 3  DAYS Performed at [facility], [address] [place], [place] [address]    Report Status PENDING  Incomplete         Radiology Studies: CT ABDOMEN PELVIS W CONTRAST  Result Date: 08/29/2020 CLINICAL DATA:  75 year old with right lower quadrant pain. History of ruptured appendicitis with abscess. Elevated white blood cell count. Percutaneous drain was placed on 08/27/2020 EXAM: CT ABDOMEN AND PELVIS WITH CONTRAST TECHNIQUE: Multidetector CT imaging of the abdomen and pelvis was performed using the standard protocol following bolus administration of intravenous contrast. CONTRAST:  OMNIPAQUE IOHEXOL 300 MG/ML  SOLN COMPARISON:  CT abdomen and pelvis 08/26/2020 FINDINGS: Lower chest: Compressive atelectasis at the lung bases. Trace right pleural fluid. Hepatobiliary: Again noted is a 1 cm hypodensity at the hepatic dome that could represent a cyst but indeterminate. Otherwise, normal appearance of the liver and gallbladder. No biliary dilatation. Main portal venous system is patent. Pancreas: Unremarkable. No pancreatic ductal dilatation or surrounding inflammatory changes. Spleen: Normal in size without focal abnormality. Adrenals/Urinary Tract: Normal appearance of the adrenal glands. Again noted is a low-density cyst in the left kidney upper pole that measures roughly 3.3 cm. Fluid in the urinary bladder. Urinary bladder is displaced towards the left due to the inflammatory changes in the right lower quadrant. No significant hydronephrosis. Mild dilatation of the right ureter. Stomach/Bowel: Again noted is an abnormal appendix compatible with a ruptured appendicitis. Complex fluid collections around the ruptured appendix compatible with periappendiceal abscesses. There is new or increased wall thickening in the distal ileum and terminal ileum, best seen on sequence 2 image 60. No significant small or large bowel dilatation. There is oral contrast in the colon. Normal appearance of the  stomach and duodenum. Vascular/Lymphatic: Atherosclerotic disease in the abdominal aorta without aneurysm. Main visceral arteries are patent. No significant lymph node enlargement in the abdomen or pelvis. Reproductive: Prostate is prominent measuring 5.8 cm in diameter. Other: Again noted are inflammatory changes in the right lower quadrant compatible with a ruptured appendicitis. Percutaneous drain has been placed in the right lower quadrant and there has been partial decompression of the complex periappendiceal abscess. There continues to be a loculated component medial to the drain on sequence 2, image 72 that measures 4.9 x 3.8 cm. There is another abscess posterior to the drain and posterior to the distal ileum on sequence 2 image 68 that measures 3.9 x 3.0 cm. Presacral edema. No significant free fluid in the pelvis. Umbilical hernia containing fat. Bilateral inguinal hernias. Musculoskeletal: No acute bone abnormality. Mild compression deformity along the anterior superior endplate of T11 is compatible with an old injury. IMPRESSION: 1. Increased inflammation in the right lower quadrant associated with the perforated appendicitis and periappendiceal abscesses. There continues to be complex abscess collections in the right lower quadrant despite placement of a percutaneous drain in this area. The abscess collections are medial and posterior to the existing drain. 2. Increased inflammatory changes involving the distal ileum and terminal ileum. These results were called by telephone at the time of interpretation on 08/29/2020 at 5:43 pm to provider Dr. Everlene Farrier, who verbally acknowledged these results. Electronically Signed   By: Richarda Overlie M.D.   On: 08/29/2020 17:54        Scheduled Meds: . atorvastatin  10 mg Oral QHS  . insulin aspart  0-15 Units Subcutaneous TID WC  . insulin glargine  28 Units Subcutaneous QHS  . metoprolol tartrate  25 mg Oral BID  . nicotine  7 mg Transdermal Daily  .  ondansetron (ZOFRAN) IV  4 mg Intravenous Once  . pantoprazole (PROTONIX) IV  40 mg Intravenous QHS  . pregabalin  150 mg Oral QHS  . sodium chloride flush  5 mL Intracatheter Q8H  .  tiZANidine  2 mg Oral QHS   Continuous Infusions: . sodium chloride 100 mL/hr at 08/30/20 0630  . sodium chloride 250 mL (08/29/20 0020)  . piperacillin-tazobactam (ZOSYN)  IV 3.375 g (08/30/20 0941)     LOS: 4 days    Darlin Priestly, MD [facility]   To contact the attending provider between 7A-7P or the covering provider during after hours 7P-7A, please log into the web site [email or web address] and access using universal [place] password for that web site. If you do not have the password, please call the hospital operator.  08/30/2020, 4:51 PM

## 2020-08-31 DIAGNOSIS — E1149 Type 2 diabetes mellitus with other diabetic neurological complication: Secondary | ICD-10-CM | POA: Diagnosis not present

## 2020-08-31 DIAGNOSIS — K3533 Acute appendicitis with perforation and localized peritonitis, with abscess: Secondary | ICD-10-CM | POA: Diagnosis not present

## 2020-08-31 DIAGNOSIS — Z794 Long term (current) use of insulin: Secondary | ICD-10-CM | POA: Diagnosis not present

## 2020-08-31 DIAGNOSIS — K3531 Acute appendicitis with localized peritonitis and gangrene, without perforation: Secondary | ICD-10-CM | POA: Diagnosis not present

## 2020-08-31 DIAGNOSIS — R509 Fever, unspecified: Secondary | ICD-10-CM | POA: Diagnosis not present

## 2020-08-31 LAB — GLUCOSE, CAPILLARY
Glucose-Capillary: 104 mg/dL — ABNORMAL HIGH (ref 70–99)
Glucose-Capillary: 129 mg/dL — ABNORMAL HIGH (ref 70–99)
Glucose-Capillary: 67 mg/dL — ABNORMAL LOW (ref 70–99)
Glucose-Capillary: 82 mg/dL (ref 70–99)
Glucose-Capillary: 87 mg/dL (ref 70–99)
Glucose-Capillary: 91 mg/dL (ref 70–99)
Glucose-Capillary: 91 mg/dL (ref 70–99)

## 2020-08-31 LAB — MAGNESIUM: Magnesium: 1.9 mg/dL (ref 1.7–2.4)

## 2020-08-31 MED ORDER — PEG 3350-KCL-NA BICARB-NACL 420 G PO SOLR
4000.0000 mL | Freq: Once | ORAL | Status: AC
  Administered 2020-08-31: 4000 mL via ORAL
  Filled 2020-08-31: qty 4000

## 2020-08-31 MED ORDER — INSULIN GLARGINE 100 UNIT/ML ~~LOC~~ SOLN
24.0000 [IU] | Freq: Every day | SUBCUTANEOUS | Status: DC
  Administered 2020-09-02: 24 [IU] via SUBCUTANEOUS
  Filled 2020-08-31 (×4): qty 0.24

## 2020-08-31 MED ORDER — METRONIDAZOLE 500 MG PO TABS
500.0000 mg | ORAL_TABLET | Freq: Three times a day (TID) | ORAL | Status: DC
  Administered 2020-08-31 (×2): 500 mg via ORAL
  Filled 2020-08-31 (×5): qty 1

## 2020-08-31 MED ORDER — NEOMYCIN SULFATE 500 MG PO TABS
1000.0000 mg | ORAL_TABLET | Freq: Three times a day (TID) | ORAL | Status: AC
  Administered 2020-08-31 (×2): 1000 mg via ORAL
  Filled 2020-08-31 (×2): qty 2

## 2020-08-31 NOTE — Progress Notes (Signed)
Patient seen and examined.  He had a tough night a morning.  He now complains of chills and persistent pain.  Max was 100.  She tolerated some diet.  PE NAD Abdomen tender to palpation in the right lower quadrant no overt peritonitis but certainly seems to be worse as compared to yesterday.  Drain with serous fluid  A/P 75 year old male with peripheral appendicitis and abscess status post drain.  He still has an undrained abscess.  Unfortunately he is not improving.  I have discussed with Dr. Aleen Campi as well as the the interventional radiologist.  The additional abscess seems to also be difficult to access.  Given the fact that he is clinically not improving and continues to have chills and low-grade temperature I do not think that we have appropriate source control.  After an extensive discussion with the patient, daughter and wife I do think that the best course will be to proceed to the operating room.  We we will start a bowel prep today in case we need to do a right colectomy.  We will start with laparoscopic approach in hopes to do a partial cecetomy  Or appendectomy but realistically speaking we need to be prepared for a formal right colectomy.  Cussed with the patient in length.  Risk benefit and possible complications.  Please note I spent greater than 45 minutes in this encounter with greater than 50% of time spent in coordination and counseling of his care.

## 2020-08-31 NOTE — Progress Notes (Signed)
Consult PROGRESS NOTE    William Parks  NLG:[id number] DOB: 1946-02-25 DOA: 08/26/2020 PCP: Alba Cory, MD   Chief Complaint  Patient presents with  . Abdominal Pain    Brief Narrative:  75 yo M DM2, HTN, HLD, neuropothy came in with Right Lq pain 5 days no nausea voting or fever, not taking his insulin or BP meds for the past 3 days. He was too weak to even get to get his medicine.  Found to have perforated appendix, admitted under surgery service and we were consulted for diabetes management. Patient s/p percutaneous drain placement with IR.   Assessment & Plan:   Active Problems:   Hypertension   Dyslipidemia   Ruptured appendicitis   Acute appendicitis with localized peritonitis and abscess   DM2 (diabetes mellitus, type 2) (HCC)   DM (diabetes mellitus), type 2, uncontrolled (HCC)   #1 perforated appendix with abscess -Patient noted to have a localized peritonitis and abscess formation per CT abdomen and pelvis scan. -Status post percutaneous drain placement per IR. -On IV Zosyn. --CT 5/6 found another developing abscess, appeared difficult to access with IR approach. Plan: --cont IV zosyn --plan for surgery tomorrow --Bowel prep today   2.  Uncontrolled type 2 diabetes mellitus -Hemoglobin A1c 10.5 (08/27/2020) --BG have been wnl to low Plan: --agree with holding Lantus tonight --resume Lantus at reduced 24u after surgery --SSI TID, no night-time coverage -Consult with diabetic coordinator for diabetes education.  3.  Diabetic neuropathy --cont Lyrica  4.  Hypertension -Blood pressure noted to be borderline intermittently -Continue Lopressor. --Hold cozzar  5.  Hyperlipidemia -Statin.  6.  Tobacco abuse -Tobacco cessation stressed to patient.   --cont Nicotine patch  Constipation --consider scheduled MIralax   DVT prophylaxis: SCDs Code Status: Full Family Communication:  Disposition:   Status is: Inpatient  Dispo: The patient is from:  Home              Anticipated d/c is to: per primary team         Consultants:   [facility] hospitalist: Dr. Adela Glimpse 08/26/2020    Procedures:   CT abdomen and pelvis 08/26/2020  Status post CT-guided placement of 8.5 French pigtail drainage catheter into right lower quadrant periappendiceal phlegmon per Dr. Loreta Ave IR 08/27/2020  Antimicrobials:   IV Zosyn 08/26/2020>>>>>   Subjective: BG dropped to 68 early this morning.  Pt was seen trying to drink his bowel prep.     Objective: Vitals:   08/31/20 0756 08/31/20 1533 08/31/20 2003 08/31/20 2326  BP: 135/75 (!) 141/78 (!) 142/82 140/81  Pulse: 78 73 77 76  Resp: 16 18 18 20   Temp: 99 F (37.2 C) 99.3 F (37.4 C) 98.8 F (37.1 C) 98.7 F (37.1 C)  TempSrc: Oral Oral    SpO2: 95% 96% 98% 95%  Weight:      Height:        Intake/Output Summary (Last 24 hours) at 09/01/2020 0130 Last data filed at 08/31/2020 1853 Gross per 24 hour  Intake 384.7 ml  Output 330 ml  Net 54.7 ml   Filed Weights   08/26/20 1933 08/26/20 2048  Weight: 86.2 kg 81.6 kg    Examination:  Constitutional: NAD, AAOx3 HEENT: conjunctivae and lids normal, EOMI CV: No cyanosis.   RESP: normal respiratory effort, on RA Extremities: No effusions, edema in BLE SKIN: warm, dry Neuro: II - XII grossly intact.     Data Reviewed: I have personally reviewed following labs and imaging studies  CBC: Recent Labs  Lab 08/27/20 0101 08/27/20 1826 08/28/20 0550 08/29/20 0427 08/30/20 0421  WBC 13.2* 10.1 10.4 12.5* 10.7*  NEUTROABS 11.2*  --   --  9.9*  --   HGB 14.2 14.2 12.8* 12.9* 12.7*  HCT 40.7 41.5 37.7* 36.7* 37.2*  MCV 84.4 85.2 84.5 84.2 83.4  PLT 214 208 195 207 224    Basic Metabolic Panel: Recent Labs  Lab 08/27/20 0101 08/27/20 1826 08/28/20 0550 08/29/20 0427 08/30/20 0421 08/31/20 0430  NA 137 136 136 138 138  --   K 4.0 3.8 3.4* 3.2* 3.3*  --   CL 100 100 102 103 103  --   CO2 26 24 26 25 25   --   GLUCOSE 263* 189* 226*  92 82  --   BUN 19 19 23 13 12   --   CREATININE 1.12 1.07 1.26* 1.08 0.89  --   CALCIUM 8.8* 8.8* 8.3* 8.0* 8.1*  --   MG 1.8  --   --  1.7 1.8 1.9    GFR: Estimated Creatinine Clearance: 69.4 mL/min (by C-G formula based on SCr of 0.89 mg/dL).  Liver Function Tests: Recent Labs  Lab 08/26/20 1529 08/27/20 0101  AST 21 20  ALT 28 27  ALKPHOS 78 76  BILITOT 1.2 1.2  PROT 8.0 7.6  ALBUMIN 3.3* 2.9*    CBG: Recent Labs  Lab 08/31/20 0757 08/31/20 1150 08/31/20 1635 08/31/20 2124 08/31/20 2331  GLUCAP 129* 91 82 87 104*     Recent Results (from the past 240 hour(s))  Resp Panel by RT-PCR (Flu A&B, Covid) Nasopharyngeal Swab     Status: None   Collection Time: 08/26/20  4:49 PM   Specimen: Nasopharyngeal Swab; Nasopharyngeal(NP) swabs in vial transport medium  Result Value Ref Range Status   SARS Coronavirus 2 by RT PCR NEGATIVE NEGATIVE Final    Comment: (NOTE) SARS-CoV-2 target nucleic acids are NOT DETECTED.  The SARS-CoV-2 RNA is generally detectable in upper respiratory specimens during the acute phase of infection. The lowest concentration of SARS-CoV-2 viral copies this assay can detect is 138 copies/mL. A negative result does not preclude SARS-Cov-2 infection and should not be used as the sole basis for treatment or other patient management decisions. A negative result may occur with  improper specimen collection/handling, submission of specimen other than nasopharyngeal swab, presence of viral mutation(s) within the areas targeted by this assay, and inadequate number of viral copies(<138 copies/mL). A negative result must be combined with clinical observations, patient history, and epidemiological information. The expected result is Negative.  Fact Sheet for Patients:  10/31/20  Fact Sheet for Healthcare Providers:  10/26/20  This test is no t yet approved or cleared by the [employer] FDA and  has been authorized for detection and/or diagnosis of SARS-CoV-2 by FDA under an Emergency Use Authorization (EUA). This EUA will remain  in effect (meaning this test can be used) for the duration of the COVID-19 declaration under Section 564(b)(1) of the Act, 21 U.S.C.section 360bbb-3(b)(1), unless the authorization is terminated  or revoked sooner.       Influenza A by PCR NEGATIVE NEGATIVE Final   Influenza B by PCR NEGATIVE NEGATIVE Final    Comment: (NOTE) The Xpert Xpress SARS-CoV-2/FLU/RSV plus assay is intended as an aid in the diagnosis of influenza from Nasopharyngeal swab specimens and should not be used as a sole basis for treatment. Nasal washings and aspirates are unacceptable for Xpert Xpress SARS-CoV-2/FLU/RSV testing.  Fact  Sheet for Patients: [employer]  Fact Sheet for Healthcare Providers: [employer]  This test is not yet approved or cleared by the [place] FDA and has been authorized for detection and/or diagnosis of SARS-CoV-2 by FDA under an Emergency Use Authorization (EUA). This EUA will remain in effect (meaning this test can be used) for the duration of the COVID-19 declaration under Section 564(b)(1) of the Act, 21 U.S.C. section 360bbb-3(b)(1), unless the authorization is terminated or revoked.  Performed at [facility], [address] [place], [place] [address]   Aerobic/Anaerobic Culture (surgical/deep wound)     Status: None (Preliminary result)   Collection Time: 08/27/20  3:54 PM   Specimen: Abdomen; Abscess  Result Value Ref Range Status   Specimen Description   Final    ABDOMEN Performed at [facility], [address] [place], [place] [address]    Special Requests   Final    NONE Performed at [facility], [address] [place], [place] [address]    Gram Stain   Final    MODERATE WBC PRESENT, PREDOMINANTLY  PMN ABUNDANT GRAM POSITIVE COCCI ABUNDANT GRAM POSITIVE RODS Performed at [facility], [address], [place], [place] [address]    Culture   Final    RARE ESCHERICHIA COLI MODERATE STREPTOCOCCUS ANGINOSIS NO ANAEROBES ISOLATED; CULTURE IN PROGRESS FOR 5 DAYS    Report Status PENDING  Incomplete   Organism ID, Bacteria ESCHERICHIA COLI  Final   Organism ID, Bacteria STREPTOCOCCUS ANGINOSIS  Final      Susceptibility   Escherichia coli - MIC*    AMPICILLIN >=32 RESISTANT Resistant     CEFAZOLIN <=4 SENSITIVE Sensitive     CEFEPIME <=0.12 SENSITIVE Sensitive     CEFTAZIDIME <=1 SENSITIVE Sensitive     CEFTRIAXONE <=0.25 SENSITIVE Sensitive     CIPROFLOXACIN <=0.25 SENSITIVE Sensitive     GENTAMICIN <=1 SENSITIVE Sensitive     IMIPENEM <=0.25 SENSITIVE Sensitive     TRIMETH/SULFA <=20 SENSITIVE Sensitive     AMPICILLIN/SULBACTAM 16 INTERMEDIATE Intermediate     PIP/TAZO <=4 SENSITIVE Sensitive     * RARE ESCHERICHIA COLI   Streptococcus anginosis - MIC*    PENICILLIN 0.12 SENSITIVE Sensitive     CEFTRIAXONE 0.5 SENSITIVE Sensitive     ERYTHROMYCIN 2 RESISTANT Resistant     LEVOFLOXACIN 0.5 SENSITIVE Sensitive     VANCOMYCIN 0.5 SENSITIVE Sensitive     * MODERATE STREPTOCOCCUS ANGINOSIS  Culture, blood (routine x 2)     Status: None (Preliminary result)   Collection Time: 08/27/20  6:10 PM   Specimen: BLOOD  Result Value Ref Range Status   Specimen Description BLOOD BLOOD LEFT HAND  Final   Special Requests   Final    BOTTLES DRAWN AEROBIC AND ANAEROBIC Blood Culture adequate volume   Culture   Final    NO GROWTH 4 DAYS Performed at [facility], [address] [place], [place] [address]    Report Status PENDING  Incomplete  Culture, blood (routine x 2)     Status: None (Preliminary result)   Collection Time: 08/27/20  6:26 PM   Specimen: BLOOD  Result Value Ref Range Status   Specimen Description BLOOD LEFT ANTECUBITAL  Final   Special Requests    Final    BOTTLES DRAWN AEROBIC AND ANAEROBIC Blood Culture adequate volume   Culture   Final    NO GROWTH 4 DAYS Performed at [facility], [address] [place], [place]  [id number]    Report Status PENDING  Incomplete         Radiology Studies: No results found.      Scheduled Meds: . atorvastatin  10 mg Oral QHS  . insulin aspart  0-15 Units Subcutaneous TID WC  . insulin glargine  24 Units Subcutaneous QHS  . metoprolol tartrate  25 mg Oral BID  . metroNIDAZOLE  500 mg Oral Q8H  . nicotine  7 mg Transdermal Daily  . ondansetron (ZOFRAN) IV  4 mg Intravenous Once  . pantoprazole (PROTONIX) IV  40 mg Intravenous QHS  . pregabalin  150 mg Oral QHS  . sodium chloride flush  5 mL Intracatheter Q8H  . tiZANidine  2 mg Oral QHS   Continuous Infusions: . sodium chloride 250 mL (08/29/20 0020)  . piperacillin-tazobactam (ZOSYN)  IV 3.375 g (09/01/20 0110)     LOS: 6 days    Darlin Priestlyina Gladyce Mcray, MD [facility]   To contact the attending provider between 7A-7P or the covering provider during after hours 7P-7A, please log into the web site [email or web address] and access using universal [place] password for that web site. If you do not have the password, please call the hospital operator.  09/01/2020, 1:30 AM

## 2020-08-31 NOTE — Progress Notes (Addendum)
Pt noted to diaphoretic voiced that he is having discomfort to abdomen   and nausea, oriented *4. Voiced that he cannot tolerate anymore of the bowell prep vital signs  done redone stable blood sugar done 101 mg/dl. Refuse analgesic. Observation continues.on call doc informed.

## 2020-08-31 NOTE — Progress Notes (Signed)
Pt random blood sugar 68 mg/dl tolerated juice [id number], blood sugar 15 mins post  91mg /dl. Observation continues.

## 2020-09-01 ENCOUNTER — Encounter
Admission: EM | Disposition: A | Discharge status: Home Health | Payer: Self-pay | Source: Home / Self Care | Attending: Surgery

## 2020-09-01 ENCOUNTER — Inpatient Hospital Stay: Payer: Medicare Other | Admitting: Anesthesiology

## 2020-09-01 ENCOUNTER — Encounter: Payer: Self-pay | Admitting: Surgery

## 2020-09-01 ENCOUNTER — Inpatient Hospital Stay: Payer: Self-pay

## 2020-09-01 DIAGNOSIS — Z5331 Laparoscopic surgical procedure converted to open procedure: Secondary | ICD-10-CM | POA: Diagnosis not present

## 2020-09-01 DIAGNOSIS — I10 Essential (primary) hypertension: Secondary | ICD-10-CM | POA: Diagnosis not present

## 2020-09-01 DIAGNOSIS — K3532 Acute appendicitis with perforation and localized peritonitis, without abscess: Secondary | ICD-10-CM | POA: Diagnosis not present

## 2020-09-01 DIAGNOSIS — K3533 Acute appendicitis with perforation and localized peritonitis, with abscess: Secondary | ICD-10-CM | POA: Diagnosis not present

## 2020-09-01 DIAGNOSIS — E119 Type 2 diabetes mellitus without complications: Secondary | ICD-10-CM | POA: Diagnosis not present

## 2020-09-01 DIAGNOSIS — E785 Hyperlipidemia, unspecified: Secondary | ICD-10-CM | POA: Diagnosis not present

## 2020-09-01 HISTORY — PX: PARTIAL COLECTOMY: SHX5273

## 2020-09-01 HISTORY — PX: LAPAROSCOPY: SHX197

## 2020-09-01 LAB — COMPREHENSIVE METABOLIC PANEL
ALT: 38 U/L (ref 0–44)
AST: 43 U/L — ABNORMAL HIGH (ref 15–41)
Albumin: 2.7 g/dL — ABNORMAL LOW (ref 3.5–5.0)
Alkaline Phosphatase: 84 U/L (ref 38–126)
Anion gap: 19 — ABNORMAL HIGH (ref 5–15)
BUN: 13 mg/dL (ref 8–23)
CO2: 20 mmol/L — ABNORMAL LOW (ref 22–32)
Calcium: 8.9 mg/dL (ref 8.9–10.3)
Chloride: 101 mmol/L (ref 98–111)
Creatinine, Ser: 1.05 mg/dL (ref 0.61–1.24)
GFR, Estimated: >60 mL/min (ref >60)
Glucose, Bld: 130 mg/dL — ABNORMAL HIGH (ref 70–99)
Potassium: 3.4 mmol/L — ABNORMAL LOW (ref 3.5–5.1)
Sodium: 140 mmol/L (ref 135–145)
Total Bilirubin: 1.6 mg/dL — ABNORMAL HIGH (ref 0.3–1.2)
Total Protein: 7.8 g/dL (ref 6.5–8.1)

## 2020-09-01 LAB — CULTURE, BLOOD (ROUTINE X 2)
Culture: NO GROWTH
Culture: NO GROWTH
Special Requests: ADEQUATE
Special Requests: ADEQUATE

## 2020-09-01 LAB — CBC
HCT: 45.1 % (ref 39.0–52.0)
Hemoglobin: 14.9 g/dL (ref 13.0–17.0)
MCH: 28.3 pg (ref 26.0–34.0)
MCHC: 33 g/dL (ref 30.0–36.0)
MCV: 85.6 fL (ref 80.0–100.0)
Platelets: 353 10*3/uL (ref 150–400)
RBC: 5.27 MIL/uL (ref 4.22–5.81)
RDW: 13.8 % (ref 11.5–15.5)
WBC: 12.4 10*3/uL — ABNORMAL HIGH (ref 4.0–10.5)
nRBC: 0 % (ref 0.0–0.2)

## 2020-09-01 LAB — AEROBIC/ANAEROBIC CULTURE W GRAM STAIN (SURGICAL/DEEP WOUND)

## 2020-09-01 LAB — GLUCOSE, CAPILLARY
Glucose-Capillary: 185 mg/dL — ABNORMAL HIGH (ref 70–99)
Glucose-Capillary: 174 mg/dL — ABNORMAL HIGH (ref 70–99)
Glucose-Capillary: 182 mg/dL — ABNORMAL HIGH (ref 70–99)
Glucose-Capillary: 196 mg/dL — ABNORMAL HIGH (ref 70–99)

## 2020-09-01 LAB — MAGNESIUM: Magnesium: 2.2 mg/dL (ref 1.7–2.4)

## 2020-09-01 SURGERY — COLECTOMY, PARTIAL
Anesthesia: General

## 2020-09-01 MED ORDER — ONDANSETRON HCL 4 MG/2ML IJ SOLN
INTRAMUSCULAR | Status: DC | PRN
  Administered 2020-09-01: 4 mg via INTRAVENOUS

## 2020-09-01 MED ORDER — SUCCINYLCHOLINE CHLORIDE 200 MG/10ML IV SOSY
PREFILLED_SYRINGE | INTRAVENOUS | Status: AC
  Filled 2020-09-01: qty 10

## 2020-09-01 MED ORDER — PHENYLEPHRINE HCL (PRESSORS) 10 MG/ML IV SOLN
INTRAVENOUS | Status: DC | PRN
  Administered 2020-09-01 (×3): 100 ug via INTRAVENOUS

## 2020-09-01 MED ORDER — LACTATED RINGERS IV SOLN: INTRAVENOUS | Status: DC | PRN

## 2020-09-01 MED ORDER — BUPIVACAINE-EPINEPHRINE (PF) 0.5% -1:200000 IJ SOLN
INTRAMUSCULAR | Status: DC | PRN
  Administered 2020-09-01: 10 mL
  Administered 2020-09-01: 20 mL

## 2020-09-01 MED ORDER — SUGAMMADEX SODIUM 200 MG/2ML IV SOLN
INTRAVENOUS | Status: DC | PRN
  Administered 2020-09-01: 200 mg via INTRAVENOUS

## 2020-09-01 MED ORDER — LIDOCAINE HCL (PF) 2 % IJ SOLN
INTRAMUSCULAR | Status: AC
  Filled 2020-09-01: qty 5

## 2020-09-01 MED ORDER — BUPIVACAINE LIPOSOME 1.3 % IJ SUSP
INTRAMUSCULAR | Status: AC
  Filled 2020-09-01: qty 20

## 2020-09-01 MED ORDER — ACETAMINOPHEN 10 MG/ML IV SOLN
INTRAVENOUS | Status: AC
  Filled 2020-09-01: qty 100

## 2020-09-01 MED ORDER — ONDANSETRON HCL 4 MG/2ML IJ SOLN
INTRAMUSCULAR | Status: AC
  Filled 2020-09-01: qty 2

## 2020-09-01 MED ORDER — ONDANSETRON HCL 4 MG/2ML IJ SOLN: 4.0000 mg | Freq: Once | INTRAMUSCULAR | Status: DC | PRN

## 2020-09-01 MED ORDER — DEXAMETHASONE SODIUM PHOSPHATE 10 MG/ML IJ SOLN
INTRAMUSCULAR | Status: AC
  Filled 2020-09-01: qty 1

## 2020-09-01 MED ORDER — FENTANYL CITRATE (PF) 100 MCG/2ML IJ SOLN
INTRAMUSCULAR | Status: AC
  Filled 2020-09-01: qty 2

## 2020-09-01 MED ORDER — BUPIVACAINE-EPINEPHRINE (PF) 0.25% -1:200000 IJ SOLN
INTRAMUSCULAR | Status: AC
  Filled 2020-09-01: qty 30

## 2020-09-01 MED ORDER — ROCURONIUM BROMIDE 10 MG/ML (PF) SYRINGE
PREFILLED_SYRINGE | INTRAVENOUS | Status: AC
  Filled 2020-09-01: qty 20

## 2020-09-01 MED ORDER — ACETAMINOPHEN 10 MG/ML IV SOLN
INTRAVENOUS | Status: DC | PRN
  Administered 2020-09-01: 1000 mg via INTRAVENOUS

## 2020-09-01 MED ORDER — LIDOCAINE HCL (CARDIAC) PF 100 MG/5ML IV SOSY
PREFILLED_SYRINGE | INTRAVENOUS | Status: DC | PRN
  Administered 2020-09-01: 80 mg via INTRAVENOUS

## 2020-09-01 MED ORDER — POTASSIUM CHLORIDE 10 MEQ/100ML IV SOLN: 10.0000 meq | INTRAVENOUS | Status: DC

## 2020-09-01 MED ORDER — DEXMEDETOMIDINE (PRECEDEX) IN NS 20 MCG/5ML (4 MCG/ML) IV SYRINGE
PREFILLED_SYRINGE | INTRAVENOUS | Status: DC | PRN
  Administered 2020-09-01: 8 ug via INTRAVENOUS

## 2020-09-01 MED ORDER — METOPROLOL TARTRATE 5 MG/5ML IV SOLN
INTRAVENOUS | Status: AC
  Filled 2020-09-01: qty 5

## 2020-09-01 MED ORDER — DEXAMETHASONE SODIUM PHOSPHATE 10 MG/ML IJ SOLN
INTRAMUSCULAR | Status: DC | PRN
  Administered 2020-09-01: 4 mg via INTRAVENOUS

## 2020-09-01 MED ORDER — HYDROMORPHONE HCL 1 MG/ML IJ SOLN
INTRAMUSCULAR | Status: DC | PRN
  Administered 2020-09-01 (×2): .5 mg via INTRAVENOUS

## 2020-09-01 MED ORDER — SUCCINYLCHOLINE CHLORIDE 20 MG/ML IJ SOLN
INTRAMUSCULAR | Status: DC | PRN
  Administered 2020-09-01: 120 mg via INTRAVENOUS

## 2020-09-01 MED ORDER — KETOROLAC TROMETHAMINE 30 MG/ML IJ SOLN
15.0000 mg | Freq: Four times a day (QID) | INTRAMUSCULAR | Status: AC | PRN
  Administered 2020-09-04: 15 mg via INTRAVENOUS
  Filled 2020-09-01: qty 1

## 2020-09-01 MED ORDER — BUPIVACAINE-EPINEPHRINE (PF) 0.5% -1:200000 IJ SOLN
INTRAMUSCULAR | Status: AC
  Filled 2020-09-01: qty 30

## 2020-09-01 MED ORDER — BUPIVACAINE LIPOSOME 1.3 % IJ SUSP
INTRAMUSCULAR | Status: DC | PRN
  Administered 2020-09-01: 20 mL

## 2020-09-01 MED ORDER — SODIUM CHLORIDE (PF) 0.9 % IJ SOLN
INTRAMUSCULAR | Status: DC | PRN
  Administered 2020-09-01: 30 mL

## 2020-09-01 MED ORDER — POTASSIUM CHLORIDE 10 MEQ/100ML IV SOLN
10.0000 meq | INTRAVENOUS | Status: DC
  Administered 2020-09-01 (×2): 10 meq via INTRAVENOUS
  Filled 2020-09-01: qty 100

## 2020-09-01 MED ORDER — PIPERACILLIN-TAZOBACTAM 3.375 G IVPB
INTRAVENOUS | Status: AC
  Filled 2020-09-01: qty 50

## 2020-09-01 MED ORDER — ROCURONIUM BROMIDE 100 MG/10ML IV SOLN
INTRAVENOUS | Status: DC | PRN
  Administered 2020-09-01: 10 mg via INTRAVENOUS
  Administered 2020-09-01: 20 mg via INTRAVENOUS
  Administered 2020-09-01: 10 mg via INTRAVENOUS
  Administered 2020-09-01: 50 mg via INTRAVENOUS
  Administered 2020-09-01: 20 mg via INTRAVENOUS
  Administered 2020-09-01: 10 mg via INTRAVENOUS

## 2020-09-01 MED ORDER — FENTANYL CITRATE (PF) 100 MCG/2ML IJ SOLN
INTRAMUSCULAR | Status: DC | PRN
  Administered 2020-09-01 (×4): 50 ug via INTRAVENOUS

## 2020-09-01 MED ORDER — CHLORHEXIDINE GLUCONATE CLOTH 2 % EX PADS: 6.0000 | MEDICATED_PAD | Freq: Every day | CUTANEOUS | Status: DC

## 2020-09-01 MED ORDER — METOPROLOL TARTRATE 5 MG/5ML IV SOLN
5.0000 mg | Freq: Four times a day (QID) | INTRAVENOUS | Status: DC
  Administered 2020-09-02 – 2020-09-11 (×35): 5 mg via INTRAVENOUS
  Filled 2020-09-01 (×38): qty 5

## 2020-09-01 MED ORDER — PROPOFOL 10 MG/ML IV BOLUS
INTRAVENOUS | Status: DC | PRN
  Administered 2020-09-01: 150 mg via INTRAVENOUS

## 2020-09-01 MED ORDER — MORPHINE SULFATE (PF) 4 MG/ML IV SOLN: 2.0000 mg | INTRAVENOUS | Status: DC | PRN

## 2020-09-01 MED ORDER — SEVOFLURANE IN SOLN
RESPIRATORY_TRACT | Status: AC
  Filled 2020-09-01: qty 250

## 2020-09-01 MED ORDER — HYDROMORPHONE HCL 1 MG/ML IJ SOLN
INTRAMUSCULAR | Status: AC
  Filled 2020-09-01: qty 1

## 2020-09-01 MED ORDER — SODIUM CHLORIDE 0.9 % IV SOLN: INTRAVENOUS | Status: DC | PRN

## 2020-09-01 MED ORDER — METOPROLOL TARTRATE 5 MG/5ML IV SOLN
INTRAVENOUS | Status: DC | PRN
  Administered 2020-09-01: 1 mg via INTRAVENOUS
  Administered 2020-09-01: 2.5 mg via INTRAVENOUS
  Administered 2020-09-01: 1.5 mg via INTRAVENOUS

## 2020-09-01 SURGICAL SUPPLY — 55 items
ADH SKN CLS APL DERMABOND .7 (GAUZE/BANDAGES/DRESSINGS) ×1
APL PRP STRL LF DISP 70% ISPRP (MISCELLANEOUS) ×1
BAG SPEC RTRVL LRG 6X4 10 (ENDOMECHANICALS) ×1
BLADE SURG SZ11 CARB STEEL (BLADE) ×2 IMPLANT
BULB RESERV EVAC DRAIN JP 100C (MISCELLANEOUS) ×2 IMPLANT
CHLORAPREP W/TINT 26 (MISCELLANEOUS) ×2 IMPLANT
COVER WAND RF STERILE (DRAPES) ×2 IMPLANT
DERMABOND ADVANCED (GAUZE/BANDAGES/DRESSINGS) ×1
DERMABOND ADVANCED .7 DNX12 (GAUZE/BANDAGES/DRESSINGS) ×1 IMPLANT
DRAIN CHANNEL 19F RND (DRAIN) ×2 IMPLANT
DRSG OPSITE POSTOP 4X10 (GAUZE/BANDAGES/DRESSINGS) ×2 IMPLANT
DRSG TEGADERM 4X4.75 (GAUZE/BANDAGES/DRESSINGS) ×2 IMPLANT
ELECT BLADE 6.5 EXT (BLADE) ×2 IMPLANT
ELECT CAUTERY BLADE 6.4 (BLADE) ×2 IMPLANT
ELECT REM PT RETURN 9FT ADLT (ELECTROSURGICAL) ×2
ELECTRODE REM PT RTRN 9FT ADLT (ELECTROSURGICAL) ×1 IMPLANT
GAUZE SPONGE 4X4 12PLY STRL (GAUZE/BANDAGES/DRESSINGS) ×2 IMPLANT
GLOVE SURG SYN 7.0 (GLOVE) ×2 IMPLANT
GLOVE SURG SYN 7.5  E (GLOVE) ×1
GLOVE SURG SYN 7.5 E (GLOVE) ×1 IMPLANT
GOWN STRL REUS W/ TWL LRG LVL3 (GOWN DISPOSABLE) ×2 IMPLANT
GOWN STRL REUS W/TWL LRG LVL3 (GOWN DISPOSABLE) ×4
IRRIGATION STRYKERFLOW (MISCELLANEOUS) ×1 IMPLANT
IRRIGATOR STRYKERFLOW (MISCELLANEOUS) ×2
IV NS 1000ML (IV SOLUTION) ×2
IV NS 1000ML BAXH (IV SOLUTION) ×1 IMPLANT
KIT TURNOVER KIT A (KITS) ×2 IMPLANT
LABEL OR SOLS (LABEL) ×2 IMPLANT
LIGASURE LAP MARYLAND 5MM 37CM (ELECTROSURGICAL) ×2 IMPLANT
MANIFOLD NEPTUNE II (INSTRUMENTS) ×2 IMPLANT
NEEDLE HYPO 22GX1.5 SAFETY (NEEDLE) ×2 IMPLANT
NS IRRIG 500ML POUR BTL (IV SOLUTION) ×2 IMPLANT
PACK COLON CLEAN CLOSURE (MISCELLANEOUS) ×2 IMPLANT
PACK LAP CHOLECYSTECTOMY (MISCELLANEOUS) ×2 IMPLANT
PENCIL ELECTRO HAND CTR (MISCELLANEOUS) ×2 IMPLANT
POUCH SPECIMEN RETRIEVAL 10MM (ENDOMECHANICALS) ×2 IMPLANT
RELOAD PROXIMATE 75MM BLUE (ENDOMECHANICALS) ×6 IMPLANT
SCISSORS METZENBAUM CVD 33 (INSTRUMENTS) ×2 IMPLANT
SET TUBE SMOKE EVAC HIGH FLOW (TUBING) ×2 IMPLANT
SLEEVE ADV FIXATION 5X100MM (TROCAR) ×4 IMPLANT
SPONGE LAP 18X18 RF (DISPOSABLE) ×2 IMPLANT
STAPLER PROXIMATE 75MM BLUE (STAPLE) ×2 IMPLANT
STAPLER SKIN PROX 35W (STAPLE) ×2 IMPLANT
SUT ETHILON 3-0 FS-10 30 BLK (SUTURE) ×2
SUT MNCRL 4-0 (SUTURE) ×2
SUT MNCRL 4-0 27XMFL (SUTURE) ×1
SUT PDS AB 1 CT1 27 (SUTURE) ×4 IMPLANT
SUT VICRYL 0 AB UR-6 (SUTURE) ×4 IMPLANT
SUTURE EHLN 3-0 FS-10 30 BLK (SUTURE) ×1 IMPLANT
SUTURE MNCRL 4-0 27XMF (SUTURE) ×1 IMPLANT
SYS KII FIOS ACCESS ABD 5X100 (TROCAR) ×2
SYSTEM KII FIOS ACES ABD 5X100 (TROCAR) ×1 IMPLANT
TRAY FOLEY MTR SLVR 16FR STAT (SET/KITS/TRAYS/PACK) ×2 IMPLANT
TROCAR BALLN GELPORT 12X130M (ENDOMECHANICALS) ×2 IMPLANT
TUBING EVAC SMOKE HEATED PNEUM (TUBING) ×2 IMPLANT

## 2020-09-01 NOTE — Anesthesia Preprocedure Evaluation (Signed)
Anesthesia Evaluation  Patient identified by MRN, date of birth, ID band Patient awake    Reviewed: Allergy & Precautions, H&P , NPO status , Patient's Chart, lab work & pertinent test results  History of Anesthesia Complications Negative for: history of anesthetic complications  Airway Mallampati: III  TM Distance: >3 FB     Dental  (+) Chipped   Pulmonary neg sleep apnea, neg COPD, Current Smoker,    breath sounds clear to auscultation       Cardiovascular hypertension, (-) angina(-) Past MI and (-) Cardiac Stents (-) dysrhythmias  Rhythm:regular Rate:Normal     Neuro/Psych negative neurological ROS  negative psych ROS   GI/Hepatic Neg liver ROS, Ruptured appendix s/p percutaneous drain with IR, failed conservative management   Endo/Other  diabetes  Renal/GU      Musculoskeletal   Abdominal   Peds  Hematology negative hematology ROS (+)   Anesthesia Other Findings Past Medical History: No date: Arthritis     Comment:  rt shoulder No date: Diabetes mellitus without complication (HCC) No date: Hyperlipidemia 06/02/2015: Hyperlipidemia associated with type 2 diabetes mellitus  (HCC) No date: Hypertension  Past Surgical History: No date: CYST REMOVAL TRUNK     Comment:  2x 2009: SHOULDER SURGERY; Right     Comment:  rotator cuff surgery  BMI    Body Mass Index: 27.35 kg/m      Reproductive/Obstetrics negative OB ROS                             Anesthesia Physical Anesthesia Plan  ASA: III  Anesthesia Plan: General ETT and Rapid Sequence   Post-op Pain Management:    Induction:   PONV Risk Score and Plan: Ondansetron, Dexamethasone and Treatment may vary due to age or medical condition  Airway Management Planned:   Additional Equipment:   Intra-op Plan:   Post-operative Plan:   Informed Consent: I have reviewed the patients History and Physical, chart, labs and  discussed the procedure including the risks, benefits and alternatives for the proposed anesthesia with the patient or authorized representative who has indicated his/her understanding and acceptance.     Dental Advisory Given  Plan Discussed with: Anesthesiologist, CRNA and Surgeon  Anesthesia Plan Comments:         Anesthesia Quick Evaluation

## 2020-09-01 NOTE — Op Note (Signed)
Procedure Date:  09/01/2020  Pre-operative Diagnosis:  Ruptured appendicitis with intra-abdominal abscess  Post-operative Diagnosis:   Ruptured appendicitis with intra-abdominal abscess  Procedure:   1.  Diagnostic laparoscopy 2.  Open Right Colectomy with ileocolonic anastomosis  Surgeon:  Howie Ill, MD  Assistant:  Lynden Oxford, PA-C; Karlene Lineman, PA-S  Anesthesia:  General endotracheal  Estimated Blood Loss:  200 ml  Specimens:  Right colon with terminal ileum and appendix  Complications:  None  Findings:  The patient had an intra-abdominal abscess that was not drained by his percutaneous drain, and there was significant inflammatory response in the pelvis with edematous terminal ileum and appendix all the way to the base of the appendix, which was adjacent to the ileocecal valve.  Indications for Procedure:  This is a 75 y.o. male who presents with ruptured appendicitis with abscess, s/p percutaneous drainage.  However, clinically he was not improving and there was a deeper abscess that was not being drained.  It was decided to take him to the OR for diagnostic laparoscopy with possible appendectomy vs possible right colectomy.  The benefits, complications, treatment options, and expected outcomes were discussed with the patient. The risks of bleeding, infection, bowel injury, and need for further procedures were all discussed with the patient and he was willing to proceed.  Description of Procedure: The patient was correctly identified in the preoperative area and brought into the operating room.  The patient was placed supine with VTE prophylaxis in place.  Appropriate time-outs were performed.  Anesthesia was induced and the patient was intubated.  Foley catheter was placed.  Appropriate antibiotics were infused.  The abdomen was prepped and draped in a sterile fashion. A supraumbilical incision was made. A cutdown technique was used to enter the abdominal cavity  without injury, and a Hasson trocar was inserted.  Pneumoperitoneum was obtained with appropriate opening pressures.  Two 5-mm ports were placed in the low abdomen and left lower quadrant under direct visualization.   The right lower quadrant was evaluated.  There was significant inflammatory response involving the appendix and terminal ileum, with significant adhesions between the two and the anterior abdominal wall.  Suction irrigator was used to help dissect around the appendix, revealing the abscess cavity where the percutaneous drain was located, as well as a deeper RLQ / pelvic abscess.  However, the amount of inflammation and adhesions made it difficult to dissect around the tissues safely.  Furthermore, the base of the appendix was also inflamed and adjacent to the ileocecal valve.  It was decided to then proceed with an open right colectomy.  Pneumoperitoneum was released.  The supraumbilical incision was extended superiorly and inferiorly.  Cautery was used to dissect through the subcutaneous tissue down to the fascia.  The supraumbilical incision was used as starting point to open the fascia as well.  Once exposed, Balfour retractor was placed and the abdomen was explored.  The small bowel loops were swept towards the left side and held in place with laparotomy pads.  Blunt dissection was used to complete the dissection of the appendix and abscess cavity in order to free up the appendix.  Then, we proceeded to mobilize the right colon along the White line of Toldt.  We proceeded this proximally and distally past the hepatic flexure and continued our mobilization to the mid transverse colon.  The right ureter was identified and preserved.  The terminal ileum was then also mobilized.  Windows were created in the mesentery of both  terminal ileum and proximal transverse colon and blue load GIA 75 mm staplers fired.  LigaSure was used to take down the mesentery and used 2-0 silk tie to ligate the ileocolic  pedicle.  The specimen then came off en bloc including terminal ileum, appendix, right colon, and proximal transverse colon.  This was sent out to pathology.  Then the blind ends of our staple lines were put together and lined up at the antimesenteric borders and secured in place using two 3-0 silk sutures.  Corners of the staple lines were cut using cautery and a GIA 75 mm blue load stapler was used to create a common channel between terminal ileum and transverse colon.  The same stapler was then used to close the opening of the common channel.  3-0 silk sutures were used to imbricate the staple line for protection.  Also a 3-0 silk suture was used to close the mesenteric defect.    After this, we further inspected the abdominal cavity and make sure that there were no twists within the bowel and the mesentery.  The abdomen was thoroughly irrigated with warm saline in all quadrants and pelvis.  A 19 Fr. Blake drain was inserted in the LLQ going to the pelvis and then back towards the anastomosis.  Arista powder was used over the pelvis in the area of the abscesses to help with hemostasis of the raw areas.  80 mL total of Exparel solution with 0.5% bupivacaine and epi was then infiltrated in the peritoneum, fascia, and subcutaneous tissue.  The midline incision was then closed using #1 PDS suture x2.  All the wounds were then irrigated and closed using stapler.  All the wounds were then cleaned and dressed using honeycomb dressings.  The drain was dressed with 4x4 gauze and TegaDerm.   The patient was emerged from anesthesia and extubated and brought to the recovery room for further management.   The patient tolerated the procedure well and all counts were correct at the end of the case.    Howie Ill, MD

## 2020-09-01 NOTE — Transfer of Care (Signed)
Immediate Anesthesia Transfer of Care Note  Patient: William Parks  Procedure(s) Performed: LAPAROSCOPY DIAGNOSTIC (N/A ) PARTIAL RIGHT COLECTOMY (N/A )  Patient Location: PACU  Anesthesia Type:General  Level of Consciousness: awake  Airway & Oxygen Therapy: Patient connected to face mask oxygen  Post-op Assessment: Post -op Vital signs reviewed and stable  Post vital signs: stable  Last Vitals:  Vitals Value Taken Time  BP 132/65 09/01/20 2045  Temp    Pulse 76 09/01/20 2049  Resp 12 09/01/20 2049  SpO2 95 % 09/01/20 2049  Vitals shown include unvalidated device data.  Last Pain:  Vitals:   09/01/20 1317  TempSrc: Oral  PainSc: 0-No pain      Patients Stated Pain Goal:  (shaking subsided) (08/27/20 1640)  Complications: No complications documented.

## 2020-09-01 NOTE — Progress Notes (Signed)
Consult PROGRESS NOTE    William SakaiKirby Parks  ZOX:[id number] DOB: 01/05/1946 DOA: 08/26/2020 PCP: Alba CorySowles, Krichna, MD   Chief Complaint  Patient presents with  . Abdominal Pain    Brief Narrative:  75 yo M DM2, HTN, HLD, neuropothy came in with Right Lq pain 5 days no nausea voting or fever, not taking his insulin or BP meds for the past 3 days. He was too weak to even get to get his medicine.  Found to have perforated appendix, admitted under surgery service and we were consulted for diabetes management. Patient s/p percutaneous drain placement with IR.  Patient had poor response to percutaneous drainage, continued to have pain and a concern of new abscess formation. Going to the OR today for exploratory laparotomy and a possible partial/total colectomy.  Subjective: Patient continued to feel lethargic and right lower quadrant pain.  He was also asking for some ice chips as was feeling very thirsty.  Waiting for his surgery later today.  Assessment & Plan:   Active Problems:   Hypertension   Dyslipidemia   Ruptured appendicitis   Acute appendicitis with localized peritonitis and abscess   DM2 (diabetes mellitus, type 2) (HCC)   DM (diabetes mellitus), type 2, uncontrolled (HCC)   #1 perforated appendix with abscess -Patient noted to have a localized peritonitis and abscess formation per CT abdomen and pelvis scan,-Status post percutaneous drain placement per IR. -CT 5/6 found another developing abscess, appeared difficult to access with IR approach. -Going to the OR for exploratory laparotomy and possible right hemicolectomy or a total colectomy depending but they will find during surgery. -cont IV zosyn -Follow-up on surgical recommendations.  2.  Uncontrolled type 2 diabetes mellitus -Hemoglobin A1c 10.5 (08/27/2020).  CBG little elevated today as nighttime dose of Lantus was held due to surgery. -resume Lantus at reduced 24u after surgery --SSI TID, no night-time coverage -Consult  with diabetic coordinator for diabetes education.  3.  Diabetic neuropathy -cont Lyrica  4.  Hypertension.  Blood pressure within goal. -Continue Lopressor. -Keep holding Cozaar-can be restarted if needed.  5.  Hyperlipidemia -Continue statin.  6.  Tobacco abuse -Tobacco cessation stressed to patient.   --cont Nicotine patch  Constipation --consider scheduled MIralax   DVT prophylaxis: SCDs Code Status: Full Family Communication: Discussed with patient Disposition:   Status is: Inpatient  Dispo: The patient is from: Home              Anticipated d/c is to: per primary team         Consultants:   [facility] hospitalist: Dr. Adela Glimpseoutova 08/26/2020    Procedures:   CT abdomen and pelvis 08/26/2020  Status post CT-guided placement of 8.5 French pigtail drainage catheter into right lower quadrant periappendiceal phlegmon per Dr. Loreta AveWagner IR 08/27/2020  Antimicrobials:   IV Zosyn 08/26/2020>>>>>   Objective: Vitals:   08/31/20 2326 09/01/20 0358 09/01/20 0756 09/01/20 1317  BP: 140/81 133/75 129/77 (!) 141/78  Pulse: 76 88 82 83  Resp: 20 20 16 16   Temp: 98.7 F (37.1 C) 98.3 F (36.8 C) 98.6 F (37 C) 98.2 F (36.8 C)  TempSrc:    Oral  SpO2: 95% 100% 95% 96%  Weight:      Height:        Intake/Output Summary (Last 24 hours) at 09/01/2020 1323 Last data filed at 09/01/2020 0500 Gross per 24 hour  Intake 156.16 ml  Output 645 ml  Net -488.84 ml   Filed Weights   08/26/20 1933 08/26/20  2048  Weight: 86.2 kg 81.6 kg    Examination:  General.  Well-developed gentleman, in no acute distress. Pulmonary.  Lungs clear bilaterally, normal respiratory effort. CV.  Regular rate and rhythm, no JVD, rub or murmur. Abdomen.  Soft, mild right lower quadrant tenderness, nondistended, BS positive, abdominal drain with serosanguineous discharge. CNS.  Alert and oriented x3.  No focal neurologic deficit. Extremities.  No edema, no cyanosis, pulses intact and  symmetrical. Psychiatry.  Judgment and insight appears normal.  Data Reviewed: I have personally reviewed following labs and imaging studies  CBC: Recent Labs  Lab 08/27/20 0101 08/27/20 1826 08/28/20 0550 08/29/20 0427 08/30/20 0421 09/01/20 0341  WBC 13.2* 10.1 10.4 12.5* 10.7* 12.4*  NEUTROABS 11.2*  --   --  9.9*  --   --   HGB 14.2 14.2 12.8* 12.9* 12.7* 14.9  HCT 40.7 41.5 37.7* 36.7* 37.2* 45.1  MCV 84.4 85.2 84.5 84.2 83.4 85.6  PLT 214 208 195 207 224 353    Basic Metabolic Panel: Recent Labs  Lab 08/27/20 0101 08/27/20 1826 08/28/20 0550 08/29/20 0427 08/30/20 0421 08/31/20 0430 09/01/20 0341  NA 137 136 136 138 138  --  140  K 4.0 3.8 3.4* 3.2* 3.3*  --  3.4*  CL 100 100 102 103 103  --  101  CO2 26 24 26 25 25   --  20*  GLUCOSE 263* 189* 226* 92 82  --  130*  BUN 19 19 23 13 12   --  13  CREATININE 1.12 1.07 1.26* 1.08 0.89  --  1.05  CALCIUM 8.8* 8.8* 8.3* 8.0* 8.1*  --  8.9  MG 1.8  --   --  1.7 1.8 1.9 2.2    GFR: Estimated Creatinine Clearance: 58.8 mL/min (by C-G formula based on SCr of 1.05 mg/dL).  Liver Function Tests: Recent Labs  Lab 08/26/20 1529 08/27/20 0101 09/01/20 0341  AST 21 20 43*  ALT 28 27 38  ALKPHOS 78 76 84  BILITOT 1.2 1.2 1.6*  PROT 8.0 7.6 7.8  ALBUMIN 3.3* 2.9* 2.7*    CBG: Recent Labs  Lab 08/31/20 1635 08/31/20 2124 08/31/20 2331 09/01/20 0740 09/01/20 1221  GLUCAP 82 87 104* 185* 174*     Recent Results (from the past 240 hour(s))  Resp Panel by RT-PCR (Flu A&B, Covid) Nasopharyngeal Swab     Status: None   Collection Time: 08/26/20  4:49 PM   Specimen: Nasopharyngeal Swab; Nasopharyngeal(NP) swabs in vial transport medium  Result Value Ref Range Status   SARS Coronavirus 2 by RT PCR NEGATIVE NEGATIVE Final    Comment: (NOTE) SARS-CoV-2 target nucleic acids are NOT DETECTED.  The SARS-CoV-2 RNA is generally detectable in upper respiratory specimens during the acute phase of infection. The  lowest concentration of SARS-CoV-2 viral copies this assay can detect is 138 copies/mL. A negative result does not preclude SARS-Cov-2 infection and should not be used as the sole basis for treatment or other patient management decisions. A negative result may occur with  improper specimen collection/handling, submission of specimen other than nasopharyngeal swab, presence of viral mutation(s) within the areas targeted by this assay, and inadequate number of viral copies(<138 copies/mL). A negative result must be combined with clinical observations, patient history, and epidemiological information. The expected result is Negative.  Fact Sheet for Patients:  11/01/20  Fact Sheet for Healthcare Providers:  10/26/20  This test is no t yet approved or cleared by the [employer] FDA and  has  been authorized for detection and/or diagnosis of SARS-CoV-2 by FDA under an Emergency Use Authorization (EUA). This EUA will remain  in effect (meaning this test can be used) for the duration of the COVID-19 declaration under Section 564(b)(1) of the Act, 21 U.S.C.section 360bbb-3(b)(1), unless the authorization is terminated  or revoked sooner.       Influenza A by PCR NEGATIVE NEGATIVE Final   Influenza B by PCR NEGATIVE NEGATIVE Final    Comment: (NOTE) The Xpert Xpress SARS-CoV-2/FLU/RSV plus assay is intended as an aid in the diagnosis of influenza from Nasopharyngeal swab specimens and should not be used as a sole basis for treatment. Nasal washings and aspirates are unacceptable for Xpert Xpress SARS-CoV-2/FLU/RSV testing.  Fact Sheet for Patients: [employer]  Fact Sheet for Healthcare Providers: [employer]ious[employer]ker[employer]it  This test is not yet approved or cleared by the [place] FDA and has been authorized for detection and/or diagnosis of SARS-CoV-2 by FDA under  an Emergency Use Authorization (EUA). This EUA will remain in effect (meaning this test can be used) for the duration of the COVID-19 declaration under Section 564(b)(1) of the Act, 21 U.S.C. section 360bbb-3(b)(1), unless the authorization is terminated or revoked.  Performed at [facility], [address] [place], [place] [address]   Aerobic/Anaerobic Culture (surgical/deep wound)     Status: None (Preliminary result)   Collection Time: 08/27/20  3:54 PM   Specimen: Abdomen; Abscess  Result Value Ref Range Status   Specimen Description   Final    ABDOMEN Performed at [facility], [address] [place], [place] [address]    Special Requests   Final    NONE Performed at [facility], [address] [place], [place] [address]    Gram Stain   Final    MODERATE WBC PRESENT, PREDOMINANTLY PMN ABUNDANT GRAM POSITIVE COCCI ABUNDANT GRAM POSITIVE RODS Performed at [facility], [address], [place], [place] [address]    Culture   Final    RARE ESCHERICHIA COLI MODERATE STREPTOCOCCUS ANGINOSIS NO ANAEROBES ISOLATED; CULTURE IN PROGRESS FOR 5 DAYS    Report Status PENDING  Incomplete   Organism ID, Bacteria ESCHERICHIA COLI  Final   Organism ID, Bacteria STREPTOCOCCUS ANGINOSIS  Final      Susceptibility   Escherichia coli - MIC*    AMPICILLIN >=32 RESISTANT Resistant     CEFAZOLIN <=4 SENSITIVE Sensitive     CEFEPIME <=0.12 SENSITIVE Sensitive     CEFTAZIDIME <=1 SENSITIVE Sensitive     CEFTRIAXONE <=0.25 SENSITIVE Sensitive     CIPROFLOXACIN <=0.25 SENSITIVE Sensitive     GENTAMICIN <=1 SENSITIVE Sensitive     IMIPENEM <=0.25 SENSITIVE Sensitive     TRIMETH/SULFA <=20 SENSITIVE Sensitive     AMPICILLIN/SULBACTAM 16 INTERMEDIATE Intermediate     PIP/TAZO <=4 SENSITIVE Sensitive     * RARE ESCHERICHIA COLI   Streptococcus anginosis - MIC*    PENICILLIN 0.12 SENSITIVE Sensitive     CEFTRIAXONE 0.5 SENSITIVE Sensitive      ERYTHROMYCIN 2 RESISTANT Resistant     LEVOFLOXACIN 0.5 SENSITIVE Sensitive     VANCOMYCIN 0.5 SENSITIVE Sensitive     * MODERATE STREPTOCOCCUS ANGINOSIS  Culture, blood (routine x 2)     Status: None   Collection Time: 08/27/20  6:10 PM   Specimen: BLOOD  Result Value Ref Range Status   Specimen Description BLOOD BLOOD LEFT HAND  Final   Special Requests   Final    BOTTLES DRAWN AEROBIC  AND ANAEROBIC Blood Culture adequate volume   Culture   Final    NO GROWTH 5 DAYS Performed at [facility], [address] [place], [place] [address]    Report Status 09/01/2020 FINAL  Final  Culture, blood (routine x 2)     Status: None   Collection Time: 08/27/20  6:26 PM   Specimen: BLOOD  Result Value Ref Range Status   Specimen Description BLOOD LEFT ANTECUBITAL  Final   Special Requests   Final    BOTTLES DRAWN AEROBIC AND ANAEROBIC Blood Culture adequate volume   Culture   Final    NO GROWTH 5 DAYS Performed at [facility], [address] [place], [place] [address]    Report Status 09/01/2020 FINAL  Final         Radiology Studies: No results found.      Scheduled Meds: . [MAR Hold] atorvastatin  10 mg Oral QHS  . [MAR Hold] insulin aspart  0-15 Units Subcutaneous TID WC  . [MAR Hold] insulin glargine  24 Units Subcutaneous QHS  . [MAR Hold] metoprolol tartrate  25 mg Oral BID  . [MAR Hold] metroNIDAZOLE  500 mg Oral Q8H  . [MAR Hold] nicotine  7 mg Transdermal Daily  . [MAR Hold] ondansetron (ZOFRAN) IV  4 mg Intravenous Once  . [MAR Hold] pantoprazole (PROTONIX) IV  40 mg Intravenous QHS  . [MAR Hold] pregabalin  150 mg Oral QHS  . [MAR Hold] sodium chloride flush  5 mL Intracatheter Q8H  . [MAR Hold] tiZANidine  2 mg Oral QHS   Continuous Infusions: . [MAR Hold] sodium chloride    . [MAR Hold] piperacillin-tazobactam (ZOSYN)  IV 3.375 g (09/01/20 0907)  . [MAR Hold] potassium chloride       LOS: 6 days    Arnetha Courser, MD [facility]  This record has been created using Conservation officer, historic buildings. Errors have been sought and corrected,but may not always be located. Such creation errors do not reflect on the standard of care.  To contact the attending provider between 7A-7P or the covering provider during after hours 7P-7A, please log into the web site [email or web address] and access using universal [place] password for that web site. If you do not have the password, please call the hospital operator.  09/01/2020, 1:23 PM

## 2020-09-01 NOTE — Progress Notes (Signed)
[facility] SURGICAL PROGRESS NOTE  Hospital Day(s): 6.   Interval History:  Patient seen and examined No acute events or new complaints overnight.  Patient reports he continues to have RLQ pain, relatively unchanged He did have nausea and emesis overnight but attributes this to the bowel prep He does have a worsening leukocytosis this morning to 12.4; no fevers He has maintained normal renal function; sCr - 1.05; UO - 400 ccs + unmeasured Mild hypokalemia to 3.4 Percutaneous drain with 15 ccs; thick serous fluid Cx grew out E coli, Strep anginosis --> sensitive to Zosyn  Plan today for laparoscopic appendectomy and possible right hemicolectomy  Vital signs in last 24 hours: [min-max] current  Temp:  [98.3 F (36.8 C)-99.3 F (37.4 C)] 98.3 F (36.8 C) (05/09 0358) Pulse Rate:  [73-88] 88 (05/09 0358) Resp:  [16-20] 20 (05/09 0358) BP: (133-142)/(75-82) 133/75 (05/09 0358) SpO2:  [95 %-100 %] 100 % (05/09 0358)     Height: 5\' 8"  (172.7 cm) Weight: 81.6 kg BMI (Calculated): 27.36   Intake/Output last 2 shifts:  05/08 0701 - 05/09 0700 In: 468.7 [I.V.:7.2; IV Piggyback:451.5] Out: 595 [Urine:400; Emesis/NG output:180; Drains:15]   Physical Exam:  Constitutional: alert, cooperative and no distress; more somnolent than previous days HENT: normocephalic without obvious abnormality  Eyes: PERRL, EOM's grossly intact and symmetric  Respiratory: breathing non-labored at rest  Cardiovascular: regular rate and sinus rhythm  Gastrointestinal:Soft,RLQ tenderness, and non-distended, no rebound/guarding. Drain in the RLQ with thick serosanguinous output Musculoskeletal: no edema or wounds, motor and sensation grossly intact, NT   Labs:  CBC Latest Ref Rng & Units 09/01/2020 08/30/2020 08/29/2020  WBC 4.0 - 10.5 K/uL 12.4(H) 10.7(H) 12.5(H)  Hemoglobin 13.0 - 17.0 g/dL 10/29/2020 12.7(L) 12.9(L)  Hematocrit 39.0 - 52.0 % 45.1 37.2(L) 36.7(L)  Platelets 150 - 400 K/uL 353 224 207    CMP Latest Ref Rng & Units 09/01/2020 08/30/2020 08/29/2020  Glucose 70 - 99 mg/dL 10/29/2020) 82 92  BUN 8 - 23 mg/dL 13 12 13   Creatinine 0.61 - 1.24 mg/dL 354(S 5.68  Sodium 135 - 145 mmol/L 140 138 138  Potassium 3.5 - 5.1 mmol/L 3.4(L) 3.3(L) 3.2(L)  Chloride 98 - 111 mmol/L 101 103 103  CO2 22 - 32 mmol/L 20(L) 25 25  Calcium 8.9 - 10.3 mg/dL 8.9 1.27) 5.17)  Total Protein 6.5 - 8.1 g/dL 7.8 - -  Total Bilirubin 0.3 - 1.2 mg/dL 0.0(F) - -  Alkaline Phos 38 - 126 U/L 84 - -  AST 15 - 41 U/L 43(H) - -  ALT 0 - 44 U/L 38 - -     Imaging studies: No new pertinent imaging studies   Assessment/Plan:  75 y.o. male without any clinical improvement despite conservative measures admittedwith ruptured appendicitis with abscesss/p drain placement on 05/04, complicated by pertinent comorbidities includingHTN, HLD, DM2.   - Plan for laparoscopic appendectomy with possible right hemicolectomy this afternoon with Dr 61  - All risks, benefits, and alternatives to above procedure(s) were discussed with the patient, all of his questions were answered to his expressed satisfaction, patient expresses he wishes to proceed, and informed consent was obtained.    - NPO + IVF Resuscitation  - IV Abx (Zosyn)  - Continue percutaneous drain; monitor and record output; flush daily with 5-10 ccs NS   - Monitor abdominal examination             - Pain control prn; antiemetics prn - Mobilization as tolerated -  Appreciate medicine assistance with comorbid conditions    All of the above findings and recommendations were discussed with the patient, and the medical team, and all of patient's questions were answered to his expressed satisfaction.  -- Lynden Oxford, PA-C [facility] 09/01/2020, 7:17 AM [phone] M-F: 7am - 4pm

## 2020-09-01 NOTE — Anesthesia Postprocedure Evaluation (Signed)
Anesthesia Post Note  Patient: William Parks  Procedure(s) Performed: LAPAROSCOPY DIAGNOSTIC (N/A ) PARTIAL RIGHT COLECTOMY (N/A )  Patient location during evaluation: PACU Anesthesia Type: General Level of consciousness: awake and alert Pain management: pain level controlled Vital Signs Assessment: post-procedure vital signs reviewed and stable Respiratory status: spontaneous breathing and respiratory function stable Cardiovascular status: stable Anesthetic complications: no   No complications documented.   Last Vitals:  Vitals:   09/01/20 2100 09/01/20 2115  BP: 138/71 134/73  Pulse: 75 77  Resp: 11 12  Temp:    SpO2: 93% 93%    Last Pain:  Vitals:   09/01/20 2115  TempSrc:   PainSc: 0-No pain                 Estephan Gallardo K

## 2020-09-01 NOTE — Anesthesia Procedure Notes (Signed)
Procedure Name: Intubation Date/Time: 09/01/2020 3:57 PM Performed by: Jaye Beagle, CRNA Pre-anesthesia Checklist: Patient identified, Emergency Drugs available, Suction available and Patient being monitored Patient Re-evaluated:Patient Re-evaluated prior to induction Oxygen Delivery Method: Circle system utilized Preoxygenation: Pre-oxygenation with 100% oxygen Induction Type: IV induction and Rapid sequence Laryngoscope Size: McGraph and 4 Grade View: Grade I Tube type: Oral Tube size: 7.5 mm Number of attempts: 1 Airway Equipment and Method: Stylet and Oral airway Placement Confirmation: ETT inserted through vocal cords under direct vision,  positive ETCO2 and breath sounds checked- equal and bilateral Secured at: 22 cm Tube secured with: Tape Dental Injury: Teeth and Oropharynx as per pre-operative assessment

## 2020-09-02 ENCOUNTER — Encounter: Payer: Self-pay | Admitting: Surgery

## 2020-09-02 DIAGNOSIS — K3532 Acute appendicitis with perforation and localized peritonitis, without abscess: Secondary | ICD-10-CM | POA: Diagnosis not present

## 2020-09-02 LAB — CBC
HCT: 43.1 % (ref 39.0–52.0)
Hemoglobin: 14 g/dL (ref 13.0–17.0)
MCH: 28.3 pg (ref 26.0–34.0)
MCHC: 32.5 g/dL (ref 30.0–36.0)
MCV: 87.1 fL (ref 80.0–100.0)
Platelets: 395 10*3/uL (ref 150–400)
RBC: 4.95 MIL/uL (ref 4.22–5.81)
RDW: 14.6 % (ref 11.5–15.5)
WBC: 22.7 10*3/uL — ABNORMAL HIGH (ref 4.0–10.5)
nRBC: 0 % (ref 0.0–0.2)

## 2020-09-02 LAB — BASIC METABOLIC PANEL
Anion gap: 15 (ref 5–15)
BUN: 24 mg/dL — ABNORMAL HIGH (ref 8–23)
CO2: 19 mmol/L — ABNORMAL LOW (ref 22–32)
Calcium: 7.9 mg/dL — ABNORMAL LOW (ref 8.9–10.3)
Chloride: 105 mmol/L (ref 98–111)
Creatinine, Ser: 1.18 mg/dL (ref 0.61–1.24)
GFR, Estimated: >60 mL/min (ref >60)
Glucose, Bld: 278 mg/dL — ABNORMAL HIGH (ref 70–99)
Potassium: 4.9 mmol/L (ref 3.5–5.1)
Sodium: 139 mmol/L (ref 135–145)

## 2020-09-02 LAB — GLUCOSE, CAPILLARY
Glucose-Capillary: 275 mg/dL — ABNORMAL HIGH (ref 70–99)
Glucose-Capillary: 252 mg/dL — ABNORMAL HIGH (ref 70–99)
Glucose-Capillary: 219 mg/dL — ABNORMAL HIGH (ref 70–99)
Glucose-Capillary: 241 mg/dL — ABNORMAL HIGH (ref 70–99)
Glucose-Capillary: 268 mg/dL — ABNORMAL HIGH (ref 70–99)

## 2020-09-02 LAB — PHOSPHORUS: Phosphorus: 3.9 mg/dL (ref 2.5–4.6)

## 2020-09-02 LAB — MAGNESIUM: Magnesium: 2 mg/dL (ref 1.7–2.4)

## 2020-09-02 MED ORDER — INSULIN ASPART 100 UNIT/ML IJ SOLN
0.0000 [IU] | INTRAMUSCULAR | Status: DC
  Administered 2020-09-02 (×2): 5 [IU] via SUBCUTANEOUS
  Administered 2020-09-02 – 2020-09-03 (×3): 8 [IU] via SUBCUTANEOUS
  Administered 2020-09-03: 11 [IU] via SUBCUTANEOUS
  Administered 2020-09-03 (×3): 8 [IU] via SUBCUTANEOUS
  Administered 2020-09-04: 5 [IU] via SUBCUTANEOUS
  Administered 2020-09-04 (×2): 2 [IU] via SUBCUTANEOUS
  Administered 2020-09-04: 5 [IU] via SUBCUTANEOUS
  Administered 2020-09-04: 3 [IU] via SUBCUTANEOUS
  Administered 2020-09-04: 2 [IU] via SUBCUTANEOUS
  Administered 2020-09-05: 5 [IU] via SUBCUTANEOUS
  Administered 2020-09-05 (×2): 3 [IU] via SUBCUTANEOUS
  Administered 2020-09-05 (×2): 5 [IU] via SUBCUTANEOUS
  Administered 2020-09-05: 3 [IU] via SUBCUTANEOUS
  Administered 2020-09-06: 8 [IU] via SUBCUTANEOUS
  Administered 2020-09-06: 5 [IU] via SUBCUTANEOUS
  Administered 2020-09-06: 8 [IU] via SUBCUTANEOUS
  Administered 2020-09-06: 5 [IU] via SUBCUTANEOUS
  Administered 2020-09-06: 8 [IU] via SUBCUTANEOUS
  Administered 2020-09-06 – 2020-09-07 (×6): 5 [IU] via SUBCUTANEOUS
  Administered 2020-09-07: 8 [IU] via SUBCUTANEOUS
  Administered 2020-09-08 (×4): 5 [IU] via SUBCUTANEOUS
  Administered 2020-09-08 (×2): 3 [IU] via SUBCUTANEOUS
  Administered 2020-09-08: 5 [IU] via SUBCUTANEOUS
  Administered 2020-09-09 (×2): 3 [IU] via SUBCUTANEOUS
  Administered 2020-09-09 (×2): 5 [IU] via SUBCUTANEOUS
  Administered 2020-09-09 – 2020-09-10 (×4): 3 [IU] via SUBCUTANEOUS
  Administered 2020-09-11: 2 [IU] via SUBCUTANEOUS
  Administered 2020-09-11: 3 [IU] via SUBCUTANEOUS
  Administered 2020-09-11: 2 [IU] via SUBCUTANEOUS
  Filled 2020-09-02 (×50): qty 1

## 2020-09-02 MED ORDER — SODIUM CHLORIDE 0.9% FLUSH
10.0000 mL | Freq: Two times a day (BID) | INTRAVENOUS | Status: DC
  Administered 2020-09-02 – 2020-09-11 (×16): 10 mL

## 2020-09-02 MED ORDER — TRAVASOL 10 % IV SOLN
INTRAVENOUS | Status: AC
  Filled 2020-09-02: qty 396

## 2020-09-02 MED ORDER — CHLORHEXIDINE GLUCONATE CLOTH 2 % EX PADS
6.0000 | MEDICATED_PAD | Freq: Every day | CUTANEOUS | Status: DC
  Administered 2020-09-02 – 2020-09-11 (×9): 6 via TOPICAL

## 2020-09-02 MED ORDER — SODIUM CHLORIDE 0.9% FLUSH: 10.0000 mL | INTRAVENOUS | Status: DC | PRN

## 2020-09-02 NOTE — Progress Notes (Signed)
Consult PROGRESS NOTE    William Parks  GXQ:[id number] DOB: 1946-02-21 DOA: 08/26/2020 PCP: Alba Cory, MD   Chief Complaint  Patient presents with  . Abdominal Pain    Brief Narrative:  75 yo M DM2, HTN, HLD, neuropothy came in with Right Lq pain 5 days no nausea voting or fever, not taking his insulin or BP meds for the past 3 days. He was too weak to even get to get his medicine.  Found to have perforated appendix, admitted under surgery service and we were consulted for diabetes management. Patient s/p percutaneous drain placement with IR.  Patient had poor response to percutaneous drainage, continued to have pain and a concern of new abscess formation. Patient underwent exploratory laparotomy with right hemicolectomy with general surgery on 09/01/2020.  Tolerated the procedure well. Surgery ordered TPN today.  Subjective: Patient was feeling little lethargic but denies any abdominal pain, nausea or vomiting.  He was little annoyed by his NG tube.  No flatus or bowel movement.  Assessment & Plan:   Active Problems:   Hypertension   Dyslipidemia   Ruptured appendicitis   Acute appendicitis with localized peritonitis and abscess   DM2 (diabetes mellitus, type 2) (HCC)   DM (diabetes mellitus), type 2, uncontrolled (HCC)   Perforated appendix with abscess.  S/p right hemicolectomy, after failing percutaneous drainage by IR. CT 5/6 found another developing abscess, appeared difficult to access with IR approach. Patient with NG tube and suctioning today, no passing of flatus or BM, concern of developing ileus. -Keep him n.p.o. -Continue with NG suctioning -PICC line ordered to start TPN by surgery today. -cont IV zosyn -Continue with pain management -PT/OT evaluation  Uncontrolled type 2 diabetes mellitus -Hemoglobin A1c 10.5 (08/27/2020).  CBG little elevated today as nighttime dose of Lantus was held due to surgery. -Continue with Lantus. -SSI TID, no night-time  coverage -Consult with diabetic coordinator for diabetes education.  Diabetic neuropathy -Cont Lyrica  Hypertension.  Blood pressure within goal. -Continue Lopressor. -Keep holding Cozaar-can be restarted if needed.  Hyperlipidemia -Continue statin.  Tobacco abuse -Tobacco cessation stressed to patient.   -cont Nicotine patch   DVT prophylaxis: SCDs Code Status: Full Family Communication: Discussed with daughter on phone. Disposition:   Status is: Inpatient  Dispo: The patient is from: Home              Anticipated d/c is to: per primary team         Consultants:   [facility] hospitalist: Dr. Adela Glimpse 08/26/2020    Procedures:   CT abdomen and pelvis 08/26/2020  Status post CT-guided placement of 8.5 French pigtail drainage catheter into right lower quadrant periappendiceal phlegmon per Dr. Loreta Ave IR 08/27/2020  Exploratory laparotomy with right hemicolectomy  Antimicrobials:   IV Zosyn 08/26/2020>>>>>   Objective: Vitals:   09/02/20 0502 09/02/20 0816 09/02/20 1109 09/02/20 1200  BP: 134/68 134/70 138/71 (!) 143/68  Pulse: 82 82 83 82  Resp: 16 16 18    Temp: 98.4 F (36.9 C) 98.5 F (36.9 C) 98.6 F (37 C)   TempSrc: Oral Oral Oral   SpO2: 96% 95% 96%   Weight:      Height:        Intake/Output Summary (Last 24 hours) at 09/02/2020 1510 Last data filed at 09/02/2020 1000 Gross per 24 hour  Intake 3000 ml  Output 2560 ml  Net 440 ml   Filed Weights   08/26/20 1933 08/26/20 2048  Weight: 86.2 kg 81.6 kg  Examination:  General.  Lethargic appearing gentleman, in no acute distress.  NG tube in place Pulmonary.  Lungs clear bilaterally, normal respiratory effort. CV.  Regular rate and rhythm, no JVD, rub or murmur. Abdomen.  Soft, clean midline honeycomb bandage and a drain in place, hypoactive bowel sounds CNS.  Alert and oriented x3.  No focal neurologic deficit. Extremities.  No edema, no cyanosis, pulses intact and symmetrical. Psychiatry.   Judgment and insight appears normal.  Data Reviewed: I have personally reviewed following labs and imaging studies  CBC: Recent Labs  Lab 08/27/20 0101 08/27/20 1826 08/28/20 0550 08/29/20 0427 08/30/20 0421 09/01/20 0341 09/02/20 0252  WBC 13.2*   < > 10.4 12.5* 10.7* 12.4* 22.7*  NEUTROABS 11.2*  --   --  9.9*  --   --   --   HGB 14.2   < > 12.8* 12.9* 12.7* 14.9 14.0  HCT 40.7   < > 37.7* 36.7* 37.2* 45.1 43.1  MCV 84.4   < > 84.5 84.2 83.4 85.6 87.1  PLT 214   < > 195 207 224 353 395   < > = values in this interval not displayed.    Basic Metabolic Panel: Recent Labs  Lab 08/28/20 0550 08/29/20 0427 08/30/20 0421 08/31/20 0430 09/01/20 0341 09/02/20 0252  NA 136 138 138  --  140 139  K 3.4* 3.2* 3.3*  --  3.4* 4.9  CL 102 103 103  --  101 105  CO2 26 25 25   --  20* 19*  GLUCOSE 226* 92 82  --  130* 278*  BUN 23 13 12   --  13 24*  CREATININE 1.26* 1.08 0.89  --  1.05 1.18  CALCIUM 8.3* 8.0* 8.1*  --  8.9 7.9*  MG  --  1.7 1.8 1.9 2.2 2.0  PHOS  --   --   --   --   --  3.9    GFR: Estimated Creatinine Clearance: 52.3 mL/min (by C-G formula based on SCr of 1.18 mg/dL).  Liver Function Tests: Recent Labs  Lab 08/26/20 1529 08/27/20 0101 09/01/20 0341  AST 21 20 43*  ALT 28 27 38  ALKPHOS 78 76 84  BILITOT 1.2 1.2 1.6*  PROT 8.0 7.6 7.8  ALBUMIN 3.3* 2.9* 2.7*    CBG: Recent Labs  Lab 09/01/20 1221 09/01/20 1326 09/01/20 2045 09/02/20 0736 09/02/20 1146  GLUCAP 174* 182* 196* 275* 252*     Recent Results (from the past 240 hour(s))  Resp Panel by RT-PCR (Flu A&B, Covid) Nasopharyngeal Swab     Status: None   Collection Time: 08/26/20  4:49 PM   Specimen: Nasopharyngeal Swab; Nasopharyngeal(NP) swabs in vial transport medium  Result Value Ref Range Status   SARS Coronavirus 2 by RT PCR NEGATIVE NEGATIVE Final    Comment: (NOTE) SARS-CoV-2 target nucleic acids are NOT DETECTED.  The SARS-CoV-2 RNA is generally detectable in upper  respiratory specimens during the acute phase of infection. The lowest concentration of SARS-CoV-2 viral copies this assay can detect is 138 copies/mL. A negative result does not preclude SARS-Cov-2 infection and should not be used as the sole basis for treatment or other patient management decisions. A negative result may occur with  improper specimen collection/handling, submission of specimen other than nasopharyngeal swab, presence of viral mutation(s) within the areas targeted by this assay, and inadequate number of viral copies(<138 copies/mL). A negative result must be combined with clinical observations, patient history, and epidemiological information. The expected  result is Negative.  Fact Sheet for Patients:  BloggerCourse.com  Fact Sheet for Healthcare Providers:  SeriousBroker.it  This test is no t yet approved or cleared by the [place] FDA and  has been authorized for detection and/or diagnosis of SARS-CoV-2 by FDA under an Emergency Use Authorization (EUA). This EUA will remain  in effect (meaning this test can be used) for the duration of the COVID-19 declaration under Section 564(b)(1) of the Act, 21 U.S.C.section 360bbb-3(b)(1), unless the authorization is terminated  or revoked sooner.       Influenza A by PCR NEGATIVE NEGATIVE Final   Influenza B by PCR NEGATIVE NEGATIVE Final    Comment: (NOTE) The Xpert Xpress SARS-CoV-2/FLU/RSV plus assay is intended as an aid in the diagnosis of influenza from Nasopharyngeal swab specimens and should not be used as a sole basis for treatment. Nasal washings and aspirates are unacceptable for Xpert Xpress SARS-CoV-2/FLU/RSV testing.  Fact Sheet for Patients: BloggerCourse.com  Fact Sheet for Healthcare Providers: SeriousBroker.it  This test is not yet approved or cleared by the [place] FDA and has been  authorized for detection and/or diagnosis of SARS-CoV-2 by FDA under an Emergency Use Authorization (EUA). This EUA will remain in effect (meaning this test can be used) for the duration of the COVID-19 declaration under Section 564(b)(1) of the Act, 21 U.S.C. section 360bbb-3(b)(1), unless the authorization is terminated or revoked.  Performed at [facility], [address] [place], [place] [address]   Aerobic/Anaerobic Culture (surgical/deep wound)     Status: None   Collection Time: 08/27/20  3:54 PM   Specimen: Abdomen; Abscess  Result Value Ref Range Status   Specimen Description   Final    ABDOMEN Performed at [facility], [address] [place], [place] [address]    Special Requests   Final    NONE Performed at [facility], [address] [place], [place] [address]    Gram Stain   Final    MODERATE WBC PRESENT, PREDOMINANTLY PMN ABUNDANT GRAM POSITIVE COCCI ABUNDANT GRAM POSITIVE RODS    Culture   Final    RARE ESCHERICHIA COLI MODERATE STREPTOCOCCUS ANGINOSIS NO ANAEROBES ISOLATED Performed at [facility], [address], [place], [place] [address]    Report Status 09/01/2020 FINAL  Final   Organism ID, Bacteria ESCHERICHIA COLI  Final   Organism ID, Bacteria STREPTOCOCCUS ANGINOSIS  Final      Susceptibility   Escherichia coli - MIC*    AMPICILLIN >=32 RESISTANT Resistant     CEFAZOLIN <=4 SENSITIVE Sensitive     CEFEPIME <=0.12 SENSITIVE Sensitive     CEFTAZIDIME <=1 SENSITIVE Sensitive     CEFTRIAXONE <=0.25 SENSITIVE Sensitive     CIPROFLOXACIN <=0.25 SENSITIVE Sensitive     GENTAMICIN <=1 SENSITIVE Sensitive     IMIPENEM <=0.25 SENSITIVE Sensitive     TRIMETH/SULFA <=20 SENSITIVE Sensitive     AMPICILLIN/SULBACTAM 16 INTERMEDIATE Intermediate     PIP/TAZO <=4 SENSITIVE Sensitive     * RARE ESCHERICHIA COLI   Streptococcus anginosis - MIC*    PENICILLIN 0.12 SENSITIVE Sensitive     CEFTRIAXONE 0.5 SENSITIVE  Sensitive     ERYTHROMYCIN 2 RESISTANT Resistant     LEVOFLOXACIN 0.5 SENSITIVE Sensitive     VANCOMYCIN 0.5 SENSITIVE Sensitive     * MODERATE STREPTOCOCCUS ANGINOSIS  Culture, blood (routine x 2)     Status: None   Collection Time: 08/27/20  6:10 PM   Specimen: BLOOD  Result Value Ref Range Status   Specimen Description BLOOD BLOOD LEFT HAND  Final   Special Requests   Final    BOTTLES DRAWN AEROBIC AND ANAEROBIC Blood Culture adequate volume   Culture   Final    NO GROWTH 5 DAYS Performed at [facility], [address] [place], [place] [address]    Report Status 09/01/2020 FINAL  Final  Culture, blood (routine x 2)     Status: None   Collection Time: 08/27/20  6:26 PM   Specimen: BLOOD  Result Value Ref Range Status   Specimen Description BLOOD LEFT ANTECUBITAL  Final   Special Requests   Final    BOTTLES DRAWN AEROBIC AND ANAEROBIC Blood Culture adequate volume   Culture   Final    NO GROWTH 5 DAYS Performed at [facility], [address] [place], [place] [address]    Report Status 09/01/2020 FINAL  Final     Radiology Studies: US EKG SITE RITE  Result Date: 09/01/2020 If Site Rite image not attached, placement could not be confirmed due to current cardiac rhythm.   Scheduled Meds: . Chlorhexidine Gluconate Cloth  6 each Topical Q0600  . insulin aspart  0-15 Units Subcutaneous Q4H  . insulin glargine  24 Units Subcutaneous QHS  . metoprolol tartrate  5 mg Intravenous Q6H  . nicotine  7 mg Transdermal Daily  . pantoprazole (PROTONIX) IV  40 mg Intravenous QHS  . pregabalin  150 mg Oral QHS  . sodium chloride flush  10-40 mL Intracatheter Q12H  . tiZANidine  2 mg Oral QHS   Continuous Infusions: . sodium chloride Stopped (09/02/20 0726)  . piperacillin-tazobactam (ZOSYN)  IV 3.375 g (09/02/20 0819)  . TPN ADULT (ION)       LOS: 7 days    Arnetha CourserSumayya Hebert Dooling, MD [facility]  This record has been created using Manufacturing engineerDragon voice recognition  software. Errors have been sought and corrected,but may not always be located. Such creation errors do not reflect on the standard of care.  To contact the attending provider between 7A-7P or the covering provider during after hours 7P-7A, please log into the web site [email or web address] and access using universal [place] password for that web site. If you do not have the password, please call the hospital operator.  09/02/2020, 3:10 PM

## 2020-09-02 NOTE — Progress Notes (Signed)
[facility] SURGICAL PROGRESS NOTE  Hospital Day(s): 7.   Post op day(s): 1 Day Post-Op.   Interval History:  Patient seen and examined No acute events or new complaints overnight.  Patient reports he is doing okay, still with incisional soreness No nausea, emesis He did have a marked jump in his leukocytosis this morning to 22.7K; no fevers, likely reactive He is maintaining his renal function; sCr - 1.18; UO - 1L No electrolyte derangements NGT with 100 ccs out Surgical drain with 85 ccs out; serosanguinous He has remained NPO  Vital signs in last 24 hours: [min-max] current  Temp:  [97.4 F (36.3 C)-98.6 F (37 C)] 98.4 F (36.9 C) (05/10 0502) Pulse Rate:  [73-85] 82 (05/10 0502) Resp:  [10-18] 16 (05/10 0502) BP: (129-141)/(65-78) 134/68 (05/10 0502) SpO2:  [93 %-97 %] 96 % (05/10 0502)     Height: 5\' 8"  (172.7 cm) Weight: 81.6 kg BMI (Calculated): 27.36   Intake/Output last 2 shifts:  05/09 0701 - 05/10 0700 In: 3010 [I.V.:3000] Out: 2235 [Urine:1050; Emesis/NG output:100; Drains:85; Blood:200]   Physical Exam:  Constitutional: alert, cooperative and no distress  HEENT: NGT in palce Respiratory: breathing non-labored at rest  Cardiovascular: regular rate and sinus rhythm  Gastrointestinal: Soft, incisional soreness expectedly, non-distended, no rebound/guarding Genitourinary: Foley in place Integumentary: Laparotomy incision is CDI with staples and honeycomb, no erythema  Labs:  CBC Latest Ref Rng & Units 09/02/2020 09/01/2020 08/30/2020  WBC 4.0 - 10.5 K/uL 22.7(H) 12.4(H) 10.7(H)  Hemoglobin 13.0 - 17.0 g/dL 10/30/2020 19.1 12.7(L)  Hematocrit 39.0 - 52.0 % 43.1 45.1 37.2(L)  Platelets 150 - 400 K/uL 395 353 224   CMP Latest Ref Rng & Units 09/02/2020 09/01/2020 08/30/2020  Glucose 70 - 99 mg/dL 10/30/2020) 295(A) 82  BUN 8 - 23 mg/dL 213(Y) 13 12  Creatinine 0.William Parks - 1.24 mg/dL 86(V 7.84 6.96  Sodium 135 - 145 mmol/L 139 140 138  Potassium 3.5 - 5.1 mmol/L 4.9  3.4(L) 3.3(L)  Chloride 98 - 111 mmol/L 105 101 103  CO2 22 - 32 mmol/L 19(L) 20(L) 25  Calcium 8.9 - 10.3 mg/dL 7.9(L) 8.9 8.1(L)  Total Protein 6.5 - 8.1 g/dL - 7.8 -  Total Bilirubin 0.3 - 1.2 mg/dL - 1.6(H) -  Alkaline Phos 38 - 126 U/L - 84 -  AST 15 - 41 U/L - 43(H) -  ALT 0 - 44 U/L - 38 -     Imaging studies: No new pertinent imaging studies   Assessment/Plan:  75 y.o. male 1 Day Post-Op s/p exploratory laparotomy and right hemicolectomy for perforated appendicitis.   - Given lack of PO intake and anticipated ileus, we will proceed with PICC and TPN today   - Continue NGT decompression to LIS; monitor and record output  - Discontinue foley catheter   - NPO + IVF Resuscitation   - IV Abx (Zosyn)   - Monitor abdominal examination - Pain control prn; antiemetics prn - Mobilization as tolerated; low threshold to engage PT  - Appreciate medicine assistance with comorbid conditions   All of the above findings and recommendations were discussed with the patient, and the medical team, and all of patient's questions were answered to his expressed satisfaction.  -- 60, PA-C [facility] 09/02/2020, 7:17 AM [phone] M-F: 7am - 4pm

## 2020-09-02 NOTE — Progress Notes (Signed)
Inpatient Diabetes Program Recommendations  AACE/ADA: New Consensus Statement on Inpatient Glycemic Control   Target Ranges:  Prepandial:   less than 140 mg/dL      Peak postprandial:   less than 180 mg/dL (1-2 hours)      Critically ill patients:  140 - 180 mg/dL   Results for [name], [name] (MRN [id number]) as of 09/02/2020 10:17  Ref. Range 09/01/2020 07:40 09/01/2020 12:21 09/01/2020 13:26 09/01/2020 20:45 09/02/2020 07:36  Glucose-Capillary Latest Ref Range: 70 - 99 mg/dL 226 (H) 333 (H) 545 (H) 196 (H) 275 (H)   Review of Glycemic Control  Diabetes history: DM2 Outpatient Diabetes medications: 75/25 30 units QAM, 75/25 25 units QPM Current orders for Inpatient glycemic control: Lantus 24 units QHS, Novolog 0-15 units TID with meals; NPO  Inpatient Diabetes Program Recommendations:    Insulin: Per MAR Lantus was NOT GIVEN on 09/01/20 which is likely why fasting glucose 275 mg/dl this morning.   Thanks, Orlando Penner, RN, MSN, CDE Diabetes Coordinator Inpatient Diabetes Program [phone] (Team Pager from 8am to 5pm)

## 2020-09-02 NOTE — Care Management Important Message (Signed)
Important Message  Patient Details  Name: William Parks MRN: [id number] Date of Birth: [birth year]   Medicare Important Message Given:  Yes     [name] 09/02/2020, 10:53 AM

## 2020-09-02 NOTE — Progress Notes (Signed)
Nutrition Follow-up  DOCUMENTATION CODES:  Not applicable  INTERVENTION:   Advance diet as able per surgery  Initiate TPN via PICC line to meet pt's nutrition needs until PO intake is adequate. See below for estimated needs.  Pt is at high refeeding risk. Recommend adding 100mg  thiamine and 1 mg folic acid x 3 days to TPN bag  Monitor refeeding labs and replace as needed: Phosphorus, Mg, K, glucose  Adjust IVF as needed to meet fluid needs and avoid over hydration  NUTRITION DIAGNOSIS:  Inadequate oral intake related to nausea,inability to eat as evidenced by NPO status,per patient/family report.  GOAL:  Patient will meet greater than or equal to 90% of their needs  MONITOR:  PO intake,Diet advancement,Supplement acceptance  REASON FOR ASSESSMENT:  Consult New TPN/TNA  ASSESSMENT:  Pt admitted with 5 days of worsening abdominal pain. Reports poor appetite at home. Imaging in ED showed a perforated appendicitis with a contained abscess. PMH relevant for DM type 2, HTN, HLD  5/3 - CT Abdomen/pelvis, impression: Acute appendicitis, with 5.0 x 5.2 x 5.0 cm periappendiceal abscess in the right lower quadrant 5/4 - Image guided drain placement 5/6 - CT Abdomen/pelvis, impression: increased inflammation in the right lower quadrant associated with perforated appendicitis and periappendiceal abscesses. There continues to be complex abscess collections in the right lower quadrant despite placement of a percutaneous drain in this area. Increased inflammatory changes involving the distal ileum and terminal ileum. 5/9 - Op, exploratory laparotomy and right hemicolectomy for perforated appendicitis  Since last assessment, pt had poor response to drain placement and new abscess formations. Surgery ultimately took pt to OR 5/9 for exploratory lap with right partial colectomy. Pt has had poor intake this admission and prior to admission. Plan is for PICC and initiation of TPN today. NGT in place  for suction  Pt having PICC placed at the time of assessment. Unable to perform interview at this time. Pt has had no recorded intake this admission and notes that appetite was poor prior to hospitalization. Pt is at high risk for refeeding due to prolonged period without nutrition. Monitor refeeding labs x 3 days. Noted, if accurate 5.6% weight loss x 1 month.  Relevant Scheduled Meds: . insulin aspart  0-15 Units Subcutaneous TID WC  . insulin glargine  24 Units Subcutaneous QHS  . pantoprazole  40 mg Intravenous QHS   Relevant Continuous Infusions: . piperacillin-tazobactam (ZOSYN)  IV 3.375 g (09/02/20 0819)   Relevant PRN Meds: ondansetron  Labs reviewed:   SBG ranges from 130-278 mg/dL over the last 24 hours  HgbA1c 10.5 (5/4)  NUTRITION - FOCUSED PHYSICAL EXAM: Defer to follow-up assessment  Diet Order:   Diet Order            Diet NPO time specified Except for: Sips with Meds  Diet effective midnight                Diet Order Hx: No recorded meal intake this admission 5/3-5/4: NPO 5/5: Clear Liquids 5/6-5/7: Full Liquids 5/8: Clear Liquids 5/6-present: NPO  EDUCATION NEEDS:  No education needs have been identified at this time  Skin:  Skin Assessment: Skin Integrity Issues: Skin Integrity Issues:: Incisions Incisions: midline  Last BM:  5/8 per RN documentation  Height:  Ht Readings from Last 1 Encounters:  08/26/20 5\' 8"  (1.727 m)    Weight:  Wt Readings from Last 1 Encounters:  08/26/20 81.6 kg    Ideal Body Weight:  70 kg  BMI:  Body mass index is 27.35 kg/m.  Estimated Nutritional Needs:   Kcal:  2000-2200 kcal/d  Protein:  110-120 g/day  Fluid:  2059mL-2200mL/d   Greig Castilla, RD, LDN Clinical Dietitian Pager on Amion

## 2020-09-02 NOTE — Progress Notes (Signed)
Peripherally Inserted Central Catheter Placement  The IV Nurse has discussed with the patient and/or persons authorized to consent for the patient, the purpose of this procedure and the potential benefits and risks involved with this procedure.  The benefits include less needle sticks, lab draws from the catheter, and the patient may be discharged home with the catheter. Risks include, but not limited to, infection, bleeding, blood clot (thrombus formation), and puncture of an artery; nerve damage and irregular heartbeat and possibility to perform a PICC exchange if needed/ordered by physician.  Alternatives to this procedure were also discussed.  Bard Power PICC patient education guide, fact sheet on infection prevention and patient information card has been provided to patient /or left at bedside.    PICC Placement Documentation  PICC Double Lumen 09/02/20 PICC Right Basilic 41 cm 0 cm (Active)  Indication for Insertion or Continuance of Line Administration of hyperosmolar/irritating solutions (i.e. TPN, Vancomycin, etc.) 09/01/20 2141  Exposed Catheter (cm) 0 cm 09/01/20 2141  Site Assessment Clean;Dry;Intact 09/01/20 2141  Lumen #1 Status Flushed;Saline locked;Blood return noted 09/01/20 2141  Lumen #2 Status Flushed;Saline locked;Blood return noted 09/01/20 2141  Dressing Type Transparent;Securing device 09/01/20 2141  Dressing Status Clean;Dry;Intact 09/01/20 2141  Antimicrobial disc in place? Yes 09/01/20 2141  Safety Lock Not Applicable 09/01/20 2141  Line Care Connections checked and tightened 09/01/20 2141  Dressing Intervention New dressing 09/01/20 2141  Dressing Change Due 09/09/20 09/01/20 2141       Timmothy Sours 09/02/2020, 9:50 AM

## 2020-09-02 NOTE — Consult Note (Signed)
PHARMACY - TOTAL PARENTERAL NUTRITION CONSULT NOTE   Indication: Prolonged ileus  Patient Measurements: Height: 5\' 8"  (172.7 cm) Weight: 81.6 kg (179 lb 14.3 oz) IBW/kg (Calculated) : 68.4 TPN AdjBW (KG): 72.8 Body mass index is 27.35 kg/m.  Assessment:  Patient is a 75 y/o M with medical history including T2DM c/b neuropathy, HTN, HLD who presented to the ED 5/3 with RLQ pain and poor oral intake x 4 days. Patient subsequently found to have perforated appendicitis with abscess. IR placed drain into peri-appendiceal phlegmon on 5/4. Despite conservative measures, patient without clinical improvement and underwent laparoscopic appendectomy with partial right colectomy on 5/9. Pharmacy has been consulted to initiate TPN for prolonged ileus.   Glucose / Insulin: Hgb A1c 10.5%. Patient currently ordered moderate SSI + Lantus 24u HS. Glucose 182 - 278 last 24h. Patient received Decadron 4 mg x 1 yesterday during procedure.  Electrolytes: Potassium at ULN. Mild metabolic acidosis.  Renal: Scr trending up to 1.18 today but overall stable Hepatic: Transaminases slightly increased from admission. Tbili 1.6 yesterday Intake / Output; MIVF: NGT in place. Surgical drain in place. Net negative 1.3L for the admission. No MIVF GI Imaging: 5/3 CT abdomen / pelvis: Acute appendicitis, with periappendiceal abscess in the RLQ 5/6 CT abdomen / pelvis: Increased inflammation in the RLQ associated with the perforated appendicitis and periappendiceal abscesses. Persistent complex abscess collections in the right lower quadrant despite percutaneous drain. Increased inflammatory changes involving the distal ileum and terminal ileum  GI Surgeries / Procedures:  5/4: CT guided peri-appendiceal drain placement 5/10: Laparoscopic appendectomy, partial right colectomy  Central access: Pending PICC placement 5/10 TPN start date: 5/10 upon PICC placement  Nutritional Goals (per RD recommendation on 5/10): kCal: 2000  - 2200 / day, Protein: 110 - 120 g/day, Fluid: 2000 mL - 2200 mL/day Goal TPN rate is 90 mL/hr (provides 118.8 g of protein and 2151 kcals per day)  Current Nutrition:  NPO  Plan:  --Start TPN at 30 mL/hr (1/3 goal rate) at 1800  Amino acid: 39.6 g  Dextrose: 100.8 g  Lipids: 21.6 g  Fluid: 720 mL --Electrolytes in TPN: Na 7mEq/L, K 39mEq/L, Ca 25mEq/L, Mg 63mEq/L, and Phos 56mmol/L. Cl:Ac 1:1 --Add standard MVI and trace elements to TPN; add thiamine 100 mg and folic acid 1 mg x 3 days given re-feed risk --Continue Moderate q4h SSI and adjust as needed  --Monitor TPN labs on Mon/Thurs, daily until stable  12m 09/02/2020,7:11 AM

## 2020-09-03 DIAGNOSIS — K3532 Acute appendicitis with perforation and localized peritonitis, without abscess: Secondary | ICD-10-CM | POA: Diagnosis not present

## 2020-09-03 LAB — URINALYSIS, ROUTINE W REFLEX MICROSCOPIC
Bacteria, UA: NONE SEEN
Bilirubin Urine: NEGATIVE
Glucose, UA: >=500 mg/dL — AB
Ketones, ur: NEGATIVE mg/dL
Leukocytes,Ua: NEGATIVE
Nitrite: NEGATIVE
Protein, ur: 30 mg/dL — AB
Specific Gravity, Urine: 1.025 (ref 1.005–1.030)
Squamous Epithelial / HPF: NONE SEEN (ref 0–5)
pH: 5 (ref 5.0–8.0)

## 2020-09-03 LAB — COMPREHENSIVE METABOLIC PANEL
ALT: 25 U/L (ref 0–44)
AST: 22 U/L (ref 15–41)
Albumin: 2.1 g/dL — ABNORMAL LOW (ref 3.5–5.0)
Alkaline Phosphatase: 56 U/L (ref 38–126)
Anion gap: 8 (ref 5–15)
BUN: 24 mg/dL — ABNORMAL HIGH (ref 8–23)
CO2: 25 mmol/L (ref 22–32)
Calcium: 7.9 mg/dL — ABNORMAL LOW (ref 8.9–10.3)
Chloride: 107 mmol/L (ref 98–111)
Creatinine, Ser: 0.84 mg/dL (ref 0.61–1.24)
GFR, Estimated: >60 mL/min (ref >60)
Glucose, Bld: 336 mg/dL — ABNORMAL HIGH (ref 70–99)
Potassium: 4.1 mmol/L (ref 3.5–5.1)
Sodium: 140 mmol/L (ref 135–145)
Total Bilirubin: 0.6 mg/dL (ref 0.3–1.2)
Total Protein: 6.1 g/dL — ABNORMAL LOW (ref 6.5–8.1)

## 2020-09-03 LAB — PREALBUMIN: Prealbumin: 7.5 mg/dL — ABNORMAL LOW (ref 18–38)

## 2020-09-03 LAB — DIFFERENTIAL
Abs Immature Granulocytes: 0.14 10*3/uL — ABNORMAL HIGH (ref 0.00–0.07)
Basophils Absolute: 0 10*3/uL (ref 0.0–0.1)
Basophils Relative: 0 %
Eosinophils Absolute: 0.1 10*3/uL (ref 0.0–0.5)
Eosinophils Relative: 1 %
Immature Granulocytes: 1 %
Lymphocytes Relative: 7 %
Lymphs Abs: 1.1 10*3/uL (ref 0.7–4.0)
Monocytes Absolute: 1.2 10*3/uL — ABNORMAL HIGH (ref 0.1–1.0)
Monocytes Relative: 7 %
Neutro Abs: 13.6 10*3/uL — ABNORMAL HIGH (ref 1.7–7.7)
Neutrophils Relative %: 84 %

## 2020-09-03 LAB — CBC
HCT: 34.2 % — ABNORMAL LOW (ref 39.0–52.0)
Hemoglobin: 11.4 g/dL — ABNORMAL LOW (ref 13.0–17.0)
MCH: 28.6 pg (ref 26.0–34.0)
MCHC: 33.3 g/dL (ref 30.0–36.0)
MCV: 85.7 fL (ref 80.0–100.0)
Platelets: 352 10*3/uL (ref 150–400)
RBC: 3.99 MIL/uL — ABNORMAL LOW (ref 4.22–5.81)
RDW: 14.6 % (ref 11.5–15.5)
WBC: 16.1 10*3/uL — ABNORMAL HIGH (ref 4.0–10.5)
nRBC: 0 % (ref 0.0–0.2)

## 2020-09-03 LAB — GLUCOSE, CAPILLARY
Glucose-Capillary: 138 mg/dL — ABNORMAL HIGH (ref 70–99)
Glucose-Capillary: 258 mg/dL — ABNORMAL HIGH (ref 70–99)
Glucose-Capillary: 260 mg/dL — ABNORMAL HIGH (ref 70–99)
Glucose-Capillary: 284 mg/dL — ABNORMAL HIGH (ref 70–99)
Glucose-Capillary: 287 mg/dL — ABNORMAL HIGH (ref 70–99)
Glucose-Capillary: 314 mg/dL — ABNORMAL HIGH (ref 70–99)

## 2020-09-03 LAB — MAGNESIUM: Magnesium: 2.3 mg/dL (ref 1.7–2.4)

## 2020-09-03 LAB — TRIGLYCERIDES: Triglycerides: 116 mg/dL (ref <150)

## 2020-09-03 LAB — PHOSPHORUS: Phosphorus: 1.3 mg/dL — ABNORMAL LOW (ref 2.5–4.6)

## 2020-09-03 MED ORDER — METRONIDAZOLE 500 MG/100ML IV SOLN
500.0000 mg | Freq: Three times a day (TID) | INTRAVENOUS | Status: DC
  Administered 2020-09-03 – 2020-09-08 (×15): 500 mg via INTRAVENOUS
  Filled 2020-09-03 (×17): qty 100

## 2020-09-03 MED ORDER — INSULIN GLARGINE 100 UNIT/ML ~~LOC~~ SOLN
30.0000 [IU] | Freq: Every day | SUBCUTANEOUS | Status: DC
  Administered 2020-09-03 – 2020-09-07 (×5): 30 [IU] via SUBCUTANEOUS
  Filled 2020-09-03 (×6): qty 0.3

## 2020-09-03 MED ORDER — SODIUM PHOSPHATES 45 MMOLE/15ML IV SOLN
30.0000 mmol | Freq: Once | INTRAVENOUS | Status: AC
  Administered 2020-09-03: 30 mmol via INTRAVENOUS
  Filled 2020-09-03: qty 10

## 2020-09-03 MED ORDER — CEFAZOLIN SODIUM-DEXTROSE 2-4 GM/100ML-% IV SOLN
2.0000 g | Freq: Three times a day (TID) | INTRAVENOUS | Status: DC
  Administered 2020-09-03 – 2020-09-08 (×15): 2 g via INTRAVENOUS
  Filled 2020-09-03 (×18): qty 100

## 2020-09-03 MED ORDER — TRAVASOL 10 % IV SOLN
INTRAVENOUS | Status: AC
  Filled 2020-09-03: qty 396

## 2020-09-03 MED ORDER — ENOXAPARIN SODIUM 40 MG/0.4ML IJ SOSY
40.0000 mg | PREFILLED_SYRINGE | INTRAMUSCULAR | Status: DC
  Administered 2020-09-03 – 2020-09-10 (×8): 40 mg via SUBCUTANEOUS
  Filled 2020-09-03 (×10): qty 0.4

## 2020-09-03 NOTE — Progress Notes (Signed)
Consult PROGRESS NOTE    William Parks  TIR:[id number] DOB: [birth year] DOA: 08/26/2020 PCP: Alba Cory, MD   Chief Complaint  Patient presents with  . Abdominal Pain    Brief Narrative:  76 yo M DM2, HTN, HLD, neuropothy came in with Right Lq pain 5 days no nausea voting or fever, not taking his insulin or BP meds for the past 3 days. He was too weak to even get to get his medicine.  Found to have perforated appendix, admitted under surgery service and we were consulted for diabetes management. Patient s/p percutaneous drain placement with IR.  Patient had poor response to percutaneous drainage, continued to have pain and a concern of new abscess formation. Patient underwent exploratory laparotomy with right hemicolectomy with general surgery on 09/01/2020.  Tolerated the procedure well. Started on TPN yesterday.  Subjective: Patient continues to feel weak.  No other complaints.  Not passing any flatus, no bowel movement yet.  Assessment & Plan:   Active Problems:   Hypertension   Dyslipidemia   Ruptured appendicitis   Acute appendicitis with localized peritonitis and abscess   DM2 (diabetes mellitus, type 2) (HCC)   DM (diabetes mellitus), type 2, uncontrolled (HCC)   Perforated appendix with abscess.  S/p right hemicolectomy, after failing percutaneous drainage by IR. CT 5/6 found another developing abscess, appeared difficult to access with IR approach. Patient with NG tube and suctioning today, no passing of flatus or BM, concern of developing ileus. -Keep him n.p.o. -Continue with NG suctioning -PICC line ordered to start TPN by surgery today. -cont IV zosyn -Continue with pain management -PT/OT evaluation  Uncontrolled type 2 diabetes mellitus -Hemoglobin A1c 10.5 (08/27/2020).  CBG remained elevated despite getting Lantus last night. -Increase the dose of Lantus to 30 units. -SSI  -Consult with diabetic coordinator -recommending adding 30 units of insulin with  TPN. Diabetic neuropathy -Cont Lyrica  Hypertension.  Blood pressure mildly elevated. -Continue Lopressor. -Keep holding Cozaar-can be restarted if needed.  Hyperlipidemia -Continue statin.  Tobacco abuse -Tobacco cessation stressed to patient.   -cont Nicotine patch   DVT prophylaxis: SCDs Code Status: Full Family Communication: Discussed with daughter on phone. Disposition:   Status is: Inpatient  Dispo: The patient is from: Home              Anticipated d/c is to: per primary team         Consultants:   [facility] hospitalist: Dr. Adela Glimpse 08/26/2020    Procedures:   CT abdomen and pelvis 08/26/2020  Status post CT-guided placement of 8.5 French pigtail drainage catheter into right lower quadrant periappendiceal phlegmon per Dr. Loreta Ave IR 08/27/2020  Exploratory laparotomy with right hemicolectomy  Antimicrobials:   IV Zosyn 08/26/2020>>>>>   Objective: Vitals:   09/03/20 0100 09/03/20 0417 09/03/20 0813 09/03/20 1151  BP: (!) 144/69 129/66 (!) 147/74 (!) 141/71  Pulse: 82 75 73 75  Resp: 16 16 16 16   Temp: 98.8 F (37.1 C) 98.7 F (37.1 C) 98.3 F (36.8 C) 98.4 F (36.9 C)  TempSrc: Oral Oral Oral Oral  SpO2: 97% 100% 97% 97%  Weight:      Height:        Intake/Output Summary (Last 24 hours) at 09/03/2020 1506 Last data filed at 09/03/2020 0107 Gross per 24 hour  Intake 733.01 ml  Output 480 ml  Net 253.01 ml   Filed Weights   08/26/20 1933 08/26/20 2048  Weight: 86.2 kg 81.6 kg    Examination:  General.  Chronically ill-appearing gentleman, in no acute distress. Pulmonary.  Lungs clear bilaterally, normal respiratory effort. CV.  Regular rate and rhythm, no JVD, rub or murmur. Abdomen.  Soft, mild diffuse tenderness, nondistended, BS hypoactive. CNS.  Alert and oriented x3.  No focal neurologic deficit. Extremities.  No edema, no cyanosis, pulses intact and symmetrical. Psychiatry.  Judgment and insight appears normal.  Data Reviewed: I have  personally reviewed following labs and imaging studies  CBC: Recent Labs  Lab 08/29/20 0427 08/30/20 0421 09/01/20 0341 09/02/20 0252 09/03/20 0513  WBC 12.5* 10.7* 12.4* 22.7* 16.1*  NEUTROABS 9.9*  --   --   --  13.6*  HGB 12.9* 12.7* 14.9 14.0 11.4*  HCT 36.7* 37.2* 45.1 43.1 34.2*  MCV 84.2 83.4 85.6 87.1 85.7  PLT 207 224 353 395 352    Basic Metabolic Panel: Recent Labs  Lab 08/29/20 0427 08/30/20 0421 08/31/20 0430 09/01/20 0341 09/02/20 0252 09/03/20 0513  NA 138 138  --  140 139 140  K 3.2* 3.3*  --  3.4* 4.9 4.1  CL 103 103  --  101 105 107  CO2 25 25  --  20* 19* 25  GLUCOSE 92 82  --  130* 278* 336*  BUN 13 12  --  13 24* 24*  CREATININE 1.08 0.89  --  1.05 1.18 0.84  CALCIUM 8.0* 8.1*  --  8.9 7.9* 7.9*  MG 1.7 1.8 1.9 2.2 2.0 2.3  PHOS  --   --   --   --  3.9 1.3*    GFR: Estimated Creatinine Clearance: 73.5 mL/min (by C-G formula based on SCr of 0.84 mg/dL).  Liver Function Tests: Recent Labs  Lab 09/01/20 0341 09/03/20 0513  AST 43* 22  ALT 38 25  ALKPHOS 84 56  BILITOT 1.6* 0.6  PROT 7.8 6.1*  ALBUMIN 2.7* 2.1*    CBG: Recent Labs  Lab 09/02/20 2035 09/02/20 2322 09/03/20 0419 09/03/20 0742 09/03/20 1152  GLUCAP 241* 268* 284* 314* 260*     Recent Results (from the past 240 hour(s))  Resp Panel by RT-PCR (Flu A&B, Covid) Nasopharyngeal Swab     Status: None   Collection Time: 08/26/20  4:49 PM   Specimen: Nasopharyngeal Swab; Nasopharyngeal(NP) swabs in vial transport medium  Result Value Ref Range Status   SARS Coronavirus 2 by RT PCR NEGATIVE NEGATIVE Final    Comment: (NOTE) SARS-CoV-2 target nucleic acids are NOT DETECTED.  The SARS-CoV-2 RNA is generally detectable in upper respiratory specimens during the acute phase of infection. The lowest concentration of SARS-CoV-2 viral copies this assay can detect is 138 copies/mL. A negative result does not preclude SARS-Cov-2 infection and should not be used as the sole  basis for treatment or other patient management decisions. A negative result may occur with  improper specimen collection/handling, submission of specimen other than nasopharyngeal swab, presence of viral mutation(s) within the areas targeted by this assay, and inadequate number of viral copies(<138 copies/mL). A negative result must be combined with clinical observations, patient history, and epidemiological information. The expected result is Negative.  Fact Sheet for Patients:  BloggerCourse.com  Fact Sheet for Healthcare Providers:  SeriousBroker.it  This test is no t yet approved or cleared by the [place] FDA and  has been authorized for detection and/or diagnosis of SARS-CoV-2 by FDA under an Emergency Use Authorization (EUA). This EUA will remain  in effect (meaning this test can be used) for the duration of the COVID-19 declaration under  Section 564(b)(1) of the Act, 21 U.S.C.section 360bbb-3(b)(1), unless the authorization is terminated  or revoked sooner.       Influenza A by PCR NEGATIVE NEGATIVE Final   Influenza B by PCR NEGATIVE NEGATIVE Final    Comment: (NOTE) The Xpert Xpress SARS-CoV-2/FLU/RSV plus assay is intended as an aid in the diagnosis of influenza from Nasopharyngeal swab specimens and should not be used as a sole basis for treatment. Nasal washings and aspirates are unacceptable for Xpert Xpress SARS-CoV-2/FLU/RSV testing.  Fact Sheet for Patients: BloggerCourse.com  Fact Sheet for Healthcare Providers: SeriousBroker.it  This test is not yet approved or cleared by the [place] FDA and has been authorized for detection and/or diagnosis of SARS-CoV-2 by FDA under an Emergency Use Authorization (EUA). This EUA will remain in effect (meaning this test can be used) for the duration of the COVID-19 declaration under Section 564(b)(1) of the Act,  21 U.S.C. section 360bbb-3(b)(1), unless the authorization is terminated or revoked.  Performed at [facility], [address], [place], [place] [address]   Aerobic/Anaerobic Culture (surgical/deep wound)     Status: None   Collection Time: 08/27/20  3:54 PM   Specimen: Abdomen; Abscess  Result Value Ref Range Status   Specimen Description   Final    ABDOMEN Performed at [facility], [address] [place], [place] [address]    Special Requests   Final    NONE Performed at [facility], [address] [place], [place] [address]    Gram Stain   Final    MODERATE WBC PRESENT, PREDOMINANTLY PMN ABUNDANT GRAM POSITIVE COCCI ABUNDANT GRAM POSITIVE RODS    Culture   Final    RARE ESCHERICHIA COLI MODERATE STREPTOCOCCUS ANGINOSIS NO ANAEROBES ISOLATED Performed at [facility], [address], [place], [place] [address]    Report Status 09/01/2020 FINAL  Final   Organism ID, Bacteria ESCHERICHIA COLI  Final   Organism ID, Bacteria STREPTOCOCCUS ANGINOSIS  Final      Susceptibility   Escherichia coli - MIC*    AMPICILLIN >=32 RESISTANT Resistant     CEFAZOLIN <=4 SENSITIVE Sensitive     CEFEPIME <=0.12 SENSITIVE Sensitive     CEFTAZIDIME <=1 SENSITIVE Sensitive     CEFTRIAXONE <=0.25 SENSITIVE Sensitive     CIPROFLOXACIN <=0.25 SENSITIVE Sensitive     GENTAMICIN <=1 SENSITIVE Sensitive     IMIPENEM <=0.25 SENSITIVE Sensitive     TRIMETH/SULFA <=20 SENSITIVE Sensitive     AMPICILLIN/SULBACTAM 16 INTERMEDIATE Intermediate     PIP/TAZO <=4 SENSITIVE Sensitive     * RARE ESCHERICHIA COLI   Streptococcus anginosis - MIC*    PENICILLIN 0.12 SENSITIVE Sensitive     CEFTRIAXONE 0.5 SENSITIVE Sensitive     ERYTHROMYCIN 2 RESISTANT Resistant     LEVOFLOXACIN 0.5 SENSITIVE Sensitive     VANCOMYCIN 0.5 SENSITIVE Sensitive     * MODERATE STREPTOCOCCUS ANGINOSIS  Culture, blood (routine x 2)     Status: None   Collection Time: 08/27/20  6:10 PM    Specimen: BLOOD  Result Value Ref Range Status   Specimen Description BLOOD BLOOD LEFT HAND  Final   Special Requests   Final    BOTTLES DRAWN AEROBIC AND ANAEROBIC Blood Culture adequate volume   Culture   Final    NO GROWTH 5 DAYS Performed at [facility], [address] [place], [place] [address]    Report Status 09/01/2020 FINAL  Final  Culture, blood (routine  x 2)     Status: None   Collection Time: 08/27/20  6:26 PM   Specimen: BLOOD  Result Value Ref Range Status   Specimen Description BLOOD LEFT ANTECUBITAL  Final   Special Requests   Final    BOTTLES DRAWN AEROBIC AND ANAEROBIC Blood Culture adequate volume   Culture   Final    NO GROWTH 5 DAYS Performed at [facility], [address] [place], [place] [address]    Report Status 09/01/2020 FINAL  Final     Radiology Studies: US EKG SITE RITE  Result Date: 09/01/2020 If Site Rite image not attached, placement could not be confirmed due to current cardiac rhythm.   Scheduled Meds: . Chlorhexidine Gluconate Cloth  6 each Topical Q0600  . enoxaparin (LOVENOX) injection  40 mg Subcutaneous Q24H  . insulin aspart  0-15 Units Subcutaneous Q4H  . insulin glargine  24 Units Subcutaneous QHS  . metoprolol tartrate  5 mg Intravenous Q6H  . nicotine  7 mg Transdermal Daily  . pantoprazole (PROTONIX) IV  40 mg Intravenous QHS  . pregabalin  150 mg Oral QHS  . sodium chloride flush  10-40 mL Intracatheter Q12H  . tiZANidine  2 mg Oral QHS   Continuous Infusions: . sodium chloride Stopped (09/03/20 0059)  .  ceFAZolin (ANCEF) IV 2 g (09/03/20 1344)  . metronidazole 500 mg (09/03/20 1319)  . sodium phosphate  Dextrose 5% IVPB 30 mmol (09/03/20 1124)  . TPN ADULT (ION) 30 mL/hr at 09/03/20 1505  . TPN ADULT (ION)       LOS: 8 days    Arnetha CourserSumayya Raylan Troiani, MD [facility]  This record has been created using Conservation officer, historic buildingsDragon voice recognition software. Errors have been sought and corrected,but may not  always be located. Such creation errors do not reflect on the standard of care.  To contact the attending provider between 7A-7P or the covering provider during after hours 7P-7A, please log into the web site [email or web address] and access using universal [place] password for that web site. If you do not have the password, please call the hospital operator.  09/03/2020, 3:06 PM

## 2020-09-03 NOTE — Consult Note (Signed)
PHARMACY - TOTAL PARENTERAL NUTRITION CONSULT NOTE   Indication: Prolonged ileus  Patient Measurements: Height: 5\' 8"  (172.7 cm) Weight: 81.6 kg (179 lb 14.3 oz) IBW/kg (Calculated) : 68.4 TPN AdjBW (KG): 72.8 Body mass index is 27.35 kg/m.  Assessment:  Patient is a 75 y/o M with medical history including T2DM c/b neuropathy, HTN, HLD who presented to the ED 5/3 with RLQ pain and poor oral intake x 4 days. Patient subsequently found to have perforated appendicitis with abscess. IR placed drain into peri-appendiceal phlegmon on 5/4. Despite conservative measures, patient without clinical improvement and underwent laparoscopic appendectomy with partial right colectomy on 5/9. Pharmacy has been consulted to initiate TPN for prolonged ileus.   Glucose / Insulin: Hgb A1c 10.5%. Patient currently ordered moderate SSI + Lantus 24u HS. Glucose 241 - 336 last 24h. Required 53u of SSI yesterday. Electrolytes: Hypophosphatemia Renal: Scr improved to 0.84 today Hepatic: LFTs normalized Intake / Output; MIVF: NGT in place. Surgical drain in place. Net negative 1.5L for the admission. No MIVF GI Imaging: 5/3 CT abdomen / pelvis: Acute appendicitis, with periappendiceal abscess in the RLQ 5/6 CT abdomen / pelvis: Increased inflammation in the RLQ associated with the perforated appendicitis and periappendiceal abscesses. Persistent complex abscess collections in the right lower quadrant despite percutaneous drain. Increased inflammatory changes involving the distal ileum and terminal ileum  GI Surgeries / Procedures:  5/4: CT guided peri-appendiceal drain placement 5/10: Laparoscopic appendectomy, partial right colectomy  Central access: PICC placement 5/10 TPN start date: 5/10  Nutritional Goals (per RD recommendation on 5/10): kCal: 2000 - 2200 / day, Protein: 110 - 120 g/day, Fluid: 2000 mL - 2200 mL/day Goal TPN rate is 90 mL/hr (provides 118.8 g of protein and 2151 kcals per day)  Current  Nutrition:  NPO  Plan:  --Continue TPN at 30 mL/hr (1/3 goal rate) at 1800. Will not advance today given signs of re-feeding as evidenced by hypophosphatemia  Amino acid: 39.6 g  Dextrose: 100.8 g  Lipids: 21.6 g  Fluid: 720 mL --Electrolytes in TPN: Na 40mEq/L, K 64mEq/L, Ca 2mEq/L, Mg 44mEq/L, and Phos 75mmol/L. Cl:Ac 1:1  Sodium phosphate 30 mmol x 1 today --Add standard MVI and trace elements to TPN; add thiamine 100 mg and folic acid 1 mg x 3 days given re-feed risk --Continue Moderate q4h SSI + Lantus 24u HS. Will add 30u of insulin to TPN today --Monitor TPN labs on Mon/Thurs, daily until stable  12m 09/03/2020,9:41 AM

## 2020-09-03 NOTE — Progress Notes (Addendum)
Inpatient Diabetes Program Recommendations  AACE/ADA: New Consensus Statement on Inpatient Glycemic Control   Target Ranges:  Prepandial:   less than 140 mg/dL      Peak postprandial:   less than 180 mg/dL (1-2 hours)      Critically ill patients:  140 - 180 mg/dL   Results for ISAIAHS, CHANCY (MRN [id number]) as of 09/03/2020 08:33  Ref. Range 09/02/2020 07:36 09/02/2020 11:46 09/02/2020 15:57 09/02/2020 20:35 09/02/2020 23:22 09/03/2020 04:19 09/03/2020 07:42  Glucose-Capillary Latest Ref Range: 70 - 99 mg/dL 329 (H) 191 (H) 660 (H) 241 (H) 268 (H) 284 (H) 314 (H)   Review of Glycemic Control  Diabetes history: DM2 Outpatient Diabetes medications: 75/25 30 units QAM, 75/25 25 units QPM Current orders for Inpatient glycemic control: Lantus 24 units QHS, Novolog 0-15 units Q4H; NPO; TPN @ 30 ml/hr   Inpatient Diabetes Program Recommendations:    Insulin: Please consider adding Regular insulin 30 units to  TPN.  NOTE: Inpatient diabetes coordinator spoke with patient and his daughter on 08/27/20 regarding DM. Noted patient was started on TPN and glucose 314 mg/dl this morning. Recommend adding insulin to TPN.  Thanks, Orlando Penner, RN, MSN, CDE Diabetes Coordinator Inpatient Diabetes Program [phone] (Team Pager from 8am to 5pm)

## 2020-09-03 NOTE — Progress Notes (Signed)
[facility] SURGICAL PROGRESS NOTE  Hospital Day(s): 8.   Post op day(s): 2 Days Post-Op.   Interval History:  Patient seen and examined No acute events or new complaints overnight.  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  No fever, chills, nausea, emesis Leukocytosis is improving this morning; now 16.1K Maintaining his renal function; sCr - 0.84; UO - 675 ccs Mild hypophosphatemia to 1.3 o/w no significant electrolyte derangements  NGT with 200 ccs Surgical drain with 30 ccs: serosanguinous No flatus  Vital signs in last 24 hours: [min-max] current  Temp:  [98.3 F (36.8 C)-98.8 F (37.1 C)] 98.3 F (36.8 C) (05/11 0813) Pulse Rate:  [73-83] 73 (05/11 0813) Resp:  [16-18] 16 (05/11 0813) BP: (126-147)/(66-74) 147/74 (05/11 0813) SpO2:  [96 %-100 %] 97 % (05/11 0813)     Height: 5\' 8"  (172.7 cm) Weight: 81.6 kg BMI (Calculated): 27.36   Intake/Output last 2 shifts:  05/10 0701 - 05/11 0700 In: 733 [I.V.:474.1; IV Piggyback:258.9] Out: 905 [Urine:675; Emesis/NG output:200; Drains:30]   Physical Exam:  Constitutional: alert, cooperative and no distress  HEENT: NGT in palce Respiratory: breathing non-labored at rest  Cardiovascular: regular rate and sinus rhythm  Gastrointestinal: Soft, incisional soreness expectedly, non-distended, no rebound/guarding Integumentary: Laparotomy incision is CDI with staples and honeycomb, no erythema  Labs:  CBC Latest Ref Rng & Units 09/03/2020 09/02/2020 09/01/2020  WBC 4.0 - 10.5 K/uL 16.1(H) 22.7(H) 12.4(H)  Hemoglobin 13.0 - 17.0 g/dL 11.4(L) 14.0 14.9  Hematocrit 39.0 - 52.0 % 34.2(L) 43.1 45.1  Platelets 150 - 400 K/uL 352 395 353   CMP Latest Ref Rng & Units 09/03/2020 09/02/2020 09/01/2020  Glucose 70 - 99 mg/dL 11/01/2020) 272(Z) 366(Y)  BUN 8 - 23 mg/dL 403(K) 74(Q) 13  Creatinine 0.61 -  1.24 mg/dL 59(D 6.38 7.56  Sodium 135 - 145 mmol/L 140 139 140  Potassium 3.5 - 5.1 mmol/L 4.1 4.9 3.4(L)  Chloride 98 - 111 mmol/L 107 105 101  CO2 22 - 32 mmol/L 25 19(L) 20(L)  Calcium 8.9 - 10.3 mg/dL 7.9(L) 7.9(L) 8.9  Total Protein 6.5 - 8.1 g/dL 6.1(L) - 7.8  Total Bilirubin 0.3 - 1.2 mg/dL 0.6 - 1.6(H)  Alkaline Phos 38 - 126 U/L 56 - 84  AST 15 - 41 U/L 22 - 43(H)  ALT 0 - 44 U/L 25 - 38     Imaging studies: No new pertinent imaging studies   Assessment/Plan:  75 y.o. male  2 Days Post-Op s/p exploratory laparotomy and right hemicolectomy for perforated appendicitis.              - Continue TPN; monitor electrolytes, may be refeeding given low phosphorus, recommend maintaining current rate today             - Continue NGT decompression to LIS; monitor and record output             - NPO + IVF Resuscitation              - IV Abx; reviewed initial Cx data with pharmacy; narrow to Ancef + Flagyl               - Monitor abdominal examination - Pain control prn; antiemetics prn - Mobilization as tolerated; low threshold to engage PT             - Appreciate  medicine assistance with comorbid conditions  All of the above findings and recommendations were discussed with the patient, and the medical team, and all of patient's questions were answered to his expressed satisfaction.  -- Lynden Oxford, PA-C [facility] 09/03/2020, 9:53 AM [phone] M-F: 7am - 4pm

## 2020-09-03 NOTE — Plan of Care (Signed)

## 2020-09-04 LAB — COMPREHENSIVE METABOLIC PANEL
ALT: 19 U/L (ref 0–44)
AST: 21 U/L (ref 15–41)
Albumin: 2 g/dL — ABNORMAL LOW (ref 3.5–5.0)
Alkaline Phosphatase: 54 U/L (ref 38–126)
Anion gap: 10 (ref 5–15)
BUN: 18 mg/dL (ref 8–23)
CO2: 27 mmol/L (ref 22–32)
Calcium: 8 mg/dL — ABNORMAL LOW (ref 8.9–10.3)
Chloride: 107 mmol/L (ref 98–111)
Creatinine, Ser: 0.74 mg/dL (ref 0.61–1.24)
GFR, Estimated: >60 mL/min (ref >60)
Glucose, Bld: 133 mg/dL — ABNORMAL HIGH (ref 70–99)
Potassium: 3.2 mmol/L — ABNORMAL LOW (ref 3.5–5.1)
Sodium: 144 mmol/L (ref 135–145)
Total Bilirubin: 0.4 mg/dL (ref 0.3–1.2)
Total Protein: 6.3 g/dL — ABNORMAL LOW (ref 6.5–8.1)

## 2020-09-04 LAB — MAGNESIUM: Magnesium: 2.1 mg/dL (ref 1.7–2.4)

## 2020-09-04 LAB — GLUCOSE, CAPILLARY
Glucose-Capillary: 129 mg/dL — ABNORMAL HIGH (ref 70–99)
Glucose-Capillary: 123 mg/dL — ABNORMAL HIGH (ref 70–99)
Glucose-Capillary: 191 mg/dL — ABNORMAL HIGH (ref 70–99)
Glucose-Capillary: 243 mg/dL — ABNORMAL HIGH (ref 70–99)
Glucose-Capillary: 226 mg/dL — ABNORMAL HIGH (ref 70–99)

## 2020-09-04 LAB — PHOSPHORUS: Phosphorus: 2.3 mg/dL — ABNORMAL LOW (ref 2.5–4.6)

## 2020-09-04 LAB — SURGICAL PATHOLOGY

## 2020-09-04 MED ORDER — SODIUM CHLORIDE 0.9 % IV SOLN: INTRAVENOUS | Status: DC | PRN

## 2020-09-04 MED ORDER — TRAVASOL 10 % IV SOLN
INTRAVENOUS | Status: AC
  Filled 2020-09-04: qty 792

## 2020-09-04 MED ORDER — POTASSIUM PHOSPHATES 15 MMOLE/5ML IV SOLN
30.0000 mmol | Freq: Once | INTRAVENOUS | Status: AC
  Administered 2020-09-04: 30 mmol via INTRAVENOUS
  Filled 2020-09-04: qty 10

## 2020-09-04 NOTE — Progress Notes (Signed)
PT Cancellation Note  Patient Details Name: William Parks MRN: [id number] DOB: 1946-01-13   Cancelled Treatment:    Reason Eval/Treat Not Completed: Fatigue/lethargy limiting ability to participate.  Pt notes he is "too weak" to participate in therapy today.  Pt requested suggestions for decreasing back pain and pt was educated on weight shifts in chair and opted to not get into the bed.  Pillows placed underneath calves for pain relief and support in recliner.   Phineas Real 09/04/2020, 4:38 PM

## 2020-09-04 NOTE — Consult Note (Signed)
PHARMACY - TOTAL PARENTERAL NUTRITION CONSULT NOTE   Indication: Prolonged ileus  Patient Measurements: Height: 5\' 8"  (172.7 cm) Weight: 78.7 kg (173 lb 8 oz) IBW/kg (Calculated) : 68.4 TPN AdjBW (KG): 72.8 Body mass index is 26.38 kg/m.  Assessment:  Patient is a 75 y/o M with medical history including T2DM c/b neuropathy, HTN, HLD who presented to the ED 5/3 with RLQ pain and poor oral intake x 4 days. Patient subsequently found to have perforated appendicitis with abscess. IR placed drain into peri-appendiceal phlegmon on 5/4. Despite conservative measures, patient without clinical improvement and underwent laparoscopic appendectomy with partial right colectomy on 5/9. Pharmacy has been consulted to initiate TPN for prolonged ileus.   Glucose / Insulin: Hgb A1c 10.5%. Patient currently ordered moderate SSI + Lantus 30u HS (increased 5/11) + 30u insulin in TPN (added 5/11). Glucose 129 - 287 last 24h. Required 39u of SSI yesterday. Electrolytes: Hypokalemia, mild hypophosphatemia Renal: Scr improved to 0.74 today Hepatic: LFTs normalized Intake / Output; MIVF: NGT in place. Surgical drain in place. Net negative 1.3L for the admission. No MIVF GI Imaging: 5/3 CT abdomen / pelvis: Acute appendicitis, with periappendiceal abscess in the RLQ 5/6 CT abdomen / pelvis: Increased inflammation in the RLQ associated with the perforated appendicitis and periappendiceal abscesses. Persistent complex abscess collections in the right lower quadrant despite percutaneous drain. Increased inflammatory changes involving the distal ileum and terminal ileum  GI Surgeries / Procedures:  5/4: CT guided peri-appendiceal drain placement 5/10: Laparoscopic appendectomy, partial right colectomy  Central access: PICC placement 5/10 TPN start date: 5/10  Nutritional Goals (per RD recommendation on 5/10): kCal: 2000 - 2200 / day, Protein: 110 - 120 g/day, Fluid: 2000 mL - 2200 mL/day Goal TPN rate is 90 mL/hr  (provides 118.8 g of protein and 2151 kcals per day)  Current Nutrition:  NPO  Plan:  --Advance TPN to 60 mL/hr (2/3 goal rate) at 1800  Amino acid: 79.2 g  Dextrose: 201.6 g  Lipids: 43.2 g  Fluid: 1440 mL --Electrolytes in TPN: Na 65mEq/L, K 33mEq/L, Ca 83mEq/L, Mg 67mEq/L, and Phos 52mmol/L. Cl:Ac 1:1  Potassium phosphate 30 mmol x 1 today --Add standard MVI and trace elements to TPN; add thiamine 100 mg and folic acid 1 mg x 3 days given re-feed risk --Continue Moderate q4h SSI + Lantus 30u HS. Continue with 30u of insulin in TPN. Blood glucose controlled. Further modification pending response to increased glucose in new TPN bag --Monitor TPN labs on Mon/Thurs, daily until stable  12m 09/04/2020,7:44 AM

## 2020-09-04 NOTE — Progress Notes (Signed)
Pt's DM2 and BG have been managed based on recommendations from inpatient diabetes coordinator, which can continue.    Hospitalist consult will sign off, with agreement from Dr. Aleen Campi.

## 2020-09-04 NOTE — TOC Initial Note (Addendum)
Transition of Care [facility]) - Initial/Assessment Note    Patient Details  Name: William Parks MRN: [id number] Date of Birth: 08/19/1945  Transition of [facility]) CM/SW Contact:    Chapman Fitch, RN Phone Number: 09/04/2020, 3:12 PM  Clinical Narrative:                 Patient resting at this time Per chart review patient admitted with ruptured appendix  Patient lives at home with significant other  PCP [facility]  Prior to admission patient reported being independent.  .  Daughter lives in [place] OT recommending SNF.  PT eval pending   Patient currently still with TPN and NG tube.  At this time patient does not qualify for [facility] as he has not has a 3 day ICU stay  Expected Discharge Plan: Skilled Nursing Facility Barriers to Discharge: Continued Medical Work up   Patient Goals and CMS Choice        Expected Discharge Plan and Services Expected Discharge Plan: Skilled Nursing Facility                                              Prior Living Arrangements/Services   Lives with:: Significant Other          Need for Family Participation in Patient Care: Yes (Comment)   Current home services: DME Criminal Activity/Legal Involvement Pertinent to Current Situation/Hospitalization: No - Comment as needed  Activities of Daily Living Home Assistive Devices/Equipment: None ADL Screening (condition at time of admission) Patient's cognitive ability adequate to safely complete daily activities?: Yes Is the patient deaf or have difficulty hearing?: Yes Does the patient have difficulty seeing, even when wearing glasses/contacts?: Yes Does the patient have difficulty concentrating, remembering, or making decisions?: Yes Patient able to express need for assistance with ADLs?: Yes Does the patient have difficulty dressing or bathing?: No Independently performs ADLs?: Yes (appropriate for developmental age) Does the patient have difficulty walking or climbing  stairs?: No Weakness of Legs: None Weakness of Arms/Hands: None  Permission Sought/Granted                  Emotional Assessment         Alcohol / Substance Use: Not Applicable Psych Involvement: No (comment)  Admission diagnosis:  Ruptured appendicitis [K35.32] Acute appendicitis with localized peritonitis and abscess, unspecified whether gangrene present, unspecified whether perforation present [K35.33] Patient Active Problem List   Diagnosis Date Noted  . Ruptured appendicitis 08/26/2020  . DM2 (diabetes mellitus, type 2) (HCC) 08/26/2020  . DM (diabetes mellitus), type 2, uncontrolled (HCC) 08/26/2020  . Acute appendicitis with localized peritonitis and abscess   . Atherosclerosis of aorta (HCC) 10/03/2019  . Neck and shoulder pain 11/08/2016  . Tobacco abuse counseling 10/25/2016  . Vitamin D deficiency 10/15/2015  . Dyslipidemia 06/02/2015  . Hypertension 04/01/2015  . Uncontrolled type 2 diabetes mellitus with peripheral neuropathy (HCC) 04/01/2015   PCP:  Alba Cory, MD Pharmacy:   [id number] [place], [place] - [address] [place] [address] Phone: [phone] Fax: [phone]     Social Determinants of Health (SDOH) Interventions    Readmission Risk Interventions No flowsheet data found.

## 2020-09-04 NOTE — Progress Notes (Signed)
Pt. Alert and oriented. Post surgical procedure. Pt. denies pain. No sign of acute cardiac or respiratory distress. Pt. Refuse to use spirometer. Pt. Refuses to move and assist with physical mobility. Pt. was educated and encourage. Continue to monitor.

## 2020-09-04 NOTE — Evaluation (Signed)
Occupational Therapy Evaluation Patient Details Name: William Parks MRN: [id number] DOB: 07-25-1945 Today's Date: 09/04/2020    History of Present Illness 75 y/o M with medical history including T2DM c/b neuropathy, HTN, HLD who presented to the ED 5/3 with RLQ pain and poor oral intake x 4 days. Patient subsequently found to have perforated appendicitis with abscess. IR placed drain into peri-appendiceal phlegmon on 5/4. Despite conservative measures, patient without clinical improvement and underwent laparoscopic appendectomy with partial right colectomy on 5/9.   Clinical Impression   Patient presenting with decreased I in self care, balance, functional mobility/transfer, endurance, and safety awareness.  Patient reports living at home independently with wife at baseline PTA. 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   Patient seated in recliner chair with staff reporting +2 assist to get pt into chair. Pt needing cuing to attempt standing again from recliner chair with mod lifting assistance. Pt only able to stand for ~ 10 seconds and reports feeling "worn out". He is agreeable to sit up in recliner chair. Pt performs oral care with set up A and encouragement. Based on pt's current level of care OT recommends short term rehab stay to address functional deficits before returning home.  Patient will benefit from acute OT to increase overall independence in the areas of ADLs, functional mobility, and safety awareness in order to safely discharge to next venue of care.    Follow Up Recommendations  SNF;Supervision/Assistance - 24 hour    Equipment Recommendations  Other (comment) (defer to next venue of care)       Precautions / Restrictions Precautions Precautions: Fall      Mobility Bed Mobility               General bed mobility comments: pt seated in recliner chair at end of session    Transfers Overall transfer level: Needs  assistance Equipment used: 1 person hand held assist Transfers: Sit to/from Stand Sit to Stand: Mod assist         General transfer comment: from recliner chair with min cuing for technique and hand placement    Balance Overall balance assessment: Needs assistance Sitting-balance support: Feet supported Sitting balance-Leahy Scale: Fair     Standing balance support: During functional activity Standing balance-Leahy Scale: Poor                             ADL either performed or assessed with clinical judgement   ADL Overall ADL's : Needs assistance/impaired     Grooming: Oral care;Sitting;Set up               Lower Body Dressing: Moderate assistance;Sit to/from stand                       Vision Patient Visual Report: No change from baseline              Pertinent Vitals/Pain Pain Assessment: Faces Faces Pain Scale: Hurts whole lot Pain Location: abdomen Pain Descriptors / Indicators: Aching;Discomfort;Grimacing;Guarding Pain Intervention(s): Limited activity within patient's tolerance;Premedicated before session;Repositioned     Hand Dominance Right   Extremity/Trunk Assessment Upper Extremity Assessment Upper Extremity Assessment: Generalized weakness   Lower Extremity Assessment Lower Extremity Assessment: Generalized weakness       Communication Communication Communication: No difficulties   Cognition Arousal/Alertness: Awake/alert Behavior During Therapy: WFL for tasks assessed/performed Overall Cognitive Status: Within  Functional Limits for tasks assessed                                 General Comments: increased time to process information              Home Living Family/patient expects to be discharged to:: Private residence Living Arrangements: Spouse/significant other Available Help at Discharge: Family;Available 24 hours/day Type of Home: House Home Access: Stairs to enter [employer] of Steps: 2 STE Entrance Stairs-Rails: None       Bathroom Shower/Tub: Chief Strategy Officer: Standard     Home Equipment: Environmental consultant - standard          Prior Functioning/Environment Level of Independence: Independent        Comments: 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 He does report his daughter, in [place] , wants him to come and live with him.        OT Problem List: Decreased strength;Impaired balance (sitting and/or standing);Pain;Decreased cognition;Decreased safety awareness;Cardiopulmonary status limiting activity;Decreased activity tolerance;Decreased knowledge of use of DME or AE      OT Treatment/Interventions: Self-care/ADL training;Manual therapy;Therapeutic exercise;Patient/family education;Energy conservation;Therapeutic activities;Cognitive remediation/compensation;DME and/or AE instruction;Balance training;Modalities    OT Goals(Current goals can be found in the care plan section) Acute Rehab OT Goals Patient Stated Goal: to get better OT Goal Formulation: With patient Time For Goal Achievement: 09/18/20 Potential to Achieve Goals: Good ADL Goals Pt Will Perform Grooming: standing;with supervision Pt Will Perform Lower Body Dressing: with min guard assist;sit to/from stand Pt Will Transfer to Toilet: with min guard assist;ambulating Pt Will Perform Toileting - Clothing Manipulation and hygiene: with min guard assist;sit to/from stand  OT Frequency: Min 2X/week   Barriers to D/C:    none known at this time          AM-PAC OT "6 Clicks" Daily Activity     Outcome Measure Help from another person eating meals?: None Help from another person taking care of personal grooming?: A Little Help from another person toileting, which includes using toliet, bedpan, or urinal?: A Lot Help from another person bathing (including washing, rinsing, drying)?: A Lot Help from another  person to put on and taking off regular upper body clothing?: A Little Help from another person to put on and taking off regular lower body clothing?: A Lot 6 Click Score: 16   End of Session    Activity Tolerance: Patient limited by fatigue Patient left: in bed;with call bell/phone within reach;with chair alarm set  OT Visit Diagnosis: Unsteadiness on feet (R26.81);Muscle weakness (generalized) (M62.81);Pain Pain - part of body:  (abdomen)                Time: 0940-1000 OT Time Calculation (min): 20 min Charges:  OT General Charges $OT Visit: 1 Visit OT Evaluation $OT Eval Moderate Complexity: 1 Mod OT Treatments $Therapeutic Activity: 8-22 mins  Jackquline Denmark, MS, OTR/L , CBIS ascom [phone]  09/04/20, 12:51 PM

## 2020-09-04 NOTE — Progress Notes (Signed)
09/04/2020  Subjective: Patient is 3 Days Post-Op s/p right colectomy for perforated appendicitis. No acute events.  Patient has not been mobilizing much.  No bowel function yet.  NG with low output but no flatus or BM  Vital signs: Temp:  [98.2 F (36.8 C)-98.8 F (37.1 C)] 98.7 F (37.1 C) (05/12 0345) Pulse Rate:  [73-80] 76 (05/12 0508) Resp:  [16-18] 18 (05/12 0345) BP: (131-147)/(71-75) 145/72 (05/12 0508) SpO2:  [95 %-98 %] 98 % (05/12 0508) Weight:  [78.7 kg] 78.7 kg (05/12 0508)   Intake/Output: 05/11 0701 - 05/12 0700 In: 784.2 [I.V.:488.8; IV Piggyback:295.4] Out: 550 [Urine:450; Emesis/NG output:100] Last BM Date: 08/31/20  Physical Exam: Constitutional: No acute distress Abdomen:  Soft, non-distended, appropriately tender to palpation. Incision clean, dry, intact.  Drain with serosanguinous fluid.  Labs:  Recent Labs    09/02/20 0252 09/03/20 0513  WBC 22.7* 16.1*  HGB 14.0 11.4*  HCT 43.1 34.2*  PLT 395 352   Recent Labs    09/03/20 0513 09/04/20 0535  NA 140 144  K 4.1 3.2*  CL 107 107  CO2 25 27  GLUCOSE 336* 133*  BUN 24* 18  CREATININE 0.84 0.74  CALCIUM 7.9* 8.0*   No results for input(s): LABPROT, INR in the last 72 hours.  Imaging: No results found.  Assessment/Plan: This is a 75 y.o. male s/p right colectomy  --Continue NPO with NG to suction and TPN until return of bowel function. --continue IV abx -- changed to Ancef and flagyl based on cultures --OOB, must be out of bed today.  PT consult placed. --replete K and Phos today   Howie Ill, MD  [facility]

## 2020-09-05 LAB — BASIC METABOLIC PANEL
Anion gap: 7 (ref 5–15)
BUN: 24 mg/dL — ABNORMAL HIGH (ref 8–23)
CO2: 27 mmol/L (ref 22–32)
Calcium: 8.1 mg/dL — ABNORMAL LOW (ref 8.9–10.3)
Chloride: 106 mmol/L (ref 98–111)
Creatinine, Ser: 0.73 mg/dL (ref 0.61–1.24)
GFR, Estimated: >60 mL/min (ref >60)
Glucose, Bld: 233 mg/dL — ABNORMAL HIGH (ref 70–99)
Potassium: 3.6 mmol/L (ref 3.5–5.1)
Sodium: 140 mmol/L (ref 135–145)

## 2020-09-05 LAB — CBC
HCT: 29.7 % — ABNORMAL LOW (ref 39.0–52.0)
Hemoglobin: 9.8 g/dL — ABNORMAL LOW (ref 13.0–17.0)
MCH: 30.1 pg (ref 26.0–34.0)
MCHC: 33 g/dL (ref 30.0–36.0)
MCV: 91.1 fL (ref 80.0–100.0)
Platelets: 325 10*3/uL (ref 150–400)
RBC: 3.26 MIL/uL — ABNORMAL LOW (ref 4.22–5.81)
RDW: 15.4 % (ref 11.5–15.5)
WBC: 8.6 10*3/uL (ref 4.0–10.5)
nRBC: 0 % (ref 0.0–0.2)

## 2020-09-05 LAB — GLUCOSE, CAPILLARY
Glucose-Capillary: 176 mg/dL — ABNORMAL HIGH (ref 70–99)
Glucose-Capillary: 184 mg/dL — ABNORMAL HIGH (ref 70–99)
Glucose-Capillary: 187 mg/dL — ABNORMAL HIGH (ref 70–99)
Glucose-Capillary: 208 mg/dL — ABNORMAL HIGH (ref 70–99)
Glucose-Capillary: 217 mg/dL — ABNORMAL HIGH (ref 70–99)
Glucose-Capillary: 230 mg/dL — ABNORMAL HIGH (ref 70–99)
Glucose-Capillary: 249 mg/dL — ABNORMAL HIGH (ref 70–99)

## 2020-09-05 LAB — PHOSPHORUS: Phosphorus: 2.4 mg/dL — ABNORMAL LOW (ref 2.5–4.6)

## 2020-09-05 MED ORDER — TRAVASOL 10 % IV SOLN
INTRAVENOUS | Status: AC
  Filled 2020-09-05: qty 1188

## 2020-09-05 NOTE — Evaluation (Signed)
Physical Therapy Evaluation Patient Details Name: William Parks MRN: [id number] DOB: [birth year] Today's Date: 09/05/2020   History of Present Illness  75 y/o M with medical history including T2DM c/b neuropathy, HTN, HLD who presented to the ED 5/3 with RLQ pain and poor oral intake x 4 days. Patient subsequently found to have perforated appendicitis with abscess. IR placed drain into peri-appendiceal phlegmon on 5/4. Despite conservative measures, patient without clinical improvement and underwent laparoscopic appendectomy with partial right colectomy on 5/9.    Clinical Impression  Pt received seated in recliner upon arrival to room and pt agreeable to therapy.  Pt was able to perform seated exercises as noted below with good technique and not much difficulty.  Pt then performed STS with use of B UE support on hand rails.  Upon standing following bout of STS's, there was bloody stain on pillow behind pt's left side.  Pt examined for wound, however it was found that the port on the anterior abdomen of the left side was leaking.  Therapy was discontinued and nursing staff was notified by phone call/secure chat for further examination.  Due to weakness in LE's and difficulty with standing, pt will continue to benefit from skilled therapy to address weakness.  Pt will benefit from skilled PT intervention to increase independence and safety with basic mobility in preparation for discharge to the venue listed below.        Follow Up Recommendations SNF    Equipment Recommendations  Rolling walker with 5" wheels    Recommendations for Other Services       Precautions / Restrictions Precautions Precautions: Fall Restrictions Weight Bearing Restrictions: No      Mobility  Bed Mobility                    Transfers                    Ambulation/Gait                Stairs            Wheelchair Mobility    Modified Rankin (Stroke Patients Only)        Balance Overall balance assessment: Needs assistance Sitting-balance support: Feet supported Sitting balance-Leahy Scale: Fair     Standing balance support: Bilateral upper extremity supported Standing balance-Leahy Scale: Fair Standing balance comment: Pt has instability during standing and requires UE support                             Pertinent Vitals/Pain Pain Assessment: Faces Faces Pain Scale: Hurts whole lot Pain Location: abdomen Pain Descriptors / Indicators: Aching;Discomfort;Grimacing;Guarding Pain Intervention(s): Limited activity within patient's tolerance;Premedicated before session;Repositioned    Home Living Family/patient expects to be discharged to:: Private residence Living Arrangements: Spouse/significant other Available Help at Discharge: Family;Available 24 hours/day Type of Home: House Home Access: Stairs to enter Entrance Stairs-Rails: None Entrance Stairs-Number of Steps: 2 STE   Home Equipment: Walker - standard      Prior Function Level of Independence: Independent         Comments: 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 He does report his daughter, in [place] , wants him to come and live with him.     Hand Dominance   Dominant Hand: Right    Extremity/Trunk Assessment   Upper Extremity Assessment Upper Extremity Assessment: Generalized weakness  Lower Extremity Assessment Lower Extremity Assessment: Generalized weakness       Communication   Communication: No difficulties  Cognition Arousal/Alertness: Awake/alert Behavior During Therapy: WFL for tasks assessed/performed Overall Cognitive Status: Within Functional Limits for tasks assessed                                 General Comments: increased time to process information      General Comments      Exercises Total Joint Exercises Ankle Circles/Pumps: AROM;Strengthening;Both;10  reps;Seated Quad Sets: AROM;Strengthening;Both;10 reps;Seated Gluteal Sets: AROM;Strengthening;Both;10 reps;Seated Long Arc Quad: AROM;Strengthening;Both;10 reps;Seated Other Exercises Other Exercises: Pt educated on roles of PT and services provided during hospital stay.  Pt also encouraged to perform exercises in between bouts of therapy sessions.   Assessment/Plan    PT Assessment Patient needs continued PT services  PT Problem List Decreased strength;Decreased range of motion;Decreased activity tolerance;Decreased balance;Decreased mobility;Decreased knowledge of use of DME;Decreased safety awareness       PT Treatment Interventions DME instruction;Gait training;Stair training;Functional mobility training;Therapeutic activities;Therapeutic exercise;Balance training;Neuromuscular re-education;Patient/family education    PT Goals (Current goals can be found in the Care Plan section)  Acute Rehab PT Goals Patient Stated Goal: to get better PT Goal Formulation: With patient Time For Goal Achievement: 09/19/20 Potential to Achieve Goals: Fair    Frequency Min 2X/week   Barriers to discharge        Co-evaluation               AM-PAC PT "6 Clicks" Mobility  Outcome Measure Help needed turning from your back to your side while in a flat bed without using bedrails?: A Little Help needed moving from lying on your back to sitting on the side of a flat bed without using bedrails?: A Little Help needed moving to and from a bed to a chair (including a wheelchair)?: A Little Help needed standing up from a chair using your arms (e.g., wheelchair or bedside chair)?: A Little Help needed to walk in hospital room?: A Lot Help needed climbing 3-5 steps with a railing? : A Lot 6 Click Score: 16    End of Session   Activity Tolerance: Treatment limited secondary to medical complications (Comment) Patient left: in chair;with call bell/phone within reach   PT Visit Diagnosis:  Unsteadiness on feet (R26.81);Other abnormalities of gait and mobility (R26.89);Repeated falls (R29.6);Muscle weakness (generalized) (M62.81);History of falling (Z91.81);Difficulty in walking, not elsewhere classified (R26.2);Pain Pain - part of body:  (abdomen)    Time: 4008-6761 PT Time Calculation (min) (ACUTE ONLY): 25 min   Charges:   PT Evaluation $PT Eval Low Complexity: 1 Low PT Treatments $Therapeutic Exercise: 23-37 mins        Nolon Bussing, PT, DPT 09/05/20, 12:22 PM   William Parks 09/05/2020, 12:19 PM

## 2020-09-05 NOTE — Progress Notes (Signed)
[facility] SURGICAL PROGRESS NOTE  Hospital Day(s): 10.   Post op day(s): 4 Days Post-Op.   Interval History:  Patient seen and examined No acute events or new complaints overnight.  Patient reports still with incisional soreness and weakness No fever, chills, nausea, emesis His leukocytosis is now resolved; WBC 8.6K BMP is pending; UO - 625 ccs NGT output remains low; 100 ccs Surgical drain with 15 ccs out; serous No reports of flatus He did work with OT; recommending SNF; refused PT secondary to complaints of weakness  Vital signs in last 24 hours: [min-max] current  Temp:  [98 F (36.7 C)-99.1 F (37.3 C)] 98.4 F (36.9 C) (05/13 0412) Pulse Rate:  [69-77] 70 (05/13 0412) Resp:  [16-18] 16 (05/13 0412) BP: (103-147)/(56-78) 115/58 (05/13 0412) SpO2:  [97 %-99 %] 97 % (05/13 0412)     Height: 5\' 8"  (172.7 cm) Weight: 78.7 kg BMI (Calculated): 26.39   Intake/Output last 2 shifts:  05/12 0701 - 05/13 0700 In: 1399.6 [I.V.:721.9; IV Piggyback:677.7] Out: 740 [Urine:625; Emesis/NG output:100; Drains:15]   Physical Exam:  Constitutional: alert, cooperative and no distress HEENT: NGT in palce Respiratory: breathing non-labored at rest  Cardiovascular: regular rate and sinus rhythm  Gastrointestinal:Soft, incisional soreness expectedly, non-distended, no rebound/guarding. Surgical drain with serous fluid Integumentary:Laparotomy incision is CDI with staples and honeycomb, no erythema  Labs:  CBC Latest Ref Rng & Units 09/05/2020 09/03/2020 09/02/2020  WBC 4.0 - 10.5 K/uL 8.6 16.1(H) 22.7(H)  Hemoglobin 13.0 - 17.0 g/dL 11/02/2020) 11.4(L) 14.0  Hematocrit 39.0 - 52.0 % 29.7(L) 34.2(L) 43.1  Platelets 150 - 400 K/uL 325 352 395   CMP Latest Ref Rng & Units 09/04/2020 09/03/2020 09/02/2020  Glucose 70 - 99 mg/dL 11/02/2020) 034(V) 425(Z)  BUN 8 - 23 mg/dL 18 563(O) 75(I)  Creatinine 0.61 - 1.24 mg/dL 43(P 2.95 1.88  Sodium 135 - 145 mmol/L 144 140 139  Potassium 3.5  - 5.1 mmol/L 3.2(L) 4.1 4.9  Chloride 98 - 111 mmol/L 107 107 105  CO2 22 - 32 mmol/L 27 25 19(L)  Calcium 8.9 - 10.3 mg/dL 8.0(L) 7.9(L) 7.9(L)  Total Protein 6.5 - 8.1 g/dL 6.3(L) 6.1(L) -  Total Bilirubin 0.3 - 1.2 mg/dL 0.4 0.6 -  Alkaline Phos 38 - 126 U/L 54 56 -  AST 15 - 41 U/L 21 22 -  ALT 0 - 44 U/L 19 25 -     Imaging studies: No new pertinent imaging studies   Assessment/Plan:  75 y.o. male awaiting return of bowel function 4 Days Post-Op s/p exploratory laparotomy and right hemicolectomyfor perforated appendicitis.   - Continue TPN; monitor electrolytes   - Continue NGT decompression to LIS; monitor and record output - NPO + IVF Resuscitation - IV Abx; reviewed initial Cx data with pharmacy; narrow to Ancef + Flagyl  - Monitor abdominal examination - Pain control prn; antiemetics prn - Mobilization with therapies; recommending SNF; he understands that he should not refuse to work with therapies and that his lack of mobilization may be holding back his improvement - Appreciate medicine assistance with comorbid conditions   All of the above findings and recommendations were discussed with the patient, and the medical team, and all of patient's questions were answered to his expressed satisfaction.  -- 60, PA-C [facility] 09/05/2020, 7:14 AM [phone] M-F: 7am - 4pm

## 2020-09-05 NOTE — Progress Notes (Signed)
Occupational Therapy Treatment Patient Details Name: William Parks MRN: [id number] DOB: 01-07-1946 Today's Date: 09/05/2020    History of present illness 75 y/o M with medical history including T2DM c/b neuropathy, HTN, HLD who presented to the ED 5/3 with RLQ pain and poor oral intake x 4 days. Patient subsequently found to have perforated appendicitis with abscess. IR placed drain into peri-appendiceal phlegmon on 5/4. Despite conservative measures, patient without clinical improvement and underwent laparoscopic appendectomy with partial right colectomy on 5/9.   OT comments  William Parks presents today with generalized weakness and reduced endurance. He report his pain is improved since yesterday, but he is still extremely fatigued. William Parks has been sitting up in recliner for 4 hours prior to therapy session, and states that he wants to stay in recliner until bedtime -- he prefers to be in bed only for sleeping. Pt is able to stand with RW briefly and to engage in seated UE therex for ~ 2 minutes before requiring a rest break; repeated this sequence x 3. Mr. Brase also reports feeling "down," saying he finds it difficult to understand how he could feel so weak and sick now, when only last week he felt perfectly fine. He was despairing about what the future might hold for him, and vigorously stating that he did not want to go to a SNF. Provided therapeutic listening and reassured pt that a stay in rehab would likely be only short term. Pt indicated that he understood and that he was willing to work hard to regain his PLOF. Given pt's relatively good health and high level of physical activity (working in garden, [employer], home maintenance, etc.) prior to this illness, William Parks may be a good candidate for CIR, if he can make progress here over the next few days, enabling him to tolerate 3 hrs of therapy/day.   Follow Up Recommendations  SNF;Supervision/Assistance - 24 hour     Equipment Recommendations       Recommendations for Other Services      Precautions / Restrictions Precautions Precautions: Fall Restrictions Weight Bearing Restrictions: No       Mobility Bed Mobility Overal bed mobility: Needs Assistance             General bed mobility comments: pt received in recliner    Transfers Overall transfer level: Needs assistance Equipment used: 1 person hand held assist Transfers: Sit to/from Stand Sit to Stand: Mod assist         General transfer comment: able to stand only briefly, 2/2 fatigue    Balance Overall balance assessment: Needs assistance Sitting-balance support: Feet supported Sitting balance-Leahy Scale: Fair     Standing balance support: Bilateral upper extremity supported Standing balance-Leahy Scale: Fair Standing balance comment: Pt has instability during standing and requires UE support                           ADL either performed or assessed with clinical judgement   ADL Overall ADL's : Needs assistance/impaired Eating/Feeding: NPO   Grooming: Oral care;Sitting;Set up               Lower Body Dressing: Moderate assistance;Sitting/lateral leans                       Vision Patient Visual Report: No change from baseline     Perception     Praxis      Cognition Arousal/Alertness: Awake/alert Behavior During  Therapy: WFL for tasks assessed/performed Overall Cognitive Status: Within Functional Limits for tasks assessed                                 General Comments: depressed affect        Exercises Total Joint Exercises Ankle Circles/Pumps: AROM;Strengthening;Both;10 reps;Seated Quad Sets: AROM;Strengthening;Both;10 reps;Seated Gluteal Sets: AROM;Strengthening;Both;10 reps;Seated Long Arc Quad: AROM;Strengthening;Both;10 reps;Seated Other Exercises Other Exercises: Pt educated on roles of PT and services provided during hospital stay.  Pt also  encouraged to perform exercises in between bouts of therapy sessions. Other Exercises: UE therex from seated position   Shoulder Instructions       General Comments leakage from gastric tube, RN notified    Pertinent Vitals/ Pain       Pain Assessment: Faces Faces Pain Scale: Hurts little more Pain Location: abdomen Pain Descriptors / Indicators: Aching;Discomfort;Grimacing;Guarding Pain Intervention(s): Limited activity within patient's tolerance;Monitored during session;Repositioned  Home Living Family/patient expects to be discharged to:: Private residence Living Arrangements: Spouse/significant other Available Help at Discharge: Family;Available 24 hours/day Type of Home: House Home Access: Stairs to enter [employer] of Steps: 2 STE Entrance Stairs-Rails: None       Bathroom Shower/Tub: Chief Strategy Officer: Standard     Home Equipment: Environmental consultant - standard          Prior Functioning/Environment Level of Independence: Independent        Comments: 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 He does report his daughter, in [place] , wants him to come and live with him.   Frequency  Min 2X/week        Progress Toward Goals  OT Goals(current goals can now be found in the care plan section)  Progress towards OT goals: Progressing toward goals  Acute Rehab OT Goals Patient Stated Goal: to get better and to get back to gardening OT Goal Formulation: With patient Time For Goal Achievement: 09/18/20 Potential to Achieve Goals: Good  Plan Discharge plan remains appropriate    Co-evaluation                 AM-PAC OT "6 Clicks" Daily Activity     Outcome Measure   Help from another person eating meals?: None Help from another person taking care of personal grooming?: A Little Help from another person toileting, which includes using toliet, bedpan, or urinal?: A Lot Help from  another person bathing (including washing, rinsing, drying)?: A Lot Help from another person to put on and taking off regular upper body clothing?: A Little Help from another person to put on and taking off regular lower body clothing?: A Lot 6 Click Score: 16    End of Session    OT Visit Diagnosis: Unsteadiness on feet (R26.81);Muscle weakness (generalized) (M62.81);Pain   Activity Tolerance Patient limited by fatigue   Patient Left in chair;with call bell/phone within reach;with nursing/sitter in room   Nurse Communication          Time: 1130-1200 OT Time Calculation (min): 30 min  Charges: OT General Charges $OT Visit: 1 Visit OT Treatments $Self Care/Home Management : 8-22 mins $Therapeutic Exercise: 8-22 mins  Latina Craver, PhD, MS, OTR/L 09/05/20, 1:48 PM

## 2020-09-05 NOTE — Consult Note (Signed)
PHARMACY - TOTAL PARENTERAL NUTRITION CONSULT NOTE   Indication: Prolonged ileus  Patient Measurements: Height: 5\' 8"  (172.7 cm) Weight: 78.7 kg (173 lb 8 oz) IBW/kg (Calculated) : 68.4 TPN AdjBW (KG): 72.8 Body mass index is 26.38 kg/m.  Assessment:  Patient is a 75 y/o M with medical history including T2DM c/b neuropathy, HTN, HLD who presented to the ED 5/3 with RLQ pain and poor oral intake x 4 days. Patient subsequently found to have perforated appendicitis with abscess. IR placed drain into peri-appendiceal phlegmon on 5/4. Despite conservative measures, patient without clinical improvement and underwent laparoscopic appendectomy with partial right colectomy on 5/9. Pharmacy has been consulted to initiate TPN for prolonged ileus.   Glucose / Insulin: Hgb A1c 10.5%. Patient currently ordered moderate SSI + Lantus 30u HS (increased 5/11) + 30u insulin in TPN (added 5/11). Glucose 176 - 226 last 24h. Required 24u of SSI yesterday. Electrolytes: Mild hypophosphatemia Renal: Scr stable at 0.73 Hepatic: LFTs normalized Intake / Output; MIVF: NGT in place. Surgical drain in place. No MIVF GI Imaging: 5/3 CT abdomen / pelvis: Acute appendicitis, with periappendiceal abscess in the RLQ 5/6 CT abdomen / pelvis: Increased inflammation in the RLQ associated with the perforated appendicitis and periappendiceal abscesses. Persistent complex abscess collections in the right lower quadrant despite percutaneous drain. Increased inflammatory changes involving the distal ileum and terminal ileum  GI Surgeries / Procedures:  5/4: CT guided peri-appendiceal drain placement 5/10: Laparoscopic appendectomy, partial right colectomy  Central access: PICC placement 5/10 TPN start date: 5/10  Nutritional Goals (per RD recommendation on 5/10): kCal: 2000 - 2200 / day, Protein: 110 - 120 g/day, Fluid: 2000 mL - 2200 mL/day Goal TPN rate is 90 mL/hr (provides 118.8 g of protein and 2151 kcals per  day)  Current Nutrition:  NPO  Plan:  --Advance TPN to 90 mL/hr (goal rate) at 1800  Amino acid: 118.8 g  Dextrose: 302.4 g  Lipids: 64.8 g  Fluid: 2160 mL --Electrolytes in TPN: Na 76mEq/L, K 13mEq/L, Ca 42mEq/L, Mg 51mEq/L, and Phos 55mmol/L. Cl:Ac 1:1 --Add standard MVI and trace elements to TPN; completed thiamine 100 mg and folic acid 1 mg x 3 days given re-feed risk --Continue Moderate q4h SSI + Lantus 30u HS. Increase to 40u of insulin in TPN. Blood glucose control improved. Further modification pending response to increased glucose in new TPN bag --Monitor TPN labs on Mon/Thurs, daily until stable  12m 09/05/2020,7:40 AM

## 2020-09-06 LAB — GLUCOSE, CAPILLARY
Glucose-Capillary: 235 mg/dL — ABNORMAL HIGH (ref 70–99)
Glucose-Capillary: 251 mg/dL — ABNORMAL HIGH (ref 70–99)
Glucose-Capillary: 219 mg/dL — ABNORMAL HIGH (ref 70–99)
Glucose-Capillary: 245 mg/dL — ABNORMAL HIGH (ref 70–99)
Glucose-Capillary: 290 mg/dL — ABNORMAL HIGH (ref 70–99)
Glucose-Capillary: 264 mg/dL — ABNORMAL HIGH (ref 70–99)

## 2020-09-06 LAB — BASIC METABOLIC PANEL
Anion gap: 8 (ref 5–15)
BUN: 20 mg/dL (ref 8–23)
CO2: 26 mmol/L (ref 22–32)
Calcium: 8.1 mg/dL — ABNORMAL LOW (ref 8.9–10.3)
Chloride: 106 mmol/L (ref 98–111)
Creatinine, Ser: 0.79 mg/dL (ref 0.61–1.24)
GFR, Estimated: >60 mL/min (ref >60)
Glucose, Bld: 256 mg/dL — ABNORMAL HIGH (ref 70–99)
Potassium: 3.8 mmol/L (ref 3.5–5.1)
Sodium: 140 mmol/L (ref 135–145)

## 2020-09-06 LAB — PHOSPHORUS: Phosphorus: 1.9 mg/dL — ABNORMAL LOW (ref 2.5–4.6)

## 2020-09-06 MED ORDER — SODIUM CHLORIDE 0.9 % IV SOLN: INTRAVENOUS | Status: DC | PRN

## 2020-09-06 MED ORDER — TRAVASOL 10 % IV SOLN
INTRAVENOUS | Status: AC
  Filled 2020-09-06: qty 1188

## 2020-09-06 NOTE — Progress Notes (Signed)
09/06/2020  Subjective: Patient is 5 Days Post-Op status post diagnostic laparoscopy and open right colectomy for perforated appendicitis with abscess.  No acute events overnight.  Patient reports that this morning he started having flatus.  NG tube output has remained low.  He finally was able to be out of bed to the chair yesterday and work with physical therapy.  Today he does feel sore from this.  Vital signs: Temp:  [97.3 F (36.3 C)-99.3 F (37.4 C)] 98 F (36.7 C) (05/14 0933) Pulse Rate:  [73-81] 73 (05/14 0933) Resp:  [16-20] 20 (05/14 0933) BP: (132-150)/(68-81) 149/75 (05/14 0933) SpO2:  [94 %-97 %] 97 % (05/14 0933)   Intake/Output: 05/13 0701 - 05/14 0700 In: 3750.1 [P.O.:50; I.V.:2550.1; NG/GT:150; IV Piggyback:1000] Out: 1500 [Urine:1375; Emesis/NG output:50; Drains:75] Last BM Date: 08/31/20  Physical Exam: Constitutional: No acute distress Abdomen: Soft, mildly distended, appropriately tender to palpation.  Midline incision is clean, dry, intact with staples in place.  Left-sided drain with serosanguineous fluid.  Labs:  Recent Labs    09/05/20 0449  WBC 8.6  HGB 9.8*  HCT 29.7*  PLT 325   Recent Labs    09/05/20 1011 09/06/20 0518  NA 140 140  K 3.6 3.8  CL 106 106  CO2 27 26  GLUCOSE 233* 256*  BUN 24* 20  CREATININE 0.73 0.79  CALCIUM 8.1* 8.1*   No results for input(s): LABPROT, INR in the last 72 hours.  Imaging: No results found.  Assessment/Plan: This is a 75 y.o. male s/p diagnostic laparoscopy and open right colectomy.  - Given that the patient started to have flatus and he has remained with low NG output over the last couple of days, will do a clamping trial this morning.  If residuals less than 150 mL, then we will be able to DC the NG tube.  However given that he has just started to have flatus this morning, would limit him to ice chips or sips of water for now.  Should be okay to start advancing to clear liquid diet tomorrow. -  Continue TPN otherwise for now - Continue IV antibiotics for his intra-abdominal abscess that was drained during surgery. - Continue out of bed and ambulation as tolerated and working with physical therapy.   Howie Ill, MD [facility]

## 2020-09-06 NOTE — Progress Notes (Addendum)
NG tube clamped ~0900 by MD. Gastric residual checked, 50cc clear liquid. MD notified. Update: 1400 NG removed per order 1830 Patient unable to tolerate clear liquid dinner due to "not feeling well." Denied nausea/pain associated.  States "when I can have this bowel movement I think I can try something." States he feels like he will have one soon.

## 2020-09-06 NOTE — Consult Note (Signed)
PHARMACY - TOTAL PARENTERAL NUTRITION CONSULT NOTE   Indication: Prolonged ileus  Patient Measurements: Height: 5\' 8"  (172.7 cm) Weight: 78.7 kg (173 lb 8 oz) IBW/kg (Calculated) : 68.4 TPN AdjBW (KG): 72.8 Body mass index is 26.38 kg/m.  Assessment:  Patient is a 75 y/o M with medical history including T2DM c/b neuropathy, HTN, HLD who presented to the ED 5/3 with RLQ pain and poor oral intake x 4 days. Patient subsequently found to have perforated appendicitis with abscess. IR placed drain into peri-appendiceal phlegmon on 5/4. Despite conservative measures, patient without clinical improvement and underwent laparoscopic appendectomy with partial right colectomy on 5/9.   Pharmacy consulted to initiate TPN for prolonged ileus.   Glucose / Insulin: Hgb A1c 10.5%.   Patient currently ordered moderate SSI + Lantus 30u HS (increased 5/11) + 40u insulin in TPN (increased 5/13).   Glucose 184 - 256 last 24h. Required 31u of SSI yesterday.  Electrolytes: Hypophosphatemia - trending down 2.4>1.9 Renal: Scr stable < 1 Hepatic: LFTs normalized  Intake / Output; MIVF: I/O net 2.25L 5/13 (1 unmeasured), NGT in place.   Surgical drain in place. No MIVF.  LBM 5/8  GI Imaging: 5/3 CT abdomen / pelvis: Acute appendicitis, with periappendiceal abscess in the RLQ 5/6 CT abdomen / pelvis: Increased inflammation in the RLQ associated with the perforated appendicitis and periappendiceal abscesses. Persistent complex abscess collections in the right lower quadrant despite percutaneous drain. Increased inflammatory changes involving the distal ileum and terminal ileum  GI Surgeries / Procedures:  5/4: CT guided peri-appendiceal drain placement 5/10: Laparoscopic appendectomy, partial right colectomy  Central access: PICC placement 5/10 TPN start date: 5/10  Nutritional Goals (per RD recommendation on 5/10): kCal: 2000 - 2200 / day, Protein: 110 - 120 g/day, Fluid: 2000 mL - 2200 mL/day  Goal  TPN rate is 90 mL/hr (provides 118.8 g of protein and 2151 kcals per day)  Current Nutrition:  NPO  Plan:  --Continue TPN @ 90 mL/hr (goal rate) at 1800  Amino acid: 118.8 g  Dextrose: 302.4 g  Lipids: 64.8 g  Fluid: 2160 mL  --Electrolytes in TPN: Na 76mEq/L, K 23mEq/L, Ca 20mEq/L, Mg 75mEq/L, and increase Phos to 36mmol/L (was 65mmol/L) Cl:Ac 1:1  --Add standard MVI and trace elements to TPN; completed thiamine 100 mg and folic acid 1 mg x 3 days given re-feed risk  --Continue Moderate q4h SSI + Lantus 30u HS. Increase to 50u of insulin in TPN. Blood glucose control improved. Further modification pending response to increased glucose in new TPN bag  --Monitor TPN labs on Mon/Thurs, daily until stable  12m, PharmD, BCPS Clinical Pharmacist 09/06/2020 7:33 AM

## 2020-09-06 NOTE — Progress Notes (Signed)
Occupational Therapy Treatment Patient Details Name: Jonas Goh MRN: [id number] DOB: 09/12/1945 Today's Date: 09/06/2020    History of present illness 75 y/o M with medical history including T2DM c/b neuropathy, HTN, HLD who presented to the ED 5/3 with RLQ pain and poor oral intake x 4 days. Patient subsequently found to have perforated appendicitis with abscess. IR placed drain into peri-appendiceal phlegmon on 5/4. Despite conservative measures, patient without clinical improvement and underwent laparoscopic appendectomy with partial right colectomy on 5/9.   OT comments  Mr. Karbowski continues to present with generalized weakness, reduced endurance, and pain; however, he appears less ill and more engaged than during yesterday's session, and demonstrates good motivation to improve (e.g., I began to assist Mr. Ballin with his supine<sit, when he became tired during this movement. But Mr. Moccia immediately stopped me, saying, "no, I can do this by myself.") Pt engaged in seated LE therex, again displaying good motivation. I suggested to Mr. Manseau that he do 5-7 reps of each exercise, but he said, "let's go for 10." Pt transferred to recliner at end of session and stated that he planned to sit up until evening -- that he wanted to be in bed only for sleeping. Mr. Rasmusson does appear somewhat depressed, frequently and pensively asking questions such as, "do you think I can get better?" While standing with RW, pt expressed fear of falling, saying that his legs felt very weak. If Mr. Egelston continues to stay motivated and makes good progress, he could be a candidate for CIR. Otherwise, would require SNF for STR before he is safe to go home alone. Will continue to follow POC and track pt's progress.   Follow Up Recommendations  SNF;Supervision/Assistance - 24 hour;Other (comment) (CIR?)    Equipment Recommendations       Recommendations for Other Services      Precautions / Restrictions  Precautions Precautions: Fall Precaution Comments: NG tube, gastric tube, IV TPN Restrictions Weight Bearing Restrictions: No       Mobility Bed Mobility Overal bed mobility: Needs Assistance Bed Mobility: Supine to Sit     Supine to sit: Min guard     General bed mobility comments: pt required greatly increased time, but no physcial assist, to come into sitting    Transfers Overall transfer level: Needs assistance Equipment used: Rolling walker (2 wheeled) Transfers: Sit to/from Stand Sit to Stand: Min guard              Balance Overall balance assessment: Needs assistance Sitting-balance support: Feet supported Sitting balance-Leahy Scale: Good Sitting balance - Comments: ablet to tolerate sitting EOB for ~ 10 minutes   Standing balance support: Bilateral upper extremity supported Standing balance-Leahy Scale: Good Standing balance comment: pt expresses fear of falling, saying that he feels "weak," but has no LOB                           ADL either performed or assessed with clinical judgement   ADL Overall ADL's : Needs assistance/impaired Eating/Feeding: NPO   Grooming: Oral care;Sitting;Set up   Upper Body Bathing: Modified independent;Set up                                   Vision Patient Visual Report: No change from baseline     Perception     Praxis      Cognition Arousal/Alertness: Awake/alert Behavior  During Therapy: WFL for tasks assessed/performed Overall Cognitive Status: Within Functional Limits for tasks assessed                                 General Comments: depressed affect        Exercises Total Joint Exercises Ankle Circles/Pumps: AROM;Strengthening;Both;10 reps;Seated Quad Sets: AROM;Strengthening;Both;10 reps;Seated Gluteal Sets: AROM;Strengthening;Both;10 reps;Seated Long Arc Quad: AROM;Strengthening;Both;10 reps;Seated Other Exercises Other Exercises: therex in sitting,  grooming, bed mobility, transfers, standing balance tolerance   Shoulder Instructions       General Comments      Pertinent Vitals/ Pain       Pain Score: 4  Pain Location: abdomen Pain Descriptors / Indicators: Aching;Discomfort;Grimacing;Guarding Pain Intervention(s): Limited activity within patient's tolerance;Monitored during session;Repositioned  Home Living                                          Prior Functioning/Environment              Frequency  Min 2X/week        Progress Toward Goals  OT Goals(current goals can now be found in the care plan section)  Progress towards OT goals: Progressing toward goals  Acute Rehab OT Goals Patient Stated Goal: to get better and to get back to gardening OT Goal Formulation: With patient Time For Goal Achievement: 09/18/20 Potential to Achieve Goals: Good  Plan Discharge plan remains appropriate;Other (comment) (consider CIR?)    Co-evaluation                 AM-PAC OT "6 Clicks" Daily Activity     Outcome Measure   Help from another person eating meals?: None Help from another person taking care of personal grooming?: None Help from another person toileting, which includes using toliet, bedpan, or urinal?: A Lot Help from another person bathing (including washing, rinsing, drying)?: A Lot Help from another person to put on and taking off regular upper body clothing?: A Little Help from another person to put on and taking off regular lower body clothing?: A Little 6 Click Score: 18    End of Session Equipment Utilized During Treatment: Rolling walker  OT Visit Diagnosis: Unsteadiness on feet (R26.81);Muscle weakness (generalized) (M62.81);Pain   Activity Tolerance Patient tolerated treatment well   Patient Left in chair;with call bell/phone within reach;with family/visitor present   Nurse Communication          Time: 6256-3893 OT Time Calculation (min): 56 min  Charges: OT  General Charges $OT Visit: 1 Visit OT Treatments $Self Care/Home Management : 23-37 mins $Therapeutic Activity: 8-22 mins $Therapeutic Exercise: 8-22 mins  Latina Craver, PhD, MS, OTR/L 09/06/20, 2:53 PM

## 2020-09-07 LAB — BASIC METABOLIC PANEL
Anion gap: 5 (ref 5–15)
BUN: 21 mg/dL (ref 8–23)
CO2: 26 mmol/L (ref 22–32)
Calcium: 8 mg/dL — ABNORMAL LOW (ref 8.9–10.3)
Chloride: 107 mmol/L (ref 98–111)
Creatinine, Ser: 0.72 mg/dL (ref 0.61–1.24)
GFR, Estimated: >60 mL/min (ref >60)
Glucose, Bld: 231 mg/dL — ABNORMAL HIGH (ref 70–99)
Potassium: 4 mmol/L (ref 3.5–5.1)
Sodium: 138 mmol/L (ref 135–145)

## 2020-09-07 LAB — GLUCOSE, CAPILLARY
Glucose-Capillary: 211 mg/dL — ABNORMAL HIGH (ref 70–99)
Glucose-Capillary: 227 mg/dL — ABNORMAL HIGH (ref 70–99)
Glucose-Capillary: 229 mg/dL — ABNORMAL HIGH (ref 70–99)
Glucose-Capillary: 238 mg/dL — ABNORMAL HIGH (ref 70–99)
Glucose-Capillary: 240 mg/dL — ABNORMAL HIGH (ref 70–99)
Glucose-Capillary: 245 mg/dL — ABNORMAL HIGH (ref 70–99)
Glucose-Capillary: 259 mg/dL — ABNORMAL HIGH (ref 70–99)

## 2020-09-07 LAB — MAGNESIUM: Magnesium: 1.8 mg/dL (ref 1.7–2.4)

## 2020-09-07 LAB — PHOSPHORUS: Phosphorus: 2.4 mg/dL — ABNORMAL LOW (ref 2.5–4.6)

## 2020-09-07 MED ORDER — TRAVASOL 10 % IV SOLN
INTRAVENOUS | Status: AC
  Filled 2020-09-07: qty 1188

## 2020-09-07 NOTE — Progress Notes (Signed)
09/07/2020  Subjective: Patient is 6 Days Post-Op status post diagnostic laparoscopy and open right colectomy for perforated appendicitis with abscess.  No acute events overnight.  Patient's NG tube was removed yesterday and started on a clear liquid diet for dinner.  He had 2 bowel movements last night.  Patient reports feeling much better and appears some better spirits.  When I walked into the room he was working with physical therapy.  He reports that yesterday he walked to the bathroom.  Vital signs: Temp:  [98.3 F (36.8 C)-99 F (37.2 C)] 98.4 F (36.9 C) (05/15 0737) Pulse Rate:  [75-78] 75 (05/15 0737) Resp:  [16-18] 16 (05/15 0737) BP: (124-139)/(68-79) 132/70 (05/15 0737) SpO2:  [97 %-99 %] 98 % (05/15 0737)   Intake/Output: 05/14 0701 - 05/15 0700 In: 2246.6 [I.V.:1661.1; IV Piggyback:585.5] Out: 900 [Urine:800; Drains:100] Last BM Date: 09/06/20  Physical Exam: Constitutional: No acute distress Abdomen: Soft, non-distended, appropriately tender to palpation.  Midline incision is clean, dry, intact with staples in place.  Left-sided drain with serosanguineous fluid.  Labs:  Recent Labs    09/05/20 0449  WBC 8.6  HGB 9.8*  HCT 29.7*  PLT 325   Recent Labs    09/06/20 0518 09/07/20 0755  NA 140 138  K 3.8 4.0  CL 106 107  CO2 26 26  GLUCOSE 256* 231*  BUN 20 21  CREATININE 0.79 0.72  CALCIUM 8.1* 8.0*   No results for input(s): LABPROT, INR in the last 72 hours.  Imaging: No results found.  Assessment/Plan: This is a 75 y.o. male s/p diagnostic laparoscopy and open right colectomy.  - Patient is tolerating a clear liquid diet and continues to have bowel function.  We will advance to full liquid diet today. - Continue TPN for now until his diet is fully advanced. - Continue IV antibiotics for his intra-abdominal abscess that was drained during surgery.  Likely will be able to DC antibiotics tomorrow. - Continue out of bed and ambulation as tolerated  and working with physical therapy.   William Ill, MD [facility]

## 2020-09-07 NOTE — Progress Notes (Signed)
Physical Therapy Treatment Patient Details Name: William Parks MRN: [id number] DOB: July 25, 1945 Today's Date: 09/07/2020    History of Present Illness 75 y/o M with medical history including T2DM c/b neuropathy, HTN, HLD who presented to the ED 5/3 with RLQ pain and poor oral intake x 4 days. Patient subsequently found to have perforated appendicitis with abscess. IR placed drain into peri-appendiceal phlegmon on 5/4. Despite conservative measures, patient without clinical improvement and underwent laparoscopic appendectomy with partial right colectomy on 5/9.    PT Comments    The pt demonstrates improved tolerance to mobility and more controlled pain with mobility this session. He progresses to ambulation in the room and is appropriate for further gait training during next sessions. The pt is highly motivated to return to a home setting and receive HHPT. He is understanding that he is not yet appropriate for this d/c plan, but is willing to continue to progress towards reaching that goal. At this time he continues to present with activity tolerance and endurance deficits, limiting his independence with functional mobility. PT will continue to follow.     Follow Up Recommendations  SNF (Pt educated that with continued progression a plan outside of SNF could be developed, however he would require home support. Pt reports that he may be going to live with his daughter that is a Charity fundraiser in [place].)     Engineer, agricultural with 5" wheels    Recommendations for Other Services       Precautions / Restrictions Precautions Precautions: Fall    Mobility  Bed Mobility Overal bed mobility: Needs Assistance (Pt reports use of adjustable bed at baseline, use of bed to achieve EOB.) Bed Mobility: Supine to Sit           General bed mobility comments: verbal cues to limit pulling and crunching to upright position to limit pain in abdomen.    Transfers Overall transfer  level: Needs assistance Equipment used: None Transfers: Sit to/from [employer] Sit to Stand: Min guard Stand pivot transfers: Min guard          Ambulation/Gait Ambulation/Gait assistance: Min guard Gait Distance (Feet): 30 Feet Assistive device: Rolling walker (2 wheeled)       General Gait Details: Pt required cues for learning how to use AD for consistent rolling during ambulation.   Stairs             Wheelchair Mobility    Modified Rankin (Stroke Patients Only)       Balance Overall balance assessment: Needs assistance Sitting-balance support: Feet supported Sitting balance-Leahy Scale: Good     Standing balance support: Bilateral upper extremity supported Standing balance-Leahy Scale: Good                              Cognition Arousal/Alertness: Awake/alert Behavior During Therapy: WFL for tasks assessed/performed Overall Cognitive Status: Within Functional Limits for tasks assessed                                 General Comments: Not demonstrating s/s signficant for depression at this time.      Exercises Total Joint Exercises Ankle Circles/Pumps: AROM;Strengthening;Both;10 reps;Seated Long Arc Quad: AROM;Strengthening;Both;10 reps;Seated Marching in Standing: AROM;10 reps;Both;Standing;Strengthening    General Comments        Pertinent Vitals/Pain Pain Assessment: No/denies pain Pain Location:  (Pt not reporting pain,  however is hesistant to mobility d/t soreness from previous session. He is tender in his incision site, but does not report pain.)    Home Living                      Prior Function            PT Goals (current goals can now be found in the care plan section) Acute Rehab PT Goals Patient Stated Goal: to get better and to get back to gardening PT Goal Formulation: With patient Time For Goal Achievement: 09/19/20 Potential to Achieve Goals: Good Progress towards PT  goals: Progressing toward goals    Frequency    Min 2X/week      PT Plan Current plan remains appropriate    Co-evaluation              AM-PAC PT "6 Clicks" Mobility   Outcome Measure  Help needed turning from your back to your side while in a flat bed without using bedrails?: A Little Help needed moving from lying on your back to sitting on the side of a flat bed without using bedrails?: A Little Help needed moving to and from a bed to a chair (including a wheelchair)?: A Little Help needed standing up from a chair using your arms (e.g., wheelchair or bedside chair)?: A Little Help needed to walk in hospital room?: A Little Help needed climbing 3-5 steps with a railing? : A Little 6 Click Score: 18    End of Session   Activity Tolerance: Patient tolerated treatment well Patient left: in chair;with call bell/phone within reach Nurse Communication: Mobility status PT Visit Diagnosis: Unsteadiness on feet (R26.81);Other abnormalities of gait and mobility (R26.89);Muscle weakness (generalized) (M62.81);History of falling (Z91.81);Difficulty in walking, not elsewhere classified (R26.2)     Time: 3875-6433 PT Time Calculation (min) (ACUTE ONLY): 49 min  Charges:  $Gait Training: 23-37 mins $Therapeutic Exercise: 8-22 mins                     10:09 AM, 09/07/20 [name] A. Mordecai Maes PT, DPT Physical Therapist - [facility]    [name] A [name] 09/07/2020, 10:07 AM

## 2020-09-07 NOTE — Consult Note (Signed)
PHARMACY - TOTAL PARENTERAL NUTRITION CONSULT NOTE   Indication: Prolonged ileus  Patient Measurements: Height: 5\' 8"  (172.7 cm) Weight: 78.7 kg (173 lb 8 oz) IBW/kg (Calculated) : 68.4 TPN AdjBW (KG): 72.8 Body mass index is 26.38 kg/m.  Assessment:  Patient is a 75 y/o M with medical history including T2DM c/b neuropathy, HTN, HLD who presented to the ED 5/3 with RLQ pain and poor oral intake x 4 days. Patient subsequently found to have perforated appendicitis with abscess. IR placed drain into peri-appendiceal phlegmon on 5/4. Despite conservative measures, patient without clinical improvement and underwent laparoscopic appendectomy with partial right colectomy on 5/9.   Pharmacy consulted to initiate TPN for prolonged ileus.   Glucose / Insulin: Hgb A1c 10.5%.   Patient currently ordered moderate SSI + Lantus 30u HS (increased 5/11) + 50u insulin in TPN (increased 5/14).   Glucose 219 - 264 last 24h. Required 36u of SSI yesterday.  Electrolytes: Hypophosphatemia - trending up 2.4>1.9>2.4 Renal: Scr stable < 1 Hepatic: LFTs normalized  Intake / Output; MIVF: I/O net 1.3L 5/14 (1 unmeasured), NGT in place.   Surgical drain in place. No MIVF.  LBM 5/8  GI Imaging: 5/3 CT abdomen / pelvis: Acute appendicitis, with periappendiceal abscess in the RLQ 5/6 CT abdomen / pelvis: Increased inflammation in the RLQ associated with the perforated appendicitis and periappendiceal abscesses. Persistent complex abscess collections in the right lower quadrant despite percutaneous drain. Increased inflammatory changes involving the distal ileum and terminal ileum  GI Surgeries / Procedures:  5/4: CT guided peri-appendiceal drain placement 5/10: Laparoscopic appendectomy, partial right colectomy  Central access: PICC placement 5/10 TPN start date: 5/10  Nutritional Goals (per RD recommendation on 5/10): kCal: 2000 - 2200 / day, Protein: 110 - 120 g/day, Fluid: 2000 mL - 2200 mL/day  Goal  TPN rate is 90 mL/hr (provides 118.8 g of protein and 2151 kcals per day)  Current Nutrition:  NPO  Plan:  --Continue TPN @ 90 mL/hr (goal rate) at 1800  Amino acid: 118.8 g  Dextrose: 302.4 g  Lipids: 64.8 g  Fluid: 2160 mL  --Electrolytes in TPN: Na 31mEq/L, K 21mEq/L, Ca 6mEq/L, Mg 53mEq/L, and reduce Phos to 25mmol/L (was 47mmol/L) Cl:Ac 1:1  --Add standard MVI and trace elements to TPN; completed thiamine 100 mg and folic acid 1 mg x 3 days given re-feed risk  --Continue Moderate q4h SSI + Lantus 30u HS. Increase to 60u of insulin in TPN. Blood glucose control improved. Further modification pending response to increased glucose in new TPN bag  --Monitor TPN labs on Mon/Thurs, daily until stable  37m, PharmD, BCPS Clinical Pharmacist 09/07/2020 7:30 AM

## 2020-09-08 LAB — COMPREHENSIVE METABOLIC PANEL
ALT: 14 U/L (ref 0–44)
AST: 30 U/L (ref 15–41)
Albumin: 2 g/dL — ABNORMAL LOW (ref 3.5–5.0)
Alkaline Phosphatase: 43 U/L (ref 38–126)
Anion gap: 6 (ref 5–15)
BUN: 20 mg/dL (ref 8–23)
CO2: 25 mmol/L (ref 22–32)
Calcium: 8 mg/dL — ABNORMAL LOW (ref 8.9–10.3)
Chloride: 106 mmol/L (ref 98–111)
Creatinine, Ser: 0.8 mg/dL (ref 0.61–1.24)
GFR, Estimated: >60 mL/min (ref >60)
Glucose, Bld: 212 mg/dL — ABNORMAL HIGH (ref 70–99)
Potassium: 4 mmol/L (ref 3.5–5.1)
Sodium: 137 mmol/L (ref 135–145)
Total Bilirubin: 0.3 mg/dL (ref 0.3–1.2)
Total Protein: 6 g/dL — ABNORMAL LOW (ref 6.5–8.1)

## 2020-09-08 LAB — GLUCOSE, CAPILLARY
Glucose-Capillary: 227 mg/dL — ABNORMAL HIGH (ref 70–99)
Glucose-Capillary: 190 mg/dL — ABNORMAL HIGH (ref 70–99)
Glucose-Capillary: 214 mg/dL — ABNORMAL HIGH (ref 70–99)
Glucose-Capillary: 238 mg/dL — ABNORMAL HIGH (ref 70–99)
Glucose-Capillary: 218 mg/dL — ABNORMAL HIGH (ref 70–99)
Glucose-Capillary: 196 mg/dL — ABNORMAL HIGH (ref 70–99)

## 2020-09-08 LAB — DIFFERENTIAL
Abs Immature Granulocytes: 0.09 10*3/uL — ABNORMAL HIGH (ref 0.00–0.07)
Basophils Absolute: 0 10*3/uL (ref 0.0–0.1)
Basophils Relative: 0 %
Eosinophils Absolute: 0.3 10*3/uL (ref 0.0–0.5)
Eosinophils Relative: 3 %
Immature Granulocytes: 1 %
Lymphocytes Relative: 16 %
Lymphs Abs: 1.5 10*3/uL (ref 0.7–4.0)
Monocytes Absolute: 0.7 10*3/uL (ref 0.1–1.0)
Monocytes Relative: 8 %
Neutro Abs: 6.7 10*3/uL (ref 1.7–7.7)
Neutrophils Relative %: 72 %

## 2020-09-08 LAB — CBC
HCT: 29.9 % — ABNORMAL LOW (ref 39.0–52.0)
Hemoglobin: 10.1 g/dL — ABNORMAL LOW (ref 13.0–17.0)
MCH: 29.2 pg (ref 26.0–34.0)
MCHC: 33.8 g/dL (ref 30.0–36.0)
MCV: 86.4 fL (ref 80.0–100.0)
Platelets: 294 10*3/uL (ref 150–400)
RBC: 3.46 MIL/uL — ABNORMAL LOW (ref 4.22–5.81)
RDW: 14.7 % (ref 11.5–15.5)
WBC: 9.3 10*3/uL (ref 4.0–10.5)
nRBC: 0 % (ref 0.0–0.2)

## 2020-09-08 LAB — MAGNESIUM: Magnesium: 1.8 mg/dL (ref 1.7–2.4)

## 2020-09-08 LAB — PREALBUMIN: Prealbumin: 16.6 mg/dL — ABNORMAL LOW (ref 18–38)

## 2020-09-08 LAB — PHOSPHORUS: Phosphorus: 2.6 mg/dL (ref 2.5–4.6)

## 2020-09-08 LAB — TRIGLYCERIDES: Triglycerides: 100 mg/dL (ref <150)

## 2020-09-08 MED ORDER — INSULIN GLARGINE 100 UNIT/ML ~~LOC~~ SOLN
40.0000 [IU] | Freq: Every day | SUBCUTANEOUS | Status: DC
  Administered 2020-09-08: 40 [IU] via SUBCUTANEOUS
  Filled 2020-09-08 (×2): qty 0.4

## 2020-09-08 MED ORDER — TRAVASOL 10 % IV SOLN
INTRAVENOUS | Status: AC
  Filled 2020-09-08: qty 1188

## 2020-09-08 MED ORDER — MAGNESIUM SULFATE 2 GM/50ML IV SOLN
2.0000 g | Freq: Once | INTRAVENOUS | Status: AC
  Administered 2020-09-08: 2 g via INTRAVENOUS
  Filled 2020-09-08: qty 50

## 2020-09-08 NOTE — Progress Notes (Signed)
Inpatient Diabetes Program Recommendations  AACE/ADA: New Consensus Statement on Inpatient Glycemic Control (2015)  Target Ranges:  Prepandial:   less than 140 mg/dL      Peak postprandial:   less than 180 mg/dL (1-2 hours)      Critically ill patients:  140 - 180 mg/dL   Lab Results  Component Value Date   GLUCAP 214 (H) 09/08/2020   HGBA1C 10.5 (H) 08/27/2020    Review of Glycemic Control Results for William Parks, William Parks (MRN [id number]) as of 09/08/2020 14:22  Ref. Range 09/07/2020 23:54 09/08/2020 04:54 09/08/2020 07:23 09/08/2020 11:33  Glucose-Capillary Latest Ref Range: 70 - 99 mg/dL 320 (H) 233 (H) 435 (H) 214 (H)   Diabetes history: DM 2 Outpatient Diabetes medications:  Humalog 75/25 30 units in the AM and 25 units with dinner Current orders for Inpatient glycemic control:  Lantus 30 units daily Novolog moderate tid with meals TPN with 60 units/liter Inpatient Diabetes Program Recommendations:   Consider slight increase of Lantus to 40 units daily.  Thanks Beryl Meager, RN, BC-ADM Inpatient Diabetes Coordinator Pager [phone] (8a-5p)

## 2020-09-08 NOTE — TOC Transition Note (Signed)
Transition of Care [facility]) - CM/SW Discharge Note   Patient Details  Name: William Parks MRN: [id number] Date of Birth: 1945/05/27  [facility] CM/SW Contact:  Chapman Fitch, RN Phone Number: 09/08/2020, 2:17 PM   Clinical Narrative:     PT recommend SNF Discharge discission discussed with patient and wife.  They are in agreement that they are not interested in SNF, and prefer for him to return home with home health services. They state they do not have a preference of home health agency.  Referral made to [facility].   Patient states he does not have a RW at home and will need one at discharge Plan to start weaning TPN tomorrow     Barriers to Discharge: Continued Medical Work up   Patient Goals and CMS Choice        Discharge Placement                       Discharge Plan and Services   Discharge Planning Services: CM Consult                                 Social Determinants of Health (SDOH) Interventions     Readmission Risk Interventions No flowsheet data found.

## 2020-09-08 NOTE — Progress Notes (Signed)
[facility] SURGICAL PROGRESS NOTE  Hospital Day(s): 13.   Post op day(s): 7 Days Post-Op.   Interval History:  Patient seen and examined No acute events or new complaints overnight.  Patient reports he continues to feel slightly better No fever, chills, nausea, emesis His leukocytosis remains resolved; WBC 9.3K His renal function remains normal; sCr - 0.80; UO - unmeasured No electrolyte derangements  Surgical drain with 50 ccs; serosanguinous Continues on full liquid dit; tolerating some TPN at full rate Ambulating with PT; recommending SNF  Vital signs in last 24 hours: [min-max] current  Temp:  [98.4 F (36.9 C)-99.4 F (37.4 C)] 98.7 F (37.1 C) (05/16 0456) Pulse Rate:  [72-79] 72 (05/16 0456) Resp:  [16-20] 16 (05/16 0456) BP: (112-132)/(61-70) 115/67 (05/16 0456) SpO2:  [96 %-99 %] 99 % (05/16 0456) Weight:  [85 kg] 85 kg (05/16 0514)     Height: 5\' 8"  (172.7 cm) Weight: 85 kg BMI (Calculated): 28.5   Intake/Output last 2 shifts:  05/15 0701 - 05/16 0700 In: 3510.2 [P.O.:940; I.V.:1870.2; IV Piggyback:700] Out: 50 [Drains:50]   Physical Exam:  Constitutional: alert, cooperative and no distress Respiratory: breathing non-labored at rest  Cardiovascular: regular rate and sinus rhythm  Gastrointestinal:Soft, incisional soreness improved, non-distended, no rebound/guarding. Surgical drain with serous fluid Integumentary:Laparotomy incision is CDI with staples and honeycomb, no erythema   Labs:  CBC Latest Ref Rng & Units 09/08/2020 09/05/2020 09/03/2020  WBC 4.0 - 10.5 K/uL 9.3 8.6 16.1(H)  Hemoglobin 13.0 - 17.0 g/dL 10.1(L) 9.8(L) 11.4(L)  Hematocrit 39.0 - 52.0 % 29.9(L) 29.7(L) 34.2(L)  Platelets 150 - 400 K/uL 294 325 352   CMP Latest Ref Rng & Units 09/08/2020 09/07/2020 09/06/2020  Glucose 70 - 99 mg/dL 09/08/2020) 468(E) 321(Y)  BUN 8 - 23 mg/dL 20 21 20   Creatinine 0.61 - 1.24 mg/dL 248(G 5.00  Sodium 135 - 145 mmol/L 137 138 140   Potassium 3.5 - 5.1 mmol/L 4.0 4.0 3.8  Chloride 98 - 111 mmol/L 106 107 106  CO2 22 - 32 mmol/L 25 26 26   Calcium 8.9 - 10.3 mg/dL 8.0(L) 8.0(L) 8.1(L)  Total Protein 6.5 - 8.1 g/dL 6.0(L) - -  Total Bilirubin 0.3 - 1.2 mg/dL 0.3 - -  Alkaline Phos 38 - 126 U/L 43 - -  AST 15 - 41 U/L 30 - -  ALT 0 - 44 U/L 14 - -     Imaging studies: No new pertinent imaging studies   Assessment/Plan: 75 y.o. male 7 Days Post-Op s/p exploratory laparotomy and right hemicolectomyfor perforated appendicitis.   - Will advance to soft diet this morning  - Continue TPN full rate today; will evaluate toleration of diet; question begin weaning tomorrow   - Stop IV Abx (ancef + Flagyl) today   - Monitor abdominal examination - Pain control prn; antiemetics prn - Mobilization with therapies; recommending SNF    - Discharge Planning; Planning for mid-week discharge   All of the above findings and recommendations were discussed with the patient, and the medical team, and all of patient's questions were answered to his expressed satisfaction.  -- 4.88, PA-C [facility] 09/08/2020, 7:18 AM [phone] M-F: 7am - 4pm

## 2020-09-08 NOTE — Care Management Important Message (Signed)
Important Message  Patient Details  Name: William Parks MRN: [id number] Date of Birth: Oct 23, 1945   Medicare Important Message Given:  Yes     [name] 09/08/2020, 10:35 AM

## 2020-09-08 NOTE — Consult Note (Addendum)
PHARMACY - TOTAL PARENTERAL NUTRITION CONSULT NOTE   Indication: Prolonged ileus  Patient Measurements: Height: 5\' 8"  (172.7 cm) Weight: 85 kg (187 lb 6.3 oz) IBW/kg (Calculated) : 68.4 TPN AdjBW (KG): 72.8 Body mass index is 28.49 kg/m.  Assessment:  Patient is a 75 y/o M with medical history including T2DM c/b neuropathy, HTN, HLD who presented to the ED 5/3 with RLQ pain and poor oral intake x 4 days. Patient subsequently found to have perforated appendicitis with abscess. IR placed drain into peri-appendiceal phlegmon on 5/4. Despite conservative measures, patient without clinical improvement and underwent laparoscopic appendectomy with partial right colectomy on 5/9.   Pharmacy consulted to initiate TPN for prolonged ileus.   Glucose / Insulin: Hgb A1c 10.5%.   Patient currently ordered moderate SSI + Lantus 30u HS (increased 5/11) + 60u insulin in TPN (increased 5/15).   Glucose 190-259 last 24h. Required 33u of SSI yesterday.  Electrolytes: Hypophosphatemia - trending up 2.4>1.9>2.4>2.6 Renal: Scr stable < 1 Hepatic: LFTs normalized TG: 100  Intake / Output; MIVF: I/O net 1.3L 5/14 (1 unmeasured), NGT in place.   Surgical drain in place. No MIVF.  LBM 5/14  GI Imaging: 5/3 CT abdomen / pelvis: Acute appendicitis, with periappendiceal abscess in the RLQ 5/6 CT abdomen / pelvis: Increased inflammation in the RLQ associated with the perforated appendicitis and periappendiceal abscesses. Persistent complex abscess collections in the right lower quadrant despite percutaneous drain. Increased inflammatory changes involving the distal ileum and terminal ileum  GI Surgeries / Procedures:  5/4: CT guided peri-appendiceal drain placement 5/10: Laparoscopic appendectomy, partial right colectomy  Central access: PICC placement 5/10 TPN start date: 5/10  Nutritional Goals (per RD recommendation on 5/10): kCal: 2000 - 2200 / day, Protein: 110 - 120 g/day, Fluid: 2000 mL - 2200  mL/day  Goal TPN rate is 90 mL/hr (provides 118.8 g of protein and 2151 kcals per day)  Current Nutrition:  NPO  Plan:  --Continue TPN @ 90 mL/hr (goal rate) at 1800  Amino acid: 118.8 g  Dextrose: 302.4 g  Lipids: 64.8 g  Fluid: 2160 mL  --Electrolytes in TPN: Na 80mEq/L, K 83mEq/L, Ca 85mEq/L, Mg 72mEq/L, and  Phos 37mmol/L, Cl:Ac 1:1  --Add standard MVI and trace elements to TPN; completed thiamine 100 mg and folic acid 1 mg x 3 days given re-feed risk  --Continue Moderate q4h SSI + Increase Lantus to 40u HS (on 5/16).  Continue 60u of insulin in TPN. Blood glucose control improved.   -Mag 1.8.  Will order Magnesium 2 gm IV x 1  --Monitor TPN labs on Mon/Thurs, daily until stable  [name] A, PharmD Clinical Pharmacist 09/08/2020 10:23 AM

## 2020-09-08 NOTE — Progress Notes (Signed)
Occupational Therapy Treatment Patient Details Name: William Parks MRN: [id number] DOB: 1945/11/10 Today's Date: 09/08/2020    History of present illness 75 y/o M with medical history including T2DM c/b neuropathy, HTN, HLD who presented to the ED 5/3 with RLQ pain and poor oral intake x 4 days. Patient subsequently found to have perforated appendicitis with abscess. IR placed drain into peri-appendiceal phlegmon on 5/4. Despite conservative measures, patient without clinical improvement and underwent laparoscopic appendectomy with partial right colectomy on 5/9.   OT comments  William Parks continues to make slow but steady progress. His NG tube has been removed, he has had several BMs, and he is no longer NPO, instead is eating INDly, although reporting he has little appetite. Today he reported a "burning" sensation along his abdominal incision, rating it at 3/10 pain. He continues to present as slightly depressed. Today he engaged in UE and LE therex, in sitting and in standing, and stated he new felt more confident in his standing ability. He is able to don socks in sitting with Mod I. Pt reports that his daughter, a Engineer, civil (consulting), is trying to arrange a week off work to come to pt's home, allowing him to continue his rehab with [facility] and other home health services. If daughter is unable to come to [facility], however, and pt has no other support at home, then would recommend a STR stay for him to continue to build his strength, endurance, balance, and functional mobility.    Follow Up Recommendations  Home health OT;Supervision/Assistance - 24 hour    Equipment Recommendations       Recommendations for Other Services      Precautions / Restrictions Precautions Precautions: Fall Precaution Comments: IV TPN, gastric tube Restrictions Weight Bearing Restrictions: No       Mobility Bed Mobility Overal bed mobility: Needs Assistance Bed Mobility: Supine to Sit     Supine to sit: Min guard      General bed mobility comments: verbal cues to limit pulling and crunching to upright position to limit pain in abdomen.    Transfers Overall transfer level: Needs assistance Equipment used: Rolling walker (2 wheeled) Transfers: Sit to/from [employer] Sit to Stand: Min guard Stand pivot transfers: Min guard            Balance Overall balance assessment: Needs assistance Sitting-balance support: Feet supported Sitting balance-Leahy Scale: Good     Standing balance support: Bilateral upper extremity supported;During functional activity Standing balance-Leahy Scale: Good                 High Level Balance Comments: able to perform therex in sitting and standing           ADL either performed or assessed with clinical judgement   ADL Overall ADL's : Needs assistance/impaired Eating/Feeding: Modified independent                   Lower Body Dressing: Modified independent;Sitting/lateral leans Lower Body Dressing Details (indicate cue type and reason): donning socks                     Vision Patient Visual Report: No change from baseline     Perception     Praxis      Cognition Arousal/Alertness: Awake/alert Behavior During Therapy: WFL for tasks assessed/performed Overall Cognitive Status: Within Functional Limits for tasks assessed  Exercises Other Exercises Other Exercises: therex in sitting and standing, dressing, bed mobility, transfers, standing balance tolerance   Shoulder Instructions       General Comments      Pertinent Vitals/ Pain       Pain Score: 3  Pain Location: abdomen Pain Descriptors / Indicators: Burning Pain Intervention(s): Limited activity within patient's tolerance;Repositioned;Monitored during session  Home Living Family/patient expects to be discharged to:: Private residence Living Arrangements: Spouse/significant other Available  Help at Discharge: Family;Available PRN/intermittently Type of Home: House                                  Prior Functioning/Environment              Frequency  Min 2X/week        Progress Toward Goals  OT Goals(current goals can now be found in the care plan section)  Progress towards OT goals: Progressing toward goals  Acute Rehab OT Goals Patient Stated Goal: to get better and to get back to gardening OT Goal Formulation: With patient Time For Goal Achievement: 09/18/20 Potential to Achieve Goals: Good  Plan Discharge plan needs to be updated    Co-evaluation                 AM-PAC OT "6 Clicks" Daily Activity     Outcome Measure   Help from another person eating meals?: None Help from another person taking care of personal grooming?: None Help from another person toileting, which includes using toliet, bedpan, or urinal?: A Lot Help from another person bathing (including washing, rinsing, drying)?: A Little Help from another person to put on and taking off regular upper body clothing?: A Little Help from another person to put on and taking off regular lower body clothing?: A Lot 6 Click Score: 18    End of Session Equipment Utilized During Treatment: Rolling walker  OT Visit Diagnosis: Unsteadiness on feet (R26.81);Muscle weakness (generalized) (M62.81);Pain   Activity Tolerance Patient tolerated treatment well   Patient Left in chair;with call bell/phone within reach   Nurse Communication          Time: 5784-6962 OT Time Calculation (min): 31 min  Charges: OT General Charges $OT Visit: 1 Visit OT Treatments $Self Care/Home Management : 23-37 mins   Latina Craver, PhD, MS, OTR/L 09/08/20, 5:02 PM

## 2020-09-09 LAB — BASIC METABOLIC PANEL
Anion gap: 6 (ref 5–15)
BUN: 20 mg/dL (ref 8–23)
CO2: 25 mmol/L (ref 22–32)
Calcium: 7.9 mg/dL — ABNORMAL LOW (ref 8.9–10.3)
Chloride: 105 mmol/L (ref 98–111)
Creatinine, Ser: 0.8 mg/dL (ref 0.61–1.24)
GFR, Estimated: >60 mL/min (ref >60)
Glucose, Bld: 171 mg/dL — ABNORMAL HIGH (ref 70–99)
Potassium: 4.1 mmol/L (ref 3.5–5.1)
Sodium: 136 mmol/L (ref 135–145)

## 2020-09-09 LAB — GLUCOSE, CAPILLARY
Glucose-Capillary: 167 mg/dL — ABNORMAL HIGH (ref 70–99)
Glucose-Capillary: 173 mg/dL — ABNORMAL HIGH (ref 70–99)
Glucose-Capillary: 175 mg/dL — ABNORMAL HIGH (ref 70–99)
Glucose-Capillary: 210 mg/dL — ABNORMAL HIGH (ref 70–99)
Glucose-Capillary: 231 mg/dL — ABNORMAL HIGH (ref 70–99)
Glucose-Capillary: 98 mg/dL (ref 70–99)

## 2020-09-09 LAB — PHOSPHORUS: Phosphorus: 2.6 mg/dL (ref 2.5–4.6)

## 2020-09-09 LAB — MAGNESIUM: Magnesium: 1.9 mg/dL (ref 1.7–2.4)

## 2020-09-09 MED ORDER — INSULIN GLARGINE 100 UNIT/ML ~~LOC~~ SOLN
20.0000 [IU] | Freq: Every day | SUBCUTANEOUS | Status: DC
  Administered 2020-09-09: 20 [IU] via SUBCUTANEOUS
  Filled 2020-09-09 (×2): qty 0.2

## 2020-09-09 MED ORDER — TRAVASOL 10 % IV SOLN
INTRAVENOUS | Status: AC
  Filled 2020-09-09: qty 594

## 2020-09-09 MED ORDER — NEPRO/CARBSTEADY PO LIQD
237.0000 mL | Freq: Three times a day (TID) | ORAL | Status: DC
  Administered 2020-09-09 – 2020-09-11 (×4): 237 mL via ORAL

## 2020-09-09 NOTE — Progress Notes (Signed)
PT Cancellation Note  Patient Details Name: William Parks MRN: [id number] DOB: April 26, 1946   Cancelled Treatment:    Reason Eval/Treat Not Completed: Other (comment). Pt eating breakfast at this time. Repositioned for comfort and all questions answered at this time for pt, to re-attempt as able.   Olga Coaster PT, DPT 9:41 AM,09/09/20

## 2020-09-09 NOTE — Consult Note (Signed)
PHARMACY - TOTAL PARENTERAL NUTRITION CONSULT NOTE   Indication: Prolonged ileus  Patient Measurements: Height: 5\' 8"  (172.7 cm) Weight: 85 kg (187 lb 6.3 oz) IBW/kg (Calculated) : 68.4 TPN AdjBW (KG): 72.8 Body mass index is 28.49 kg/m.  Assessment:  Patient is a 75 y/o M with medical history including T2DM c/b neuropathy, HTN, HLD who presented to the ED 5/3 with RLQ pain and poor oral intake x 4 days. Patient subsequently found to have perforated appendicitis with abscess. IR placed drain into peri-appendiceal phlegmon on 5/4. Despite conservative measures, patient without clinical improvement and underwent laparoscopic appendectomy with partial right colectomy on 5/9.   Pharmacy consulted to initiate TPN for prolonged ileus.   Glucose / Insulin: Hgb A1c 10.5%.   Patient currently ordered moderate SSI + Lantus 30u HS (increased 5/11) + 60u insulin in TPN (increased 5/15).   Glucose 171 - 214 last 24h. Required 24 units of SSI/previous 24h  Electrolytes: all wnl Renal: Scr stable < 1 Hepatic: LFTs normalized TG: wnl  Intake / Output; MIVF: I/O net - 600 mL  Surgical drain in place. No MIVF.  LBM 5/15  GI Imaging: 5/3 CT abdomen / pelvis: Acute appendicitis, with periappendiceal abscess in the RLQ 5/6 CT abdomen / pelvis: Increased inflammation in the RLQ associated with the perforated appendicitis and periappendiceal abscesses. Persistent complex abscess collections in the right lower quadrant despite percutaneous drain. Increased inflammatory changes involving the distal ileum and terminal ileum  GI Surgeries / Procedures:  5/4: CT guided peri-appendiceal drain placement 5/10: Laparoscopic appendectomy, partial right colectomy  Central access: PICC placement 5/10 TPN start date: 5/10  Nutritional Goals (per RD recommendation on 5/10): kCal: 2000 - 2200 / day, Protein: 110 - 120 g/day, Fluid: 2000 mL - 2200 mL/day  Goal TPN rate is 90 mL/hr (provides 118.8 g of protein  and 2151 kcals per day)  Current Nutrition:  NPO  Plan:  Based on surgery recommendations, begin to wear TPN to 1/2 goal rate: 45 mL/hr (goal rate) at 1800  Amino acid: 59.4 g  Dextrose: 151.2 g  Lipids: 32.4 g  Calories: 1075.7 kCal  Fluid: 2160 mL  --Electrolytes in TPN: Na 83mEq/L, K 14mEq/L, Ca 28mEq/L, Mg 64mEq/L, and  Phos 68mmol/L, Cl:Ac 1:1  --Add standard MVI and trace elements to TPN; completed thiamine 100 mg and folic acid 1 mg x 3 days   --Continue Moderate q4h SSI + decrease Lantus to 20u HS. Decrease insulin in TPN to  30units. Blood glucose control improved.   --Monitor TPN labs on Mon/Thurs, daily until stable  30m, PharmD Clinical Pharmacist 09/09/2020 8:29 AM

## 2020-09-09 NOTE — Progress Notes (Signed)
[facility] SURGICAL PROGRESS NOTE  Hospital Day(s): 14.   Post op day(s): 8 Days Post-Op.   Interval History:  Patient seen and examined No acute events or new complaints overnight.  Patient reports some burning sensation at his incision, mostly with movement No fever, chills, nausea, emesis Labs are reassuring this morning  Surgical drain with 30 ccs out; serosanuginous Advanced to soft diet; tolerating well Kept on full rate TPN yesterday Working with therapies; progressing to home health; patient refusing SNF  Vital signs in last 24 hours: [min-max] current  Temp:  [98.1 F (36.7 C)-99 F (37.2 C)] 98.1 F (36.7 C) (05/17 0400) Pulse Rate:  [75-82] 76 (05/17 0400) Resp:  [16-18] 18 (05/17 0400) BP: (108-126)/(56-77) 121/70 (05/17 0400) SpO2:  [97 %-99 %] 98 % (05/17 0400)     Height: 5\' 8"  (172.7 cm) Weight: 85 kg BMI (Calculated): 28.5   Intake/Output last 2 shifts:  05/16 0701 - 05/17 0700 In: 2655.7 [I.V.:2455.7; IV Piggyback:200] Out: 530 [Urine:500; Drains:30]   Physical Exam:  Constitutional: alert, cooperative and no distress Respiratory: breathing non-labored at rest  Cardiovascular: regular rate and sinus rhythm  Gastrointestinal:Soft, incisional soreness improved, non-distended, no rebound/guarding. Surgical drain with serous fluid Integumentary:Laparotomy incision is CDI with staples and honeycomb, no erythema  Labs:  CBC Latest Ref Rng & Units 09/08/2020 09/05/2020 09/03/2020  WBC 4.0 - 10.5 K/uL 9.3 8.6 16.1(H)  Hemoglobin 13.0 - 17.0 g/dL 10.1(L) 9.8(L) 11.4(L)  Hematocrit 39.0 - 52.0 % 29.9(L) 29.7(L) 34.2(L)  Platelets 150 - 400 K/uL 294 325 352   CMP Latest Ref Rng & Units 09/09/2020 09/08/2020 09/07/2020  Glucose 70 - 99 mg/dL 09/09/2020) 295(A) 213(Y)  BUN 8 - 23 mg/dL 20 20 21   Creatinine 0.61 - 1.24 mg/dL 865(H 8.46  Sodium 135 - 145 mmol/L 136 137 138  Potassium 3.5 - 5.1 mmol/L 4.1 4.0 4.0  Chloride 98 - 111 mmol/L 105 106  107  CO2 22 - 32 mmol/L 25 25 26   Calcium 8.9 - 10.3 mg/dL 7.9(L) 8.0(L) 8.0(L)  Total Protein 6.5 - 8.1 g/dL - 6.0(L) -  Total Bilirubin 0.3 - 1.2 mg/dL - 0.3 -  Alkaline Phos 38 - 126 U/L - 43 -  AST 15 - 41 U/L - 30 -  ALT 0 - 44 U/L - 14 -     Imaging studies: No new pertinent imaging studies   Assessment/Plan: 75 y.o. male 8 Days Post-Op s/p exploratory laparotomy and right hemicolectomyfor perforated appendicitis.   - Continue soft / regular diet  - Begin to wean TPN; 1/2 rate today; hopefully discontinue this tomorrow (05/18)   - Monitor abdominal examination - Pain control prn; antiemetics prn - Mobilizationwith therapies; progressing well; now [facility]; patient refusing SNF      - Discharge Planning; Anticipate ready for DC tomorrow vs Thursday    All of the above findings and recommendations were discussed with the patient, and the medical team, and all of patient's questions were answered to her expressed satisfaction.  -- 08-18-1982, PA-C [facility] 09/09/2020, 7:19 AM [phone] M-F: 7am - 4pm

## 2020-09-09 NOTE — Progress Notes (Signed)
Mobility Specialist - Progress Note   09/09/20 1200  Mobility  Activity Refused mobility  Mobility performed by Mobility specialist    Pt just finished working with PT at time of attempt. Will attempt session another date/time.   Filiberto Pinks Mobility Specialist 09/09/20, 12:07 PM

## 2020-09-09 NOTE — Progress Notes (Signed)
Occupational Therapy Treatment Patient Details Name: William Parks MRN: [id number] DOB: 06-11-1945 Today's Date: 09/09/2020    History of present illness 75 y/o M with medical history including T2DM c/b neuropathy, HTN, HLD who presented to the ED 5/3 with RLQ pain and poor oral intake x 4 days. Patient subsequently found to have perforated appendicitis with abscess. IR placed drain into peri-appendiceal phlegmon on 5/4. Despite conservative measures, patient without clinical improvement and underwent laparoscopic appendectomy with partial right colectomy on 5/9.   OT comments  Pt seen for OT treatment on this date. Upon arrival to room pt awake/alert, seated upright in recliner. Pt *A&O x 4 reporting 5/10 pain. Pt agreeable to tx session. Pt instructed in falls prevention strategies, safe use of AE/DME ADL management, wound care techniques as they relate to ADL management including monitoring for signs of infection, and compensatory ADL management strategies for mgt of abdominal incision/pain. Pt engaged in ADL tasks/therapeutic exercises as described below (see ADL section for additional detail). He requires Min A for initial STS attempt during functional mobility, but progressions to CGA during the session. Set-up/supervision for toileting. Pt voices satisfaction with progress and endorses strong desire to "get back on my tractor". Pt states his daughter plans to come to stay and provide support once he DC's home. Pt making good progress toward goals and continues to benefit from skilled OT services to maximize return to PLOF and minimize risk of future falls, injury, caregiver burden, and readmission. Will continue to follow POC as written. Discharge recommendation remains appropriate.    Follow Up Recommendations  Home health OT;Supervision/Assistance - 24 hour    Equipment Recommendations  3 in 1 bedside commode (rolling walker)    Recommendations for Other Services      Precautions /  Restrictions Precautions Precautions: Fall Precaution Comments: midline incision Restrictions Weight Bearing Restrictions: No       Mobility Bed Mobility Overal bed mobility: Needs Assistance Bed Mobility: Rolling;Sidelying to Sit Rolling: Min guard Sidelying to sit: Min guard       General bed mobility comments: Deferred Pt seated in recliner at start/end of session.    Transfers Overall transfer level: Needs assistance Equipment used: Rolling walker (2 wheeled) Transfers: Sit to/from [employer] Sit to Stand: Min assist;Min guard Stand pivot transfers: Min guard       General transfer comment: cued for hand placement to increase safety for each transfer attempt. CGA from elevated surfaces, but standard/low surface pt needs at least minA    Balance Overall balance assessment: Needs assistance Sitting-balance support: Feet supported;Single extremity supported Sitting balance-Leahy Scale: Good Sitting balance - Comments: Single extremeity support required for weight shift during seated activity.   Standing balance support: Bilateral upper extremity supported;During functional activity Standing balance-Leahy Scale: Good Standing balance comment: Min LOB appreciated during ambulation. Pt recovers independently. CGA provided t/o session to ensure pt safety.                           ADL either performed or assessed with clinical judgement   ADL Overall ADL's : Needs assistance/impaired     Grooming: Wash/dry hands;Standing;Supervision/safety;Min guard Grooming Details (indicate cue type and reason): Pt performs hand hygine while standing at sink. Extended time required to support safety and activity tolerance.                 Toilet Transfer: Min guard;Minimal assistance;Regular Toilet;RW;Ambulation Statistician Details (indicate cue type and reason): Min  A to perform STS, Close CGA during ambulation from room recliner to commode  with use of RW. Assist for mgt of lines/leads. Toileting- Clothing Manipulation and Hygiene: Set up;Supervision/safety;Cueing for safety;Sitting/lateral lean Toileting - Clothing Manipulation Details (indicate cue type and reason): setup assist for hygine materials. Min cueing for use of hand rails to suport adapted lateral  leaning for peri-care mgt.     Functional mobility during ADLs: Minimal assistance;Rolling walker;Cueing for sequencing General ADL Comments: Education provided t/o session for falls prevention, safety, and adapted strategies for mgt of abdominal incision.     Vision Patient Visual Report: No change from baseline     Perception     Praxis      Cognition Arousal/Alertness: Awake/alert Behavior During Therapy: WFL for tasks assessed/performed Overall Cognitive Status: Within Functional Limits for tasks assessed                                 General Comments: Pleasant and appropriate t/o session.        Exercises Other Exercises Other Exercises: Pt educated on falls prevention strategies, would care/management techniques as they relate to ADLs, and compensatory ADL techniques for management of abdominal incision/pain t/o session. Other Exercises: LB ther-ex prior to functional activity (LAQ x10, ankle pumps x10 bilat); pt engages in functional mobility, toilet transfer, toileting, and standing hygine with education provided t/o session.   Shoulder Instructions       General Comments Lines/leads intact at start/end of session. Wound clean, dry, staples intact.    Pertinent Vitals/ Pain       Pain Assessment: 0-10 Pain Score: 5  Faces Pain Scale: Hurts a little bit Pain Location: abdomen Pain Descriptors / Indicators: Burning;Grimacing;Discomfort Pain Intervention(s): Limited activity within patient's tolerance;Monitored during session;Repositioned  Home Living                                          Prior  Functioning/Environment              Frequency  Min 2X/week        Progress Toward Goals  OT Goals(current goals can now be found in the care plan section)  Progress towards OT goals: Progressing toward goals  Acute Rehab OT Goals Patient Stated Goal: "to get back on my tractor" (riding lawnmower) OT Goal Formulation: With patient Time For Goal Achievement: 09/18/20 Potential to Achieve Goals: Good  Plan Discharge plan remains appropriate    Co-evaluation                 AM-PAC OT "6 Clicks" Daily Activity     Outcome Measure   Help from another person eating meals?: None Help from another person taking care of personal grooming?: A Little Help from another person toileting, which includes using toliet, bedpan, or urinal?: A Little Help from another person bathing (including washing, rinsing, drying)?: A Little Help from another person to put on and taking off regular upper body clothing?: A Little Help from another person to put on and taking off regular lower body clothing?: A Little 6 Click Score: 19    End of Session Equipment Utilized During Treatment: Rolling walker;Gait belt  OT Visit Diagnosis: Unsteadiness on feet (R26.81);Muscle weakness (generalized) (M62.81);Pain Pain - part of body:  (abdomen)   Activity Tolerance Patient tolerated treatment well  Patient Left in chair;with call bell/phone within reach;with chair alarm set   Nurse Communication Mobility status        Time: 8937-3428 OT Time Calculation (min): 54 min  Charges: OT General Charges $OT Visit: 1 Visit OT Treatments $Self Care/Home Management : 38-52 mins $Therapeutic Exercise: 8-22 mins  Rockney Ghee, M.S., OTR/L Ascom: [phone] 09/09/20, 3:58 PM

## 2020-09-09 NOTE — Progress Notes (Addendum)
Physical Therapy Treatment Patient Details Name: William Parks MRN: [id number] DOB: [birth year] Today's Date: 09/09/2020    History of Present Illness 75 y/o M with medical history including T2DM c/b neuropathy, HTN, HLD who presented to the ED 5/3 with RLQ pain and poor oral intake x 4 days. Patient subsequently found to have perforated appendicitis with abscess. IR placed drain into peri-appendiceal phlegmon on 5/4. Despite conservative measures, patient without clinical improvement and underwent laparoscopic appendectomy with partial right colectomy on 5/9.    PT Comments    Patient alert, agreeable to PT, very motivated to participate. The patient was instructed in the log rolling technique to minimize midline pain due to incision, able to perform with verbal cueing and CGA. The patient was able to sit for several minutes throughout session with good balance. Sit <> stand several times, minA from standard or low surfaces, pt needed education about hand placement and RW use each time to maximize safety. The patient was able to perform several bouts of ambulation, ~64ft with RW and CGA, BM noted and up to commode. Pt able to perform pericare with supervision/CGA. The patient reported fatigue at end of session. The patient continues to demonstrate improvement towards goals and would benefit from further skilled PT intervention. Discharge recommendation updated to CIR due to pt motivation and potential for improvement. MD and CM notified.     Follow Up Recommendations  HHPT with supervision intermittently/OOB/mobility    Equipment Recommendations  Rolling walker with 5" wheels    Recommendations for Other Services       Precautions / Restrictions Precautions Precautions: Fall Precaution Comments: IV TPN, gastric tube, midline incision Restrictions Weight Bearing Restrictions: No    Mobility  Bed Mobility Overal bed mobility: Needs Assistance Bed Mobility: Rolling;Sidelying to  Sit Rolling: Min guard Sidelying to sit: Min guard       General bed mobility comments: instructed in log roll technique    Transfers Overall transfer level: Needs assistance Equipment used: Rolling walker (2 wheeled) Transfers: Sit to/from Stand Sit to Stand: Min assist;Min guard         General transfer comment: cued for hand placement to increase safety for each transfer attempt. CGA from elevated surfaces, but standard/low surface pt needs at least minA  Ambulation/Gait Ambulation/Gait assistance: Min guard Gait Distance (Feet):  (30ft, seated rest break, additional 56ft, then 58ft) Assistive device: Rolling walker (2 wheeled)       General Gait Details: pt cued for RW use, did exhibit fatigue   Stairs             Wheelchair Mobility    Modified Rankin (Stroke Patients Only)       Balance Overall balance assessment: Needs assistance Sitting-balance support: Feet supported Sitting balance-Leahy Scale: Good       Standing balance-Leahy Scale: Good                              Cognition Arousal/Alertness: Awake/alert Behavior During Therapy: WFL for tasks assessed/performed Overall Cognitive Status: Within Functional Limits for tasks assessed                                        Exercises Other Exercises Other Exercises: Pt able to sit on standard commode and perform pericare with supervision/CGA.    General Comments  Pertinent Vitals/Pain Pain Assessment: Faces Faces Pain Scale: Hurts a little bit Pain Location: abdomen Pain Descriptors / Indicators: Burning;Grimacing Pain Intervention(s): Limited activity within patient's tolerance;Monitored during session;Repositioned    Home Living                      Prior Function            PT Goals (current goals can now be found in the care plan section) Progress towards PT goals: Progressing toward goals    Frequency    Min 2X/week       PT Plan Discharge plan needs to be updated    Co-evaluation              AM-PAC PT "6 Clicks" Mobility   Outcome Measure  Help needed turning from your back to your side while in a flat bed without using bedrails?: A Little Help needed moving from lying on your back to sitting on the side of a flat bed without using bedrails?: A Little Help needed moving to and from a bed to a chair (including a wheelchair)?: A Little Help needed standing up from a chair using your arms (e.g., wheelchair or bedside chair)?: A Little Help needed to walk in hospital room?: A Little Help needed climbing 3-5 steps with a railing? : A Lot 6 Click Score: 17    End of Session Equipment Utilized During Treatment: Gait belt Activity Tolerance: Patient tolerated treatment well Patient left: in chair;with call bell/phone within reach;with chair alarm set Nurse Communication: Mobility status PT Visit Diagnosis: Unsteadiness on feet (R26.81);Other abnormalities of gait and mobility (R26.89);Muscle weakness (generalized) (M62.81);History of falling (Z91.81);Difficulty in walking, not elsewhere classified (R26.2) Pain - part of body:  (abdomen)     Time: 1110-1203 PT Time Calculation (min) (ACUTE ONLY): 53 min  Charges:  $Therapeutic Exercise: 23-37 mins $Therapeutic Activity: 23-37 mins                     Olga Coaster PT, DPT 1:12 PM,09/09/20

## 2020-09-09 NOTE — Progress Notes (Addendum)
Nutrition Follow-up  DOCUMENTATION CODES:   Not applicable  INTERVENTION:   TPN per pharmacy- plan is for 1/2 rate tonight   Nepro Shake po TID, each supplement provides 425 kcal and 19 grams protein  NUTRITION DIAGNOSIS:   Inadequate oral intake related to nausea,inability to eat as evidenced by NPO status,per patient/family report.  GOAL:   Patient will meet greater than or equal to 90% of their needs -met with TPN  MONITOR:   PO intake,Supplement acceptance,Labs,Weight trends,Skin,I & O's  ASSESSMENT:   75 y/o male with h/o DM, HTN and HLD who is admitted with ruptured appendicitis and intra-abdominal abscess now s/p diagnostic laparoscopy with open right colectomy, resection of 27cm terminal iluem and ileocolonic anastomosis 5/9   Pt tolerating TPN well at goal rate. Refeed labs stable. Met with pt in room today. Pt reports some pain around his incision today. Pt also reports that his "stomach is rumbling" and reports having and episode of a large dark bowel movement in which he was unable to make it to the restroom in time which he is upset about. Pt reports that he has been working with PT today. Pt reports that his appetite is still not good. Pt advanced to a soft diet 5/16. Pt ate ~20% of his breakfast this morning but did not touch his lunch r/t his "stomach issues". Pt did drink 1/2 of a Nepro at lunch. RD discussed with pt the importance of adequate nutrition needed to support post op healing and to preserve lean muscle. Pt reports that he is willing to drink vanilla supplements. Pt reports that he will try and eat some dinner today. Plan is to start weaning TPN tonight and possibly discontinue tomorrow. Per chart, pt appears to be at his UBW currently.    Medications reviewed and include: lovenox, insulin, nicotine, protonix  Labs reviewed: K 4.1 wnl, P 2.6 wnl, Mg 1.9 wnl Pre-albumin 16.6(L), triglycerides 100- 5/16 Hgb 10.1(L), Hct 29.9(L) cbgs- 173, 167, 231 x 24  hrs  NUTRITION - FOCUSED PHYSICAL EXAM:  Flowsheet Row Most Recent Value  Orbital Region No depletion  Upper Arm Region Moderate depletion  Thoracic and Lumbar Region No depletion  Buccal Region No depletion  Temple Region No depletion  Clavicle Bone Region Mild depletion  Clavicle and Acromion Bone Region Mild depletion  Scapular Bone Region No depletion  Dorsal Hand Mild depletion  Patellar Region Moderate depletion  Anterior Thigh Region Moderate depletion  Posterior Calf Region Moderate depletion  Edema (RD Assessment) None  Hair Reviewed  Eyes Reviewed  Mouth Reviewed  Skin Reviewed  Nails Reviewed     Diet Order:   Diet Order            DIET SOFT Room service appropriate? Yes; Fluid consistency: Thin  Diet effective now                EDUCATION NEEDS:   No education needs have been identified at this time  Skin:  Skin Assessment: Skin Integrity Issues: Skin Integrity Issues:: Incisions Incisions: midline  Last BM:  5/17- TYPE 7  Height:   Ht Readings from Last 1 Encounters:  08/26/20 5' 8"  (1.727 m)    Weight:   Wt Readings from Last 1 Encounters:  09/08/20 85 kg    Ideal Body Weight:  70 kg  BMI:  Body mass index is 28.49 kg/m.  Estimated Nutritional Needs:   Kcal:  2000-2200 kcal/d  Protein:  110-120 g/day  Fluid:  2045m-2200mL/d  CKoleen Distance  MS, RD, LDN Please refer to [facility] for RD and/or RD on-call/weekend/after hours pager

## 2020-09-10 LAB — GLUCOSE, CAPILLARY
Glucose-Capillary: 110 mg/dL — ABNORMAL HIGH (ref 70–99)
Glucose-Capillary: 119 mg/dL — ABNORMAL HIGH (ref 70–99)
Glucose-Capillary: 163 mg/dL — ABNORMAL HIGH (ref 70–99)
Glucose-Capillary: 182 mg/dL — ABNORMAL HIGH (ref 70–99)
Glucose-Capillary: 175 mg/dL — ABNORMAL HIGH (ref 70–99)

## 2020-09-10 NOTE — Progress Notes (Signed)
   09/10/20 1045  Clinical Encounter Type  Visited With Patient  Visit Type Initial;Psychological support;Social support;Spiritual support  Referral From Chaplain  Consult/Referral To Chaplain   William Parks visited PT while doing rounds. Chaplain ministered with presence. Chaplain ended visit because PT received a phone call. Chaplain will try to follow up.

## 2020-09-10 NOTE — Progress Notes (Signed)
Mobility Specialist - Progress Note   09/10/20 1500  Mobility  Activity Ambulated in room  Level of Assistance Standby assist, set-up cues, supervision of patient - no hands on  Assistive Device Front wheel walker  Distance Ambulated (ft) 60 ft  Mobility Ambulated with assistance in room  Mobility Response Tolerated well  Mobility performed by Mobility specialist  $Mobility charge 1 Mobility    Pre-mobility: 77 HR, 95% SpO2 Post-mobility: 80 HR, 96% SpO2   Pt ambulated in room with RW. No LOB. Denied SOB on RA. Pain at incision site 2/10 with no increase or decrease during activity. Pt performed activity at supervision level this date. Cued for log rolling technique to exit bed, but pt preferred HOB fully elevated for transfer with extra time needed. Pt voices he feels stronger and steadier with ambulation this date. Pt left in recliner with alarm set.    William Parks Mobility Specialist 09/10/20, 3:19 PM

## 2020-09-10 NOTE — Plan of Care (Signed)

## 2020-09-10 NOTE — TOC Progression Note (Signed)
Transition of Care [facility]) - Progression Note    Patient Details  Name: William Parks MRN: [id number] Date of Birth: 1945/08/10  Transition of Care [facility]) CM/SW Contact  Chapman Fitch, RN Phone Number: 09/10/2020, 1:37 PM  Clinical Narrative:    Rolling walker referral given to [facility] with [employer]  To be delivered prior to admission    Expected Discharge Plan: Home w Home Health Services Barriers to Discharge: Continued Medical Work up  Expected Discharge Plan and Services Expected Discharge Plan: Home w Home Health Services   Discharge Planning Services: CM Consult   Living arrangements for the past 2 months: Single Family Home                                       Social Determinants of Health (SDOH) Interventions    Readmission Risk Interventions No flowsheet data found.

## 2020-09-10 NOTE — Progress Notes (Signed)
[facility] SURGICAL PROGRESS NOTE  Hospital Day(s): 15.   Post op day(s): 9 Days Post-Op.   Interval History:  Patient seen and examined No acute events or new complaints overnight.  Patient reports continues soreness/burning at lower portion of incisions, worse after ambulation No fever, chills, nausea, emesis No new labs or imaging Surgical drain with 40 ccs out; serous Advanced to soft diet; tolerating well Began weaningTPN yesterday Working with therapies; progressed to home health  Vital signs in last 24 hours: [min-max] current  Temp:  [97.8 F (36.6 C)-98.7 F (37.1 C)] 98.7 F (37.1 C) (05/18 0738) Pulse Rate:  [67-92] 67 (05/18 0738) Resp:  [16-18] 18 (05/18 0738) BP: (112-146)/(64-89) 122/69 (05/18 0738) SpO2:  [97 %-100 %] 98 % (05/18 0738)     Height: 5\' 8"  (172.7 cm) Weight: 85 kg BMI (Calculated): 28.5   Intake/Output last 2 shifts:  05/17 0701 - 05/18 0700 In: 1610.1 [P.O.:120; I.V.:1490.1] Out: 1241 [Urine:1200; Drains:40; Stool:1]   Physical Exam:  Constitutional: alert, cooperative and no distress Respiratory: breathing non-labored at rest  Cardiovascular: regular rate and sinus rhythm  Gastrointestinal:Soft, incisional sorenessimproved, non-distended, no rebound/guarding. Surgical drain with serous fluid Integumentary:Laparotomy incision is CDI with staples and honeycomb, no erythema  Labs:  CBC Latest Ref Rng & Units 09/08/2020 09/05/2020 09/03/2020  WBC 4.0 - 10.5 K/uL 9.3 8.6 16.1(H)  Hemoglobin 13.0 - 17.0 g/dL 10.1(L) 9.8(L) 11.4(L)  Hematocrit 39.0 - 52.0 % 29.9(L) 29.7(L) 34.2(L)  Platelets 150 - 400 K/uL 294 325 352   CMP Latest Ref Rng & Units 09/09/2020 09/08/2020 09/07/2020  Glucose 70 - 99 mg/dL 09/09/2020) 161(W) 960(A)  BUN 8 - 23 mg/dL 20 20 21   Creatinine 0.61 - 1.24 mg/dL 540(J 8.11  Sodium 135 - 145 mmol/L 136 137 138  Potassium 3.5 - 5.1 mmol/L 4.1 4.0 4.0  Chloride 98 - 111 mmol/L 105 106 107  CO2 22 - 32  mmol/L 25 25 26   Calcium 8.9 - 10.3 mg/dL 7.9(L) 8.0(L) 8.0(L)  Total Protein 6.5 - 8.1 g/dL - 6.0(L) -  Total Bilirubin 0.3 - 1.2 mg/dL - 0.3 -  Alkaline Phos 38 - 126 U/L - 43 -  AST 15 - 41 U/L - 30 -  ALT 0 - 44 U/L - 14 -     Imaging studies: No new pertinent imaging studies   Assessment/Plan:  75 y.o. male 9 Days Post-Op s/p exploratory laparotomy and right hemicolectomyfor perforated appendicitis.   - Continue soft / regular diet             - Discontinue TPN today  - Will plan on drain removal tomorrow (05/19)  - Will plan on staple removal tomorrow (05/19)             - Monitor abdominal examination - Pain control prn; antiemetics prn - Mobilizationwith therapies; progressing well; now [facility]                           - Discharge Planning; Anticipate discharge home with home health tomorrow (05/19)  All of the above findings and recommendations were discussed with the patient, and the medical team, and all of patient's questions were answered to his expressed satisfaction.  -- 08-24-1976, PA-C [facility] 09/10/2020, 8:55 AM [phone] M-F: 7am - 4pm

## 2020-09-11 ENCOUNTER — Other Ambulatory Visit: Payer: Self-pay | Admitting: Family Medicine

## 2020-09-11 DIAGNOSIS — M545 Low back pain, unspecified: Secondary | ICD-10-CM

## 2020-09-11 LAB — GLUCOSE, CAPILLARY
Glucose-Capillary: 118 mg/dL — ABNORMAL HIGH (ref 70–99)
Glucose-Capillary: 137 mg/dL — ABNORMAL HIGH (ref 70–99)
Glucose-Capillary: 149 mg/dL — ABNORMAL HIGH (ref 70–99)
Glucose-Capillary: 196 mg/dL — ABNORMAL HIGH (ref 70–99)

## 2020-09-11 MED ORDER — OXYCODONE HCL 5 MG PO TABS: 5.0000 mg | ORAL_TABLET | Freq: Four times a day (QID) | ORAL | 0 refills | Status: DC | PRN

## 2020-09-11 MED ORDER — IBUPROFEN 600 MG PO TABS
600.0000 mg | ORAL_TABLET | Freq: Four times a day (QID) | ORAL | 0 refills | Status: DC | PRN
Start: 2020-09-11 — End: 2021-05-25

## 2020-09-11 NOTE — Discharge Summary (Signed)
[facility] Parks SURGICAL DISCHARGE SUMMARY  Patient ID: William Parks MRN: [id number] DOB/AGE: 11/26/1945 75 y.o.  Admit date: 08/26/2020 Discharge date: 09/11/2020  Discharge Diagnoses Patient Active Problem List   Diagnosis Date Noted  . Ruptured appendicitis 08/26/2020    Consultants Interventional Radiology Medicine  Procedures 09/01/2020:  1.  Diagnostic laparoscopy 2.  Open Right Colectomy with ileocolonic anastomosis  HPI: William Parks is a 75 y.o. male presenting to the emergency room with a 5-day history of right lower quadrant abdominal pain.  The patient reports that his pain has been progressively getting worse.  Initially was vague located in the mid abdomen and then he feels like it moved slightly towards the left side but now more focused on the right lower quadrant.  He reports having cold chills the last couple of nights but denies any fevers.  Denies any chest pain, shortness of breath.  He does endorse nausea but no emesis.  Also reports decreased appetite over the last few days.  Denies any diarrhea or constipation but he does report having issues recently with urinary incontinence he does wear adult diapers.  He reports that he has not told his family about this.  In the emergency room, his laboratory work-up showed a white blood cell count of 12.5 with a glucose that is very elevated at 376 with normal LFTs but low albumin to 3.3.  Creatinine is normal at 1.14.  He did have a CT scan of the abdomen pelvis which showed a dilated appendix measuring up to 17 mm with a periappendiceal abscess in the right lower quadrant measuring 5 x 5.2 x 5 cm.  Hospital Course: Patient was initially admitted to the general surgery service and conservative measures were initiated. Patient underwent percutaneous drain placement on 05/04 with interventional radiology. Unfortunately, he failed to progress. Ultimately the decision was made to procedure with surgery. On 05/09, he  underwent diagnostic laparoscopy which was converted to open right hemicolectomy with Dr Piscoya. His post-operative course was complicated by a prolonged ileus requiring NGT and TPN. He additionally worked with therapies throughout the admission and progressed to HHPT. He would ultimately regain bowel function on POD5. NGT was removed and TPN was weaned as diet advanced. The remainder of patient's hospital course was essentially unremarkable, and discharge planning was initiated accordingly with patient safely able to be discharged home with appropriate discharge instructions, pain control, and outpatient follow-up after all of his questions were answered to his expressed satisfaction.   Discharge Condition: Good   Physical Examination:  Constitutional: alert, cooperative and no distress Respiratory: breathing non-labored at rest  Cardiovascular: regular rate and sinus rhythm  Gastrointestinal:Soft, incisional sorenessimproved, non-distended, no rebound/guarding. Surgical drain with serous fluid (will remove at DC) Integumentary:Laparotomy incision is CDI with staples (will remove at DC), no erythema   Allergies as of 09/11/2020   No Known Allergies     Medication List    TAKE these medications   aspirin 81 MG EC tablet Take 81 mg by mouth daily.   atorvastatin 10 MG tablet Commonly known as: LIPITOR Take 1 tablet (10 mg total) by mouth daily at 6 PM.   blood glucose meter kit and supplies Dispense based on patient and insurance preference. Use up to four times daily as directed. (FOR ICD-10 E10.9, E11.9).   docusate sodium 100 MG capsule Commonly known as: COLACE Take 100 mg by mouth daily.   ibuprofen 600 MG tablet Commonly known as: ADVIL Take 1 tablet (600 mg total) by mouth   every 6 (six) hours as needed.   Insulin Lispro Prot & Lispro (75-25) 100 UNIT/ML Kwikpen Commonly known as: HumaLOG Mix 75/25 KwikPen INJECT INTO THE SKIN 2 (TWO) TIMES DAILY WITH A MEAL. 30  UNITS IN AM AND 25 UNITS BEFORE DINNER   losartan 100 MG tablet Commonly known as: COZAAR Take 1 tablet (100 mg total) by mouth daily.   metoprolol tartrate 25 MG tablet Commonly known as: LOPRESSOR Take 1 tablet (25 mg total) by mouth 2 (two) times daily.   NovoFine 32G X 6 MM Misc Generic drug: Insulin Pen Needle USE AS DIRECTED   Insulin Pen Needle 32G X 6 MM Misc SMARTSIG:SUB-Q As Directed   OneTouch Delica Plus [id number] Misc TEST UP TO 4 TIMES A DAY   OneTouch Ultra test strip Generic drug: glucose blood TEST UP TO 4 TIMES A DAY   oxyCODONE 5 MG immediate release tablet Commonly known as: Oxy IR/ROXICODONE Take 1 tablet (5 mg total) by mouth every 6 (six) hours as needed for severe pain or breakthrough pain.   pregabalin 150 MG capsule Commonly known as: LYRICA Take 1 capsule (150 mg total) by mouth 2 (two) times daily. What changed: when to take this   tiZANidine 2 MG tablet Commonly known as: ZANAFLEX TAKE 1 TABLET BY MOUTH EVERY EVENING. What changed: additional instructions            Durable Medical Equipment  (From admission, onward)         Start     Ordered   09/10/20 1335  For home use only DME Walker rolling  Once       Question Answer Comment  Walker: With [place] Wheels   Patient needs a walker to treat with the following condition Weakness      09/10/20 1334            Follow-up Information    Hollice Espy, MD. Schedule an appointment as soon as possible for a visit in 2 week(s).   Specialty: Urology Why: 2-3 week follow up; urinary incontinence  Contact information: [place] [id number] [phone]        Olean Ree, MD. Schedule an appointment as soon as possible for a visit in 2 week(s).   Specialty: General Surgery Why: s/p right hemicolectomy  Contact information: [address] [place] [address] [phone]                Time spent on  discharge management including discussion of hospital course, clinical condition, outpatient instructions, prescriptions, and follow up with the patient and members of the medical team: >30 minutes  -- Edison Simon , PA-C [facility]  09/11/2020, 8:24 AM [phone] M-F: 7am - 4pm

## 2020-09-11 NOTE — Progress Notes (Signed)
Patient discharged to home via wheelchair accompanied by family member.  All pertinenent information, prescriptions, and personal belongings given to patient.  Patient able to verbalize understanding and teach back discharge instructions.  PICC line d/ced, pressure dressing applied.  JP drain d/ced per md order with dressing.  Staples to abdomen taken out.  No acute distress noted.  Care relinquished.

## 2020-09-11 NOTE — TOC Transition Note (Addendum)
Transition of Care [facility]) - CM/SW Discharge Note   Patient Details  Name: William Parks MRN: [id number] Date of Birth: 1946/01/29  Transition of Care [facility]) CM/SW Contact:  Chapman Fitch, RN Phone Number: 09/11/2020, 9:27 AM   Clinical Narrative:     Patient to discharge today Wife and son to transport RW delivered to room [place] with [facility] notified of discharge PA confirmed no wound care at discharge   Earliest start of care for home health Monday 5/23 - Zach in agreement.  Patient updated   Final next level of care: Home w Home Health Services Barriers to Discharge: No Barriers Identified   Patient Goals and CMS Choice        Discharge Placement                       Discharge Plan and Services   Discharge Planning Services: CM Consult                      HH Arranged: PT,OT HH Agency: [facility] Date HH Agency Contacted: 09/11/20   Representative spoke with at [facility] Agency: Barbara Cower  Social Determinants of Health (SDOH) Interventions     Readmission Risk Interventions No flowsheet data found.

## 2020-09-11 NOTE — Discharge Instructions (Signed)
In addition to included general post-operative instructions,  Diet: Resume home diet.   Activity: No heavy lifting >20 pounds (children, pets, laundry, garbage) for 6 weeks, but light activity and walking are encouraged. Do not drive or drink alcohol if taking narcotic pain medications or having pain that might distract from driving.  Wound care: You may shower/get incision wet with soapy water and pat dry (do not rub incisions), but no baths or submerging incision underwater until follow-up.   Medications: Resume all home medications. For mild to moderate pain: acetaminophen (Tylenol) or ibuprofen/naproxen (if no kidney disease). Combining Tylenol with alcohol can substantially increase your risk of causing liver disease. Narcotic pain medications, if prescribed, can be used for severe pain, though may cause nausea, constipation, and drowsiness. Do not combine Tylenol and Percocet (or similar) within a 6 hour period as Percocet (and similar) contain(s) Tylenol. If you do not need the narcotic pain medication, you do not need to fill the prescription.  Call office ([phone] / [phone]) at any time if any questions, worsening pain, fevers/chills, bleeding, drainage from incision site, or other concerns.  

## 2020-09-11 NOTE — Progress Notes (Signed)
Discharge instructions given to patient. Verbalized understanding. No acute distress at this time.

## 2020-09-11 NOTE — Care Management Important Message (Signed)
Important Message  Patient Details  Name: William Parks MRN: [id number] Date of Birth: [birth year]   Medicare Important Message Given:  Yes     [name] 09/11/2020, 1:44 PM

## 2020-09-12 ENCOUNTER — Emergency Department
Admission: EM | Admit: 2020-09-12 | Discharge: 2020-09-12 | Disposition: A | Discharge status: Home / Self Care | Payer: Medicare Other | Attending: Emergency Medicine | Admitting: Emergency Medicine

## 2020-09-12 ENCOUNTER — Other Ambulatory Visit: Payer: Self-pay

## 2020-09-12 DIAGNOSIS — R1031 Right lower quadrant pain: Secondary | ICD-10-CM | POA: Diagnosis not present

## 2020-09-12 DIAGNOSIS — I10 Essential (primary) hypertension: Secondary | ICD-10-CM | POA: Diagnosis not present

## 2020-09-12 DIAGNOSIS — T8131XA Disruption of external operation (surgical) wound, not elsewhere classified, initial encounter: Secondary | ICD-10-CM | POA: Insufficient documentation

## 2020-09-12 DIAGNOSIS — E1140 Type 2 diabetes mellitus with diabetic neuropathy, unspecified: Secondary | ICD-10-CM | POA: Insufficient documentation

## 2020-09-12 DIAGNOSIS — Z79899 Other long term (current) drug therapy: Secondary | ICD-10-CM | POA: Insufficient documentation

## 2020-09-12 DIAGNOSIS — E1165 Type 2 diabetes mellitus with hyperglycemia: Secondary | ICD-10-CM | POA: Diagnosis not present

## 2020-09-12 DIAGNOSIS — Z7982 Long term (current) use of aspirin: Secondary | ICD-10-CM | POA: Insufficient documentation

## 2020-09-12 DIAGNOSIS — Z9049 Acquired absence of other specified parts of digestive tract: Secondary | ICD-10-CM | POA: Diagnosis not present

## 2020-09-12 DIAGNOSIS — Z794 Long term (current) use of insulin: Secondary | ICD-10-CM | POA: Diagnosis not present

## 2020-09-12 DIAGNOSIS — Z743 Need for continuous supervision: Secondary | ICD-10-CM | POA: Diagnosis not present

## 2020-09-12 DIAGNOSIS — F17210 Nicotine dependence, cigarettes, uncomplicated: Secondary | ICD-10-CM | POA: Diagnosis not present

## 2020-09-12 DIAGNOSIS — K9189 Other postprocedural complications and disorders of digestive system: Secondary | ICD-10-CM | POA: Diagnosis not present

## 2020-09-12 DIAGNOSIS — R109 Unspecified abdominal pain: Secondary | ICD-10-CM | POA: Diagnosis not present

## 2020-09-12 DIAGNOSIS — T8130XA Disruption of wound, unspecified, initial encounter: Secondary | ICD-10-CM

## 2020-09-12 DIAGNOSIS — M19011 Primary osteoarthritis, right shoulder: Secondary | ICD-10-CM | POA: Diagnosis not present

## 2020-09-12 DIAGNOSIS — Z8719 Personal history of other diseases of the digestive system: Secondary | ICD-10-CM | POA: Diagnosis not present

## 2020-09-12 DIAGNOSIS — E785 Hyperlipidemia, unspecified: Secondary | ICD-10-CM | POA: Diagnosis not present

## 2020-09-12 DIAGNOSIS — G8918 Other acute postprocedural pain: Secondary | ICD-10-CM | POA: Diagnosis not present

## 2020-09-12 DIAGNOSIS — Z9181 History of falling: Secondary | ICD-10-CM | POA: Diagnosis not present

## 2020-09-12 DIAGNOSIS — Z48815 Encounter for surgical aftercare following surgery on the digestive system: Secondary | ICD-10-CM | POA: Diagnosis not present

## 2020-09-12 DIAGNOSIS — E1169 Type 2 diabetes mellitus with other specified complication: Secondary | ICD-10-CM | POA: Diagnosis not present

## 2020-09-12 DIAGNOSIS — R6889 Other general symptoms and signs: Secondary | ICD-10-CM | POA: Diagnosis not present

## 2020-09-12 MED ORDER — OXYCODONE HCL 5 MG PO TABS
5.0000 mg | ORAL_TABLET | Freq: Once | ORAL | Status: AC
  Administered 2020-09-12: 5 mg via ORAL
  Filled 2020-09-12: qty 1

## 2020-09-12 NOTE — ED Notes (Signed)
Patients wife updated as to patient's status.

## 2020-09-12 NOTE — ED Triage Notes (Signed)
Pt BIBA for post op problem. Pt had his appendix removed 16 days ago. When PT came and noticed a 1cm x 1cm opening at post op site on abdomen. Pt reports having his staples removed yesterday. Pt c/o pain at incision.

## 2020-09-12 NOTE — ED Provider Notes (Signed)
[facility] Emergency Department Provider Note ____________________________________________   Event Date/Time   First MD Initiated Contact with Patient 09/12/20 1717     (approximate)  I have reviewed the triage vital signs and the nursing notes.   HISTORY  Chief Complaint Post-op Problem  HPI William Parks is a 75 y.o. male with history of ruptured appendicitis, diabetes, hypertension and other history as listed below presents to the emergency department for treatment and evaluation of surgical wound.  He had an open appendectomy approximately 17 days ago.  Staples were removed yesterday.  Today, drainage was noted from the surgical site.  He has 2 open areas of concern.  He is also having "spasms" over the right lower quadrant.  He denies fever, vomiting, or other symptoms of concern.         Past Medical History:  Diagnosis Date  . Arthritis    rt shoulder  . Diabetes mellitus without complication ([place])   . Hyperlipidemia   . Hyperlipidemia associated with type 2 diabetes mellitus ([place]) 06/02/2015  . Hypertension     Patient Active Problem List   Diagnosis Date Noted  . Ruptured appendicitis 08/26/2020  . DM2 (diabetes mellitus, type 2) ([place]) 08/26/2020  . DM (diabetes mellitus), type 2, uncontrolled ([place]) 08/26/2020  . Acute appendicitis with localized peritonitis and abscess   . Atherosclerosis of aorta ([place]) 10/03/2019  . Neck and shoulder pain 11/08/2016  . Tobacco abuse counseling 10/25/2016  . Vitamin D deficiency 10/15/2015  . Dyslipidemia 06/02/2015  . Hypertension 04/01/2015  . Uncontrolled type 2 diabetes mellitus with peripheral neuropathy ([place]) 04/01/2015    Past Surgical History:  Procedure Laterality Date  . CYST REMOVAL TRUNK     2x  . LAPAROSCOPY N/A 09/01/2020   Procedure: LAPAROSCOPY DIAGNOSTIC;  Surgeon: Olean Ree, MD;  Location: [facility] ORS;  Service: General;  Laterality: N/A;  . PARTIAL COLECTOMY N/A 09/01/2020    Procedure: PARTIAL RIGHT COLECTOMY;  Surgeon: Olean Ree, MD;  Location: [facility] ORS;  Service: General;  Laterality: N/A;  . SHOULDER SURGERY Right 2009   rotator cuff surgery    Prior to Admission medications   Medication Sig Start Date End Date Taking? Authorizing Provider  aspirin 81 MG EC tablet Take 81 mg by mouth daily. 01/25/15   [provider]  atorvastatin (LIPITOR) 10 MG tablet Take 1 tablet (10 mg total) by mouth daily at 6 PM. 07/16/20   Steele Sizer, MD  blood glucose meter kit and supplies Dispense based on patient and insurance preference. Use up to four times daily as directed. (FOR ICD-10 E10.9, E11.9). 10/18/18   Steele Sizer, MD  docusate sodium (COLACE) 100 MG capsule Take 100 mg by mouth daily.    [provider]  ibuprofen (ADVIL) 600 MG tablet Take 1 tablet (600 mg total) by mouth every 6 (six) hours as needed. 09/11/20   Tylene Fantasia, PA-C  Insulin Lispro Prot & Lispro (HUMALOG MIX 75/25 KWIKPEN) (75-25) 100 UNIT/ML Kwikpen INJECT INTO THE SKIN 2 (TWO) TIMES DAILY WITH A MEAL. 30 UNITS IN AM AND 25 UNITS BEFORE DINNER 03/05/20   Steele Sizer, MD  Insulin Pen Needle 32G X 6 MM MISC SMARTSIG:SUB-Q As Directed 12/11/19   [provider]  Lancets (ONETOUCH DELICA PLUS [id number]) [place] TEST UP TO 4 TIMES A DAY 10/18/18   [provider]  losartan (COZAAR) 100 MG tablet Take 1 tablet (100 mg total) by mouth daily. 07/16/20   Steele Sizer, MD  metoprolol  tartrate (LOPRESSOR) 25 MG tablet Take 1 tablet (25 mg total) by mouth 2 (two) times daily. 07/16/20   Steele Sizer, MD  NOVOFINE 32G X 6 MM MISC USE AS DIRECTED 05/15/19   Steele Sizer, MD  [facility] ULTRA test strip TEST UP TO 4 TIMES A DAY 11/26/18   Steele Sizer, MD  oxyCODONE (OXY IR/ROXICODONE) 5 MG immediate release tablet Take 1 tablet (5 mg total) by mouth every 6 (six) hours as needed for severe pain or breakthrough pain. 09/11/20   Tylene Fantasia, PA-C  pregabalin  (LYRICA) 150 MG capsule Take 1 capsule (150 mg total) by mouth 2 (two) times daily. Patient taking differently: Take 150 mg by mouth at bedtime. 07/16/20   Steele Sizer, MD  tiZANidine (ZANAFLEX) 2 MG tablet TAKE 1 TABLET BY MOUTH EVERY EVENING. 09/11/20   Steele Sizer, MD    Allergies Patient has no known allergies.  Family History  Problem Relation Age of Onset  . Cancer Mother        Breast CA  . Diabetes Mother   . Hypertension Mother   . Diabetes Brother     Social History Social History   Tobacco Use  . Smoking status: Light Tobacco Smoker    Packs/day: 0.25    Years: 50.00    Pack years: 12.50    Types: Cigarettes    Start date: 07/27/1967  . Smokeless tobacco: Never Used  . Tobacco comment: 2-3 cigarettes per day, smoking cessation program information provided  Vaping Use  . Vaping Use: Never used  Substance Use Topics  . Alcohol use: No    Alcohol/week: 0.0 standard drinks  . Drug use: No    Review of Systems  Constitutional: No fever/chills Eyes: No visual changes. ENT: No sore throat. Cardiovascular: Denies chest pain. Respiratory: Denies shortness of breath. Gastrointestinal: Positive for abdominal pain. no nausea, no vomiting.  No diarrhea.  No constipation. Genitourinary: Negative for dysuria. Musculoskeletal: Negative for back pain. Skin: Negative for rash. Neurological: Negative for headaches, focal weakness or numbness. ____________________________________________   PHYSICAL EXAM:  VITAL SIGNS: ED Triage Vitals  Enc Vitals Group     BP 09/12/20 1615 129/76     Pulse Rate 09/12/20 1615 88     Resp 09/12/20 1615 17     Temp 09/12/20 1615 (!) 97.5 F (36.4 C)     Temp Source 09/12/20 1615 Oral     SpO2 09/12/20 1615 97 %     Weight 09/12/20 1613 180 lb (81.6 kg)     Height 09/12/20 1613 5' 8"  (1.727 m)     Head Circumference --      Peak Flow --      Pain Score --      Pain Loc --      Pain Edu? --      Excl. in [place]? --      Constitutional: Alert and oriented. Well appearing and in no acute distress. Eyes: Conjunctivae are normal.  Head: Atraumatic. Nose: No congestion/rhinnorhea. Mouth/Throat: Mucous membranes are moist.  Oropharynx non-erythematous. Neck: No stridor.   Hematological/Lymphatic/Immunilogical: No cervical lymphadenopathy. Cardiovascular: Normal rate, regular rhythm. Grossly normal heart sounds. Good peripheral circulation. Respiratory: Normal respiratory effort.  No retractions. Lungs CTAB. Gastrointestinal: Soft and appropriately tender post surgery. No distention.  Genitourinary:  Musculoskeletal: No lower extremity tenderness nor edema.  No joint effusions. Neurologic:  Normal speech and language. No gross focal neurologic deficits are appreciated. No gait instability. Skin: Vertical incision over the right abdomen  with a 1cm wound dehiscence and serous sanguinous drainage.  Granulation tissue is noted.  Less than 1 cm very superficial wound dehiscence at the distal end of the incision.  No obvious infection.  No cellulitis surrounding incision site. Psychiatric: Mood and affect are normal. Speech and behavior are normal.  ____________________________________________   LABS (all labs ordered are listed, but only abnormal results are displayed)  Labs Reviewed - No data to display ____________________________________________  EKG   ____________________________________________  RADIOLOGY  ED MD interpretation:     I, Sherrie George, personally viewed and evaluated these images (plain radiographs) as part of my medical decision making, as well as reviewing the written report by the radiologist.  Official radiology report(s): No results found.  ____________________________________________   PROCEDURES  Procedure(s) performed (including Critical Care):  Procedures  ____________________________________________   INITIAL IMPRESSION / ASSESSMENT AND PLAN      75 year old male presenting to the emergency department for treatment and evaluation of surgical site.  See HPI for further details.  Exam does reveal 2 small areas of wound dehiscence without evidence of infection or surrounding cellulitis.  He is afebrile, not tachycardic or tachypneic.  Plan will be to apply wet-to-dry dressing over the proximal dehiscence and an antibiotic ointment dressing to the lower opening.  He will be discharged home with wound care instructions and encouraged to follow-up with his surgeon as scheduled.     ___________________________________________   FINAL CLINICAL IMPRESSION(S) / ED DIAGNOSES  Final diagnoses:  Wound dehiscence  Post-op pain     ED Discharge Orders    None       William Parks was evaluated in Emergency Department on 09/12/2020 for the symptoms described in the history of present illness. He was evaluated in the context of the global COVID-19 pandemic, which necessitated consideration that the patient might be at risk for infection with the SARS-CoV-2 virus that causes COVID-19. Institutional protocols and algorithms that pertain to the evaluation of patients at risk for COVID-19 are in a state of rapid change based on information released by regulatory bodies including the CDC and federal and state organizations. These policies and algorithms were followed during the patient's care in the ED.   Note:  This document was prepared using Dragon voice recognition software and may include unintentional dictation errors.   Victorino Dike, FNP 09/12/20 1805    Nance Pear, MD 09/12/20 [phone]

## 2020-09-12 NOTE — ED Notes (Signed)
Applied wet to dry dressing to 1 cm opening at incision site.

## 2020-09-12 NOTE — Discharge Instructions (Signed)
Please change the dressing daily.  Follow up with your surgeon for concern of infection or if the wound opens any further.

## 2020-09-15 ENCOUNTER — Telehealth: Payer: Self-pay

## 2020-09-15 NOTE — Telephone Encounter (Signed)
Scheduled appt with Gabriel Cirri for 5.31.2022.  Copied from CRM [id number]. Topic: General - Other >> Sep 15, 2020 12:47 PM Jaquita Rector A wrote: Reason for CRM: A rep with [facility] patients insurance called in to inform Dr Carlynn Purl that patient was just released from the hospital and need an appointment with Dr Carlynn Purl earlier than July. Please advise

## 2020-09-16 ENCOUNTER — Telehealth: Payer: Self-pay | Admitting: Family Medicine

## 2020-09-16 DIAGNOSIS — E1169 Type 2 diabetes mellitus with other specified complication: Secondary | ICD-10-CM | POA: Diagnosis not present

## 2020-09-16 DIAGNOSIS — Z48815 Encounter for surgical aftercare following surgery on the digestive system: Secondary | ICD-10-CM | POA: Diagnosis not present

## 2020-09-16 DIAGNOSIS — E785 Hyperlipidemia, unspecified: Secondary | ICD-10-CM | POA: Diagnosis not present

## 2020-09-16 DIAGNOSIS — F17210 Nicotine dependence, cigarettes, uncomplicated: Secondary | ICD-10-CM | POA: Diagnosis not present

## 2020-09-16 DIAGNOSIS — Z8719 Personal history of other diseases of the digestive system: Secondary | ICD-10-CM | POA: Diagnosis not present

## 2020-09-16 DIAGNOSIS — E1165 Type 2 diabetes mellitus with hyperglycemia: Secondary | ICD-10-CM | POA: Diagnosis not present

## 2020-09-16 DIAGNOSIS — E1140 Type 2 diabetes mellitus with diabetic neuropathy, unspecified: Secondary | ICD-10-CM | POA: Diagnosis not present

## 2020-09-16 DIAGNOSIS — K9189 Other postprocedural complications and disorders of digestive system: Secondary | ICD-10-CM | POA: Diagnosis not present

## 2020-09-16 DIAGNOSIS — Z9181 History of falling: Secondary | ICD-10-CM | POA: Diagnosis not present

## 2020-09-16 DIAGNOSIS — M19011 Primary osteoarthritis, right shoulder: Secondary | ICD-10-CM | POA: Diagnosis not present

## 2020-09-16 DIAGNOSIS — Z9049 Acquired absence of other specified parts of digestive tract: Secondary | ICD-10-CM | POA: Diagnosis not present

## 2020-09-16 DIAGNOSIS — I10 Essential (primary) hypertension: Secondary | ICD-10-CM | POA: Diagnosis not present

## 2020-09-16 NOTE — Telephone Encounter (Unsigned)
Copied from CRM [id number]. Topic: Quick Communication - Home Health Verbal Orders >> Sep 16, 2020  2:53 PM Marylen Ponto wrote: Caller/Agency: Nicholaus Corolla with [facility] Callback Number: [phone] Requesting OT/PT/Skilled Nursing/Social Work/Speech Therapy: OT Frequency: 1 time for 1 week and 2 times for 3 weeks

## 2020-09-18 ENCOUNTER — Telehealth: Payer: Self-pay | Admitting: Surgery

## 2020-09-18 NOTE — Telephone Encounter (Signed)
Please call Tiffany with [facility] at [phone] option 2.  Patient had colectomy done with Dr. Aleen Campi on 09/01/20.  She needs orders for re-evaluation 1 x for 2 weeks.  Please call her.  Thank you.

## 2020-09-18 NOTE — Telephone Encounter (Signed)
Called and spoke with Tiffany and verbal consent given for new orders.

## 2020-09-19 DIAGNOSIS — I10 Essential (primary) hypertension: Secondary | ICD-10-CM | POA: Diagnosis not present

## 2020-09-19 DIAGNOSIS — M19011 Primary osteoarthritis, right shoulder: Secondary | ICD-10-CM | POA: Diagnosis not present

## 2020-09-19 DIAGNOSIS — E1165 Type 2 diabetes mellitus with hyperglycemia: Secondary | ICD-10-CM | POA: Diagnosis not present

## 2020-09-19 DIAGNOSIS — K9189 Other postprocedural complications and disorders of digestive system: Secondary | ICD-10-CM | POA: Diagnosis not present

## 2020-09-19 DIAGNOSIS — E1169 Type 2 diabetes mellitus with other specified complication: Secondary | ICD-10-CM | POA: Diagnosis not present

## 2020-09-19 DIAGNOSIS — E785 Hyperlipidemia, unspecified: Secondary | ICD-10-CM | POA: Diagnosis not present

## 2020-09-19 DIAGNOSIS — Z48815 Encounter for surgical aftercare following surgery on the digestive system: Secondary | ICD-10-CM | POA: Diagnosis not present

## 2020-09-19 DIAGNOSIS — Z9049 Acquired absence of other specified parts of digestive tract: Secondary | ICD-10-CM | POA: Diagnosis not present

## 2020-09-19 DIAGNOSIS — Z9181 History of falling: Secondary | ICD-10-CM | POA: Diagnosis not present

## 2020-09-19 DIAGNOSIS — E1140 Type 2 diabetes mellitus with diabetic neuropathy, unspecified: Secondary | ICD-10-CM | POA: Diagnosis not present

## 2020-09-19 DIAGNOSIS — F17210 Nicotine dependence, cigarettes, uncomplicated: Secondary | ICD-10-CM | POA: Diagnosis not present

## 2020-09-19 DIAGNOSIS — Z8719 Personal history of other diseases of the digestive system: Secondary | ICD-10-CM | POA: Diagnosis not present

## 2020-09-21 ENCOUNTER — Other Ambulatory Visit: Payer: Self-pay | Admitting: Family Medicine

## 2020-09-21 DIAGNOSIS — E1165 Type 2 diabetes mellitus with hyperglycemia: Secondary | ICD-10-CM

## 2020-09-21 DIAGNOSIS — I700 Atherosclerosis of aorta: Secondary | ICD-10-CM

## 2020-09-21 DIAGNOSIS — E1169 Type 2 diabetes mellitus with other specified complication: Secondary | ICD-10-CM

## 2020-09-21 NOTE — Telephone Encounter (Signed)
Requested Prescriptions  Pending Prescriptions Disp Refills  . Insulin Lispro Prot & Lispro (HUMALOG MIX 75/25 KWIKPEN) (75-25) 100 UNIT/ML [employer] Med Name: HUMALOG MIX 75-25 KWIKPEN] 15 mL 0    Sig: INJECT INTO THE SKIN 2 (TWO) TIMES DAILY WITH A MEAL. 30 UNITS IN AM AND 25 UNITS BEFORE DINNER     Endocrinology:  Diabetes - Insulins Failed - 09/21/2020  5:07 PM      Failed - HBA1C is between 0 and 7.9 and within 180 days    Hemoglobin A1C  Date Value Ref Range Status  06/30/2020 10.3  Final    Comment:    POC   Hgb A1c MFr Bld  Date Value Ref Range Status  08/27/2020 10.5 (H) 4.8 - 5.6 % Final    Comment:    (NOTE) Pre diabetes:          5.7%-6.4%  Diabetes:              >6.4%  Glycemic control for   <7.0% adults with diabetes          Passed - Valid encounter within last 6 months    Recent Outpatient Visits          2 months ago Atherosclerosis of aorta [facility])   [facility] Alba Cory, MD   6 months ago Acute neck pain   [facility], Danna Hefty, MD   6 months ago Uncontrolled type 2 diabetes mellitus with hyperglycemia [facility])   [facility], Danna Hefty, MD   9 months ago Uncontrolled type 2 diabetes mellitus with hyperglycemia [facility])   [facility], Danna Hefty, MD   11 months ago Uncontrolled type 2 diabetes mellitus with hyperglycemia [facility])   [facility] Alba Cory, MD      Future Appointments            In 2 days Gabriel Cirri, NP [facility]   In 4 days Vanna Scotland, MD [facility]   In 1 month Alba Cory, MD [facility]   In 10 months  [facility]

## 2020-09-23 ENCOUNTER — Ambulatory Visit (INDEPENDENT_AMBULATORY_CARE_PROVIDER_SITE_OTHER): Payer: Medicare Other | Admitting: Unknown Physician Specialty

## 2020-09-23 ENCOUNTER — Other Ambulatory Visit: Payer: Self-pay

## 2020-09-23 ENCOUNTER — Encounter: Payer: Self-pay | Admitting: Unknown Physician Specialty

## 2020-09-23 ENCOUNTER — Telehealth: Payer: Self-pay | Admitting: Family Medicine

## 2020-09-23 DIAGNOSIS — K3532 Acute appendicitis with perforation and localized peritonitis, without abscess: Secondary | ICD-10-CM

## 2020-09-23 DIAGNOSIS — I10 Essential (primary) hypertension: Secondary | ICD-10-CM

## 2020-09-23 DIAGNOSIS — E1165 Type 2 diabetes mellitus with hyperglycemia: Secondary | ICD-10-CM

## 2020-09-23 DIAGNOSIS — M19011 Primary osteoarthritis, right shoulder: Secondary | ICD-10-CM | POA: Diagnosis not present

## 2020-09-23 DIAGNOSIS — E1169 Type 2 diabetes mellitus with other specified complication: Secondary | ICD-10-CM | POA: Diagnosis not present

## 2020-09-23 DIAGNOSIS — Z8719 Personal history of other diseases of the digestive system: Secondary | ICD-10-CM | POA: Diagnosis not present

## 2020-09-23 DIAGNOSIS — Z9049 Acquired absence of other specified parts of digestive tract: Secondary | ICD-10-CM | POA: Diagnosis not present

## 2020-09-23 DIAGNOSIS — Z48815 Encounter for surgical aftercare following surgery on the digestive system: Secondary | ICD-10-CM | POA: Diagnosis not present

## 2020-09-23 DIAGNOSIS — Z9181 History of falling: Secondary | ICD-10-CM | POA: Diagnosis not present

## 2020-09-23 DIAGNOSIS — K9189 Other postprocedural complications and disorders of digestive system: Secondary | ICD-10-CM | POA: Diagnosis not present

## 2020-09-23 DIAGNOSIS — E785 Hyperlipidemia, unspecified: Secondary | ICD-10-CM | POA: Diagnosis not present

## 2020-09-23 DIAGNOSIS — E1140 Type 2 diabetes mellitus with diabetic neuropathy, unspecified: Secondary | ICD-10-CM | POA: Diagnosis not present

## 2020-09-23 DIAGNOSIS — F17210 Nicotine dependence, cigarettes, uncomplicated: Secondary | ICD-10-CM | POA: Diagnosis not present

## 2020-09-23 MED ORDER — OZEMPIC (0.25 OR 0.5 MG/DOSE) 2 MG/1.5ML ~~LOC~~ SOPN: 0.5000 mg | PEN_INJECTOR | SUBCUTANEOUS | 2 refills | Status: DC

## 2020-09-23 NOTE — Assessment & Plan Note (Addendum)
Poorly controlled blood sugar.  Difficult to titrate insulin due to inconsistency with checking BS and not noting time of day.  Diet is poor but looks unlikely to change.  Tried Jardiance and got a yeast infection.  Metformin made him break out.  Start Ozempic weekly.  Sample given.  Start at .25 for one week and can increase to 0.5 dose if tolerated.  With blood sugar so high, did not adjust insulin dose

## 2020-09-23 NOTE — Telephone Encounter (Signed)
Called to request verbal orders for OT - 1xwk 2, 2xwk 3.  Please call with any questions at [phone]

## 2020-09-23 NOTE — Assessment & Plan Note (Signed)
Stable, continue present medications.   

## 2020-09-23 NOTE — Progress Notes (Signed)
BP 128/72   Pulse 74   Temp 98.1 F (36.7 C) (Oral)   Resp 16   Ht 5\' 8"  (1.727 m)   Wt 174 lb 14.4 oz (79.3 kg)   SpO2 99%   BMI 26.59 kg/m    Subjective:    Patient ID: William Parks, male    DOB: 06-Apr-1946, 75 y.o.   MRN: [id number]  HPI: William Parks is a 75 y.o. male  Chief Complaint  Patient presents with  . Follow-up    Hospital recheck   Pt is here with grandson who gives part of the hx and helps explain information to pt.    Diabetes: Using medications without difficulties - Taking 30 u in the AM and 25 u at nigh No hypoglycemic episodes No hyperglycemic episodes Feet problems:none Blood Sugars averaging: around 200.  299 is the highest.   eye exam within last year Last Hgb A1C: 10.5 on 5/4  Hypertension  Using medications without difficulty Average home BPs   Using medication without problems or lightheadedness No chest pain with exertion or shortness of breath No Edema  Elevated Cholesterol Using medications without problems No Muscle aches    S/p Appendectomy  Pt evaluated in the ER on 5/20 for the following:   history of ruptured appendicitis, diabetes, hypertension presented to the emergency department for treatment and evaluation of surgical wound.  He had an open appendectomy approximately 27 days ago.  In the ER,  drainage was noted from the surgical site.  He had 2 open areas of concern.  Pt states thise areas are doing better.    Relevant past medical, surgical, family and social history reviewed and updated as indicated. Interim medical history since our last visit reviewed. Allergies and medications reviewed and updated.  Review of Systems  Per HPI unless specifically indicated above     Objective:    BP 128/72   Pulse 74   Temp 98.1 F (36.7 C) (Oral)   Resp 16   Ht 5\' 8"  (1.727 m)   Wt 174 lb 14.4 oz (79.3 kg)   SpO2 99%   BMI 26.59 kg/m   Wt Readings from Last 3 Encounters:  09/23/20 174 lb 14.4 oz (79.3 kg)   09/12/20 180 lb (81.6 kg)  09/08/20 187 lb 6.3 oz (85 kg)    Physical Exam Constitutional:      General: He is not in acute distress.    Appearance: Normal appearance. He is well-developed.  HENT:     Head: Normocephalic and atraumatic.  Eyes:     General: Lids are normal. No scleral icterus.       Right eye: No discharge.        Left eye: No discharge.     Conjunctiva/sclera: Conjunctivae normal.  Neck:     Vascular: No carotid bruit or JVD.  Cardiovascular:     Rate and Rhythm: Normal rate and regular rhythm.     Heart sounds: Normal heart sounds.  Pulmonary:     Effort: Pulmonary effort is normal. No respiratory distress.     Breath sounds: Normal breath sounds.  Abdominal:     Palpations: There is no hepatomegaly or splenomegaly.  Musculoskeletal:        General: Normal range of motion.     Cervical back: Normal range of motion and neck supple.  Skin:    General: Skin is warm and dry.     Coloration: Skin is not pale.     Findings: No rash.  Neurological:     Mental Status: He is alert and oriented to person, place, and time.  Psychiatric:        Behavior: Behavior normal.        Thought Content: Thought content normal.        Judgment: Judgment normal.      Labs done 3 weeks ago and were normal.  GFR is >60  Assessment & Plan:   Problem List Items Addressed This Visit      Unprioritized   DM (diabetes mellitus), type 2, uncontrolled (HCC)    Poorly controlled blood sugar.  Difficult to titrate insulin due to inconsistency with checking BS and not noting time of day.  Diet is poor but looks unlikely to change.  Tried Jardiance and got a yeast infection.  Metformin made him break out.  Start Ozempic weekly.  Sample given.  Start at .25 for one week and can increase to 0.5 dose if tolerated.  With blood sugar so high, did not adjust insulin dose      Relevant Medications   Semaglutide,0.25 or 0.5MG /DOS, (OZEMPIC, 0.25 OR 0.5 MG/DOSE,) 2 MG/1.5ML SOPN    Hypertension    Stable, continue present medications.        Ruptured appendicitis    With wound dehiscence.  Dressing in place.  Some drainage but pt states it is better.  Seeing surgeon tomorrow so did not undress wound.            Follow up plan: Return in about 1 month (around 10/23/2020).

## 2020-09-23 NOTE — Assessment & Plan Note (Signed)
With wound dehiscence.  Dressing in place.  Some drainage but pt states it is better.  Seeing surgeon tomorrow so did not undress wound.

## 2020-09-24 ENCOUNTER — Ambulatory Visit (INDEPENDENT_AMBULATORY_CARE_PROVIDER_SITE_OTHER): Payer: Medicare Other | Admitting: Surgery

## 2020-09-24 ENCOUNTER — Telehealth: Payer: Self-pay | Admitting: Family Medicine

## 2020-09-24 ENCOUNTER — Encounter: Payer: Medicare Other | Admitting: Surgery

## 2020-09-24 ENCOUNTER — Encounter: Payer: Self-pay | Admitting: Surgery

## 2020-09-24 VITALS — BP 138/78 | HR 86 | Temp 98.5°F | Ht 68.0 in | Wt 175.8 lb

## 2020-09-24 DIAGNOSIS — Z09 Encounter for follow-up examination after completed treatment for conditions other than malignant neoplasm: Secondary | ICD-10-CM

## 2020-09-24 DIAGNOSIS — K3532 Acute appendicitis with perforation and localized peritonitis, without abscess: Secondary | ICD-10-CM

## 2020-09-24 NOTE — Progress Notes (Signed)
09/24/2020  HPI: William Parks is a 75 y.o. male s/p right colectomy on 09/01/20 for perforated appendicitis not improving after conservative treatment.  He was discharged on 5/19 after post-op ileus and TPN.  He was seen in the ED on 5/20 due to dehiscence of the midline incision at two spots.    Today, patient reports that he's doing well.  He's eating better, having normal bowel function, with improving mobility, though still with some soreness at the incision.  Vital signs: BP 138/78   Pulse 86   Temp 98.5 F (36.9 C) (Oral)   Ht 5\' 8"  (1.727 m)   Wt 175 lb 12.8 oz (79.7 kg)   SpO2 97%   BMI 26.73 kg/m    Physical Exam: Constitutional: No acute distress Abdomen:  Soft, non-distended, appropriately tender.  Superior dehiscence spot is now covered with scab tissue but appears to be healing well.  Inferior portion with good granulation tissue, dressed with wet to dry gauze.  Assessment/Plan: This is a 75 y.o. male s/p right colectomy  --Patient is overall healing well despite of the wound dehiscence.  Instructed about wet to dry dressing changes daily.  No antibiotic is needed.   --Encouraged patient to continue eating well, have more protein intake to help with the healing process. --Follow up in three weeks.   60, MD [facility]

## 2020-09-24 NOTE — Telephone Encounter (Signed)
.   Home Health Verbal Orders -[facility] Callback Number: [phone] OPT 2  Requesting OT/PT/ Frequency:  PT/1x1 2x2 / OT/1x2 2x3

## 2020-09-24 NOTE — Patient Instructions (Addendum)
Please change the bandage daily to avoid any infection.    If you have any concerns or questions, please feel free to call our office. See your follow up appointment below.   GENERAL POST-OPERATIVE PATIENT INSTRUCTIONS   WOUND CARE INSTRUCTIONS:  Keep a dry clean dressing on the wound if there is drainage. The initial bandage may be removed after 24 hours.  Once the wound has quit draining you may leave it open to air.  If clothing rubs against the wound or causes irritation and the wound is not draining you may cover it with a dry dressing during the daytime.  Try to keep the wound dry and avoid ointments on the wound unless directed to do so.  If the wound becomes bright red and painful or starts to drain infected material that is not clear, please contact your physician immediately.  If the wound is mildly pink and has a thick firm ridge underneath it, this is normal, and is referred to as a healing ridge.  This will resolve over the next 4-6 weeks.  BATHING: You may shower if you have been informed of this by your surgeon. However, Please do not submerge in a tub, hot tub, or pool until incisions are completely sealed or have been told by your surgeon that you may do so.  DIET:  You may eat any foods that you can tolerate.  It is a good idea to eat a high fiber diet and take in plenty of fluids to prevent constipation.  If you do become constipated you may want to take a mild laxative or take ducolax tablets on a daily basis until your bowel habits are regular.  Constipation can be very uncomfortable, along with straining, after recent surgery.  ACTIVITY:  You are encouraged to cough and deep breath or use your incentive spirometer if you were given one, every 15-30 minutes when awake.  This will help prevent respiratory complications and low grade fevers post-operatively if you had a general anesthetic.  You may want to hug a pillow when coughing and sneezing to add additional support to the  surgical area, if you had abdominal or chest surgery, which will decrease pain during these times.  You are encouraged to walk and engage in light activity for the next two weeks.  You should not lift more than 15 pounds, until 09/28/2020 as it could put you at increased risk for complications.  Twenty pounds is roughly equivalent to a plastic bag of groceries. At that time- Listen to your body when lifting, if you have pain when lifting, stop and then try again in a few days. Soreness after doing exercises or activities of daily living is normal as you get back in to your normal routine.  MEDICATIONS:  Try to take narcotic medications and anti-inflammatory medications, such as tylenol, ibuprofen, naprosyn, etc., with food.  This will minimize stomach upset from the medication.  Should you develop nausea and vomiting from the pain medication, or develop a rash, please discontinue the medication and contact your physician.  You should not drive, make important decisions, or operate machinery when taking narcotic pain medication.  SUNBLOCK Use sun block to incision area over the next year if this area will be exposed to sun. This helps decrease scarring and will allow you avoid a permanent darkened area over your incision.      Wound Care, Adult Taking care of your wound properly can help to prevent pain, infection, and scarring. It can also  help your wound heal more quickly. Follow instructions from your health care provider about how to care for your wound. Supplies needed:  Soap and water.  Wound cleanser.  Gauze.  If needed, a clean bandage (dressing) or other type of wound dressing material to cover or place in the wound. Follow your health care provider's instructions about what dressing supplies to use.  Cream or ointment to apply to the wound, if told by your health care provider. How to care for your wound Cleaning the wound Ask your health care provider how to clean the wound. This  may include:  Using mild soap and water or a wound cleanser.  Using a clean gauze to pat the wound dry after cleaning it. Do not rub or scrub the wound. Dressing care  Wash your hands with soap and water for at least 20 seconds before and after you change the dressing. If soap and water are not available, use hand sanitizer.  Change your dressing as told by your health care provider. This may include: ? Cleaning or rinsing out (irrigating) the wound. ? Placing a dressing over the wound or in the wound (packing). ? Covering the wound with an outer dressing.  Leave any stitches (sutures), skin glue, or adhesive strips in place. These skin closures may need to stay in place for 2 weeks or longer. If adhesive strip edges start to loosen and curl up, you may trim the loose edges. Do not remove adhesive strips completely unless your health care provider tells you to do that.  Ask your health care provider when you can leave the wound uncovered. Checking for infection Check your wound area every day for signs of infection. Check for:  More redness, swelling, or pain.  Fluid or blood.  Warmth.  Pus or a bad smell.   Follow these instructions at home Medicines  If you were prescribed an antibiotic medicine, cream, or ointment, take or apply it as told by your health care provider. Do not stop using the antibiotic even if your condition improves.  If you were prescribed pain medicine, take it 30 minutes before you do any wound care or as told by your health care provider.  Take over-the-counter and prescription medicines only as told by your health care provider. Eating and drinking  Eat a diet that includes protein, vitamin A, vitamin C, and other nutrient-rich foods to help the wound heal. ? Foods rich in protein include meat, fish, eggs, dairy, beans, and nuts. ? Foods rich in vitamin A include carrots and dark green, leafy vegetables. ? Foods rich in vitamin C include citrus fruits,  tomatoes, broccoli, and peppers.  Drink enough fluid to keep your urine pale yellow. General instructions  Do not take baths, swim, use a hot tub, or do anything that would put the wound underwater until your health care provider approves. Ask your health care provider if you may take showers. You may only be allowed to take sponge baths.  Do not scratch or pick at the wound. Keep it covered as told by your health care provider.  Return to your normal activities as told by your health care provider. Ask your health care provider what activities are safe for you.  Protect your wound from the sun when you are outside for the first 6 months, or for as long as told by your health care provider. Cover up the scar area or apply sunscreen that has an SPF of at least 30.  Do not use  any products that contain nicotine or tobacco, such as cigarettes, e-cigarettes, and chewing tobacco. These may delay wound healing. If you need help quitting, ask your health care provider.  Keep all follow-up visits as told by your health care provider. This is important. Contact a health care provider if:  You received a tetanus shot and you have swelling, severe pain, redness, or bleeding at the injection site.  Your pain is not controlled with medicine.  You have any of these signs of infection: ? More redness, swelling, or pain around the wound. ? Fluid or blood coming from the wound. ? Warmth coming from the wound. ? Pus or a bad smell coming from the wound. ? A fever or chills.  You are nauseous or you vomit.  You are dizzy. Get help right away if:  You have a red streak of skin near the area around your wound.  Your wound has been closed with staples, sutures, skin glue, or adhesive strips and it begins to open up and separate.  Your wound is bleeding, and the bleeding does not stop with gentle pressure.  You have a rash.  You faint.  You have trouble breathing. These symptoms may represent a  serious problem that is an emergency. Do not wait to see if the symptoms will go away. Get medical help right away. Call your local emergency services (911 in the [place] Do not drive yourself to the hospital. Summary  Always wash your hands with soap and water for at least 20 seconds before and after changing your dressing.  Change your dressing as told by your health care provider.  To help with healing, eat foods that are rich in protein, vitamin A, vitamin C, and other nutrients.  Check your wound every day for signs of infection. Contact your health care provider if you suspect that your wound is infected. This information is not intended to replace advice given to you by your health care provider. Make sure you discuss any questions you have with your health care provider. Document Revised: 01/26/2019 Document Reviewed: 01/26/2019 Elsevier Patient Education  2021 [employer].

## 2020-09-24 NOTE — Telephone Encounter (Signed)
Verbal given 

## 2020-09-25 ENCOUNTER — Ambulatory Visit: Payer: Medicare Other | Admitting: Urology

## 2020-09-25 DIAGNOSIS — E1165 Type 2 diabetes mellitus with hyperglycemia: Secondary | ICD-10-CM | POA: Diagnosis not present

## 2020-09-25 DIAGNOSIS — Z9049 Acquired absence of other specified parts of digestive tract: Secondary | ICD-10-CM | POA: Diagnosis not present

## 2020-09-25 DIAGNOSIS — Z48815 Encounter for surgical aftercare following surgery on the digestive system: Secondary | ICD-10-CM | POA: Diagnosis not present

## 2020-09-25 DIAGNOSIS — F17210 Nicotine dependence, cigarettes, uncomplicated: Secondary | ICD-10-CM | POA: Diagnosis not present

## 2020-09-25 DIAGNOSIS — E1169 Type 2 diabetes mellitus with other specified complication: Secondary | ICD-10-CM | POA: Diagnosis not present

## 2020-09-25 DIAGNOSIS — Z8719 Personal history of other diseases of the digestive system: Secondary | ICD-10-CM | POA: Diagnosis not present

## 2020-09-25 DIAGNOSIS — E1140 Type 2 diabetes mellitus with diabetic neuropathy, unspecified: Secondary | ICD-10-CM | POA: Diagnosis not present

## 2020-09-25 DIAGNOSIS — K9189 Other postprocedural complications and disorders of digestive system: Secondary | ICD-10-CM | POA: Diagnosis not present

## 2020-09-25 DIAGNOSIS — I10 Essential (primary) hypertension: Secondary | ICD-10-CM | POA: Diagnosis not present

## 2020-09-25 DIAGNOSIS — M19011 Primary osteoarthritis, right shoulder: Secondary | ICD-10-CM | POA: Diagnosis not present

## 2020-09-25 DIAGNOSIS — Z9181 History of falling: Secondary | ICD-10-CM | POA: Diagnosis not present

## 2020-09-25 DIAGNOSIS — E785 Hyperlipidemia, unspecified: Secondary | ICD-10-CM | POA: Diagnosis not present

## 2020-09-25 NOTE — Telephone Encounter (Signed)
Verbal orders given to Tiffany per Dr.Sowles.

## 2020-09-26 DIAGNOSIS — E1140 Type 2 diabetes mellitus with diabetic neuropathy, unspecified: Secondary | ICD-10-CM | POA: Diagnosis not present

## 2020-09-26 DIAGNOSIS — Z9181 History of falling: Secondary | ICD-10-CM | POA: Diagnosis not present

## 2020-09-26 DIAGNOSIS — Z48815 Encounter for surgical aftercare following surgery on the digestive system: Secondary | ICD-10-CM | POA: Diagnosis not present

## 2020-09-26 DIAGNOSIS — E1165 Type 2 diabetes mellitus with hyperglycemia: Secondary | ICD-10-CM | POA: Diagnosis not present

## 2020-09-26 DIAGNOSIS — E785 Hyperlipidemia, unspecified: Secondary | ICD-10-CM | POA: Diagnosis not present

## 2020-09-26 DIAGNOSIS — K9189 Other postprocedural complications and disorders of digestive system: Secondary | ICD-10-CM | POA: Diagnosis not present

## 2020-09-26 DIAGNOSIS — Z8719 Personal history of other diseases of the digestive system: Secondary | ICD-10-CM | POA: Diagnosis not present

## 2020-09-26 DIAGNOSIS — F17210 Nicotine dependence, cigarettes, uncomplicated: Secondary | ICD-10-CM | POA: Diagnosis not present

## 2020-09-26 DIAGNOSIS — M19011 Primary osteoarthritis, right shoulder: Secondary | ICD-10-CM | POA: Diagnosis not present

## 2020-09-26 DIAGNOSIS — E1169 Type 2 diabetes mellitus with other specified complication: Secondary | ICD-10-CM | POA: Diagnosis not present

## 2020-09-26 DIAGNOSIS — I10 Essential (primary) hypertension: Secondary | ICD-10-CM | POA: Diagnosis not present

## 2020-09-26 DIAGNOSIS — Z9049 Acquired absence of other specified parts of digestive tract: Secondary | ICD-10-CM | POA: Diagnosis not present

## 2020-10-01 ENCOUNTER — Ambulatory Visit: Payer: Medicare Other | Admitting: Urology

## 2020-10-01 DIAGNOSIS — E785 Hyperlipidemia, unspecified: Secondary | ICD-10-CM | POA: Diagnosis not present

## 2020-10-01 DIAGNOSIS — F17210 Nicotine dependence, cigarettes, uncomplicated: Secondary | ICD-10-CM | POA: Diagnosis not present

## 2020-10-01 DIAGNOSIS — Z8719 Personal history of other diseases of the digestive system: Secondary | ICD-10-CM | POA: Diagnosis not present

## 2020-10-01 DIAGNOSIS — Z9181 History of falling: Secondary | ICD-10-CM | POA: Diagnosis not present

## 2020-10-01 DIAGNOSIS — M19011 Primary osteoarthritis, right shoulder: Secondary | ICD-10-CM | POA: Diagnosis not present

## 2020-10-01 DIAGNOSIS — Z48815 Encounter for surgical aftercare following surgery on the digestive system: Secondary | ICD-10-CM | POA: Diagnosis not present

## 2020-10-01 DIAGNOSIS — I10 Essential (primary) hypertension: Secondary | ICD-10-CM | POA: Diagnosis not present

## 2020-10-01 DIAGNOSIS — Z9049 Acquired absence of other specified parts of digestive tract: Secondary | ICD-10-CM | POA: Diagnosis not present

## 2020-10-01 DIAGNOSIS — E1165 Type 2 diabetes mellitus with hyperglycemia: Secondary | ICD-10-CM | POA: Diagnosis not present

## 2020-10-01 DIAGNOSIS — K9189 Other postprocedural complications and disorders of digestive system: Secondary | ICD-10-CM | POA: Diagnosis not present

## 2020-10-01 DIAGNOSIS — E1169 Type 2 diabetes mellitus with other specified complication: Secondary | ICD-10-CM | POA: Diagnosis not present

## 2020-10-01 DIAGNOSIS — E1140 Type 2 diabetes mellitus with diabetic neuropathy, unspecified: Secondary | ICD-10-CM | POA: Diagnosis not present

## 2020-10-03 ENCOUNTER — Encounter: Payer: Self-pay | Admitting: Urology

## 2020-10-03 DIAGNOSIS — Z9049 Acquired absence of other specified parts of digestive tract: Secondary | ICD-10-CM | POA: Diagnosis not present

## 2020-10-03 DIAGNOSIS — E1165 Type 2 diabetes mellitus with hyperglycemia: Secondary | ICD-10-CM | POA: Diagnosis not present

## 2020-10-03 DIAGNOSIS — E785 Hyperlipidemia, unspecified: Secondary | ICD-10-CM | POA: Diagnosis not present

## 2020-10-03 DIAGNOSIS — M19011 Primary osteoarthritis, right shoulder: Secondary | ICD-10-CM | POA: Diagnosis not present

## 2020-10-03 DIAGNOSIS — K9189 Other postprocedural complications and disorders of digestive system: Secondary | ICD-10-CM | POA: Diagnosis not present

## 2020-10-03 DIAGNOSIS — Z8719 Personal history of other diseases of the digestive system: Secondary | ICD-10-CM | POA: Diagnosis not present

## 2020-10-03 DIAGNOSIS — E1140 Type 2 diabetes mellitus with diabetic neuropathy, unspecified: Secondary | ICD-10-CM | POA: Diagnosis not present

## 2020-10-03 DIAGNOSIS — I10 Essential (primary) hypertension: Secondary | ICD-10-CM | POA: Diagnosis not present

## 2020-10-03 DIAGNOSIS — Z9181 History of falling: Secondary | ICD-10-CM | POA: Diagnosis not present

## 2020-10-03 DIAGNOSIS — Z48815 Encounter for surgical aftercare following surgery on the digestive system: Secondary | ICD-10-CM | POA: Diagnosis not present

## 2020-10-03 DIAGNOSIS — E1169 Type 2 diabetes mellitus with other specified complication: Secondary | ICD-10-CM | POA: Diagnosis not present

## 2020-10-03 DIAGNOSIS — F17210 Nicotine dependence, cigarettes, uncomplicated: Secondary | ICD-10-CM | POA: Diagnosis not present

## 2020-10-06 DIAGNOSIS — E1165 Type 2 diabetes mellitus with hyperglycemia: Secondary | ICD-10-CM | POA: Diagnosis not present

## 2020-10-06 DIAGNOSIS — M19011 Primary osteoarthritis, right shoulder: Secondary | ICD-10-CM | POA: Diagnosis not present

## 2020-10-06 DIAGNOSIS — Z9049 Acquired absence of other specified parts of digestive tract: Secondary | ICD-10-CM | POA: Diagnosis not present

## 2020-10-06 DIAGNOSIS — E1169 Type 2 diabetes mellitus with other specified complication: Secondary | ICD-10-CM | POA: Diagnosis not present

## 2020-10-06 DIAGNOSIS — F17210 Nicotine dependence, cigarettes, uncomplicated: Secondary | ICD-10-CM | POA: Diagnosis not present

## 2020-10-06 DIAGNOSIS — K9189 Other postprocedural complications and disorders of digestive system: Secondary | ICD-10-CM | POA: Diagnosis not present

## 2020-10-06 DIAGNOSIS — I10 Essential (primary) hypertension: Secondary | ICD-10-CM | POA: Diagnosis not present

## 2020-10-06 DIAGNOSIS — E785 Hyperlipidemia, unspecified: Secondary | ICD-10-CM | POA: Diagnosis not present

## 2020-10-06 DIAGNOSIS — Z8719 Personal history of other diseases of the digestive system: Secondary | ICD-10-CM | POA: Diagnosis not present

## 2020-10-06 DIAGNOSIS — Z9181 History of falling: Secondary | ICD-10-CM | POA: Diagnosis not present

## 2020-10-06 DIAGNOSIS — Z48815 Encounter for surgical aftercare following surgery on the digestive system: Secondary | ICD-10-CM | POA: Diagnosis not present

## 2020-10-06 DIAGNOSIS — E1140 Type 2 diabetes mellitus with diabetic neuropathy, unspecified: Secondary | ICD-10-CM | POA: Diagnosis not present

## 2020-10-08 DIAGNOSIS — Z9049 Acquired absence of other specified parts of digestive tract: Secondary | ICD-10-CM | POA: Diagnosis not present

## 2020-10-08 DIAGNOSIS — Z8719 Personal history of other diseases of the digestive system: Secondary | ICD-10-CM | POA: Diagnosis not present

## 2020-10-08 DIAGNOSIS — E1165 Type 2 diabetes mellitus with hyperglycemia: Secondary | ICD-10-CM | POA: Diagnosis not present

## 2020-10-08 DIAGNOSIS — E1169 Type 2 diabetes mellitus with other specified complication: Secondary | ICD-10-CM | POA: Diagnosis not present

## 2020-10-08 DIAGNOSIS — K9189 Other postprocedural complications and disorders of digestive system: Secondary | ICD-10-CM | POA: Diagnosis not present

## 2020-10-08 DIAGNOSIS — Z48815 Encounter for surgical aftercare following surgery on the digestive system: Secondary | ICD-10-CM | POA: Diagnosis not present

## 2020-10-08 DIAGNOSIS — M19011 Primary osteoarthritis, right shoulder: Secondary | ICD-10-CM | POA: Diagnosis not present

## 2020-10-08 DIAGNOSIS — E785 Hyperlipidemia, unspecified: Secondary | ICD-10-CM | POA: Diagnosis not present

## 2020-10-08 DIAGNOSIS — Z9181 History of falling: Secondary | ICD-10-CM | POA: Diagnosis not present

## 2020-10-08 DIAGNOSIS — E1140 Type 2 diabetes mellitus with diabetic neuropathy, unspecified: Secondary | ICD-10-CM | POA: Diagnosis not present

## 2020-10-08 DIAGNOSIS — I10 Essential (primary) hypertension: Secondary | ICD-10-CM | POA: Diagnosis not present

## 2020-10-08 DIAGNOSIS — F17210 Nicotine dependence, cigarettes, uncomplicated: Secondary | ICD-10-CM | POA: Diagnosis not present

## 2020-10-09 DIAGNOSIS — Z9181 History of falling: Secondary | ICD-10-CM | POA: Diagnosis not present

## 2020-10-09 DIAGNOSIS — F17210 Nicotine dependence, cigarettes, uncomplicated: Secondary | ICD-10-CM | POA: Diagnosis not present

## 2020-10-09 DIAGNOSIS — Z8719 Personal history of other diseases of the digestive system: Secondary | ICD-10-CM | POA: Diagnosis not present

## 2020-10-09 DIAGNOSIS — Z48815 Encounter for surgical aftercare following surgery on the digestive system: Secondary | ICD-10-CM | POA: Diagnosis not present

## 2020-10-09 DIAGNOSIS — M19011 Primary osteoarthritis, right shoulder: Secondary | ICD-10-CM | POA: Diagnosis not present

## 2020-10-09 DIAGNOSIS — I10 Essential (primary) hypertension: Secondary | ICD-10-CM | POA: Diagnosis not present

## 2020-10-09 DIAGNOSIS — E1169 Type 2 diabetes mellitus with other specified complication: Secondary | ICD-10-CM | POA: Diagnosis not present

## 2020-10-09 DIAGNOSIS — K9189 Other postprocedural complications and disorders of digestive system: Secondary | ICD-10-CM | POA: Diagnosis not present

## 2020-10-09 DIAGNOSIS — E785 Hyperlipidemia, unspecified: Secondary | ICD-10-CM | POA: Diagnosis not present

## 2020-10-09 DIAGNOSIS — Z9049 Acquired absence of other specified parts of digestive tract: Secondary | ICD-10-CM | POA: Diagnosis not present

## 2020-10-09 DIAGNOSIS — E1140 Type 2 diabetes mellitus with diabetic neuropathy, unspecified: Secondary | ICD-10-CM | POA: Diagnosis not present

## 2020-10-09 DIAGNOSIS — E1165 Type 2 diabetes mellitus with hyperglycemia: Secondary | ICD-10-CM | POA: Diagnosis not present

## 2020-10-10 ENCOUNTER — Other Ambulatory Visit: Payer: Self-pay

## 2020-10-10 ENCOUNTER — Ambulatory Visit (INDEPENDENT_AMBULATORY_CARE_PROVIDER_SITE_OTHER): Payer: Medicare Other | Admitting: Surgery

## 2020-10-10 ENCOUNTER — Encounter: Payer: Self-pay | Admitting: Surgery

## 2020-10-10 VITALS — BP 123/76 | HR 87 | Ht 68.0 in | Wt 176.6 lb

## 2020-10-10 DIAGNOSIS — K3532 Acute appendicitis with perforation and localized peritonitis, without abscess: Secondary | ICD-10-CM

## 2020-10-10 DIAGNOSIS — Z09 Encounter for follow-up examination after completed treatment for conditions other than malignant neoplasm: Secondary | ICD-10-CM

## 2020-10-10 NOTE — Patient Instructions (Addendum)
Your body will continue to absorb the blister. The surgical area looks good.  May rub Vitamin-E oil or other emmolient agent in area 2-3 times a day to soften. You will need to use sunscreen for the next year on the area to minimize altered pigmentation of the site.  You may shower and take a bath. You have no restrictions on activities or diet.   You may add fiber to your diet if your stools start to be come loose.   You may need to have a colonoscopy done, you should speak with your primary care provider about this.   Follow-up with our office as needed.  Please call and ask to speak with a nurse if you develop questions or concerns.

## 2020-10-10 NOTE — Progress Notes (Signed)
10/10/2020  HPI: William Parks is a 75 y.o. male s/p diagnostic laparoscopy, followed by open right colectomy for perforated appendicitis which was not improving with conservative management on 09/01/20.  Patient was last seen in the office on 6/1 at which time his midline wound was healing.  He had previously dehisced on 5/20 when he presented to emergency room and 2 spots and was requiring wet-to-dry dressing changes.  On 6/1 the wounds were getting smaller.  Today the patient reports that the wounds are fully healed and that he is doing very well.  Denies any abdominal pain, nausea, vomiting.  He is tolerating a normal diet.  He is having normal bowel function and reports that he has had about 2 bowel movements a day.  He is wondering if that is normal.  Vital signs: BP 123/76   Pulse 87   Ht 5\' 8"  (1.727 m)   Wt 176 lb 9.6 oz (80.1 kg)   SpO2 98%   BMI 26.85 kg/m    Physical Exam: Constitutional: No acute distress Abdomen: Soft, nondistended, nontender to palpation.  Midline incision is not fully healed particularly with 2 spots that had been previously opened.  No evidence of hernia or other complications.  Assessment/Plan: This is a 75 y.o. male s/p diagnostic laparoscopy followed by open right colectomy for perforated appendicitis.  - Patient is currently doing very well with no complications and all his wounds are fully healed.  Discussed with him that it is normal to have about 2 bowel movements a day after having colon surgery particularly with removal of a portion of his colon.  At this point, I encouraged him to start increasing his fiber intake that could help with bulking of his stool and if there is concerns for further diarrhea, he could start Imodium as well.  However 1-2 bowel movements a day is normal from the surgical standpoint. - Patient's pathology did not show any masses, polyps, or any concern for malignancy.  However the patient has never had a colonoscopy.  I encouraged  him to contact his PCP to set up a colonoscopy referral. -Follow-up with 73 as needed.   [place], MD [facility]

## 2020-10-17 ENCOUNTER — Encounter: Payer: Medicare Other | Admitting: Surgery

## 2020-11-08 ENCOUNTER — Other Ambulatory Visit: Payer: Self-pay

## 2020-11-08 ENCOUNTER — Encounter: Payer: Self-pay | Admitting: Emergency Medicine

## 2020-11-08 ENCOUNTER — Ambulatory Visit
Admission: EM | Admit: 2020-11-08 | Discharge: 2020-11-08 | Disposition: A | Discharge status: Home / Self Care | Payer: Medicare Other | Attending: Emergency Medicine | Admitting: Emergency Medicine

## 2020-11-08 DIAGNOSIS — U071 COVID-19: Secondary | ICD-10-CM | POA: Diagnosis not present

## 2020-11-08 MED ORDER — PROMETHAZINE-DM 6.25-15 MG/5ML PO SYRP: 5.0000 mL | ORAL_SOLUTION | Freq: Four times a day (QID) | ORAL | 0 refills | Status: DC | PRN

## 2020-11-08 MED ORDER — IPRATROPIUM BROMIDE 0.06 % NA SOLN: 2.0000 | Freq: Four times a day (QID) | NASAL | 12 refills | Status: DC

## 2020-11-08 MED ORDER — NIRMATRELVIR/RITONAVIR (PAXLOVID)TABLET: 3.0000 | ORAL_TABLET | Freq: Two times a day (BID) | ORAL | 0 refills | Status: AC

## 2020-11-08 MED ORDER — BENZONATATE 100 MG PO CAPS
200.0000 mg | ORAL_CAPSULE | Freq: Three times a day (TID) | ORAL | 0 refills | Status: DC
Start: 2020-11-08 — End: 2021-02-18

## 2020-11-08 NOTE — ED Provider Notes (Signed)
[facility]    CSN: [id number] Arrival date & time: 11/08/20  1341      History   Chief Complaint Chief Complaint  Patient presents with   Cough   Headache    HPI William Parks is a 75 y.o. male.   HPI  75 year old male here for evaluation of cough, headache, runny nose, nasal congestion that started yesterday.  He took a home COVID test this morning that was positive.  Patient reports that he had a sore throat earlier in the week but it resolved before his symptoms occurred.  He denies fever, shortness of breath or wheezing, or GI complaints.  He has had his COVID-vaccine but not his booster shot.  His daughter and grandson were both over the house and they were both exposed to [place].  Patient's most recent GFR is >60 from 09/09/2020.  Past Medical History:  Diagnosis Date   Arthritis    rt shoulder   Diabetes mellitus without complication ([place])    Hyperlipidemia    Hyperlipidemia associated with type 2 diabetes mellitus ([place]) 06/02/2015   Hypertension     Patient Active Problem List   Diagnosis Date Noted   Ruptured appendicitis 08/26/2020   DM2 (diabetes mellitus, type 2) ([place]) 08/26/2020   DM (diabetes mellitus), type 2, uncontrolled ([place]) 08/26/2020   Acute appendicitis with localized peritonitis and abscess    Atherosclerosis of aorta ([place]) 10/03/2019   Neck and shoulder pain 11/08/2016   Tobacco abuse counseling 10/25/2016   Vitamin D deficiency 10/15/2015   Dyslipidemia 06/02/2015   Hypertension 04/01/2015   Uncontrolled type 2 diabetes mellitus with peripheral neuropathy ([place]) 04/01/2015    Past Surgical History:  Procedure Laterality Date   APPENDECTOMY     CYST REMOVAL TRUNK     2x   LAPAROSCOPY N/A 09/01/2020   Procedure: LAPAROSCOPY DIAGNOSTIC;  Surgeon: Olean Ree, MD;  Location: [facility] ORS;  Service: General;  Laterality: N/A;   PARTIAL COLECTOMY N/A 09/01/2020   Procedure: PARTIAL RIGHT COLECTOMY;  Surgeon: Olean Ree, MD;   Location: [facility] OR[facility];  Service: General;  Laterality: N/A;   SHOULDER SURGERY Right 2009   rotator cuff surgery       Home Medications    Prior to Admission medications   Medication Sig Start Date End Date Taking? Authorizing Provider  benzonatate (TESSALON) 100 MG capsule Take 2 capsules (200 mg total) by mouth every 8 (eight) hours. 11/08/20  Yes Margarette Canada, NP  ipratropium (ATROVENT) 0.06 % nasal spray Place 2 sprays into both nostrils 4 (four) times daily. 11/08/20  Yes Margarette Canada, NP  nirmatrelvir/ritonavir EUA (PAXLOVID) TABS Take 3 tablets by mouth 2 (two) times daily for 5 days. Patient GFR is >60. Take nirmatrelvir (150 mg) two tablets twice daily for 5 days and ritonavir (100 mg) one tablet twice daily for 5 days. 11/08/20 11/13/20 Yes Margarette Canada, NP  promethazine-dextromethorphan (PROMETHAZINE-DM) 6.25-15 MG/5ML syrup Take 5 mLs by mouth 4 (four) times daily as needed. 11/08/20  Yes Margarette Canada, NP  aspirin 81 MG EC tablet Take 81 mg by mouth daily. 01/25/15   [provider]  atorvastatin (LIPITOR) 10 MG tablet Take 1 tablet (10 mg total) by mouth daily at 6 PM. 07/16/20   Steele Sizer, MD  blood glucose meter kit and supplies Dispense based on patient and insurance preference. Use up to four times daily as directed. (FOR ICD-10 E10.9, E11.9). 10/18/18   Steele Sizer, MD  docusate sodium (COLACE) 100 MG capsule Take 100 mg  by mouth daily.    [provider]  ibuprofen (ADVIL) 600 MG tablet Take 1 tablet (600 mg total) by mouth every 6 (six) hours as needed. 09/11/20   Tylene Fantasia, PA-C  Insulin Lispro Prot & Lispro (HUMALOG MIX 75/25 KWIKPEN) (75-25) 100 UNIT/ML Kwikpen INJECT INTO THE SKIN 2 (TWO) TIMES DAILY WITH A MEAL. 30 UNITS IN AM AND 25 UNITS BEFORE DINNER 09/21/20   Steele Sizer, MD  Insulin Pen Needle 32G X 6 MM MISC  12/11/19   [provider]  Lancets (ONETOUCH DELICA PLUS [id number]) [place]  10/18/18   [provider]  losartan  (COZAAR) 100 MG tablet Take 1 tablet (100 mg total) by mouth daily. 07/16/20   Steele Sizer, MD  metoprolol tartrate (LOPRESSOR) 25 MG tablet Take 1 tablet (25 mg total) by mouth 2 (two) times daily. 07/16/20   Steele Sizer, MD  NOVOFINE 32G X 6 MM MISC USE AS DIRECTED 05/15/19   Steele Sizer, MD  [facility] ULTRA test strip TEST UP TO 4 TIMES A DAY 11/26/18   Steele Sizer, MD  pregabalin (LYRICA) 150 MG capsule Take 1 capsule (150 mg total) by mouth 2 (two) times daily. Patient taking differently: Take 150 mg by mouth at bedtime. 07/16/20   Steele Sizer, MD  Semaglutide,0.25 or 0.5MG/DOS, (OZEMPIC, 0.25 OR 0.5 MG/DOSE,) 2 MG/1.5ML SOPN Inject 0.5 mg into the skin once a week. 09/23/20   Kathrine Haddock, NP  tiZANidine (ZANAFLEX) 2 MG tablet TAKE 1 TABLET BY MOUTH EVERY EVENING. 09/11/20   Steele Sizer, MD    Family History Family History  Problem Relation Age of Onset   Cancer Mother        Breast CA   Diabetes Mother    Hypertension Mother    Diabetes Brother     Social History Social History   Tobacco Use   Smoking status: Light Smoker    Packs/day: 0.25    Years: 50.00    Pack years: 12.50    Types: Cigarettes    Start date: 07/27/1967   Smokeless tobacco: Never   Tobacco comments:    2-3 cigarettes per day, smoking cessation program information provided  Vaping Use   Vaping Use: Never used  Substance Use Topics   Alcohol use: No    Alcohol/week: 0.0 standard drinks   Drug use: No     Allergies   Patient has no known allergies.   Review of Systems Review of Systems  Constitutional:  Negative for activity change, appetite change and fever.  HENT:  Positive for congestion, rhinorrhea and sore throat.   Respiratory:  Positive for cough. Negative for shortness of breath and wheezing.   Gastrointestinal:  Negative for diarrhea, nausea and vomiting.  Skin:  Negative for rash.  Neurological:  Positive for headaches.  Hematological: Negative.    Psychiatric/Behavioral: Negative.      Physical Exam Triage Vital Signs ED Triage Vitals  Enc Vitals Group     BP 11/08/20 1408 (!) 127/59     Pulse Rate 11/08/20 1408 88     Resp 11/08/20 1408 16     Temp 11/08/20 1408 98.7 F (37.1 C)     Temp Source 11/08/20 1408 Oral     SpO2 11/08/20 1408 99 %     Weight 11/08/20 1406 176 lb 9.4 oz (80.1 kg)     Height 11/08/20 1406 _0  (1.727 m)     Head Circumference --      Peak Flow --  Pain Score 11/08/20 1406 0     Pain Loc --      Pain Edu? --      Excl. in [place]? --    No data found.  Updated Vital Signs BP (!) 127/59 (BP Location: Left Arm)   Pulse 88   Temp 98.7 F (37.1 C) (Oral)   Resp 16   Ht _0  (1.727 m)   Wt 176 lb 9.4 oz (80.1 kg)   SpO2 99%   BMI 26.85 kg/m   Visual Acuity Right Eye Distance:   Left Eye Distance:   Bilateral Distance:    Right Eye Near:   Left Eye Near:    Bilateral Near:     Physical Exam Vitals and nursing note reviewed.  Constitutional:      General: He is not in acute distress.    Appearance: Normal appearance. He is not ill-appearing.  HENT:     Head: Normocephalic and atraumatic.     Right Ear: Tympanic membrane, ear canal and external ear normal. There is no impacted cerumen.     Left Ear: Tympanic membrane, ear canal and external ear normal. There is no impacted cerumen.     Nose: Congestion and rhinorrhea present.     Mouth/Throat:     Mouth: Mucous membranes are moist.     Pharynx: Oropharynx is clear. No posterior oropharyngeal erythema.  Cardiovascular:     Rate and Rhythm: Normal rate and regular rhythm.     Pulses: Normal pulses.     Heart sounds: Normal heart sounds. No murmur heard.   No gallop.  Pulmonary:     Effort: Pulmonary effort is normal.     Breath sounds: Normal breath sounds. No wheezing, rhonchi or rales.  Musculoskeletal:     Cervical back: Normal range of motion and neck supple.  Lymphadenopathy:     Cervical: No cervical adenopathy.   Skin:    General: Skin is warm and dry.     Capillary Refill: Capillary refill takes less than 2 seconds.     Findings: No erythema or rash.  Neurological:     General: No focal deficit present.     Mental Status: He is alert and oriented to person, place, and time.  Psychiatric:        Mood and Affect: Mood normal.        Behavior: Behavior normal.        Thought Content: Thought content normal.        Judgment: Judgment normal.     UC Treatments / Results  Labs (all labs ordered are listed, but only abnormal results are displayed) Labs Reviewed - No data to display  EKG   Radiology No results found.  Procedures Procedures (including critical care time)  Medications Ordered in UC Medications - No data to display  Initial Impression / Assessment and Plan / UC Course  I have reviewed the triage vital signs and the nursing notes.  Pertinent labs & imaging results that were available during my care of the patient were reviewed by me and considered in my medical decision making (see chart for details).  Patient is a very pleasant and nontoxic-appearing 75 year old male who tested positive at home for COVID today and has been experiencing upper respiratory symptoms with a nonproductive cough since yesterday.  Patient's physical exam reveals protegrin tympanic membranes bilaterally with normal light reflex and clear external auditory canals.  Nasal mucosa is markedly erythematous and edematous with clear nasal discharge.  Oropharyngeal  exam is benign.  No cervical lymphadenopathy appreciated exam.  Cardiopulmonary exam reveals clear lung sounds in all fields.  Patient tested positive home for COVID so I do not feel need to repeat a PCR here in clinic.  We will place patient on Paxlovid as he has a recent GFR which was in parameters.  We will also provide Atrovent nasal spray, Tessalon Perles, Promethazine DM cough syrup to help with cough and congestion.  ER precautions reviewed with  patient and family.   Final Clinical Impressions(s) / UC Diagnoses   Final diagnoses:  [id number]     Discharge Instructions      You test positive then you will have to quarantine for 5 days from the start of your symptoms.  After 5 days you can break quarantine if your symptoms have improved and you have not had a fever for 24 hours without taking Tylenol or ibuprofen.  Use over-the-counter Tylenol and ibuprofen as needed for body aches and fever.  Use the Tessalon Perles during the day as needed for cough and the Promethazine DM cough syrup at nighttime as will make you drowsy.  Take the Paxlovid twice daily for 5 days for treatment of COVID-19.  Use the Atrovent nasal spray, 2 squirts in each nostril every 6 hours as needed for nasal congestion.  If you develop any increased shortness of breath-especially at rest, you are unable to speak in full sentences, or is a late sign your lips are turning blue you need to go the ER for evaluation.      ED Prescriptions     Medication Sig Dispense Auth. Provider   benzonatate (TESSALON) 100 MG capsule Take 2 capsules (200 mg total) by mouth every 8 (eight) hours. 21 capsule Margarette Canada, NP   ipratropium (ATROVENT) 0.06 % nasal spray Place 2 sprays into both nostrils 4 (four) times daily. 15 mL Margarette Canada, NP   promethazine-dextromethorphan (PROMETHAZINE-DM) 6.25-15 MG/5ML syrup Take 5 mLs by mouth 4 (four) times daily as needed. 118 mL Margarette Canada, NP   nirmatrelvir/ritonavir EUA (PAXLOVID) TABS Take 3 tablets by mouth 2 (two) times daily for 5 days. Patient GFR is >60. Take nirmatrelvir (150 mg) two tablets twice daily for 5 days and ritonavir (100 mg) one tablet twice daily for 5 days. 30 tablet Margarette Canada, NP      PDMP not reviewed this encounter.   Margarette Canada, NP 11/08/20 1448

## 2020-11-08 NOTE — Discharge Instructions (Addendum)
You test positive then you will have to quarantine for 5 days from the start of your symptoms.  After 5 days you can break quarantine if your symptoms have improved and you have not had a fever for 24 hours without taking Tylenol or ibuprofen.  Use over-the-counter Tylenol and ibuprofen as needed for body aches and fever.  Use the Tessalon Perles during the day as needed for cough and the Promethazine DM cough syrup at nighttime as will make you drowsy.  Take the Paxlovid twice daily for 5 days for treatment of COVID-19.  Use the Atrovent nasal spray, 2 squirts in each nostril every 6 hours as needed for nasal congestion.  If you develop any increased shortness of breath-especially at rest, you are unable to speak in full sentences, or is a late sign your lips are turning blue you need to go the ER for evaluation.

## 2020-11-08 NOTE — ED Triage Notes (Signed)
Patient c/o cough and headache that started yesterday.  Patient took home covid test today and was positive.

## 2020-11-13 NOTE — Progress Notes (Signed)
Name: William Parks   MRN: [id number]    DOB: [birth year]   Date:11/14/2020       Progress Note  Subjective  Chief Complaint  Follow Up  HPI  DMII: he was seen back in April 2021 and A1C was 12.4 %,  11.1 %  to 9.2 % and back in May it was 10.5 % . He is now on dose of insulin  75/25 he is still only using 30 in am and 25 in pm, glucose is 79-225 , usually around 160  He denies polyphagia, polydipsia or polyuria. He has associated HTN, dyslipidemia and neuropathy. He is taking Lyrica.   HTN: he is taking losartan 100 and metoprolol 25 BID , bp is at goal, no chest pain, palpitation or dizziness.    Dyslipidemia: he is taking Atorvastatin, last LDL was at goal at 66, continue medications    Tobacco use: he quit May 4 th, 2022 with the nicotine patch. No cough or wheezing    Intermittent low back pain: he states doing well lately  Right lower weakness: started after appendectomy, right leg goes numb at times, has to do some exercises and using a cane .He had PT and helped some    Malnutrition: he states he used to be in the 210 lbs range, went down to 187 lbs gradually started to gain weight, but in May was admitted for rupture appendix and last week diagnosed with COVID-19. He has lack of appetite and weight is down to 171 lbs . Discussed protein shakes    Atherosclerosis of aorta: on statin and aspirin daily , last LDL at 74 . Continue medication    Patient Active Problem List   Diagnosis Date Noted   S/P right colectomy 11/14/2020   Ruptured appendicitis 08/26/2020   DM2 (diabetes mellitus, type 2) ([place]) 08/26/2020   DM (diabetes mellitus), type 2, uncontrolled ([place]) 08/26/2020   Acute appendicitis with localized peritonitis and abscess    Atherosclerosis of aorta ([place]) 10/03/2019   Neck and shoulder pain 11/08/2016   Tobacco abuse counseling 10/25/2016   Vitamin D deficiency 10/15/2015   Dyslipidemia 06/02/2015   Hypertension 04/01/2015   Uncontrolled type 2 diabetes mellitus  with peripheral neuropathy ([place]) 04/01/2015    Past Surgical History:  Procedure Laterality Date   APPENDECTOMY     CYST REMOVAL TRUNK     2x   LAPAROSCOPY N/A 09/01/2020   Procedure: LAPAROSCOPY DIAGNOSTIC;  Surgeon: Olean Ree, MD;  Location: [facility] ORS;  Service: General;  Laterality: N/A;   PARTIAL COLECTOMY N/A 09/01/2020   Procedure: PARTIAL RIGHT COLECTOMY;  Surgeon: Olean Ree, MD;  Location: [facility] ORS;  Service: General;  Laterality: N/A;   SHOULDER SURGERY Right 2009   rotator cuff surgery    Family History  Problem Relation Age of Onset   Cancer Mother        Breast CA   Diabetes Mother    Hypertension Mother    Diabetes Brother     Social History   Tobacco Use   Smoking status: Former    Packs/day: 0.25    Years: 50.00    Pack years: 12.50    Types: Cigarettes    Start date: 07/27/1967    Quit date: 08/27/2020    Years since quitting: 0.2   Smokeless tobacco: Never  Substance Use Topics   Alcohol use: No    Alcohol/week: 0.0 standard drinks     Current Outpatient Medications:    aspirin 81 MG EC tablet, Take 81  mg by mouth daily., Disp: , Rfl: 5   atorvastatin (LIPITOR) 10 MG tablet, Take 1 tablet (10 mg total) by mouth daily at 6 PM., Disp: 90 tablet, Rfl: 1   benzonatate (TESSALON) 100 MG capsule, Take 2 capsules (200 mg total) by mouth every 8 (eight) hours., Disp: 21 capsule, Rfl: 0   blood glucose meter kit and supplies, Dispense based on patient and insurance preference. Use up to four times daily as directed. (FOR ICD-10 E10.9, E11.9)., Disp: 1 each, Rfl: 0   docusate sodium (COLACE) 100 MG capsule, Take 100 mg by mouth daily., Disp: , Rfl:    ibuprofen (ADVIL) 600 MG tablet, Take 1 tablet (600 mg total) by mouth every 6 (six) hours as needed., Disp: 30 tablet, Rfl: 0   Insulin Lispro Prot & Lispro (HUMALOG MIX 75/25 KWIKPEN) (75-25) 100 UNIT/ML Kwikpen, INJECT INTO THE SKIN 2 (TWO) TIMES DAILY WITH A MEAL. 30 UNITS IN AM AND 25 UNITS BEFORE DINNER,  Disp: 15 mL, Rfl: 0   Insulin Pen Needle 32G X 6 MM MISC, , Disp: , Rfl:    ipratropium (ATROVENT) 0.06 % nasal spray, Place 2 sprays into both nostrils 4 (four) times daily., Disp: 15 mL, Rfl: 12   Lancets (ONETOUCH DELICA PLUS NTIRWE31V) MISC, , Disp: , Rfl:    losartan (COZAAR) 100 MG tablet, Take 1 tablet (100 mg total) by mouth daily., Disp: 90 tablet, Rfl: 1   metoprolol tartrate (LOPRESSOR) 25 MG tablet, Take 1 tablet (25 mg total) by mouth 2 (two) times daily., Disp: 180 tablet, Rfl: 1   NOVOFINE 32G X 6 MM MISC, USE AS DIRECTED, Disp: 100 each, Rfl: 5   ONETOUCH ULTRA test strip, TEST UP TO 4 TIMES A DAY, Disp: 100 strip, Rfl: 0   pregabalin (LYRICA) 150 MG capsule, Take 1 capsule (150 mg total) by mouth 2 (two) times daily. (Patient taking differently: Take 150 mg by mouth at bedtime.), Disp: 180 capsule, Rfl: 1   promethazine-dextromethorphan (PROMETHAZINE-DM) 6.25-15 MG/5ML syrup, Take 5 mLs by mouth 4 (four) times daily as needed., Disp: 118 mL, Rfl: 0   Semaglutide,0.25 or 0.5MG/DOS, (OZEMPIC, 0.25 OR 0.5 MG/DOSE,) 2 MG/1.5ML SOPN, Inject 0.5 mg into the skin once a week., Disp: 1.5 mL, Rfl: 2   tiZANidine (ZANAFLEX) 2 MG tablet, TAKE 1 TABLET BY MOUTH EVERY EVENING., Disp: 90 tablet, Rfl: 0  No Known Allergies  I personally reviewed active problem list, medication list, allergies, family history, social history, health maintenance with the patient/caregiver today.   ROS  Constitutional: Negative for fever, positive for  weight change.  Respiratory: Negative for cough and shortness of breath.   Cardiovascular: Negative for chest pain or palpitations.  Gastrointestinal: Negative for abdominal pain, no bowel changes.  Musculoskeletal: positive or gait problem but no  joint swelling.  Skin: Negative for rash.  Neurological: Negative for dizziness or headache.  No other specific complaints in a complete review of systems (except as listed in HPI above).   Objective  Vitals:    11/14/20 1104  BP: 136/70  Pulse: 86  Resp: 16  Temp: 97.8 F (36.6 C)  TempSrc: Oral  SpO2: 98%  Weight: 171 lb (77.6 kg)  Height: 5' 8"  (1.727 m)    Body mass index is 26 kg/m.  Physical Exam  Constitutional: Patient appears thinner than usual with temporal waisting  No distress.  HEENT: head atraumatic, normocephalic, neck supple Cardiovascular: Normal rate, regular rhythm and normal heart sounds.  No murmur heard. No  BLE edema. Pulmonary/Chest: Effort normal and breath sounds normal. No respiratory distress. Abdominal: Soft.  There is no tenderness. Muscular skeletal: using a can Psychiatric: Patient has a normal mood and affect. behavior is normal. Judgment and thought content normal.   Recent Results (from the past 2160 hour(s))  Lipase, blood     Status: None   Collection Time: 08/26/20  3:29 PM  Result Value Ref Range   Lipase 20 11 - 51 U/L    Comment: Performed at [facility], [address] [place], [place] [address]  Comprehensive metabolic panel     Status: Abnormal   Collection Time: 08/26/20  3:29 PM  Result Value Ref Range   Sodium 132 (L) 135 - 145 mmol/L   Potassium 4.2 3.5 - 5.1 mmol/L   Chloride 95 (L) 98 - 111 mmol/L   CO2 25 22 - 32 mmol/L   Glucose, Bld 376 (H) 70 - 99 mg/dL    Comment: Glucose reference range applies only to samples taken after fasting for at least 8 hours.   BUN 20 8 - 23 mg/dL   Creatinine, Ser 1.14 0.61 - 1.24 mg/dL   Calcium 9.0 8.9 - 10.3 mg/dL   Total Protein 8.0 6.5 - 8.1 g/dL   Albumin 3.3 (L) 3.5 - 5.0 g/dL   AST 21 15 - 41 U/L   ALT 28 0 - 44 U/L   Alkaline Phosphatase 78 38 - 126 U/L   Total Bilirubin 1.2 0.3 - 1.2 mg/dL   GFR, Estimated >60 >60 mL/min    Comment: (NOTE) Calculated using the CKD-EPI Creatinine Equation (2021)    Anion gap 12 5 - 15    Comment: Performed at [facility], [address] [place], [place] [address]  CBC     Status: Abnormal   Collection Time: 08/26/20   3:29 PM  Result Value Ref Range   WBC 12.5 (H) 4.0 - 10.5 K/uL   RBC 5.02 4.22 - 5.81 MIL/uL   Hemoglobin 14.3 13.0 - 17.0 g/dL   HCT 43.0 39.0 - 52.0 %   MCV 85.7 80.0 - 100.0 fL   MCH 28.5 26.0 - 34.0 pg   MCHC 33.3 30.0 - 36.0 g/dL   RDW 13.2 11.5 - 15.5 %   Platelets 228 150 - 400 K/uL   nRBC 0.0 0.0 - 0.2 %    Comment: Performed at [facility], [address] [place], [place] [address]  TSH     Status: None   Collection Time: 08/26/20  3:29 PM  Result Value Ref Range   TSH 2.356 0.350 - 4.500 uIU/mL    Comment: Performed by a 3rd Generation assay with a functional sensitivity of <=0.01 uIU/mL. Performed at [facility], [address] [place], [place] [address]   Urinalysis, Complete w Microscopic     Status: Abnormal   Collection Time: 08/26/20  4:49 PM  Result Value Ref Range   Color, Urine YELLOW (A) YELLOW   APPearance CLEAR (A) CLEAR   Specific Gravity, Urine >1.046 (H) 1.005 - 1.030   pH 5.0 5.0 - 8.0   Glucose, UA >=500 (A) NEGATIVE mg/dL   Hgb urine dipstick MODERATE (A) NEGATIVE   Bilirubin Urine NEGATIVE NEGATIVE   Ketones, ur 20 (A) NEGATIVE mg/dL   Protein, ur 30 (A) NEGATIVE mg/dL   Nitrite NEGATIVE NEGATIVE   Leukocytes,Ua NEGATIVE NEGATIVE   RBC / HPF 0-5 0 - 5 RBC/hpf   WBC, UA 0-5 0 - 5  WBC/hpf   Bacteria, UA NONE SEEN NONE SEEN   Squamous Epithelial / LPF NONE SEEN 0 - 5   Mucus PRESENT     Comment: Performed at [facility], [address] [place], [place] [address]  Resp Panel by RT-PCR (Flu A&B, Covid) Nasopharyngeal Swab     Status: None   Collection Time: 08/26/20  4:49 PM   Specimen: Nasopharyngeal Swab; Nasopharyngeal(NP) swabs in vial transport medium  Result Value Ref Range   SARS Coronavirus 2 by RT PCR NEGATIVE NEGATIVE    Comment: (NOTE) SARS-CoV-2 target nucleic acids are NOT DETECTED.  The SARS-CoV-2 RNA is generally detectable in upper respiratory specimens during the acute phase of infection. The  lowest concentration of SARS-CoV-2 viral copies this assay can detect is 138 copies/mL. A negative result does not preclude SARS-Cov-2 infection and should not be used as the sole basis for treatment or other patient management decisions. A negative result may occur with  improper specimen collection/handling, submission of specimen other than nasopharyngeal swab, presence of viral mutation(s) within the areas targeted by this assay, and inadequate number of viral copies(<138 copies/mL). A negative result must be combined with clinical observations, patient history, and epidemiological information. The expected result is Negative.  Fact Sheet for Patients:  EntrepreneurPulse.com.au  Fact Sheet for Healthcare Providers:  IncredibleEmployment.be  This test is no t yet approved or cleared by the [place] FDA and  has been authorized for detection and/or diagnosis of SARS-CoV-2 by FDA under an Emergency Use Authorization (EUA). This EUA will remain  in effect (meaning this test can be used) for the duration of the COVID-19 declaration under Section 564(b)(1) of the Act, 21 U.S.C.section 360bbb-3(b)(1), unless the authorization is terminated  or revoked sooner.       Influenza A by PCR NEGATIVE NEGATIVE   Influenza B by PCR NEGATIVE NEGATIVE    Comment: (NOTE) The Xpert Xpress SARS-CoV-2/FLU/RSV plus assay is intended as an aid in the diagnosis of influenza from Nasopharyngeal swab specimens and should not be used as a sole basis for treatment. Nasal washings and aspirates are unacceptable for Xpert Xpress SARS-CoV-2/FLU/RSV testing.  Fact Sheet for Patients: EntrepreneurPulse.com.au  Fact Sheet for Healthcare Providers: IncredibleEmployment.be  This test is not yet approved or cleared by the [place] FDA and has been authorized for detection and/or diagnosis of SARS-CoV-2 by FDA under an Emergency  Use Authorization (EUA). This EUA will remain in effect (meaning this test can be used) for the duration of the COVID-19 declaration under Section 564(b)(1) of the Act, 21 U.S.C. section 360bbb-3(b)(1), unless the authorization is terminated or revoked.  Performed at [facility], [address] [place], [place] [address]   Lactic acid, plasma     Status: None   Collection Time: 08/26/20  8:57 PM  Result Value Ref Range   Lactic Acid, Venous 1.9 0.5 - 1.9 mmol/L    Comment: Performed at [facility], [address] [place],  [address]  Glucose, capillary     Status: Abnormal   Collection Time: 08/26/20  9:15 PM  Result Value Ref Range   Glucose-Capillary 317 (H) 70 - 99 mg/dL    Comment: Glucose reference range applies only to samples taken after fasting for at least 8 hours.   Comment 1 Notify RN   Magnesium     Status: None   Collection Time: 08/27/20  1:01 AM  Result Value Ref Range   Magnesium 1.8 1.7 - 2.4 mg/dL  Comment: Performed at [facility], [address] [place], [place] [address]  CBC WITH DIFFERENTIAL     Status: Abnormal   Collection Time: 08/27/20  1:01 AM  Result Value Ref Range   WBC 13.2 (H) 4.0 - 10.5 K/uL   RBC 4.82 4.22 - 5.81 MIL/uL   Hemoglobin 14.2 13.0 - 17.0 g/dL   HCT 40.7 39.0 - 52.0 %   MCV 84.4 80.0 - 100.0 fL   MCH 29.5 26.0 - 34.0 pg   MCHC 34.9 30.0 - 36.0 g/dL   RDW 13.2 11.5 - 15.5 %   Platelets 214 150 - 400 K/uL   nRBC 0.0 0.0 - 0.2 %   Neutrophils Relative % 86 %   Neutro Abs 11.2 (H) 1.7 - 7.7 K/uL   Lymphocytes Relative 5 %   Lymphs Abs 0.7 0.7 - 4.0 K/uL   Monocytes Relative 9 %   Monocytes Absolute 1.2 (H) 0.1 - 1.0 K/uL   Eosinophils Relative 0 %   Eosinophils Absolute 0.0 0.0 - 0.5 K/uL   Basophils Relative 0 %   Basophils Absolute 0.0 0.0 - 0.1 K/uL   Immature Granulocytes 0 %   Abs Immature Granulocytes 0.04 0.00 - 0.07 K/uL    Comment: Performed at [facility], [address] [place], [place] [address]  Protime-INR     Status: None   Collection Time: 08/27/20  1:01 AM  Result Value Ref Range   Prothrombin Time 15.2 11.4 - 15.2 seconds   INR 1.2 0.8 - 1.2    Comment: (NOTE) INR goal varies based on device and disease states. Performed at [facility], [address] [place], [place] [address]   Hemoglobin A1c     Status: Abnormal   Collection Time: 08/27/20  1:01 AM  Result Value Ref Range   Hgb A1c MFr Bld 10.5 (H) 4.8 - 5.6 %    Comment: (NOTE) Pre diabetes:          5.7%-6.4%  Diabetes:              >6.4%  Glycemic control for   <7.0% adults with diabetes    Mean Plasma Glucose 254.65 mg/dL    Comment: Performed at [facility] [address], [place], [place] [address]  Comprehensive metabolic panel     Status: Abnormal   Collection Time: 08/27/20  1:01 AM  Result Value Ref Range   Sodium 137 135 - 145 mmol/L   Potassium 4.0 3.5 - 5.1 mmol/L   Chloride 100 98 - 111 mmol/L   CO2 26 22 - 32 mmol/L   Glucose, Bld 263 (H) 70 - 99 mg/dL    Comment: Glucose reference range applies only to samples taken after fasting for at least 8 hours.   BUN 19 8 - 23 mg/dL   Creatinine, Ser 1.12 0.61 - 1.24 mg/dL   Calcium 8.8 (L) 8.9 - 10.3 mg/dL   Total Protein 7.6 6.5 - 8.1 g/dL   Albumin 2.9 (L) 3.5 - 5.0 g/dL   AST 20 15 - 41 U/L   ALT 27 0 - 44 U/L   Alkaline Phosphatase 76 38 - 126 U/L   Total Bilirubin 1.2 0.3 - 1.2 mg/dL   GFR, Estimated >60 >60 mL/min    Comment: (NOTE) Calculated using the CKD-EPI Creatinine Equation (2021)    Anion gap 11 5 - 15    Comment: Performed at [facility], [address] [place],  [address]  Lipid panel     Status: None   Collection Time: 08/27/20  1:01 AM  Result Value Ref Range   Cholesterol 135 0 - 200 mg/dL   Triglycerides 62 <150 mg/dL   HDL 49 >40 mg/dL   Total CHOL/HDL Ratio 2.8 RATIO   VLDL 12 0 - 40 mg/dL   LDL Cholesterol 74 0 - 99 mg/dL    Comment:         Total Cholesterol/HDL:CHD Risk Coronary Heart Disease Risk Table                     Men   Women  1/2 Average Risk   3.4   3.3  Average Risk       5.0   4.4  2 X Average Risk   9.6   7.1  3 X Average Risk  23.4   11.0        Use the calculated Patient Ratio above and the CHD Risk Table to determine the patient's CHD Risk.        ATP III CLASSIFICATION (LDL):  <100     mg/dL   Optimal  100-129  mg/dL   Near or Above                    Optimal  130-159  mg/dL   Borderline  160-189  mg/dL   High  >190     mg/dL   Very High Performed at [facility], [place], [place] [address]   Lactic acid, plasma     Status: None   Collection Time: 08/27/20  1:01 AM  Result Value Ref Range   Lactic Acid, Venous 1.7 0.5 - 1.9 mmol/L    Comment: Performed at [facility], [address] [place], [place] [address]  Glucose, capillary     Status: Abnormal   Collection Time: 08/27/20  1:19 AM  Result Value Ref Range   Glucose-Capillary 227 (H) 70 - 99 mg/dL    Comment: Glucose reference range applies only to samples taken after fasting for at least 8 hours.  Glucose, capillary     Status: Abnormal   Collection Time: 08/27/20  6:08 AM  Result Value Ref Range   Glucose-Capillary 178 (H) 70 - 99 mg/dL    Comment: Glucose reference range applies only to samples taken after fasting for at least 8 hours.   Comment 1 Notify RN   Glucose, capillary     Status: Abnormal   Collection Time: 08/27/20  8:12 AM  Result Value Ref Range   Glucose-Capillary 207 (H) 70 - 99 mg/dL    Comment: Glucose reference range applies only to samples taken after fasting for at least 8 hours.  Glucose, capillary     Status: Abnormal   Collection Time: 08/27/20 11:22 AM  Result Value Ref Range   Glucose-Capillary 210 (H) 70 - 99 mg/dL    Comment: Glucose reference range applies only to samples taken after fasting for at least 8 hours.  Aerobic/Anaerobic Culture (surgical/deep wound)      Status: None   Collection Time: 08/27/20  3:54 PM   Specimen: Abdomen; Abscess  Result Value Ref Range   Specimen Description      ABDOMEN Performed at [facility], [address], [place], [place] [address]    Special Requests      NONE Performed at [facility], [address] [place], [place] [address]    Gram Stain  MODERATE WBC PRESENT, PREDOMINANTLY PMN ABUNDANT GRAM POSITIVE COCCI ABUNDANT GRAM POSITIVE RODS    Culture      RARE ESCHERICHIA COLI MODERATE STREPTOCOCCUS ANGINOSIS NO ANAEROBES ISOLATED Performed at [facility], [address] [address], [place], [place] [address]    Report Status 09/01/2020 FINAL    Organism ID, Bacteria ESCHERICHIA COLI    Organism ID, Bacteria STREPTOCOCCUS ANGINOSIS       Susceptibility   Escherichia coli - MIC*    AMPICILLIN >=32 RESISTANT Resistant     CEFAZOLIN <=4 SENSITIVE Sensitive     CEFEPIME <=0.12 SENSITIVE Sensitive     CEFTAZIDIME <=1 SENSITIVE Sensitive     CEFTRIAXONE <=0.25 SENSITIVE Sensitive     CIPROFLOXACIN <=0.25 SENSITIVE Sensitive     GENTAMICIN <=1 SENSITIVE Sensitive     IMIPENEM <=0.25 SENSITIVE Sensitive     TRIMETH/SULFA <=20 SENSITIVE Sensitive     AMPICILLIN/SULBACTAM 16 INTERMEDIATE Intermediate     PIP/TAZO <=4 SENSITIVE Sensitive     * RARE ESCHERICHIA COLI   Streptococcus anginosis - MIC*    PENICILLIN 0.12 SENSITIVE Sensitive     CEFTRIAXONE 0.5 SENSITIVE Sensitive     ERYTHROMYCIN 2 RESISTANT Resistant     LEVOFLOXACIN 0.5 SENSITIVE Sensitive     VANCOMYCIN 0.5 SENSITIVE Sensitive     * MODERATE STREPTOCOCCUS ANGINOSIS  Glucose, capillary     Status: Abnormal   Collection Time: 08/27/20  5:36 PM  Result Value Ref Range   Glucose-Capillary 182 (H) 70 - 99 mg/dL    Comment: Glucose reference range applies only to samples taken after fasting for at least 8 hours.  Culture, blood (routine x 2)     Status: None   Collection Time: 08/27/20  6:10 PM   Specimen: BLOOD   Result Value Ref Range   Specimen Description BLOOD BLOOD LEFT HAND    Special Requests      BOTTLES DRAWN AEROBIC AND ANAEROBIC Blood Culture adequate volume   Culture      NO GROWTH 5 DAYS Performed at [facility], [address] [place], [place] [address]    Report Status 09/01/2020 FINAL   Culture, blood (routine x 2)     Status: None   Collection Time: 08/27/20  6:26 PM   Specimen: BLOOD  Result Value Ref Range   Specimen Description BLOOD LEFT ANTECUBITAL    Special Requests      BOTTLES DRAWN AEROBIC AND ANAEROBIC Blood Culture adequate volume   Culture      NO GROWTH 5 DAYS Performed at [facility], [address] [place], [place] [address]    Report Status 09/01/2020 FINAL   CBC     Status: None   Collection Time: 08/27/20  6:26 PM  Result Value Ref Range   WBC 10.1 4.0 - 10.5 K/uL   RBC 4.87 4.22 - 5.81 MIL/uL   Hemoglobin 14.2 13.0 - 17.0 g/dL   HCT 41.5 39.0 - 52.0 %   MCV 85.2 80.0 - 100.0 fL   MCH 29.2 26.0 - 34.0 pg   MCHC 34.2 30.0 - 36.0 g/dL   RDW 13.3 11.5 - 15.5 %   Platelets 208 150 - 400 K/uL   nRBC 0.0 0.0 - 0.2 %    Comment: Performed at [facility], [address] [place], [place] [address]  Basic metabolic panel     Status: Abnormal   Collection Time: 08/27/20  6:26 PM  Result Value Ref Range   Sodium 136 135 -  145 mmol/L   Potassium 3.8 3.5 - 5.1 mmol/L   Chloride 100 98 - 111 mmol/L   CO2 24 22 - 32 mmol/L   Glucose, Bld 189 (H) 70 - 99 mg/dL    Comment: Glucose reference range applies only to samples taken after fasting for at least 8 hours.   BUN 19 8 - 23 mg/dL   Creatinine, Ser 1.07 0.61 - 1.24 mg/dL   Calcium 8.8 (L) 8.9 - 10.3 mg/dL   GFR, Estimated >60 >60 mL/min    Comment: (NOTE) Calculated using the CKD-EPI Creatinine Equation (2021)    Anion gap 12 5 - 15    Comment: Performed at [facility], [address] [place], [place] [address]  Glucose, capillary     Status: Abnormal    Collection Time: 08/27/20  9:01 PM  Result Value Ref Range   Glucose-Capillary 153 (H) 70 - 99 mg/dL    Comment: Glucose reference range applies only to samples taken after fasting for at least 8 hours.  Glucose, capillary     Status: Abnormal   Collection Time: 08/28/20 12:11 AM  Result Value Ref Range   Glucose-Capillary 245 (H) 70 - 99 mg/dL    Comment: Glucose reference range applies only to samples taken after fasting for at least 8 hours.   Comment 1 Notify RN   Glucose, capillary     Status: Abnormal   Collection Time: 08/28/20  4:52 AM  Result Value Ref Range   Glucose-Capillary 225 (H) 70 - 99 mg/dL    Comment: Glucose reference range applies only to samples taken after fasting for at least 8 hours.   Comment 1 Notify RN   CBC     Status: Abnormal   Collection Time: 08/28/20  5:50 AM  Result Value Ref Range   WBC 10.4 4.0 - 10.5 K/uL   RBC 4.46 4.22 - 5.81 MIL/uL   Hemoglobin 12.8 (L) 13.0 - 17.0 g/dL   HCT 37.7 (L) 39.0 - 52.0 %   MCV 84.5 80.0 - 100.0 fL   MCH 28.7 26.0 - 34.0 pg   MCHC 34.0 30.0 - 36.0 g/dL   RDW 13.5 11.5 - 15.5 %   Platelets 195 150 - 400 K/uL   nRBC 0.0 0.0 - 0.2 %    Comment: Performed at [facility], [address], [place], [place] [address]  Basic metabolic panel     Status: Abnormal   Collection Time: 08/28/20  5:50 AM  Result Value Ref Range   Sodium 136 135 - 145 mmol/L   Potassium 3.4 (L) 3.5 - 5.1 mmol/L   Chloride 102 98 - 111 mmol/L   CO2 26 22 - 32 mmol/L   Glucose, Bld 226 (H) 70 - 99 mg/dL    Comment: Glucose reference range applies only to samples taken after fasting for at least 8 hours.   BUN 23 8 - 23 mg/dL   Creatinine, Ser 1.26 (H) 0.61 - 1.24 mg/dL   Calcium 8.3 (L) 8.9 - 10.3 mg/dL   GFR, Estimated 59 (L) >60 mL/min    Comment: (NOTE) Calculated using the CKD-EPI Creatinine Equation (2021)    Anion gap 8 5 - 15    Comment: Performed at [facility], [address] [place], [place] [address]   Glucose, capillary     Status: Abnormal   Collection Time: 08/28/20  8:01 AM  Result Value Ref Range   Glucose-Capillary 148 (H) 70 - 99 mg/dL  Comment: Glucose reference range applies only to samples taken after fasting for at least 8 hours.  Glucose, capillary     Status: Abnormal   Collection Time: 08/28/20 12:10 PM  Result Value Ref Range   Glucose-Capillary 135 (H) 70 - 99 mg/dL    Comment: Glucose reference range applies only to samples taken after fasting for at least 8 hours.  Glucose, capillary     Status: Abnormal   Collection Time: 08/28/20  3:53 PM  Result Value Ref Range   Glucose-Capillary 111 (H) 70 - 99 mg/dL    Comment: Glucose reference range applies only to samples taken after fasting for at least 8 hours.  Glucose, capillary     Status: Abnormal   Collection Time: 08/28/20  8:55 PM  Result Value Ref Range   Glucose-Capillary 116 (H) 70 - 99 mg/dL    Comment: Glucose reference range applies only to samples taken after fasting for at least 8 hours.  Glucose, capillary     Status: Abnormal   Collection Time: 08/28/20 11:48 PM  Result Value Ref Range   Glucose-Capillary 168 (H) 70 - 99 mg/dL    Comment: Glucose reference range applies only to samples taken after fasting for at least 8 hours.  Glucose, capillary     Status: None   Collection Time: 08/29/20  3:38 AM  Result Value Ref Range   Glucose-Capillary 99 70 - 99 mg/dL    Comment: Glucose reference range applies only to samples taken after fasting for at least 8 hours.  CBC with Differential/Platelet     Status: Abnormal   Collection Time: 08/29/20  4:27 AM  Result Value Ref Range   WBC 12.5 (H) 4.0 - 10.5 K/uL   RBC 4.36 4.22 - 5.81 MIL/uL   Hemoglobin 12.9 (L) 13.0 - 17.0 g/dL   HCT 36.7 (L) 39.0 - 52.0 %   MCV 84.2 80.0 - 100.0 fL   MCH 29.6 26.0 - 34.0 pg   MCHC 35.1 30.0 - 36.0 g/dL   RDW 13.4 11.5 - 15.5 %   Platelets 207 150 - 400 K/uL   nRBC 0.0 0.0 - 0.2 %   Neutrophils Relative % 80 %    Neutro Abs 9.9 (H) 1.7 - 7.7 K/uL   Lymphocytes Relative 10 %   Lymphs Abs 1.3 0.7 - 4.0 K/uL   Monocytes Relative 8 %   Monocytes Absolute 1.0 0.1 - 1.0 K/uL   Eosinophils Relative 1 %   Eosinophils Absolute 0.1 0.0 - 0.5 K/uL   Basophils Relative 0 %   Basophils Absolute 0.0 0.0 - 0.1 K/uL   Immature Granulocytes 1 %   Abs Immature Granulocytes 0.08 (H) 0.00 - 0.07 K/uL    Comment: Performed at [facility], [address], [place], [place] [address]  Magnesium     Status: None   Collection Time: 08/29/20  4:27 AM  Result Value Ref Range   Magnesium 1.7 1.7 - 2.4 mg/dL    Comment: Performed at [facility], [address] [place], [place] [address]  Basic metabolic panel     Status: Abnormal   Collection Time: 08/29/20  4:27 AM  Result Value Ref Range   Sodium 138 135 - 145 mmol/L   Potassium 3.2 (L) 3.5 - 5.1 mmol/L   Chloride 103 98 - 111 mmol/L   CO2 25 22 - 32 mmol/L   Glucose, Bld 92 70 - 99 mg/dL    Comment: Glucose reference range applies only to  samples taken after fasting for at least 8 hours.   BUN 13 8 - 23 mg/dL   Creatinine, Ser 1.08 0.61 - 1.24 mg/dL   Calcium 8.0 (L) 8.9 - 10.3 mg/dL   GFR, Estimated >60 >60 mL/min    Comment: (NOTE) Calculated using the CKD-EPI Creatinine Equation (2021)    Anion gap 10 5 - 15    Comment: Performed at [facility], [address] [place], [place] [address]  Glucose, capillary     Status: Abnormal   Collection Time: 08/29/20  8:16 AM  Result Value Ref Range   Glucose-Capillary 110 (H) 70 - 99 mg/dL    Comment: Glucose reference range applies only to samples taken after fasting for at least 8 hours.  Glucose, capillary     Status: Abnormal   Collection Time: 08/29/20 11:26 AM  Result Value Ref Range   Glucose-Capillary 120 (H) 70 - 99 mg/dL    Comment: Glucose reference range applies only to samples taken after fasting for at least 8 hours.   Comment 1 Notify RN   Glucose, capillary      Status: Abnormal   Collection Time: 08/29/20  4:03 PM  Result Value Ref Range   Glucose-Capillary 127 (H) 70 - 99 mg/dL    Comment: Glucose reference range applies only to samples taken after fasting for at least 8 hours.  Glucose, capillary     Status: None   Collection Time: 08/29/20  9:10 PM  Result Value Ref Range   Glucose-Capillary 88 70 - 99 mg/dL    Comment: Glucose reference range applies only to samples taken after fasting for at least 8 hours.   Comment 1 Notify RN   CBC     Status: Abnormal   Collection Time: 08/30/20  4:21 AM  Result Value Ref Range   WBC 10.7 (H) 4.0 - 10.5 K/uL   RBC 4.46 4.22 - 5.81 MIL/uL   Hemoglobin 12.7 (L) 13.0 - 17.0 g/dL   HCT 37.2 (L) 39.0 - 52.0 %   MCV 83.4 80.0 - 100.0 fL   MCH 28.5 26.0 - 34.0 pg   MCHC 34.1 30.0 - 36.0 g/dL   RDW 13.6 11.5 - 15.5 %   Platelets 224 150 - 400 K/uL   nRBC 0.0 0.0 - 0.2 %    Comment: Performed at [facility], [address], [place], [place] [address]  Basic metabolic panel     Status: Abnormal   Collection Time: 08/30/20  4:21 AM  Result Value Ref Range   Sodium 138 135 - 145 mmol/L   Potassium 3.3 (L) 3.5 - 5.1 mmol/L   Chloride 103 98 - 111 mmol/L   CO2 25 22 - 32 mmol/L   Glucose, Bld 82 70 - 99 mg/dL    Comment: Glucose reference range applies only to samples taken after fasting for at least 8 hours.   BUN 12 8 - 23 mg/dL   Creatinine, Ser 0.89 0.61 - 1.24 mg/dL   Calcium 8.1 (L) 8.9 - 10.3 mg/dL   GFR, Estimated >60 >60 mL/min    Comment: (NOTE) Calculated using the CKD-EPI Creatinine Equation (2021)    Anion gap 10 5 - 15    Comment: Performed at [facility], [address] [place], [place] [address]  Magnesium     Status: None   Collection Time: 08/30/20  4:21 AM  Result Value Ref Range   Magnesium 1.8 1.7 - 2.4 mg/dL    Comment: Performed  at [facility], [address] [place], [place] [address]  Glucose, capillary     Status: None   Collection Time:  08/30/20  8:14 AM  Result Value Ref Range   Glucose-Capillary 86 70 - 99 mg/dL    Comment: Glucose reference range applies only to samples taken after fasting for at least 8 hours.  Glucose, capillary     Status: None   Collection Time: 08/30/20 11:38 AM  Result Value Ref Range   Glucose-Capillary 79 70 - 99 mg/dL    Comment: Glucose reference range applies only to samples taken after fasting for at least 8 hours.  Glucose, capillary     Status: None   Collection Time: 08/30/20  4:28 PM  Result Value Ref Range   Glucose-Capillary 70 70 - 99 mg/dL    Comment: Glucose reference range applies only to samples taken after fasting for at least 8 hours.  Glucose, capillary     Status: Abnormal   Collection Time: 08/30/20  9:37 PM  Result Value Ref Range   Glucose-Capillary 106 (H) 70 - 99 mg/dL    Comment: Glucose reference range applies only to samples taken after fasting for at least 8 hours.   Comment 1 Notify RN   Magnesium     Status: None   Collection Time: 08/31/20  4:30 AM  Result Value Ref Range   Magnesium 1.9 1.7 - 2.4 mg/dL    Comment: Performed at [facility], [address] [place], [place] [address]  Glucose, capillary     Status: Abnormal   Collection Time: 08/31/20  6:10 AM  Result Value Ref Range   Glucose-Capillary 67 (L) 70 - 99 mg/dL    Comment: Glucose reference range applies only to samples taken after fasting for at least 8 hours.  Glucose, capillary     Status: None   Collection Time: 08/31/20  6:41 AM  Result Value Ref Range   Glucose-Capillary 91 70 - 99 mg/dL    Comment: Glucose reference range applies only to samples taken after fasting for at least 8 hours.  Glucose, capillary     Status: Abnormal   Collection Time: 08/31/20  7:57 AM  Result Value Ref Range   Glucose-Capillary 129 (H) 70 - 99 mg/dL    Comment: Glucose reference range applies only to samples taken after fasting for at least 8 hours.   Comment 1 Notify RN   Glucose, capillary      Status: None   Collection Time: 08/31/20 11:50 AM  Result Value Ref Range   Glucose-Capillary 91 70 - 99 mg/dL    Comment: Glucose reference range applies only to samples taken after fasting for at least 8 hours.   Comment 1 Notify RN   Glucose, capillary     Status: None   Collection Time: 08/31/20  4:35 PM  Result Value Ref Range   Glucose-Capillary 82 70 - 99 mg/dL    Comment: Glucose reference range applies only to samples taken after fasting for at least 8 hours.   Comment 1 Notify RN   Glucose, capillary     Status: None   Collection Time: 08/31/20  9:24 PM  Result Value Ref Range   Glucose-Capillary 87 70 - 99 mg/dL    Comment: Glucose reference range applies only to samples taken after fasting for at least 8 hours.  Glucose, capillary     Status: Abnormal   Collection Time: 08/31/20 11:31 PM  Result Value Ref Range  Glucose-Capillary 104 (H) 70 - 99 mg/dL    Comment: Glucose reference range applies only to samples taken after fasting for at least 8 hours.  Magnesium     Status: None   Collection Time: 09/01/20  3:41 AM  Result Value Ref Range   Magnesium 2.2 1.7 - 2.4 mg/dL    Comment: Performed at [facility], [address] [place], [place] [address]  CBC     Status: Abnormal   Collection Time: 09/01/20  3:41 AM  Result Value Ref Range   WBC 12.4 (H) 4.0 - 10.5 K/uL   RBC 5.27 4.22 - 5.81 MIL/uL   Hemoglobin 14.9 13.0 - 17.0 g/dL   HCT 45.1 39.0 - 52.0 %   MCV 85.6 80.0 - 100.0 fL   MCH 28.3 26.0 - 34.0 pg   MCHC 33.0 30.0 - 36.0 g/dL   RDW 13.8 11.5 - 15.5 %   Platelets 353 150 - 400 K/uL   nRBC 0.0 0.0 - 0.2 %    Comment: Performed at [facility], [address] [place], [place] [address]  Comprehensive metabolic panel     Status: Abnormal   Collection Time: 09/01/20  3:41 AM  Result Value Ref Range   Sodium 140 135 - 145 mmol/L   Potassium 3.4 (L) 3.5 - 5.1 mmol/L   Chloride 101 98 - 111 mmol/L   CO2 20 (L) 22 - 32 mmol/L    Glucose, Bld 130 (H) 70 - 99 mg/dL    Comment: Glucose reference range applies only to samples taken after fasting for at least 8 hours.   BUN 13 8 - 23 mg/dL   Creatinine, Ser 1.05 0.61 - 1.24 mg/dL   Calcium 8.9 8.9 - 10.3 mg/dL   Total Protein 7.8 6.5 - 8.1 g/dL   Albumin 2.7 (L) 3.5 - 5.0 g/dL   AST 43 (H) 15 - 41 U/L   ALT 38 0 - 44 U/L   Alkaline Phosphatase 84 38 - 126 U/L   Total Bilirubin 1.6 (H) 0.3 - 1.2 mg/dL   GFR, Estimated >60 >60 mL/min    Comment: (NOTE) Calculated using the CKD-EPI Creatinine Equation (2021)    Anion gap 19 (H) 5 - 15    Comment: Performed at [facility], [address] [place],  [address]  Glucose, capillary     Status: Abnormal   Collection Time: 09/01/20  7:40 AM  Result Value Ref Range   Glucose-Capillary 185 (H) 70 - 99 mg/dL    Comment: Glucose reference range applies only to samples taken after fasting for at least 8 hours.  Glucose, capillary     Status: Abnormal   Collection Time: 09/01/20 12:21 PM  Result Value Ref Range   Glucose-Capillary 174 (H) 70 - 99 mg/dL    Comment: Glucose reference range applies only to samples taken after fasting for at least 8 hours.  Glucose, capillary     Status: Abnormal   Collection Time: 09/01/20  1:26 PM  Result Value Ref Range   Glucose-Capillary 182 (H) 70 - 99 mg/dL    Comment: Glucose reference range applies only to samples taken after fasting for at least 8 hours.  Surgical pathology     Status: None   Collection Time: 09/01/20  6:48 PM  Result Value Ref Range   SURGICAL PATHOLOGY      SURGICAL PATHOLOGY CASE: [id number] PATIENT: [name] Surgical Pathology Report     Specimen Submitted: A. Colon colectomy, right  Clinical History: Abdominal pain      DIAGNOSIS: A. COLON, RIGHT HEMICOLECTOMY: - PERFORATED ACUTE APPENDICITIS WITH ABSCESS AND DENSE SEROSAL ADHESIONS. - FOUR BENIGN LYMPH NODES. - NEGATIVE FOR MALIGNANCY.   GROSS DESCRIPTION: A.  Labeled: Right colectomy Received: Formalin Collection time: 6:50 PM on 09/01/2020 Placed into formalin time: 6:55 PM on 09/01/2020 Specimens received: Terminal ileum, appendix, and ascending colon Measurements: Terminal ileum: 27 cm long by 3.2 cm in circumference; appendix: 9.5 cm long by 1.5 cm in diameter; ascending colon: 21 cm long by 8.1 cm in greatest circumference Specimen Integrity: Grossly intact Orientation: The specimen is received unoriented; orientation is presumed based on the anatomy present. External surface: The serosa of the terminal ileum, appendix, and proximal asce nding colon is brown-purple and dull.  The terminal ileum and appendix have scattered areas of adherent firm mesentery.  The remaining serosa of the ascending colon is tan to purple, smooth, and glistening. Description: The terminal ileum has tan and folded mucosa.  12.5 cm from the proximal resection margin for a length of 3.5 cm the lumen contains tan gelatinous material.  The wall at this area ranges from 0.1 to 0.5 cm in thickness with scattered areas of hemorrhage and adjacent adherent hemorrhagic mesentery.  The mucosa within this area and the distal to this area is edematous.  The ileocecal valve is grossly unremarkable. The ascending colon has tan, folded, and grossly unremarkable mucosa. The wall thickness is 0.3 cm.  No distinct masses or lesions are grossly identified. The appendix has a focally pinpoint and patent lumen.  There are at least 4 potential defects extending to the serosa or into the adjacent mesentery.  The mucosa is tan-white with scattered area s of hemorrhage and flattening.  The wall is focally thickened up to 0.5 cm and has scattered areas of tan discoloration and hemorrhage.  The surrounding mesoappendix is diffusely firm and hemorrhagic. Lymph nodes: 4 lymph node candidates are identified ranging from 0.3 to 0.6 cm in greatest dimension.  Block Summary: 1 - proximal  resection margin, en face 2 - 3 - distal resection margin, en face, bisected 4 - 5 -representative terminal ileum with gelatinous material      4 - representative hemorrhagic wall and adjacent hemorrhagic mesentery      5 - representative thinned wall 6 - representative edematous terminal ileum mucosa 7 - representative grossly normal ascending colon 8 - one half bisected appendiceal tip with one potential defect 9 - 11 - representative appendiceal cross-sections, submitted from appendiceal orifice to appendiceal tip      9 - 1 potential defect extending into adjacent mesoappendix with adjacent discolored mesoappendix      10 - 1 potentia l defect extending into adjacent mesentery and potentially to the serosa      11 - 1 potential defect extending to serosa with adjacent mesoappendiceal hemorrhage and hemorrhage within the wall 12 - representative appendiceal orifice to cecum, perpendicularly sectioned 13 - representative adherent firm mesentery 14 - 3 lymph node candidate, submitted entirely 15 - 1 lymph node candidate, trisected and submitted entirely  RB 09/03/2020   Final Diagnosis performed by Betsy Pries, MD.   Electronically signed 09/04/2020 10:03:41AM The electronic signature indicates that the named Attending Pathologist has evaluated the specimen Technical component performed at [facility], [address], [place], [place] [address] Lab: [phone] Dir: Rush Farmer, MD, MMM  Professional component performed at [facility], [facility], [place], [place], [place] [address] Lab: [phone] Dir: Baxter Flattery  C. Rubinas, MD   Glucose, capillary     Status: Abnormal   Collection Time: 09/01/20  8:45 PM  Result Value Ref Range   Glucose-Capillary 196 (H) 70 - 99 mg/dL    Comment: Glucose reference range applies only to samples taken after fasting for at least 8 hours.  CBC     Status: Abnormal   Collection Time: 09/02/20  2:52 AM  Result Value Ref  Range   WBC 22.7 (H) 4.0 - 10.5 K/uL   RBC 4.95 4.22 - 5.81 MIL/uL   Hemoglobin 14.0 13.0 - 17.0 g/dL   HCT 43.1 39.0 - 52.0 %   MCV 87.1 80.0 - 100.0 fL   MCH 28.3 26.0 - 34.0 pg   MCHC 32.5 30.0 - 36.0 g/dL   RDW 14.6 11.5 - 15.5 %   Platelets 395 150 - 400 K/uL   nRBC 0.0 0.0 - 0.2 %    Comment: Performed at [facility], [address], [place], [place] [address]  Basic metabolic panel     Status: Abnormal   Collection Time: 09/02/20  2:52 AM  Result Value Ref Range   Sodium 139 135 - 145 mmol/L   Potassium 4.9 3.5 - 5.1 mmol/L   Chloride 105 98 - 111 mmol/L   CO2 19 (L) 22 - 32 mmol/L   Glucose, Bld 278 (H) 70 - 99 mg/dL    Comment: Glucose reference range applies only to samples taken after fasting for at least 8 hours.   BUN 24 (H) 8 - 23 mg/dL   Creatinine, Ser 1.18 0.61 - 1.24 mg/dL   Calcium 7.9 (L) 8.9 - 10.3 mg/dL   GFR, Estimated >60 >60 mL/min    Comment: (NOTE) Calculated using the CKD-EPI Creatinine Equation (2021)    Anion gap 15 5 - 15    Comment: Performed at [facility], [address], [place], [place] [address]  Magnesium     Status: None   Collection Time: 09/02/20  2:52 AM  Result Value Ref Range   Magnesium 2.0 1.7 - 2.4 mg/dL    Comment: Performed at [facility], [address] [place], [place] [address]  Phosphorus     Status: None   Collection Time: 09/02/20  2:52 AM  Result Value Ref Range   Phosphorus 3.9 2.5 - 4.6 mg/dL    Comment: Performed at [facility], [address] [place], [place] [address]  Glucose, capillary     Status: Abnormal   Collection Time: 09/02/20  7:36 AM  Result Value Ref Range   Glucose-Capillary 275 (H) 70 - 99 mg/dL    Comment: Glucose reference range applies only to samples taken after fasting for at least 8 hours.  Glucose, capillary     Status: Abnormal   Collection Time: 09/02/20 11:46 AM  Result Value Ref Range   Glucose-Capillary 252 (H) 70 - 99 mg/dL    Comment:  Glucose reference range applies only to samples taken after fasting for at least 8 hours.   Comment 1 Notify RN   Glucose, capillary     Status: Abnormal   Collection Time: 09/02/20  3:57 PM  Result Value Ref Range   Glucose-Capillary 219 (H) 70 - 99 mg/dL    Comment: Glucose reference range applies only to samples taken after fasting for at least 8 hours.  Glucose, capillary     Status: Abnormal   Collection Time: 09/02/20  8:35 PM  Result Value Ref Range  Glucose-Capillary 241 (H) 70 - 99 mg/dL    Comment: Glucose reference range applies only to samples taken after fasting for at least 8 hours.  Glucose, capillary     Status: Abnormal   Collection Time: 09/02/20 11:22 PM  Result Value Ref Range   Glucose-Capillary 268 (H) 70 - 99 mg/dL    Comment: Glucose reference range applies only to samples taken after fasting for at least 8 hours.  Glucose, capillary     Status: Abnormal   Collection Time: 09/03/20  4:19 AM  Result Value Ref Range   Glucose-Capillary 284 (H) 70 - 99 mg/dL    Comment: Glucose reference range applies only to samples taken after fasting for at least 8 hours.  Comprehensive metabolic panel     Status: Abnormal   Collection Time: 09/03/20  5:13 AM  Result Value Ref Range   Sodium 140 135 - 145 mmol/L   Potassium 4.1 3.5 - 5.1 mmol/L   Chloride 107 98 - 111 mmol/L   CO2 25 22 - 32 mmol/L   Glucose, Bld 336 (H) 70 - 99 mg/dL    Comment: Glucose reference range applies only to samples taken after fasting for at least 8 hours.   BUN 24 (H) 8 - 23 mg/dL   Creatinine, Ser 0.84 0.61 - 1.24 mg/dL   Calcium 7.9 (L) 8.9 - 10.3 mg/dL   Total Protein 6.1 (L) 6.5 - 8.1 g/dL   Albumin 2.1 (L) 3.5 - 5.0 g/dL   AST 22 15 - 41 U/L   ALT 25 0 - 44 U/L   Alkaline Phosphatase 56 38 - 126 U/L   Total Bilirubin 0.6 0.3 - 1.2 mg/dL   GFR, Estimated >60 >60 mL/min    Comment: (NOTE) Calculated using the CKD-EPI Creatinine Equation (2021)    Anion gap 8 5 - 15    Comment:  Performed at [facility], [address] [place], [place] [address]  Prealbumin     Status: Abnormal   Collection Time: 09/03/20  5:13 AM  Result Value Ref Range   Prealbumin 7.5 (L) 18 - 38 mg/dL    Comment: Performed at [facility] [address], [place], [place] [address]  Magnesium     Status: None   Collection Time: 09/03/20  5:13 AM  Result Value Ref Range   Magnesium 2.3 1.7 - 2.4 mg/dL    Comment: Performed at [facility], [address] [place], [place] [address]  Phosphorus     Status: Abnormal   Collection Time: 09/03/20  5:13 AM  Result Value Ref Range   Phosphorus 1.3 (L) 2.5 - 4.6 mg/dL    Comment: Performed at [facility], [address] [place], [place] [address]  Triglycerides     Status: None   Collection Time: 09/03/20  5:13 AM  Result Value Ref Range   Triglycerides 116 <150 mg/dL    Comment: Performed at [facility], [address] [place], [place] [address]  CBC     Status: Abnormal   Collection Time: 09/03/20  5:13 AM  Result Value Ref Range   WBC 16.1 (H) 4.0 - 10.5 K/uL   RBC 3.99 (L) 4.22 - 5.81 MIL/uL   Hemoglobin 11.4 (L) 13.0 - 17.0 g/dL   HCT 34.2 (L) 39.0 - 52.0 %   MCV 85.7 80.0 - 100.0 fL   MCH 28.6 26.0 - 34.0 pg   MCHC 33.3 30.0 - 36.0 g/dL   RDW 14.6  11.5 - 15.5 %   Platelets 352 150 - 400 K/uL   nRBC 0.0 0.0 - 0.2 %    Comment: Performed at [facility], [address] [place], [place] [address]  Differential     Status: Abnormal   Collection Time: 09/03/20  5:13 AM  Result Value Ref Range   Neutrophils Relative % 84 %   Neutro Abs 13.6 (H) 1.7 - 7.7 K/uL   Lymphocytes Relative 7 %   Lymphs Abs 1.1 0.7 - 4.0 K/uL   Monocytes Relative 7 %   Monocytes Absolute 1.2 (H) 0.1 - 1.0 K/uL   Eosinophils Relative 1 %   Eosinophils Absolute 0.1 0.0 - 0.5 K/uL   Basophils Relative 0 %   Basophils Absolute 0.0 0.0 - 0.1 K/uL   Immature Granulocytes 1 %   Abs Immature  Granulocytes 0.14 (H) 0.00 - 0.07 K/uL    Comment: Performed at [facility], [address] [place], [place] [address]  Glucose, capillary     Status: Abnormal   Collection Time: 09/03/20  7:42 AM  Result Value Ref Range   Glucose-Capillary 314 (H) 70 - 99 mg/dL    Comment: Glucose reference range applies only to samples taken after fasting for at least 8 hours.   Comment 1 Notify RN   Glucose, capillary     Status: Abnormal   Collection Time: 09/03/20 11:52 AM  Result Value Ref Range   Glucose-Capillary 260 (H) 70 - 99 mg/dL    Comment: Glucose reference range applies only to samples taken after fasting for at least 8 hours.  Urinalysis, Routine w reflex microscopic     Status: Abnormal   Collection Time: 09/03/20  3:20 PM  Result Value Ref Range   Color, Urine YELLOW (A) YELLOW   APPearance CLEAR (A) CLEAR   Specific Gravity, Urine 1.025 1.005 - 1.030   pH 5.0 5.0 - 8.0   Glucose, UA >=500 (A) NEGATIVE mg/dL   Hgb urine dipstick SMALL (A) NEGATIVE   Bilirubin Urine NEGATIVE NEGATIVE   Ketones, ur NEGATIVE NEGATIVE mg/dL   Protein, ur 30 (A) NEGATIVE mg/dL   Nitrite NEGATIVE NEGATIVE   Leukocytes,Ua NEGATIVE NEGATIVE   RBC / HPF 0-5 0 - 5 RBC/hpf   WBC, UA 0-5 0 - 5 WBC/hpf   Bacteria, UA NONE SEEN NONE SEEN   Squamous Epithelial / LPF NONE SEEN 0 - 5   Mucus PRESENT     Comment: Performed at [facility], [address] [place], [place] [address]  Glucose, capillary     Status: Abnormal   Collection Time: 09/03/20  4:32 PM  Result Value Ref Range   Glucose-Capillary 258 (H) 70 - 99 mg/dL    Comment: Glucose reference range applies only to samples taken after fasting for at least 8 hours.   Comment 1 Notify RN   Glucose, capillary     Status: Abnormal   Collection Time: 09/03/20  7:47 PM  Result Value Ref Range   Glucose-Capillary 287 (H) 70 - 99 mg/dL    Comment: Glucose reference range applies only to samples taken after fasting for at least 8  hours.  Glucose, capillary     Status: Abnormal   Collection Time: 09/03/20 11:50 PM  Result Value Ref Range   Glucose-Capillary 138 (H) 70 - 99 mg/dL    Comment: Glucose reference range applies only to samples taken after fasting for at least 8 hours.  Glucose, capillary     Status:  Abnormal   Collection Time: 09/04/20  4:21 AM  Result Value Ref Range   Glucose-Capillary 129 (H) 70 - 99 mg/dL    Comment: Glucose reference range applies only to samples taken after fasting for at least 8 hours.  Comprehensive metabolic panel     Status: Abnormal   Collection Time: 09/04/20  5:35 AM  Result Value Ref Range   Sodium 144 135 - 145 mmol/L   Potassium 3.2 (L) 3.5 - 5.1 mmol/L   Chloride 107 98 - 111 mmol/L   CO2 27 22 - 32 mmol/L   Glucose, Bld 133 (H) 70 - 99 mg/dL    Comment: Glucose reference range applies only to samples taken after fasting for at least 8 hours.   BUN 18 8 - 23 mg/dL   Creatinine, Ser 0.74 0.61 - 1.24 mg/dL   Calcium 8.0 (L) 8.9 - 10.3 mg/dL   Total Protein 6.3 (L) 6.5 - 8.1 g/dL   Albumin 2.0 (L) 3.5 - 5.0 g/dL   AST 21 15 - 41 U/L   ALT 19 0 - 44 U/L   Alkaline Phosphatase 54 38 - 126 U/L   Total Bilirubin 0.4 0.3 - 1.2 mg/dL   GFR, Estimated >60 >60 mL/min    Comment: (NOTE) Calculated using the CKD-EPI Creatinine Equation (2021)    Anion gap 10 5 - 15    Comment: Performed at [facility], [address], [place], [place] [address]  Magnesium     Status: None   Collection Time: 09/04/20  5:35 AM  Result Value Ref Range   Magnesium 2.1 1.7 - 2.4 mg/dL    Comment: Performed at [facility], [address], [place], [place] [address]  Phosphorus     Status: Abnormal   Collection Time: 09/04/20  5:35 AM  Result Value Ref Range   Phosphorus 2.3 (L) 2.5 - 4.6 mg/dL    Comment: Performed at [facility], [address] [place], [place] [address]  Glucose, capillary     Status: Abnormal   Collection Time: 09/04/20  7:53 AM   Result Value Ref Range   Glucose-Capillary 123 (H) 70 - 99 mg/dL    Comment: Glucose reference range applies only to samples taken after fasting for at least 8 hours.  Glucose, capillary     Status: Abnormal   Collection Time: 09/04/20 11:55 AM  Result Value Ref Range   Glucose-Capillary 191 (H) 70 - 99 mg/dL    Comment: Glucose reference range applies only to samples taken after fasting for at least 8 hours.  Glucose, capillary     Status: Abnormal   Collection Time: 09/04/20  4:13 PM  Result Value Ref Range   Glucose-Capillary 243 (H) 70 - 99 mg/dL    Comment: Glucose reference range applies only to samples taken after fasting for at least 8 hours.   Comment 1 Notify RN   Glucose, capillary     Status: Abnormal   Collection Time: 09/04/20  8:23 PM  Result Value Ref Range   Glucose-Capillary 226 (H) 70 - 99 mg/dL    Comment: Glucose reference range applies only to samples taken after fasting for at least 8 hours.  Glucose, capillary     Status: Abnormal   Collection Time: 09/05/20 12:11 AM  Result Value Ref Range   Glucose-Capillary 187 (H) 70 - 99 mg/dL    Comment: Glucose reference range applies only to samples taken after fasting for at least 8 hours.  Glucose, capillary  Status: Abnormal   Collection Time: 09/05/20  4:06 AM  Result Value Ref Range   Glucose-Capillary 176 (H) 70 - 99 mg/dL    Comment: Glucose reference range applies only to samples taken after fasting for at least 8 hours.  CBC     Status: Abnormal   Collection Time: 09/05/20  4:49 AM  Result Value Ref Range   WBC 8.6 4.0 - 10.5 K/uL   RBC 3.26 (L) 4.22 - 5.81 MIL/uL   Hemoglobin 9.8 (L) 13.0 - 17.0 g/dL   HCT 29.7 (L) 39.0 - 52.0 %   MCV 91.1 80.0 - 100.0 fL   MCH 30.1 26.0 - 34.0 pg   MCHC 33.0 30.0 - 36.0 g/dL   RDW 15.4 11.5 - 15.5 %   Platelets 325 150 - 400 K/uL   nRBC 0.0 0.0 - 0.2 %    Comment: Performed at [facility], [address] [place], [place] [address]  Glucose,  capillary     Status: Abnormal   Collection Time: 09/05/20  7:39 AM  Result Value Ref Range   Glucose-Capillary 184 (H) 70 - 99 mg/dL    Comment: Glucose reference range applies only to samples taken after fasting for at least 8 hours.  Basic metabolic panel     Status: Abnormal   Collection Time: 09/05/20 10:11 AM  Result Value Ref Range   Sodium 140 135 - 145 mmol/L   Potassium 3.6 3.5 - 5.1 mmol/L   Chloride 106 98 - 111 mmol/L   CO2 27 22 - 32 mmol/L   Glucose, Bld 233 (H) 70 - 99 mg/dL    Comment: Glucose reference range applies only to samples taken after fasting for at least 8 hours.   BUN 24 (H) 8 - 23 mg/dL   Creatinine, Ser 0.73 0.61 - 1.24 mg/dL   Calcium 8.1 (L) 8.9 - 10.3 mg/dL   GFR, Estimated >60 >60 mL/min    Comment: (NOTE) Calculated using the CKD-EPI Creatinine Equation (2021)    Anion gap 7 5 - 15    Comment: Performed at [facility], [address] [place], [place] [address]  Phosphorus     Status: Abnormal   Collection Time: 09/05/20 10:11 AM  Result Value Ref Range   Phosphorus 2.4 (L) 2.5 - 4.6 mg/dL    Comment: Performed at [facility], [address] [place], [place] [address]  Glucose, capillary     Status: Abnormal   Collection Time: 09/05/20 11:33 AM  Result Value Ref Range   Glucose-Capillary 208 (H) 70 - 99 mg/dL    Comment: Glucose reference range applies only to samples taken after fasting for at least 8 hours.  Glucose, capillary     Status: Abnormal   Collection Time: 09/05/20  3:18 PM  Result Value Ref Range   Glucose-Capillary 249 (H) 70 - 99 mg/dL    Comment: Glucose reference range applies only to samples taken after fasting for at least 8 hours.  Glucose, capillary     Status: Abnormal   Collection Time: 09/05/20  4:07 PM  Result Value Ref Range   Glucose-Capillary 230 (H) 70 - 99 mg/dL    Comment: Glucose reference range applies only to samples taken after fasting for at least 8 hours.   Comment 1 Notify RN    Glucose, capillary     Status: Abnormal   Collection Time: 09/05/20  7:59 PM  Result Value Ref Range   Glucose-Capillary 217 (H) 70 - 99  mg/dL    Comment: Glucose reference range applies only to samples taken after fasting for at least 8 hours.  Glucose, capillary     Status: Abnormal   Collection Time: 09/05/20 11:50 PM  Result Value Ref Range   Glucose-Capillary 235 (H) 70 - 99 mg/dL    Comment: Glucose reference range applies only to samples taken after fasting for at least 8 hours.  Glucose, capillary     Status: Abnormal   Collection Time: 09/06/20  5:09 AM  Result Value Ref Range   Glucose-Capillary 251 (H) 70 - 99 mg/dL    Comment: Glucose reference range applies only to samples taken after fasting for at least 8 hours.  Basic metabolic panel     Status: Abnormal   Collection Time: 09/06/20  5:18 AM  Result Value Ref Range   Sodium 140 135 - 145 mmol/L   Potassium 3.8 3.5 - 5.1 mmol/L   Chloride 106 98 - 111 mmol/L   CO2 26 22 - 32 mmol/L   Glucose, Bld 256 (H) 70 - 99 mg/dL    Comment: Glucose reference range applies only to samples taken after fasting for at least 8 hours.   BUN 20 8 - 23 mg/dL   Creatinine, Ser 0.79 0.61 - 1.24 mg/dL   Calcium 8.1 (L) 8.9 - 10.3 mg/dL   GFR, Estimated >60 >60 mL/min    Comment: (NOTE) Calculated using the CKD-EPI Creatinine Equation (2021)    Anion gap 8 5 - 15    Comment: Performed at [facility], [address] [place], [place] [address]  Phosphorus     Status: Abnormal   Collection Time: 09/06/20  5:18 AM  Result Value Ref Range   Phosphorus 1.9 (L) 2.5 - 4.6 mg/dL    Comment: Performed at [facility], [address] [place], [place] [address]  Glucose, capillary     Status: Abnormal   Collection Time: 09/06/20  7:52 AM  Result Value Ref Range   Glucose-Capillary 219 (H) 70 - 99 mg/dL    Comment: Glucose reference range applies only to samples taken after fasting for at least 8 hours.  Glucose,  capillary     Status: Abnormal   Collection Time: 09/06/20 11:37 AM  Result Value Ref Range   Glucose-Capillary 245 (H) 70 - 99 mg/dL    Comment: Glucose reference range applies only to samples taken after fasting for at least 8 hours.  Glucose, capillary     Status: Abnormal   Collection Time: 09/06/20  3:56 PM  Result Value Ref Range   Glucose-Capillary 290 (H) 70 - 99 mg/dL    Comment: Glucose reference range applies only to samples taken after fasting for at least 8 hours.  Glucose, capillary     Status: Abnormal   Collection Time: 09/06/20  7:48 PM  Result Value Ref Range   Glucose-Capillary 264 (H) 70 - 99 mg/dL    Comment: Glucose reference range applies only to samples taken after fasting for at least 8 hours.  Glucose, capillary     Status: Abnormal   Collection Time: 09/07/20 12:21 AM  Result Value Ref Range   Glucose-Capillary 238 (H) 70 - 99 mg/dL    Comment: Glucose reference range applies only to samples taken after fasting for at least 8 hours.  Glucose, capillary     Status: Abnormal   Collection Time: 09/07/20  5:05 AM  Result Value Ref Range   Glucose-Capillary 245 (H) 70 - 99 mg/dL  Comment: Glucose reference range applies only to samples taken after fasting for at least 8 hours.  Glucose, capillary     Status: Abnormal   Collection Time: 09/07/20  7:30 AM  Result Value Ref Range   Glucose-Capillary 229 (H) 70 - 99 mg/dL    Comment: Glucose reference range applies only to samples taken after fasting for at least 8 hours.   Comment 1 Notify RN   Basic metabolic panel     Status: Abnormal   Collection Time: 09/07/20  7:55 AM  Result Value Ref Range   Sodium 138 135 - 145 mmol/L   Potassium 4.0 3.5 - 5.1 mmol/L   Chloride 107 98 - 111 mmol/L   CO2 26 22 - 32 mmol/L   Glucose, Bld 231 (H) 70 - 99 mg/dL    Comment: Glucose reference range applies only to samples taken after fasting for at least 8 hours.   BUN 21 8 - 23 mg/dL   Creatinine, Ser 0.72 0.61 - 1.24  mg/dL   Calcium 8.0 (L) 8.9 - 10.3 mg/dL   GFR, Estimated >60 >60 mL/min    Comment: (NOTE) Calculated using the CKD-EPI Creatinine Equation (2021)    Anion gap 5 5 - 15    Comment: Performed at [facility], [address], [place], [place] [address]  Magnesium     Status: None   Collection Time: 09/07/20  7:55 AM  Result Value Ref Range   Magnesium 1.8 1.7 - 2.4 mg/dL    Comment: Performed at [facility], [address], [place], [place] [address]  Phosphorus     Status: Abnormal   Collection Time: 09/07/20  7:55 AM  Result Value Ref Range   Phosphorus 2.4 (L) 2.5 - 4.6 mg/dL    Comment: Performed at [facility], [address] [place], [place] [address]  Glucose, capillary     Status: Abnormal   Collection Time: 09/07/20 11:34 AM  Result Value Ref Range   Glucose-Capillary 211 (H) 70 - 99 mg/dL    Comment: Glucose reference range applies only to samples taken after fasting for at least 8 hours.   Comment 1 Notify RN   Glucose, capillary     Status: Abnormal   Collection Time: 09/07/20  3:39 PM  Result Value Ref Range   Glucose-Capillary 259 (H) 70 - 99 mg/dL    Comment: Glucose reference range applies only to samples taken after fasting for at least 8 hours.   Comment 1 Notify RN   Glucose, capillary     Status: Abnormal   Collection Time: 09/07/20  8:50 PM  Result Value Ref Range   Glucose-Capillary 240 (H) 70 - 99 mg/dL    Comment: Glucose reference range applies only to samples taken after fasting for at least 8 hours.  Glucose, capillary     Status: Abnormal   Collection Time: 09/07/20 11:54 PM  Result Value Ref Range   Glucose-Capillary 227 (H) 70 - 99 mg/dL    Comment: Glucose reference range applies only to samples taken after fasting for at least 8 hours.  Glucose, capillary     Status: Abnormal   Collection Time: 09/08/20  4:54 AM  Result Value Ref Range   Glucose-Capillary 227 (H) 70 - 99 mg/dL    Comment: Glucose reference  range applies only to samples taken after fasting for at least 8 hours.   Comment 1 Notify RN   Comprehensive metabolic panel     Status: Abnormal  Collection Time: 09/08/20  5:06 AM  Result Value Ref Range   Sodium 137 135 - 145 mmol/L   Potassium 4.0 3.5 - 5.1 mmol/L   Chloride 106 98 - 111 mmol/L   CO2 25 22 - 32 mmol/L   Glucose, Bld 212 (H) 70 - 99 mg/dL    Comment: Glucose reference range applies only to samples taken after fasting for at least 8 hours.   BUN 20 8 - 23 mg/dL   Creatinine, Ser 0.80 0.61 - 1.24 mg/dL   Calcium 8.0 (L) 8.9 - 10.3 mg/dL   Total Protein 6.0 (L) 6.5 - 8.1 g/dL   Albumin 2.0 (L) 3.5 - 5.0 g/dL   AST 30 15 - 41 U/L   ALT 14 0 - 44 U/L   Alkaline Phosphatase 43 38 - 126 U/L   Total Bilirubin 0.3 0.3 - 1.2 mg/dL   GFR, Estimated >60 >60 mL/min    Comment: (NOTE) Calculated using the CKD-EPI Creatinine Equation (2021)    Anion gap 6 5 - 15    Comment: Performed at [facility], [address] [place], [place] [address]  Magnesium     Status: None   Collection Time: 09/08/20  5:06 AM  Result Value Ref Range   Magnesium 1.8 1.7 - 2.4 mg/dL    Comment: Performed at [facility], [address] [place], [place] [address]  Phosphorus     Status: None   Collection Time: 09/08/20  5:06 AM  Result Value Ref Range   Phosphorus 2.6 2.5 - 4.6 mg/dL    Comment: Performed at [facility], [address] [place], [place] [address]  CBC     Status: Abnormal   Collection Time: 09/08/20  5:06 AM  Result Value Ref Range   WBC 9.3 4.0 - 10.5 K/uL   RBC 3.46 (L) 4.22 - 5.81 MIL/uL   Hemoglobin 10.1 (L) 13.0 - 17.0 g/dL   HCT 29.9 (L) 39.0 - 52.0 %   MCV 86.4 80.0 - 100.0 fL   MCH 29.2 26.0 - 34.0 pg   MCHC 33.8 30.0 - 36.0 g/dL   RDW 14.7 11.5 - 15.5 %   Platelets 294 150 - 400 K/uL   nRBC 0.0 0.0 - 0.2 %    Comment: Performed at [facility], [address] [place], [place] [address]  Differential     Status:  Abnormal   Collection Time: 09/08/20  5:06 AM  Result Value Ref Range   Neutrophils Relative % 72 %   Neutro Abs 6.7 1.7 - 7.7 K/uL   Lymphocytes Relative 16 %   Lymphs Abs 1.5 0.7 - 4.0 K/uL   Monocytes Relative 8 %   Monocytes Absolute 0.7 0.1 - 1.0 K/uL   Eosinophils Relative 3 %   Eosinophils Absolute 0.3 0.0 - 0.5 K/uL   Basophils Relative 0 %   Basophils Absolute 0.0 0.0 - 0.1 K/uL   Immature Granulocytes 1 %   Abs Immature Granulocytes 0.09 (H) 0.00 - 0.07 K/uL    Comment: Performed at [facility], [address] [place], [place] [address]  Triglycerides     Status: None   Collection Time: 09/08/20  5:06 AM  Result Value Ref Range   Triglycerides 100 <150 mg/dL    Comment: Performed at [facility], [address] [place], [place] [address]  Prealbumin     Status: Abnormal   Collection Time: 09/08/20  5:07 AM  Result Value Ref Range  Prealbumin 16.6 (L) 18 - 38 mg/dL    Comment: Performed at [facility] [address], [place], [place] [address]  Glucose, capillary     Status: Abnormal   Collection Time: 09/08/20  7:23 AM  Result Value Ref Range   Glucose-Capillary 190 (H) 70 - 99 mg/dL    Comment: Glucose reference range applies only to samples taken after fasting for at least 8 hours.  Glucose, capillary     Status: Abnormal   Collection Time: 09/08/20 11:33 AM  Result Value Ref Range   Glucose-Capillary 214 (H) 70 - 99 mg/dL    Comment: Glucose reference range applies only to samples taken after fasting for at least 8 hours.  Glucose, capillary     Status: Abnormal   Collection Time: 09/08/20  4:10 PM  Result Value Ref Range   Glucose-Capillary 238 (H) 70 - 99 mg/dL    Comment: Glucose reference range applies only to samples taken after fasting for at least 8 hours.  Glucose, capillary     Status: Abnormal   Collection Time: 09/08/20  7:52 PM  Result Value Ref Range   Glucose-Capillary 218 (H) 70 - 99 mg/dL    Comment: Glucose  reference range applies only to samples taken after fasting for at least 8 hours.  Glucose, capillary     Status: Abnormal   Collection Time: 09/08/20 11:12 PM  Result Value Ref Range   Glucose-Capillary 196 (H) 70 - 99 mg/dL    Comment: Glucose reference range applies only to samples taken after fasting for at least 8 hours.  Glucose, capillary     Status: Abnormal   Collection Time: 09/09/20  3:57 AM  Result Value Ref Range   Glucose-Capillary 173 (H) 70 - 99 mg/dL    Comment: Glucose reference range applies only to samples taken after fasting for at least 8 hours.  Basic metabolic panel     Status: Abnormal   Collection Time: 09/09/20  4:09 AM  Result Value Ref Range   Sodium 136 135 - 145 mmol/L   Potassium 4.1 3.5 - 5.1 mmol/L   Chloride 105 98 - 111 mmol/L   CO2 25 22 - 32 mmol/L   Glucose, Bld 171 (H) 70 - 99 mg/dL    Comment: Glucose reference range applies only to samples taken after fasting for at least 8 hours.   BUN 20 8 - 23 mg/dL   Creatinine, Ser 0.80 0.61 - 1.24 mg/dL   Calcium 7.9 (L) 8.9 - 10.3 mg/dL   GFR, Estimated >60 >60 mL/min    Comment: (NOTE) Calculated using the CKD-EPI Creatinine Equation (2021)    Anion gap 6 5 - 15    Comment: Performed at [facility], [address] [place], [place] [address]  Magnesium     Status: None   Collection Time: 09/09/20  4:09 AM  Result Value Ref Range   Magnesium 1.9 1.7 - 2.4 mg/dL    Comment: Performed at [facility], [address] [place], [place] [address]  Phosphorus     Status: None   Collection Time: 09/09/20  4:09 AM  Result Value Ref Range   Phosphorus 2.6 2.5 - 4.6 mg/dL    Comment: Performed at [facility], [address] [place], [place] [address]  Glucose, capillary     Status: Abnormal   Collection Time: 09/09/20  8:08 AM  Result Value Ref Range   Glucose-Capillary 167 (H) 70 - 99 mg/dL  Comment: Glucose reference range applies only to samples taken after fasting  for at least 8 hours.  Glucose, capillary     Status: Abnormal   Collection Time: 09/09/20 11:25 AM  Result Value Ref Range   Glucose-Capillary 231 (H) 70 - 99 mg/dL    Comment: Glucose reference range applies only to samples taken after fasting for at least 8 hours.  Glucose, capillary     Status: Abnormal   Collection Time: 09/09/20  4:31 PM  Result Value Ref Range   Glucose-Capillary 210 (H) 70 - 99 mg/dL    Comment: Glucose reference range applies only to samples taken after fasting for at least 8 hours.  Glucose, capillary     Status: Abnormal   Collection Time: 09/09/20  7:47 PM  Result Value Ref Range   Glucose-Capillary 175 (H) 70 - 99 mg/dL    Comment: Glucose reference range applies only to samples taken after fasting for at least 8 hours.  Glucose, capillary     Status: None   Collection Time: 09/09/20 11:32 PM  Result Value Ref Range   Glucose-Capillary 98 70 - 99 mg/dL    Comment: Glucose reference range applies only to samples taken after fasting for at least 8 hours.  Glucose, capillary     Status: Abnormal   Collection Time: 09/10/20  4:50 AM  Result Value Ref Range   Glucose-Capillary 110 (H) 70 - 99 mg/dL    Comment: Glucose reference range applies only to samples taken after fasting for at least 8 hours.  Glucose, capillary     Status: Abnormal   Collection Time: 09/10/20  7:49 AM  Result Value Ref Range   Glucose-Capillary 119 (H) 70 - 99 mg/dL    Comment: Glucose reference range applies only to samples taken after fasting for at least 8 hours.  Glucose, capillary     Status: Abnormal   Collection Time: 09/10/20 11:37 AM  Result Value Ref Range   Glucose-Capillary 163 (H) 70 - 99 mg/dL    Comment: Glucose reference range applies only to samples taken after fasting for at least 8 hours.   Comment 1 Notify RN   Glucose, capillary     Status: Abnormal   Collection Time: 09/10/20  3:49 PM  Result Value Ref Range   Glucose-Capillary 182 (H) 70 - 99 mg/dL     Comment: Glucose reference range applies only to samples taken after fasting for at least 8 hours.   Comment 1 Notify RN   Glucose, capillary     Status: Abnormal   Collection Time: 09/10/20  8:28 PM  Result Value Ref Range   Glucose-Capillary 175 (H) 70 - 99 mg/dL    Comment: Glucose reference range applies only to samples taken after fasting for at least 8 hours.  Glucose, capillary     Status: Abnormal   Collection Time: 09/11/20 12:07 AM  Result Value Ref Range   Glucose-Capillary 137 (H) 70 - 99 mg/dL    Comment: Glucose reference range applies only to samples taken after fasting for at least 8 hours.  Glucose, capillary     Status: Abnormal   Collection Time: 09/11/20  4:23 AM  Result Value Ref Range   Glucose-Capillary 118 (H) 70 - 99 mg/dL    Comment: Glucose reference range applies only to samples taken after fasting for at least 8 hours.  Glucose, capillary     Status: Abnormal   Collection Time: 09/11/20  7:37 AM  Result Value Ref Range  Glucose-Capillary 149 (H) 70 - 99 mg/dL    Comment: Glucose reference range applies only to samples taken after fasting for at least 8 hours.   Comment 1 Notify RN   Glucose, capillary     Status: Abnormal   Collection Time: 09/11/20 11:39 AM  Result Value Ref Range   Glucose-Capillary 196 (H) 70 - 99 mg/dL    Comment: Glucose reference range applies only to samples taken after fasting for at least 8 hours.   Comment 1 Notify RN       PHQ2/9: Depression screen [facility] 2/9 11/14/2020 09/23/2020 08/05/2020 07/16/2020 03/19/2020  Decreased Interest 0 0 0 0 1  Down, Depressed, Hopeless 2 0 0 0 1  PHQ - 2 Score 2 0 0 0 2  Altered sleeping 1 - - - 2  Tired, decreased energy 0 - - - 2  Change in appetite 3 - - - 2  Feeling bad or failure about yourself  3 - - - 0  Trouble concentrating 0 - - - 0  Moving slowly or fidgety/restless 0 - - - 0  Suicidal thoughts 0 - - - 0  PHQ-9 Score 9 - - - 8  Difficult doing work/chores Somewhat difficult -  - - Not difficult at all  Some recent data might be hidden    phq 9 is positive   Fall Risk: Fall Risk  11/14/2020 09/23/2020 08/05/2020 07/16/2020 03/19/2020  Falls in the past year? 0 0 0 0 0  Number falls in past yr: 0 0 0 0 0  Injury with Fall? 0 0 0 0 0  Risk for fall due to : - - No Fall Risks - -  Follow up - Falls evaluation completed Falls prevention discussed - -     Functional Status Survey: Is the patient deaf or have difficulty hearing?: Yes Does the patient have difficulty seeing, even when wearing glasses/contacts?: Yes Does the patient have difficulty concentrating, remembering, or making decisions?: Yes Does the patient have difficulty walking or climbing stairs?: Yes Does the patient have difficulty dressing or bathing?: No Does the patient have difficulty doing errands alone such as visiting a doctor's office or shopping?: No    Assessment & Plan  1. Uncontrolled type 2 diabetes mellitus with hyperglycemia (HCC)  It seems to be getting better, return in 2 months for follow up  2. Dyslipidemia  Recheck labs next visit   3. Atherosclerosis of aorta ([place])  On statin therapy  4. Hypertension associated with type 2 diabetes mellitus (HCC)  Bp at goal today   5. Dysthymia  Refuses medications,he states since his surgery and not able to do things he used to do     6. Moderate protein-calorie malnutrition ([place])  Discussed protein shake   7. Hyperlipidemia associated with type 2 diabetes mellitus ([place])   8. S/P right colectomy   9. Gait difficulty

## 2020-11-14 ENCOUNTER — Encounter: Payer: Self-pay | Admitting: Family Medicine

## 2020-11-14 ENCOUNTER — Other Ambulatory Visit: Payer: Self-pay

## 2020-11-14 ENCOUNTER — Ambulatory Visit (INDEPENDENT_AMBULATORY_CARE_PROVIDER_SITE_OTHER): Payer: Medicare Other | Admitting: Family Medicine

## 2020-11-14 VITALS — BP 136/70 | HR 86 | Temp 97.8°F | Resp 16 | Ht 68.0 in | Wt 171.0 lb

## 2020-11-14 DIAGNOSIS — E1169 Type 2 diabetes mellitus with other specified complication: Secondary | ICD-10-CM

## 2020-11-14 DIAGNOSIS — R269 Unspecified abnormalities of gait and mobility: Secondary | ICD-10-CM

## 2020-11-14 DIAGNOSIS — I700 Atherosclerosis of aorta: Secondary | ICD-10-CM | POA: Diagnosis not present

## 2020-11-14 DIAGNOSIS — F341 Dysthymic disorder: Secondary | ICD-10-CM

## 2020-11-14 DIAGNOSIS — E785 Hyperlipidemia, unspecified: Secondary | ICD-10-CM | POA: Diagnosis not present

## 2020-11-14 DIAGNOSIS — Z9049 Acquired absence of other specified parts of digestive tract: Secondary | ICD-10-CM

## 2020-11-14 DIAGNOSIS — I152 Hypertension secondary to endocrine disorders: Secondary | ICD-10-CM | POA: Diagnosis not present

## 2020-11-14 DIAGNOSIS — E1165 Type 2 diabetes mellitus with hyperglycemia: Secondary | ICD-10-CM | POA: Diagnosis not present

## 2020-11-14 DIAGNOSIS — E1159 Type 2 diabetes mellitus with other circulatory complications: Secondary | ICD-10-CM | POA: Diagnosis not present

## 2020-11-14 DIAGNOSIS — E440 Moderate protein-calorie malnutrition: Secondary | ICD-10-CM

## 2020-12-07 ENCOUNTER — Other Ambulatory Visit: Payer: Self-pay | Admitting: Family Medicine

## 2020-12-07 DIAGNOSIS — M545 Low back pain, unspecified: Secondary | ICD-10-CM

## 2020-12-07 NOTE — Telephone Encounter (Signed)
Requested medication (s) are due for refill today: yes  Requested medication (s) are on the active medication list: yes  Last refill:  09/11/20 #90  Future visit scheduled: yes  Notes to clinic:  med not delegated to NT to RF   Requested Prescriptions  Pending Prescriptions Disp Refills   tiZANidine (ZANAFLEX) 2 MG tablet [Pharmacy Med Name: TIZANIDINE HCL 2 MG TABLET] 90 tablet 0    Sig: TAKE 1 TABLET BY MOUTH EVERY DAY IN THE EVENING     Not Delegated - Cardiovascular:  Alpha-2 Agonists - tizanidine Failed - 12/07/2020 12:53 AM      Failed - This refill cannot be delegated      Passed - Valid encounter within last 6 months    Recent Outpatient Visits           3 weeks ago Uncontrolled type 2 diabetes mellitus with hyperglycemia [facility])   [facility] [place], Danna Hefty, MD   2 months ago Uncontrolled type 2 diabetes mellitus with hyperglycemia [facility])   [facility] Gabriel Cirri, NP   4 months ago Atherosclerosis of aorta [facility])   [facility] Alba Cory, MD   8 months ago Acute neck pain   [facility] [place], Danna Hefty, MD   9 months ago Uncontrolled type 2 diabetes mellitus with hyperglycemia [facility])   [facility] Alba Cory, MD       Future Appointments             In 2 months Carlynn Purl, Danna Hefty, MD [facility]   In 8 months  [facility], [facility]

## 2020-12-09 ENCOUNTER — Other Ambulatory Visit: Payer: Self-pay | Admitting: Family Medicine

## 2020-12-09 DIAGNOSIS — I700 Atherosclerosis of aorta: Secondary | ICD-10-CM

## 2020-12-09 DIAGNOSIS — E785 Hyperlipidemia, unspecified: Secondary | ICD-10-CM

## 2020-12-09 DIAGNOSIS — E1165 Type 2 diabetes mellitus with hyperglycemia: Secondary | ICD-10-CM

## 2020-12-09 DIAGNOSIS — E1169 Type 2 diabetes mellitus with other specified complication: Secondary | ICD-10-CM

## 2020-12-09 NOTE — Telephone Encounter (Signed)
Requested Prescriptions  Pending Prescriptions Disp Refills  . Insulin Lispro Prot & Lispro (HUMALOG MIX 75/25 KWIKPEN) (75-25) 100 UNIT/ML [employer] Med Name: HUMALOG MIX 75-25 KWIKPEN] 15 mL 1    Sig: INJECT INTO THE SKIN 2 (TWO) TIMES DAILY WITH A MEAL. 30 UNITS IN AM AND 25 UNITS BEFORE DINNER     Endocrinology:  Diabetes - Insulins Failed - 12/09/2020  7:09 PM      Failed - HBA1C is between 0 and 7.9 and within 180 days    Hemoglobin A1C  Date Value Ref Range Status  06/30/2020 10.3  Final    Comment:    POC   Hgb A1c MFr Bld  Date Value Ref Range Status  08/27/2020 10.5 (H) 4.8 - 5.6 % Final    Comment:    (NOTE) Pre diabetes:          5.7%-6.4%  Diabetes:              >6.4%  Glycemic control for   <7.0% adults with diabetes          Passed - Valid encounter within last 6 months    Recent Outpatient Visits          3 weeks ago Uncontrolled type 2 diabetes mellitus with hyperglycemia [facility])   [facility] Alba Cory, MD   2 months ago Uncontrolled type 2 diabetes mellitus with hyperglycemia [facility])   [facility] Gabriel Cirri, NP   4 months ago Atherosclerosis of aorta [facility])   [facility] Alba Cory, MD   8 months ago Acute neck pain   [facility] [place], Danna Hefty, MD   9 months ago Uncontrolled type 2 diabetes mellitus with hyperglycemia [facility])   [facility] Alba Cory, MD      Future Appointments            In 2 months Carlynn Purl, Danna Hefty, MD [facility], [facility]   In 8 months  [facility], [facility]

## 2020-12-29 ENCOUNTER — Other Ambulatory Visit: Payer: Self-pay | Admitting: Unknown Physician Specialty

## 2020-12-30 NOTE — Telephone Encounter (Signed)
Requested medication (s) are due for refill today: yes  Requested medication (s) are on the active medication list: yes  Last refill:  09/23/20  Future visit scheduled: yes  Notes to clinic:  overdue labs    Requested Prescriptions  Pending Prescriptions Disp Refills   OZEMPIC, 0.25 OR 0.5 MG/DOSE, 2 MG/1.5ML SOPN [Pharmacy Med Name: OZEMPIC 0.25-0.5 MG/DOSE PEN]  2    Sig: Inject 0.5 mg into the skin once a week.     Endocrinology:  Diabetes - GLP-1 Receptor Agonists Failed - 12/29/2020 10:04 AM      Failed - HBA1C is between 0 and 7.9 and within 180 days    Hemoglobin A1C  Date Value Ref Range Status  06/30/2020 10.3  Final    Comment:    POC   Hgb A1c MFr Bld  Date Value Ref Range Status  08/27/2020 10.5 (H) 4.8 - 5.6 % Final    Comment:    (NOTE) Pre diabetes:          5.7%-6.4%  Diabetes:              >6.4%  Glycemic control for   <7.0% adults with diabetes           Passed - Valid encounter within last 6 months    Recent Outpatient Visits           1 month ago Uncontrolled type 2 diabetes mellitus with hyperglycemia [facility])   [facility] Alba Cory, MD   3 months ago Uncontrolled type 2 diabetes mellitus with hyperglycemia [facility])   [facility] Gabriel Cirri, NP   5 months ago Atherosclerosis of aorta [facility])   [facility] Alba Cory, MD   9 months ago Acute neck pain   [facility], Danna Hefty, MD   10 months ago Uncontrolled type 2 diabetes mellitus with hyperglycemia [facility])   [facility] Alba Cory, MD       Future Appointments             In 1 month Carlynn Purl, Danna Hefty, MD [facility], [facility]   In 7 months  [facility], [facility]

## 2020-12-31 ENCOUNTER — Other Ambulatory Visit: Payer: Self-pay

## 2021-01-16 ENCOUNTER — Telehealth: Payer: Self-pay

## 2021-01-16 ENCOUNTER — Other Ambulatory Visit: Payer: Self-pay

## 2021-01-16 MED ORDER — INSULIN PEN NEEDLE 31G X 6 MM MISC: 1.0000 | Freq: Three times a day (TID) | 1 refills | Status: DC | PRN

## 2021-01-16 NOTE — Telephone Encounter (Signed)
Patient is needing refill on Novofine 32 G NEEDLES sent to [facility] on [address] [place]. H e said tp make sure that all his meds will now be sent to [facility] [address] [facility] [place] [facility] closed.

## 2021-02-18 ENCOUNTER — Encounter: Payer: Self-pay | Admitting: Family Medicine

## 2021-02-18 ENCOUNTER — Ambulatory Visit (INDEPENDENT_AMBULATORY_CARE_PROVIDER_SITE_OTHER): Payer: Medicare Other | Admitting: Family Medicine

## 2021-02-18 ENCOUNTER — Other Ambulatory Visit: Payer: Self-pay

## 2021-02-18 VITALS — BP 132/72 | HR 86 | Temp 98.1°F | Resp 16 | Ht 68.0 in | Wt 179.0 lb

## 2021-02-18 DIAGNOSIS — I10 Essential (primary) hypertension: Secondary | ICD-10-CM

## 2021-02-18 DIAGNOSIS — Z23 Encounter for immunization: Secondary | ICD-10-CM | POA: Diagnosis not present

## 2021-02-18 DIAGNOSIS — I152 Hypertension secondary to endocrine disorders: Secondary | ICD-10-CM | POA: Diagnosis not present

## 2021-02-18 DIAGNOSIS — E559 Vitamin D deficiency, unspecified: Secondary | ICD-10-CM

## 2021-02-18 DIAGNOSIS — M545 Low back pain, unspecified: Secondary | ICD-10-CM

## 2021-02-18 DIAGNOSIS — I700 Atherosclerosis of aorta: Secondary | ICD-10-CM | POA: Diagnosis not present

## 2021-02-18 DIAGNOSIS — E441 Mild protein-calorie malnutrition: Secondary | ICD-10-CM

## 2021-02-18 DIAGNOSIS — E1159 Type 2 diabetes mellitus with other circulatory complications: Secondary | ICD-10-CM | POA: Diagnosis not present

## 2021-02-18 DIAGNOSIS — E1169 Type 2 diabetes mellitus with other specified complication: Secondary | ICD-10-CM

## 2021-02-18 DIAGNOSIS — D508 Other iron deficiency anemias: Secondary | ICD-10-CM | POA: Diagnosis not present

## 2021-02-18 DIAGNOSIS — E785 Hyperlipidemia, unspecified: Secondary | ICD-10-CM | POA: Diagnosis not present

## 2021-02-18 DIAGNOSIS — E1165 Type 2 diabetes mellitus with hyperglycemia: Secondary | ICD-10-CM

## 2021-02-18 LAB — POCT GLYCOSYLATED HEMOGLOBIN (HGB A1C): Hemoglobin A1C: 11.8 % — AB (ref 4.0–5.6)

## 2021-02-18 MED ORDER — PIOGLITAZONE HCL 15 MG PO TABS: 15.0000 mg | ORAL_TABLET | Freq: Every day | ORAL | 1 refills | Status: DC

## 2021-02-18 MED ORDER — ATORVASTATIN CALCIUM 20 MG PO TABS: 20.0000 mg | ORAL_TABLET | Freq: Every day | ORAL | 1 refills | Status: DC

## 2021-02-18 NOTE — Progress Notes (Signed)
Name: William Parks   MRN: [id number]    DOB: [birth year]   Date:02/18/2021       Progress Note  Subjective  Chief Complaint  Follow Up  HPI  DMII: he was seen back in April 2021 and A1C was 12.4 %,  11.1 %  to 9.2 % and back in May  2022 10.5 % today it is 11.8 % . He is now on dose of insulin  75/25 he is still only using 30 in am and 25 in pm, he is also taking Ozempic 0.5 once a week.. He has associated HTN, dyslipidemia and neuropathy. He is taking Lyrica as prescribed. He refuses Metformin/states it caused an yeast infection, but willing to add Actos. Discussed possible side effects of medications. Explained he needs to see Endo but he asked me to give him another chance to follow a diabetic diet and take mediations as prescribed. He denies polyphagia, polydipsia or polyuria  HTN: he is taking losartan 100 daily but states not longer taking Metoprolol. BP is at goal . Denies chest pain or palpitation    Dyslipidemia: he is taking Atorvastatin, last LDL was up a little from 68 to 74 and he is willing to go up on dose of Atorvastatin to 20 mg    Tobacco use: he quit May 4 th, 2022 with the nicotine patch. No cough or wheezing, congratulated him again    Intermittent low back pain: he states doing well lately, Lyrica seems to help    Malnutrition: he states he used to be in the 210 lbs range, went down to 187 lbs gradually started to gain weight, but in May was admitted for rupture appendix and  after that had COVID-19:  He has lack of appetite and weight was down to 171 lbs .Today is up to 179 lbs but still over 10 % down from original weight    Atherosclerosis of aorta: on statin and aspirin daily , last LDL at 74 we will adjust dose of Atorvastatin to 20 mg    Patient Active Problem List   Diagnosis Date Noted   S/P right colectomy 11/14/2020   DM (diabetes mellitus), type 2, uncontrolled 08/26/2020   Atherosclerosis of aorta ([place]) 10/03/2019   Neck and shoulder pain 11/08/2016    Vitamin D deficiency 10/15/2015   Dyslipidemia 06/02/2015   Hypertension 04/01/2015   Uncontrolled type 2 diabetes mellitus with peripheral neuropathy 04/01/2015    Past Surgical History:  Procedure Laterality Date   APPENDECTOMY     CYST REMOVAL TRUNK     2x   LAPAROSCOPY N/A 09/01/2020   Procedure: LAPAROSCOPY DIAGNOSTIC;  Surgeon: Olean Ree, MD;  Location: [facility] ORS;  Service: General;  Laterality: N/A;   PARTIAL COLECTOMY N/A 09/01/2020   Procedure: PARTIAL RIGHT COLECTOMY;  Surgeon: Olean Ree, MD;  Location: [facility] ORS;  Service: General;  Laterality: N/A;   SHOULDER SURGERY Right 2009   rotator cuff surgery    Family History  Problem Relation Age of Onset   Cancer Mother        Breast CA   Diabetes Mother    Hypertension Mother    Diabetes Brother     Social History   Tobacco Use   Smoking status: Former    Packs/day: 0.25    Years: 50.00    Pack years: 12.50    Types: Cigarettes    Start date: 07/27/1967    Quit date: 08/27/2020    Years since quitting: 0.4   Smokeless tobacco: Never  Substance Use Topics   Alcohol use: No    Alcohol/week: 0.0 standard drinks     Current Outpatient Medications:    aspirin 81 MG EC tablet, Take 81 mg by mouth daily., Disp: , Rfl: 5   blood glucose meter kit and supplies, Dispense based on patient and insurance preference. Use up to four times daily as directed. (FOR ICD-10 E10.9, E11.9)., Disp: 1 each, Rfl: 0   docusate sodium (COLACE) 100 MG capsule, Take 100 mg by mouth daily., Disp: , Rfl:    ibuprofen (ADVIL) 600 MG tablet, Take 1 tablet (600 mg total) by mouth every 6 (six) hours as needed., Disp: 30 tablet, Rfl: 0   Insulin Lispro Prot & Lispro (HUMALOG MIX 75/25 KWIKPEN) (75-25) 100 UNIT/ML Kwikpen, INJECT INTO THE SKIN 2 (TWO) TIMES DAILY WITH A MEAL. 30 UNITS IN AM AND 25 UNITS BEFORE DINNER, Disp: 15 mL, Rfl: 0   Insulin Pen Needle 31G X 6 MM MISC, 1 each by Does not apply route 3 (three) times daily as needed.,  Disp: 100 each, Rfl: 1   ipratropium (ATROVENT) 0.06 % nasal spray, Place 2 sprays into both nostrils 4 (four) times daily., Disp: 15 mL, Rfl: 12   Lancets (ONETOUCH DELICA PLUS [id number]) MISC, , Disp: , Rfl:    losartan (COZAAR) 100 MG tablet, Take 1 tablet (100 mg total) by mouth daily., Disp: 90 tablet, Rfl: 1   ONETOUCH ULTRA test strip, TEST UP TO 4 TIMES A DAY, Disp: 100 strip, Rfl: 0   OZEMPIC, 0.25 OR 0.5 MG/DOSE, 2 MG/1.5ML SOPN, INJECT 0.5 MG INTO THE SKIN ONCE A WEEK., Disp: 6 mL, Rfl: 0   pioglitazone (ACTOS) 15 MG tablet, Take 1 tablet (15 mg total) by mouth daily., Disp: 90 tablet, Rfl: 1   pregabalin (LYRICA) 150 MG capsule, Take 1 capsule (150 mg total) by mouth 2 (two) times daily. (Patient taking differently: Take 150 mg by mouth at bedtime.), Disp: 180 capsule, Rfl: 1   tiZANidine (ZANAFLEX) 2 MG tablet, TAKE 1 TABLET BY MOUTH EVERY DAY IN THE EVENING, Disp: 90 tablet, Rfl: 0   atorvastatin (LIPITOR) 20 MG tablet, Take 1 tablet (20 mg total) by mouth daily at 6 PM. New dose, Disp: 90 tablet, Rfl: 1  No Known Allergies  I personally reviewed active problem list, medication list, allergies, family history, social history, health maintenance with the patient/caregiver today.   ROS  Constitutional: Negative for fever , positive for weight change.  Respiratory: Negative for cough and shortness of breath.   Cardiovascular: Negative for chest pain or palpitations.  Gastrointestinal: Negative for abdominal pain, no bowel changes.  Musculoskeletal: Negative for gait problem or joint swelling.  Skin: Negative for rash.  Neurological: Negative for dizziness or headache.  No other specific complaints in a complete review of systems (except as listed in HPI above).   Objective  Vitals:   02/18/21 1052  BP: 132/72  Pulse: 86  Resp: 16  Temp: 98.1 F (36.7 C)  SpO2: 98%  Weight: 179 lb (81.2 kg)  Height: _0  (1.727 m)    Body mass index is 27.22 kg/m.  Physical  Exam  Constitutional: Patient appears well-developed and well-nourished. Overweight.  No distress.  HEENT: head atraumatic, normocephalic, pupils equal and reactive to light, neck supple Cardiovascular: Normal rate, regular rhythm and normal heart sounds.  No murmur heard. No BLE edema. Pulmonary/Chest: Effort normal and breath sounds normal. No respiratory distress. Abdominal: Soft.  There is no tenderness. Psychiatric: Patient  has a normal mood and affect. behavior is normal. Judgment and thought content normal.   Recent Results (from the past 2160 hour(s))  POCT HgB A1C     Status: Abnormal   Collection Time: 02/18/21 11:04 AM  Result Value Ref Range   Hemoglobin A1C 11.8 (A) 4.0 - 5.6 %   HbA1c POC (<> result, manual entry)     HbA1c, POC (prediabetic range)     HbA1c, POC (controlled diabetic range)       PHQ2/9: Depression screen [facility] 2/9 02/18/2021 11/14/2020 09/23/2020 08/05/2020 07/16/2020  Decreased Interest 0 0 0 0 0  Down, Depressed, Hopeless 0 2 0 0 0  PHQ - 2 Score 0 2 0 0 0  Altered sleeping 0 1 - - -  Tired, decreased energy 0 0 - - -  Change in appetite 3 3 - - -  Feeling bad or failure about yourself  0 3 - - -  Trouble concentrating 0 0 - - -  Moving slowly or fidgety/restless 0 0 - - -  Suicidal thoughts 0 0 - - -  PHQ-9 Score 3 9 - - -  Difficult doing work/chores - Somewhat difficult - - -  Some recent data might be hidden    phq 9 is negative   Fall Risk: Fall Risk  02/18/2021 11/14/2020 09/23/2020 08/05/2020 07/16/2020  Falls in the past year? 0 0 0 0 0  Number falls in past yr: 0 0 0 0 0  Injury with Fall? 0 0 0 0 0  Risk for fall due to : No Fall Risks - - No Fall Risks -  Follow up Falls prevention discussed - Falls evaluation completed Falls prevention discussed -      Functional Status Survey: Is the patient deaf or have difficulty hearing?: No Does the patient have difficulty seeing, even when wearing glasses/contacts?: No Does the patient  have difficulty concentrating, remembering, or making decisions?: Yes Does the patient have difficulty walking or climbing stairs?: No Does the patient have difficulty dressing or bathing?: No Does the patient have difficulty doing errands alone such as visiting a doctor's office or shopping?: No    Assessment & Plan  1. Hyperlipidemia associated with type 2 diabetes mellitus (HCC)  - pioglitazone (ACTOS) 15 MG tablet; Take 1 tablet (15 mg total) by mouth daily.  Dispense: 90 tablet; Refill: 1  2. Hypertension associated with type 2 diabetes mellitus (HCC)  - pioglitazone (ACTOS) 15 MG tablet; Take 1 tablet (15 mg total) by mouth daily.  Dispense: 90 tablet; Refill: 1  3. Uncontrolled type 2 diabetes mellitus with hyperglycemia (HCC)  - POCT HgB A1C  4. Atherosclerosis of aorta ([place])  On statin therapy   5. Mild protein-calorie malnutrition (HCC)  - COMPLETE METABOLIC PANEL WITH GFR  6. Need for immunization against influenza  - Flu Vaccine QUAD High Dose(Fluad)  7. Dyslipidemia  - atorvastatin (LIPITOR) 20 MG tablet; Take 1 tablet (20 mg total) by mouth daily at 6 PM. New dose  Dispense: 90 tablet; Refill: 1  8. Vitamin D deficiency   9. Essential hypertension  At goal  10. Intermittent low back pain  Stable, zanaflex work  11. Other iron deficiency anemia  - CBC with Differential/Platelet - Iron, TIBC and Ferritin Panel

## 2021-02-25 ENCOUNTER — Encounter: Payer: Self-pay | Admitting: Family Medicine

## 2021-02-25 ENCOUNTER — Other Ambulatory Visit: Payer: Self-pay

## 2021-02-25 ENCOUNTER — Ambulatory Visit (INDEPENDENT_AMBULATORY_CARE_PROVIDER_SITE_OTHER): Payer: Medicare Other | Admitting: Family Medicine

## 2021-02-25 VITALS — BP 140/82 | HR 92 | Temp 98.5°F | Resp 16 | Ht 68.0 in | Wt 181.2 lb

## 2021-02-25 DIAGNOSIS — R21 Rash and other nonspecific skin eruption: Secondary | ICD-10-CM | POA: Diagnosis not present

## 2021-02-25 MED ORDER — METHYLPREDNISOLONE 4 MG PO TBPK: ORAL_TABLET | ORAL | 0 refills | Status: DC

## 2021-02-25 NOTE — Patient Instructions (Signed)
It was great to see you!  Our plans for today:  - Take the steroids as prescribed.  - Come back to see [place] if this doesn't help.   Take care and seek immediate care sooner if you develop any concerns.   Dr. Linwood Dibbles

## 2021-02-25 NOTE — Progress Notes (Signed)
    SUBJECTIVE:   CHIEF COMPLAINT / HPI:   RASH - worked in [employer] this weekend and thinks he got into dust mites Duration:   4-5 days   Location: generalized, trunk, arms, and legs  Itching: yes Burning: yes Redness: yes Oozing: no Scaling: no Blisters: no Painful: yes Fevers: no Change in detergents/soaps/personal care products: no Recent illness: no Recent travel:no History of same: no Context: worse Alleviating factors: nothing Treatments attempted: antibiotic ointment , hydrocortisone cream Shortness of breath: no  Throat/tongue swelling: no Myalgias/arthralgias: no   OBJECTIVE:   BP 140/82   Pulse 92   Temp 98.5 F (36.9 C)   Resp 16   Ht 5\' 8"  (1.727 m)   Wt 181 lb 3.2 oz (82.2 kg)   SpO2 94%   BMI 27.55 kg/m   Gen: well appearing, in NAD Skin: diffuse slightly erythematous hives to chest, b/l thighs, forearms Ext: WWP, no edema   ASSESSMENT/PLAN:   Rash Likely 2/2 contact allergy of unknown trigger, possible dust mites. Rx medrol dose pack. F/u if no better.    , DO

## 2021-03-15 ENCOUNTER — Other Ambulatory Visit: Payer: Self-pay | Admitting: Family Medicine

## 2021-03-15 NOTE — Telephone Encounter (Signed)
Requested medication (s) are due for refill today: yes  Requested medication (s) are on the active medication list: yes  Last refill:  07/16/20 #180 1 RF  Future visit scheduled: yes  Notes to clinic:  med not delegated to NT to RF   Requested Prescriptions  Pending Prescriptions Disp Refills   pregabalin (LYRICA) 150 MG capsule [Pharmacy Med Name: PREGABALIN 150MG  CAPSULES] 180 capsule     Sig: TAKE 1 CAPSULE BY MOUTH TWICE DAILY     Not Delegated - Neurology:  Anticonvulsants - Controlled Failed - 03/15/2021  1:51 PM      Failed - This refill cannot be delegated      Passed - Valid encounter within last 12 months    Recent Outpatient Visits           2 weeks ago Rash   [facility] M, DO   3 weeks ago Hyperlipidemia associated with type 2 diabetes mellitus [facility])   [facility], MD   4 months ago Uncontrolled type 2 diabetes mellitus with hyperglycemia [facility])   [facility], Leugnies, MD   5 months ago Uncontrolled type 2 diabetes mellitus with hyperglycemia [facility])   [facility], NP   8 months ago Atherosclerosis of aorta [facility])   [facility], MD       Future Appointments             In 2 months Alba Cory, Carlynn Purl, MD [facility], [facility]   In 4 months  [facility], [facility]

## 2021-05-07 ENCOUNTER — Encounter: Payer: Self-pay | Admitting: Family Medicine

## 2021-05-07 ENCOUNTER — Other Ambulatory Visit: Payer: Self-pay

## 2021-05-07 ENCOUNTER — Ambulatory Visit (INDEPENDENT_AMBULATORY_CARE_PROVIDER_SITE_OTHER): Payer: Medicare Other | Admitting: Family Medicine

## 2021-05-07 VITALS — BP 140/86 | HR 90 | Temp 98.1°F | Resp 16 | Ht 67.0 in | Wt 172.0 lb

## 2021-05-07 DIAGNOSIS — R413 Other amnesia: Secondary | ICD-10-CM

## 2021-05-07 DIAGNOSIS — E1165 Type 2 diabetes mellitus with hyperglycemia: Secondary | ICD-10-CM | POA: Diagnosis not present

## 2021-05-07 DIAGNOSIS — R051 Acute cough: Secondary | ICD-10-CM

## 2021-05-07 DIAGNOSIS — J3489 Other specified disorders of nose and nasal sinuses: Secondary | ICD-10-CM | POA: Diagnosis not present

## 2021-05-07 MED ORDER — BENZONATATE 100 MG PO CAPS: 100.0000 mg | ORAL_CAPSULE | Freq: Two times a day (BID) | ORAL | 0 refills | Status: DC | PRN

## 2021-05-07 MED ORDER — BLOOD GLUCOSE METER KIT: PACK | 0 refills | Status: DC

## 2021-05-07 MED ORDER — AZELASTINE HCL 0.1 % NA SOLN: 2.0000 | Freq: Two times a day (BID) | NASAL | 2 refills | Status: AC

## 2021-05-07 NOTE — Progress Notes (Signed)
Name: William Parks   MRN: [id number]    DOB: 07-Jun-1945   Date:05/07/2021       Progress Note  Subjective  Chief Complaint  Cold  HPI  He states he has been sick for the past 10 days  He states he came in because his daughter is worried. He has rhinorrhea and productive cough that can be clear or yellow. Denies SOB or wheezing. He has been taking some otc Mucinex Multicold . He denies any fever, chills or change in appetite. He had negative home COVID-19 tests. He had flu vaccine and two of the COVID-19 vaccines, discussed booster.   He quit smoking in 2022   He has DM, glucose is still elevated in the low 200's. He states he needs to go get a new glucose machine since his last one broke.   Patient Active Problem List   Diagnosis Date Noted   S/P right colectomy 11/14/2020   DM (diabetes mellitus), type 2, uncontrolled 08/26/2020   Atherosclerosis of aorta ([place]) 10/03/2019   Neck and shoulder pain 11/08/2016   Vitamin D deficiency 10/15/2015   Dyslipidemia 06/02/2015   Hypertension 04/01/2015   Uncontrolled type 2 diabetes mellitus with peripheral neuropathy 04/01/2015    Past Surgical History:  Procedure Laterality Date   APPENDECTOMY     CYST REMOVAL TRUNK     2x   LAPAROSCOPY N/A 09/01/2020   Procedure: LAPAROSCOPY DIAGNOSTIC;  Surgeon: Olean Ree, MD;  Location: [facility];  Service: General;  Laterality: N/A;   PARTIAL COLECTOMY N/A 09/01/2020   Procedure: PARTIAL RIGHT COLECTOMY;  Surgeon: Olean Ree, MD;  Location: [facility];  Service: General;  Laterality: N/A;   SHOULDER SURGERY Right 2009   rotator cuff surgery    Family History  Problem Relation Age of Onset   Cancer Mother        Breast CA   Diabetes Mother    Hypertension Mother    Diabetes Brother     Social History   Tobacco Use   Smoking status: Former    Packs/day: 0.25    Years: 50.00    Pack years: 12.50    Types: Cigarettes    Start date: 07/27/1967    Quit date: 08/27/2020    Years since  quitting: 0.6   Smokeless tobacco: Never  Substance Use Topics   Alcohol use: No    Alcohol/week: 0.0 standard drinks     Current Outpatient Medications:    aspirin 81 MG EC tablet, Take 81 mg by mouth daily., Disp: , Rfl: 5   atorvastatin (LIPITOR) 20 MG tablet, Take 1 tablet (20 mg total) by mouth daily at 6 PM. New dose, Disp: 90 tablet, Rfl: 1   azelastine (ASTELIN) 0.1 % nasal spray, Place 2 sprays into both nostrils 2 (two) times daily. Use in each nostril as directed, Disp: 30 mL, Rfl: 2   benzonatate (TESSALON) 100 MG capsule, Take 1-2 capsules (100-200 mg total) by mouth 2 (two) times daily as needed., Disp: 40 capsule, Rfl: 0   blood glucose meter kit and supplies, Dispense based on patient and insurance preference. Use up to four times daily as directed. (FOR ICD-10 E10.9, E11.9)., Disp: 1 each, Rfl: 0   docusate sodium (COLACE) 100 MG capsule, Take 100 mg by mouth daily., Disp: , Rfl:    ibuprofen (ADVIL) 600 MG tablet, Take 1 tablet (600 mg total) by mouth every 6 (six) hours as needed., Disp: 30 tablet, Rfl: 0   Insulin Lispro Prot & Lispro (  HUMALOG MIX 75/25 KWIKPEN) (75-25) 100 UNIT/ML Kwikpen, INJECT INTO THE SKIN 2 (TWO) TIMES DAILY WITH A MEAL. 30 UNITS IN AM AND 25 UNITS BEFORE DINNER, Disp: 15 mL, Rfl: 0   Insulin Pen Needle 31G X 6 MM MISC, 1 each by Does not apply route 3 (three) times daily as needed., Disp: 100 each, Rfl: 1   ipratropium (ATROVENT) 0.06 % nasal spray, Place 2 sprays into both nostrils 4 (four) times daily., Disp: 15 mL, Rfl: 12   Lancets (ONETOUCH DELICA PLUS [id number]) MISC, , Disp: , Rfl:    losartan (COZAAR) 100 MG tablet, Take 1 tablet (100 mg total) by mouth daily., Disp: 90 tablet, Rfl: 1   ONETOUCH ULTRA test strip, TEST UP TO 4 TIMES A DAY, Disp: 100 strip, Rfl: 0   OZEMPIC, 0.25 OR 0.5 MG/DOSE, 2 MG/1.5ML SOPN, INJECT 0.5 MG INTO THE SKIN ONCE A WEEK., Disp: 6 mL, Rfl: 0   pioglitazone (ACTOS) 15 MG tablet, Take 1 tablet (15 mg total) by mouth  daily., Disp: 90 tablet, Rfl: 1   pregabalin (LYRICA) 150 MG capsule, Take 1 capsule (150 mg total) by mouth at bedtime., Disp: 90 capsule, Rfl: 0   tiZANidine (ZANAFLEX) 2 MG tablet, TAKE 1 TABLET BY MOUTH EVERY DAY IN THE EVENING, Disp: 90 tablet, Rfl: 0  No Known Allergies  I personally reviewed active problem list, medication list, allergies, family history, social history, health maintenance with the patient/caregiver today.   ROS  Ten systems reviewed and is negative except as mentioned in HPI    Objective  Vitals:   05/07/21 1124  BP: 140/86  Pulse: 90  Resp: 16  Temp: 98.1 F (36.7 C)  SpO2: 95%  Weight: 172 lb (78 kg)  Height: 5' 7"  (1.702 m)    Body mass index is 26.94 kg/m.  Physical Exam  Constitutional: Patient appears well-developed   No distress.  HEENT: head atraumatic, normocephalic, pupils equal and reactive to light, ears normal TM,  neck supple, clear rhinorrhea  Cardiovascular: Normal rate, regular rhythm and normal heart sounds.  No murmur heard. No BLE edema. Pulmonary/Chest: Effort normal and breath sounds normal. No respiratory distress. Abdominal: Soft.  There is no tenderness. Psychiatric: Patient has a normal mood and affect. behavior is normal. Judgment and thought content normal.   Recent Results (from the past 2160 hour(s))  POCT HgB A1C     Status: Abnormal   Collection Time: 02/18/21 11:04 AM  Result Value Ref Range   Hemoglobin A1C 11.8 (A) 4.0 - 5.6 %   HbA1c POC (<> result, manual entry)     HbA1c, POC (prediabetic range)     HbA1c, POC (controlled diabetic range)      PHQ2/9: Depression screen [facility] 2/9 05/07/2021 02/25/2021 02/18/2021 11/14/2020 09/23/2020  Decreased Interest 0 0 0 0 0  Down, Depressed, Hopeless 0 0 0 2 0  PHQ - 2 Score 0 0 0 2 0  Altered sleeping 0 0 0 1 -  Tired, decreased energy 0 0 0 0 -  Change in appetite 0 0 3 3 -  Feeling bad or failure about yourself  0 0 0 3 -  Trouble concentrating 0 0 0 0 -  Moving  slowly or fidgety/restless 0 0 0 0 -  Suicidal thoughts 0 0 0 0 -  PHQ-9 Score 0 0 3 9 -  Difficult doing work/chores - Not difficult at all - Somewhat difficult -  Some recent data might be hidden  phq 9 is negative   Fall Risk: Fall Risk  05/07/2021 02/25/2021 02/18/2021 11/14/2020 09/23/2020  Falls in the past year? 0 0 0 0 0  Number falls in past yr: 0 0 0 0 0  Injury with Fall? 0 0 0 0 0  Risk for fall due to : No Fall Risks - No Fall Risks - -  Follow up Falls prevention discussed - Falls prevention discussed - Falls evaluation completed      Functional Status Survey: Is the patient deaf or have difficulty hearing?: No Does the patient have difficulty seeing, even when wearing glasses/contacts?: No Does the patient have difficulty concentrating, remembering, or making decisions?: No Does the patient have difficulty walking or climbing stairs?: No Does the patient have difficulty dressing or bathing?: No Does the patient have difficulty doing errands alone such as visiting a doctor's office or shopping?: No    Assessment & Plan  1. Acute cough  - benzonatate (TESSALON) 100 MG capsule; Take 1-2 capsules (100-200 mg total) by mouth 2 (two) times daily as needed.  Dispense: 40 capsule; Refill: 0 - azelastine (ASTELIN) 0.1 % nasal spray; Place 2 sprays into both nostrils 2 (two) times daily. Use in each nostril as directed  Dispense: 30 mL; Refill: 2  2. Rhinorrhea  - benzonatate (TESSALON) 100 MG capsule; Take 1-2 capsules (100-200 mg total) by mouth 2 (two) times daily as needed.  Dispense: 40 capsule; Refill: 0 - azelastine (ASTELIN) 0.1 % nasal spray; Place 2 sprays into both nostrils 2 (two) times daily. Use in each nostril as directed  Dispense: 30 mL; Refill: 2  3. Uncontrolled type 2 diabetes mellitus with hyperglycemia ([facility])  - blood glucose meter kit and supplies; Dispense based on patient and insurance preference. Use up to four times daily as directed. (FOR  ICD-10 E10.9, E11.9).  Dispense: 1 each; Refill: 0  4. Memory change  Spoke to daughter Blair Promise per patient's request, she is also worried about his memory, he will return tomorrow with his son Jenny Reichmann ( goes by [place]) for [employer]. Explained we have noticed a change in cognition but it may be metabolic

## 2021-05-08 ENCOUNTER — Ambulatory Visit (INDEPENDENT_AMBULATORY_CARE_PROVIDER_SITE_OTHER): Payer: Medicare Other | Admitting: Family Medicine

## 2021-05-08 ENCOUNTER — Encounter: Payer: Self-pay | Admitting: Family Medicine

## 2021-05-08 VITALS — BP 138/82 | HR 82 | Temp 98.1°F | Resp 16 | Ht 67.0 in | Wt 172.0 lb

## 2021-05-08 DIAGNOSIS — F09 Unspecified mental disorder due to known physiological condition: Secondary | ICD-10-CM

## 2021-05-08 DIAGNOSIS — R413 Other amnesia: Secondary | ICD-10-CM | POA: Diagnosis not present

## 2021-05-08 NOTE — Progress Notes (Signed)
Name: William Parks   MRN: [id number]    DOB: 07-25-1945   Date:05/08/2021       Progress Note  Subjective  Chief Complaint  Memory Test  HPI  Memory changes: son came in with him today. Spoke to his daughter yesterday. Both of his children live out of state. Son in [place] and daughter in [place]. They see him every 3-6 months for just a few days, he has not noticed any changes, but his daughter noticed a change in his memory over the past year. He seems forgetful and repeating same questions. MSS down to 22 today. We will check labs and refer to neurologist .     Patient Active Problem List   Diagnosis Date Noted   S/P right colectomy 11/14/2020   DM (diabetes mellitus), type 2, uncontrolled 08/26/2020   Atherosclerosis of aorta ([place]) 10/03/2019   Neck and shoulder pain 11/08/2016   Vitamin D deficiency 10/15/2015   Dyslipidemia 06/02/2015   Hypertension 04/01/2015   Uncontrolled type 2 diabetes mellitus with peripheral neuropathy 04/01/2015    Past Surgical History:  Procedure Laterality Date   APPENDECTOMY     CYST REMOVAL TRUNK     2x   LAPAROSCOPY N/A 09/01/2020   Procedure: LAPAROSCOPY DIAGNOSTIC;  Surgeon: Olean Ree, MD;  Location: [facility];  Service: General;  Laterality: N/A;   PARTIAL COLECTOMY N/A 09/01/2020   Procedure: PARTIAL RIGHT COLECTOMY;  Surgeon: Olean Ree, MD;  Location: [facility];  Service: General;  Laterality: N/A;   SHOULDER SURGERY Right 2009   rotator cuff surgery    Family History  Problem Relation Age of Onset   Cancer Mother        Breast CA   Diabetes Mother    Hypertension Mother    Diabetes Brother     Social History   Tobacco Use   Smoking status: Former    Packs/day: 0.25    Years: 50.00    Pack years: 12.50    Types: Cigarettes    Start date: 07/27/1967    Quit date: 08/27/2020    Years since quitting: 0.6   Smokeless tobacco: Never  Substance Use Topics   Alcohol use: No    Alcohol/week: 0.0 standard drinks      Current Outpatient Medications:    aspirin 81 MG EC tablet, Take 81 mg by mouth daily., Disp: , Rfl: 5   atorvastatin (LIPITOR) 20 MG tablet, Take 1 tablet (20 mg total) by mouth daily at 6 PM. New dose, Disp: 90 tablet, Rfl: 1   azelastine (ASTELIN) 0.1 % nasal spray, Place 2 sprays into both nostrils 2 (two) times daily. Use in each nostril as directed, Disp: 30 mL, Rfl: 2   benzonatate (TESSALON) 100 MG capsule, Take 1-2 capsules (100-200 mg total) by mouth 2 (two) times daily as needed., Disp: 40 capsule, Rfl: 0   blood glucose meter kit and supplies, Dispense based on patient and insurance preference. Use up to four times daily as directed. (FOR ICD-10 E10.9, E11.9)., Disp: 1 each, Rfl: 0   docusate sodium (COLACE) 100 MG capsule, Take 100 mg by mouth daily., Disp: , Rfl:    ibuprofen (ADVIL) 600 MG tablet, Take 1 tablet (600 mg total) by mouth every 6 (six) hours as needed., Disp: 30 tablet, Rfl: 0   Insulin Lispro Prot & Lispro (HUMALOG MIX 75/25 KWIKPEN) (75-25) 100 UNIT/ML Kwikpen, INJECT INTO THE SKIN 2 (TWO) TIMES DAILY WITH A MEAL. 30 UNITS IN AM AND 25 UNITS BEFORE DINNER,  Disp: 15 mL, Rfl: 0 °  Insulin Pen Needle 31G X 6 MM MISC, 1 each by Does not apply route 3 (three) times daily as needed., Disp: 100 each, Rfl: 1 °  ipratropium (ATROVENT) 0.06 % nasal spray, Place 2 sprays into both nostrils 4 (four) times daily., Disp: 15 mL, Rfl: 12 °  Lancets (ONETOUCH DELICA PLUS LANCET33G) MISC, , Disp: , Rfl:  °  losartan (COZAAR) 100 MG tablet, Take 1 tablet (100 mg total) by mouth daily., Disp: 90 tablet, Rfl: 1 °  ONETOUCH ULTRA test strip, TEST UP TO 4 TIMES A DAY, Disp: 100 strip, Rfl: 0 °  OZEMPIC, 0.25 OR 0.5 MG/DOSE, 2 MG/1.5ML SOPN, INJECT 0.5 MG INTO THE SKIN ONCE A WEEK., Disp: 6 mL, Rfl: 0 °  pioglitazone (ACTOS) 15 MG tablet, Take 1 tablet (15 mg total) by mouth daily., Disp: 90 tablet, Rfl: 1 °  pregabalin (LYRICA) 150 MG capsule, Take 1 capsule (150 mg total) by mouth at  bedtime., Disp: 90 capsule, Rfl: 0 °  tiZANidine (ZANAFLEX) 2 MG tablet, TAKE 1 TABLET BY MOUTH EVERY DAY IN THE EVENING, Disp: 90 tablet, Rfl: 0 ° °No Known Allergies ° °I personally reviewed active problem list, medication list, allergies, family history, social history, health maintenance with the patient/caregiver today. ° ° °ROS ° °Ten systems reviewed and is negative except as mentioned in HPI ° °Objective ° °Vitals:  ° 05/08/21 1303  °BP: 138/82  °Pulse: 82  °Resp: 16  °Temp: 98.1 °F (36.7 °C)  °Weight: 172 lb (78 kg)  °Height: 5' 7" (1.702 m)  ° ° °Body mass index is 26.94 kg/m². ° °Physical Exam ° °Constitutional: Patient appears well-developed and well-nourished.  No distress.  °HEENT: head atraumatic, normocephalic, pupils equal and reactive to light,  neck supple, rhinorrhea  °Cardiovascular: Normal rate, regular rhythm and normal heart sounds.  No murmur heard. No BLE edema. °Pulmonary/Chest: Effort normal and breath sounds normal. No respiratory distress. °Abdominal: Soft.  There is no tenderness. °Psychiatric: Patient has a normal mood and affect. behavior is normal. Judgment and thought content normal.  °Neurological: normal cranial nerves, normal grip, normal walk , normal sensation, romberg negative  ° °Recent Results (from the past 2160 hour(s))  °POCT HgB A1C     Status: Abnormal  ° Collection Time: 02/18/21 11:04 AM  °Result Value Ref Range  ° Hemoglobin A1C 11.8 (A) 4.0 - 5.6 %  ° HbA1c POC (<> result, manual entry)    ° HbA1c, POC (prediabetic range)    ° HbA1c, POC (controlled diabetic range)    ° ° °PHQ2/9: °Depression screen [facility] 2/9 05/08/2021 05/07/2021 02/25/2021 02/18/2021 11/14/2020  °Decreased Interest 0 0 0 0 0  °Down, Depressed, Hopeless 0 0 0 0 2  °PHQ - 2 Score 0 0 0 0 2  °Altered sleeping 0 0 0 0 1  °Tired, decreased energy 0 0 0 0 0  °Change in appetite 0 0 0 3 3  °Feeling bad or failure about yourself  0 0 0 0 3  °Trouble concentrating 0 0 0 0 0  °Moving slowly or fidgety/restless 0  0 0 0 0  °Suicidal thoughts 0 0 0 0 0  °PHQ-9 Score 0 0 0 3 9  °Difficult doing work/chores - - Not difficult at all - Somewhat difficult  °Some recent data might be hidden  °  °phq 9 is negative ° ° °Fall Risk: °Fall Risk  05/08/2021 05/07/2021 02/25/2021 02/18/2021 11/14/2020  °Falls in the past year?   0 0 0 0 0  Number falls in past yr: 0 0 0 0 0  Injury with Fall? 0 0 0 0 0  Risk for fall due to : No Fall Risks No Fall Risks - No Fall Risks -  Follow up Falls prevention discussed Falls prevention discussed - Falls prevention discussed -      Functional Status Survey: Is the patient deaf or have difficulty hearing?: No Does the patient have difficulty seeing, even when wearing glasses/contacts?: No Does the patient have difficulty concentrating, remembering, or making decisions?: No Does the patient have difficulty walking or climbing stairs?: No Does the patient have difficulty dressing or bathing?: No Does the patient have difficulty doing errands alone such as visiting a doctor's office or shopping?: No    Assessment & Plan  1. Cognitive dysfunction  - RPR - B12 and Folate Panel - CBC with Differential/Platelet - COMPLETE METABOLIC PANEL WITH GFR - TSH - Ambulatory referral to Neurology

## 2021-05-09 LAB — CBC WITH DIFFERENTIAL/PLATELET
Absolute Monocytes: 377 cells/uL (ref 200–950)
Basophils Absolute: 21 cells/uL (ref 0–200)
Basophils Relative: 0.5 %
Eosinophils Absolute: 98 cells/uL (ref 15–500)
Eosinophils Relative: 2.4 %
HCT: 40.2 % (ref 38.5–50.0)
Hemoglobin: 13.1 g/dL — ABNORMAL LOW (ref 13.2–17.1)
Lymphs Abs: 1337 cells/uL (ref 850–3900)
MCH: 28.8 pg (ref 27.0–33.0)
MCHC: 32.6 g/dL (ref 32.0–36.0)
MCV: 88.4 fL (ref 80.0–100.0)
MPV: 11 fL (ref 7.5–12.5)
Monocytes Relative: 9.2 %
Neutro Abs: 2267 cells/uL (ref 1500–7800)
Neutrophils Relative %: 55.3 %
Platelets: 284 10*3/uL (ref 140–400)
RBC: 4.55 10*6/uL (ref 4.20–5.80)
RDW: 12.8 % (ref 11.0–15.0)
Total Lymphocyte: 32.6 %
WBC: 4.1 10*3/uL (ref 3.8–10.8)

## 2021-05-09 LAB — COMPLETE METABOLIC PANEL WITH GFR
AG Ratio: 1.2 (calc) (ref 1.0–2.5)
ALT: 15 U/L (ref 9–46)
AST: 14 U/L (ref 10–35)
Albumin: 3.6 g/dL (ref 3.6–5.1)
Alkaline phosphatase (APISO): 69 U/L (ref 35–144)
BUN: 11 mg/dL (ref 7–25)
CO2: 33 mmol/L — ABNORMAL HIGH (ref 20–32)
Calcium: 9.7 mg/dL (ref 8.6–10.3)
Chloride: 101 mmol/L (ref 98–110)
Creat: 0.98 mg/dL (ref 0.70–1.28)
Globulin: 2.9 g/dL (calc) (ref 1.9–3.7)
Glucose, Bld: 240 mg/dL — ABNORMAL HIGH (ref 65–99)
Potassium: 3.8 mmol/L (ref 3.5–5.3)
Sodium: 141 mmol/L (ref 135–146)
Total Bilirubin: 0.7 mg/dL (ref 0.2–1.2)
Total Protein: 6.5 g/dL (ref 6.1–8.1)
eGFR: 80 mL/min/{1.73_m2} (ref >=60)

## 2021-05-09 LAB — RPR: RPR Ser Ql: NONREACTIVE

## 2021-05-09 LAB — B12 AND FOLATE PANEL
Folate: 10.6 ng/mL
Vitamin B-12: 432 pg/mL (ref 200–1100)

## 2021-05-09 LAB — TSH: TSH: 1.51 mIU/L (ref 0.40–4.50)

## 2021-05-15 ENCOUNTER — Other Ambulatory Visit: Payer: Self-pay | Admitting: Family Medicine

## 2021-05-15 DIAGNOSIS — I10 Essential (primary) hypertension: Secondary | ICD-10-CM

## 2021-05-15 MED ORDER — LOSARTAN POTASSIUM 100 MG PO TABS: 100.0000 mg | ORAL_TABLET | Freq: Every day | ORAL | 0 refills | Status: DC

## 2021-05-15 NOTE — Telephone Encounter (Signed)
Medication Refill - Medication: losartan (COZAAR) 100 MG tablet   Has the patient contacted their pharmacy? Yes.   (Agent: If no, request that the patient contact the pharmacy for the refill. If patient does not wish to contact the pharmacy document the reason why and proceed with request.) (Agent: If yes, when and what did the pharmacy advise?)  Preferred Pharmacy (with phone number or street name):  [id number] [place], [place] - [address] AT [facility] OF [address]  [address] [place] [address]  Phone: [phone] Fax: [phone]   Has the patient been seen for an appointment in the last year OR does the patient have an upcoming appointment? Yes.    Agent: Please be advised that RX refills may take up to 3 business days. We ask that you follow-up with your pharmacy.

## 2021-05-15 NOTE — Telephone Encounter (Signed)
Requested Prescriptions  Pending Prescriptions Disp Refills   losartan (COZAAR) 100 MG tablet 90 tablet 0    Sig: Take 1 tablet (100 mg total) by mouth daily.     Cardiovascular:  Angiotensin Receptor Blockers Passed - 05/15/2021 12:21 PM      Passed - Cr in normal range and within 180 days    Creat  Date Value Ref Range Status  05/08/2021 0.98 0.70 - 1.28 mg/dL Final   Creatinine, Urine  Date Value Ref Range Status  04/06/2019 109 20 - 320 mg/dL Final         Passed - K in normal range and within 180 days    Potassium  Date Value Ref Range Status  05/08/2021 3.8 3.5 - 5.3 mmol/L Final         Passed - Patient is not pregnant      Passed - Last BP in normal range    BP Readings from Last 1 Encounters:  05/08/21 138/82         Passed - Valid encounter within last 6 months    Recent Outpatient Visits          1 week ago Cognitive dysfunction   [facility] Steele Sizer, MD   1 week ago Acute cough   [facility] Steele Sizer, MD   2 months ago [facility] Rory Percy M, DO   2 months ago Hyperlipidemia associated with type 2 diabetes mellitus [facility])   [facility] Steele Sizer, MD   6 months ago Uncontrolled type 2 diabetes mellitus with hyperglycemia [facility])   [facility] Steele Sizer, MD      Future Appointments            In 1 week Steele Sizer, MD [facility] [place]   In 2 months  [facility], [facility]

## 2021-05-22 NOTE — Progress Notes (Signed)
Name: William Parks   MRN: [id number]    DOB: 21-Aug-1945   Date:05/25/2021       Progress Note  Subjective  Chief Complaint  Follow Up  HPI  DMII: he was seen back in April 2021 and A1C was 12.4 %,  11.1 %  to 9.2 % and back in May  2022 10.5 % , up to 11.8 %  down to 11 % . He is now on dose of insulin  75/25 he is supposed to be taking 30 in am and 25 in pm, and also on  Ozempic 0.5 once a week, however daughter, [name], came in with him today and states she has been at the house since last Thursday and have not seen him inject himself yet. Marland Kitchen He has associated HTN, dyslipidemia and neuropathy. He is taking Lyrica as prescribed for diabetic neuropathy and seems to be controlling symptoms. He cannot tolerate Metformin but is taking Actos. We will send rx to [facility] so we can monitor compliance. He does not want to go see Endo, but daughter wants him to go   HTN: he is taking losartan 100 daily but states not longer taking Metoprolol. BP is at goal . Denies chest pain or palpitation    Dyslipidemia: he is taking Atorvastatin, not sure how compliant he has been with medication    Tobacco use: he quit May 4 th, 2022 with the nicotine patch. No cough or wheezing, Doing well    Intermittent low back pain: he states doing well lately, Lyrica seems to help , takes Tizanidine prn only    Malnutrition: he states he used to be in the 210 lbs range, went down to 187 lbs gradually started to gain weight, but in May was admitted for rupture appendix and  after that had COVID-19:  He has lack of appetite and weight was down to 171 lbs , now at 174 lbs stable around that range. He has lost 10 % down from original weight    Atherosclerosis of aorta: on statin and aspirin daily , last LDL at 74 , he is now on Atorvastatin 20 mg but last bottle filled in October and bottle is still full. Daughter lives in [place] and states since she has been home last Thursday she has not seen him taking his medications  He is  still driving but only to grocery store, doctors visits, barber shop  Patient Active Problem List   Diagnosis Date Noted   S/P right colectomy 11/14/2020   DM (diabetes mellitus), type 2, uncontrolled 08/26/2020   Atherosclerosis of aorta ([place]) 10/03/2019   Neck and shoulder pain 11/08/2016   Vitamin D deficiency 10/15/2015   Dyslipidemia 06/02/2015   Hypertension 04/01/2015   Uncontrolled type 2 diabetes mellitus with peripheral neuropathy 04/01/2015    Past Surgical History:  Procedure Laterality Date   APPENDECTOMY     CYST REMOVAL TRUNK     2x   LAPAROSCOPY N/A 09/01/2020   Procedure: LAPAROSCOPY DIAGNOSTIC;  Surgeon: Olean Ree, MD;  Location: [facility] ORS;  Service: General;  Laterality: N/A;   PARTIAL COLECTOMY N/A 09/01/2020   Procedure: PARTIAL RIGHT COLECTOMY;  Surgeon: Olean Ree, MD;  Location: [facility] ORS;  Service: General;  Laterality: N/A;   SHOULDER SURGERY Right 2009   rotator cuff surgery    Family History  Problem Relation Age of Onset   Cancer Mother        Breast CA   Diabetes Mother    Hypertension Mother  Diabetes Brother     Social History   Tobacco Use   Smoking status: Former    Packs/day: 0.25    Years: 50.00    Pack years: 12.50    Types: Cigarettes    Start date: 07/27/1967    Quit date: 08/27/2020    Years since quitting: 0.7   Smokeless tobacco: Never  Substance Use Topics   Alcohol use: No    Alcohol/week: 0.0 standard drinks     Current Outpatient Medications:    aspirin 81 MG EC tablet, Take 81 mg by mouth daily., Disp: , Rfl: 5   atorvastatin (LIPITOR) 20 MG tablet, Take 1 tablet (20 mg total) by mouth daily at 6 PM. New dose, Disp: 90 tablet, Rfl: 1   azelastine (ASTELIN) 0.1 % nasal spray, Place 2 sprays into both nostrils 2 (two) times daily. Use in each nostril as directed, Disp: 30 mL, Rfl: 2   benzonatate (TESSALON) 100 MG capsule, Take 1-2 capsules (100-200 mg total) by mouth 2 (two) times daily as needed., Disp: 40  capsule, Rfl: 0   blood glucose meter kit and supplies, Dispense based on patient and insurance preference. Use up to four times daily as directed. (FOR ICD-10 E10.9, E11.9)., Disp: 1 each, Rfl: 0   docusate sodium (COLACE) 100 MG capsule, Take 100 mg by mouth daily., Disp: , Rfl:    ibuprofen (ADVIL) 600 MG tablet, Take 1 tablet (600 mg total) by mouth every 6 (six) hours as needed., Disp: 30 tablet, Rfl: 0   Insulin Lispro Prot & Lispro (HUMALOG MIX 75/25 KWIKPEN) (75-25) 100 UNIT/ML Kwikpen, INJECT INTO THE SKIN 2 (TWO) TIMES DAILY WITH A MEAL. 30 UNITS IN AM AND 25 UNITS BEFORE DINNER, Disp: 15 mL, Rfl: 0   Insulin Pen Needle 31G X 6 MM MISC, 1 each by Does not apply route 3 (three) times daily as needed., Disp: 100 each, Rfl: 1   ipratropium (ATROVENT) 0.06 % nasal spray, Place 2 sprays into both nostrils 4 (four) times daily., Disp: 15 mL, Rfl: 12   Lancets (ONETOUCH DELICA PLUS [id number]) MISC, , Disp: , Rfl:    losartan (COZAAR) 100 MG tablet, Take 1 tablet (100 mg total) by mouth daily., Disp: 90 tablet, Rfl: 0   ONETOUCH ULTRA test strip, TEST UP TO 4 TIMES A DAY, Disp: 100 strip, Rfl: 0   OZEMPIC, 0.25 OR 0.5 MG/DOSE, 2 MG/1.5ML SOPN, INJECT 0.5 MG INTO THE SKIN ONCE A WEEK., Disp: 6 mL, Rfl: 0   pioglitazone (ACTOS) 15 MG tablet, Take 1 tablet (15 mg total) by mouth daily., Disp: 90 tablet, Rfl: 1   pregabalin (LYRICA) 150 MG capsule, Take 1 capsule (150 mg total) by mouth at bedtime., Disp: 90 capsule, Rfl: 0   tiZANidine (ZANAFLEX) 2 MG tablet, TAKE 1 TABLET BY MOUTH EVERY DAY IN THE EVENING, Disp: 90 tablet, Rfl: 0  No Known Allergies  I personally reviewed active problem list, medication list, allergies, family history, social history, health maintenance with the patient/caregiver today.   ROS  Constitutional: Negative for fever or weight change.  Respiratory: Negative for cough and shortness of breath.   Cardiovascular: Negative for chest pain or palpitations.   Gastrointestinal: Negative for abdominal pain, no bowel changes.  Musculoskeletal: Negative for gait problem or joint swelling.  Skin: Negative for rash.  Neurological: Negative for dizziness or headache.  No other specific complaints in a complete review of systems (except as listed in HPI above).   Objective  Vitals:  05/25/21 1054  BP: (!) 142/70  Pulse: 67  Resp: 16  Temp: 97.9 F (36.6 C)  SpO2: 94%  Weight: 174 lb (78.9 kg)  Height: 5' 7" (1.702 m)    Body mass index is 27.25 kg/m.  Physical Exam  Constitutional: Patient appears well-developed and malnourished  No distress.  HEENT: head atraumatic, normocephalic, pupils equal and reactive to light, neck supple Cardiovascular: Normal rate, regular rhythm and normal heart sounds.  No murmur heard. No BLE edema. Pulmonary/Chest: Effort normal and breath sounds normal. No respiratory distress. Abdominal: Soft.  There is no tenderness. Psychiatric: Patient has a normal mood and affect. behavior is normal. Judgment and thought content normal.   Recent Results (from the past 2160 hour(s))  RPR     Status: None   Collection Time: 05/08/21  2:06 PM  Result Value Ref Range   RPR Ser Ql NON-REACTIVE NON-REACTIVE  B12 and Folate Panel     Status: None   Collection Time: 05/08/21  2:06 PM  Result Value Ref Range   Vitamin B-12 432 200 - 1,100 pg/mL   Folate 10.6 ng/mL    Comment:                            Reference Range                            Low:           <3.4                            Borderline:    3.4-5.4                            Normal:        >5.4 .   CBC with Differential/Platelet     Status: Abnormal   Collection Time: 05/08/21  2:06 PM  Result Value Ref Range   WBC 4.1 3.8 - 10.8 Thousand/uL   RBC 4.55 4.20 - 5.80 Million/uL   Hemoglobin 13.1 (L) 13.2 - 17.1 g/dL   HCT 40.2 38.5 - 50.0 %   MCV 88.4 80.0 - 100.0 fL   MCH 28.8 27.0 - 33.0 pg   MCHC 32.6 32.0 - 36.0 g/dL   RDW 12.8 11.0 - 15.0 %    Platelets 284 140 - 400 Thousand/uL   MPV 11.0 7.5 - 12.5 fL   Neutro Abs 2,267 1,500 - 7,800 cells/uL   Lymphs Abs 1,337 850 - 3,900 cells/uL   Absolute Monocytes 377 200 - 950 cells/uL   Eosinophils Absolute 98 15 - 500 cells/uL   Basophils Absolute 21 0 - 200 cells/uL   Neutrophils Relative % 55.3 %   Total Lymphocyte 32.6 %   Monocytes Relative 9.2 %   Eosinophils Relative 2.4 %   Basophils Relative 0.5 %  COMPLETE METABOLIC PANEL WITH GFR     Status: Abnormal   Collection Time: 05/08/21  2:06 PM  Result Value Ref Range   Glucose, Bld 240 (H) 65 - 99 mg/dL    Comment: .            Fasting reference interval . For someone without known diabetes, a glucose value >125 mg/dL indicates that they may have diabetes and this should be confirmed with a follow-up test. .    BUN  11 7 - 25 mg/dL   Creat 0.98 0.70 - 1.28 mg/dL   eGFR 80 > OR = 60 mL/min/1.101m    Comment: The eGFR is based on the CKD-EPI 2021 equation. To calculate  the new eGFR from a previous Creatinine or Cystatin C result, go to https://www.kidney.org/professionals/ kdoqi/gfr%5Fcalculator    BUN/Creatinine Ratio NOT APPLICABLE 6 - 22 (calc)   Sodium 141 135 - 146 mmol/L   Potassium 3.8 3.5 - 5.3 mmol/L   Chloride 101 98 - 110 mmol/L   CO2 33 (H) 20 - 32 mmol/L   Calcium 9.7 8.6 - 10.3 mg/dL   Total Protein 6.5 6.1 - 8.1 g/dL   Albumin 3.6 3.6 - 5.1 g/dL   Globulin 2.9 1.9 - 3.7 g/dL (calc)   AG Ratio 1.2 1.0 - 2.5 (calc)   Total Bilirubin 0.7 0.2 - 1.2 mg/dL   Alkaline phosphatase (APISO) 69 35 - 144 U/L   AST 14 10 - 35 U/L   ALT 15 9 - 46 U/L  TSH     Status: None   Collection Time: 05/08/21  2:06 PM  Result Value Ref Range   TSH 1.51 0.40 - 4.50 mIU/L  POCT HgB A1C     Status: Abnormal   Collection Time: 05/25/21 11:05 AM  Result Value Ref Range   Hemoglobin A1C 11.0 (A) 4.0 - 5.6 %   HbA1c POC (<> result, manual entry)     HbA1c, POC (prediabetic range)     HbA1c, POC (controlled diabetic  range)       PHQ2/9: Depression screen [facility] 05/25/2021 05/08/2021 05/07/2021 02/25/2021 02/18/2021  Decreased Interest 0 0 0 0 0  Down, Depressed, Hopeless 0 0 0 0 0  PHQ - 2 Score 0 0 0 0 0  Altered sleeping 0 0 0 0 0  Tired, decreased energy 0 0 0 0 0  Change in appetite 0 0 0 0 3  Feeling bad or failure about yourself  0 0 0 0 0  Trouble concentrating 0 0 0 0 0  Moving slowly or fidgety/restless 0 0 0 0 0  Suicidal thoughts 0 0 0 0 0  PHQ-9 Score 0 0 0 0 3  Difficult doing work/chores - - - Not difficult at all -  Some recent data might be hidden    phq 9 is negative   Fall Risk: Fall Risk  05/25/2021 05/08/2021 05/07/2021 02/25/2021 02/18/2021  Falls in the past year? 1 0 0 0 0  Number falls in past yr: 1 0 0 0 0  Injury with Fall? 0 0 0 0 0  Risk for fall due to : Impaired balance/gait No Fall Risks No Fall Risks - No Fall Risks  Follow up Falls prevention discussed Falls prevention discussed Falls prevention discussed - Falls prevention discussed      Functional Status Survey: Is the patient deaf or have difficulty hearing?: Yes Does the patient have difficulty seeing, even when wearing glasses/contacts?: No Does the patient have difficulty concentrating, remembering, or making decisions?: Yes Does the patient have difficulty walking or climbing stairs?: No Does the patient have difficulty dressing or bathing?: No Does the patient have difficulty doing errands alone such as visiting a doctor's office or shopping?: No    Assessment & Plan  1. Uncontrolled type 2 diabetes mellitus with hyperglycemia (HCC)  - POCT HgB A1C - AMB Referral to [facility]- Insulin Lispro Prot & Lispro (HUMALOG MIX 75/25 KWIKPEN) (75-25) 100 UNIT/ML Kwikpen; [employer]  INTO THE SKIN 2 (TWO) TIMES DAILY WITH A MEAL. 30 UNITS IN AM AND 25 UNITS BEFORE DINNER  Dispense: 15 mL; Refill: 2 - Semaglutide,0.25 or 0.5MG/DOS, (OZEMPIC, 0.25 OR 0.5 MG/DOSE,) 2 MG/1.5ML SOPN; Inject 0.5 mg  into the skin once a week.  Dispense: 6 mL; Refill: 0 - Ambulatory referral to [place] - Ambulatory referral to Endocrinology  2. Mild protein-calorie malnutrition ([place])   3. Atherosclerosis of aorta (HCC)  - AMB Referral to [facility] Care Coordin[facility] - Insulin Lispro Prot & Lispro (HUMALOG MIX 75/25 KWIKPEN) (75-25) 100 UNIT/ML Kwikpen; INJECT INTO THE SKIN 2 (TWO) TIMES DAILY WITH A MEAL. 30 UNITS IN AM AND 25 UNITS BEFORE DINNER  Dispense: 15 mL; Refill: 2  4. Hypertension associated with type 2 diabetes mellitus (HCC)  - pioglitazone (ACTOS) 15 MG tablet; Take 1 tablet (15 mg total) by mouth daily.  Dispense: 90 tablet; Refill: 1  5. Cognitive dysfunction  - AMB Referral to [place] - Ambulatory referral to Home Health  6. Vitamin D deficiency   7. Dyslipidemia  - atorvastatin (LIPITOR) 20 MG tablet; Take 1 tablet (20 mg total) by mouth daily at 6 PM. New dose  Dispense: 90 tablet; Refill: 1  8. Hyperlipidemia associated with type 2 diabetes mellitus (HCC)  - Insulin Lispro Prot & Lispro (HUMALOG MIX 75/25 KWIKPEN) (75-25) 100 UNIT/ML Kwikpen; INJECT INTO THE SKIN 2 (TWO) TIMES DAILY WITH A MEAL. 30 UNITS IN AM AND 25 UNITS BEFORE DINNER  Dispense: 15 mL; Refill: 2 - pioglitazone (ACTOS) 15 MG tablet; Take 1 tablet (15 mg total) by mouth daily.  Dispense: 90 tablet; Refill: 1  9. Essential hypertension  - losartan (COZAAR) 100 MG tablet; Take 1 tablet (100 mg total) by mouth daily.  Dispense: 90 tablet; Refill: 0  10. Intermittent low back pain  - pregabalin (LYRICA) 150 MG capsule; Take 1 capsule (150 mg total) by mouth at bedtime.  Dispense: 90 capsule; Refill: 0 - tiZANidine (ZANAFLEX) 2 MG tablet; TAKE 1 TABLET BY MOUTH EVERY DAY IN THE EVENING  Dispense: 90 tablet; Refill: 0  11. 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

## 2021-05-25 ENCOUNTER — Ambulatory Visit (INDEPENDENT_AMBULATORY_CARE_PROVIDER_SITE_OTHER): Payer: Medicare Other | Admitting: Family Medicine

## 2021-05-25 ENCOUNTER — Other Ambulatory Visit: Payer: Self-pay

## 2021-05-25 ENCOUNTER — Encounter: Payer: Self-pay | Admitting: Family Medicine

## 2021-05-25 VITALS — BP 142/70 | HR 67 | Temp 97.9°F | Resp 16 | Ht 67.0 in | Wt 174.0 lb

## 2021-05-25 DIAGNOSIS — E1165 Type 2 diabetes mellitus with hyperglycemia: Secondary | ICD-10-CM | POA: Diagnosis not present

## 2021-05-25 DIAGNOSIS — I10 Essential (primary) hypertension: Secondary | ICD-10-CM

## 2021-05-25 DIAGNOSIS — E1159 Type 2 diabetes mellitus with other circulatory complications: Secondary | ICD-10-CM | POA: Diagnosis not present

## 2021-05-25 DIAGNOSIS — I700 Atherosclerosis of aorta: Secondary | ICD-10-CM | POA: Diagnosis not present

## 2021-05-25 DIAGNOSIS — F09 Unspecified mental disorder due to known physiological condition: Secondary | ICD-10-CM

## 2021-05-25 DIAGNOSIS — I152 Hypertension secondary to endocrine disorders: Secondary | ICD-10-CM

## 2021-05-25 DIAGNOSIS — E1169 Type 2 diabetes mellitus with other specified complication: Secondary | ICD-10-CM

## 2021-05-25 DIAGNOSIS — E785 Hyperlipidemia, unspecified: Secondary | ICD-10-CM

## 2021-05-25 DIAGNOSIS — E441 Mild protein-calorie malnutrition: Secondary | ICD-10-CM

## 2021-05-25 DIAGNOSIS — E559 Vitamin D deficiency, unspecified: Secondary | ICD-10-CM

## 2021-05-25 DIAGNOSIS — M545 Low back pain, unspecified: Secondary | ICD-10-CM

## 2021-05-25 DIAGNOSIS — E1143 Type 2 diabetes mellitus with diabetic autonomic (poly)neuropathy: Secondary | ICD-10-CM | POA: Insufficient documentation

## 2021-05-25 LAB — POCT GLYCOSYLATED HEMOGLOBIN (HGB A1C): Hemoglobin A1C: 11 % — AB (ref 4.0–5.6)

## 2021-05-25 MED ORDER — DOCUSATE SODIUM 100 MG PO CAPS: 100.0000 mg | ORAL_CAPSULE | ORAL | 5 refills | Status: DC

## 2021-05-25 MED ORDER — OZEMPIC (0.25 OR 0.5 MG/DOSE) 2 MG/1.5ML ~~LOC~~ SOPN: 0.5000 mg | PEN_INJECTOR | SUBCUTANEOUS | 0 refills | Status: DC

## 2021-05-25 MED ORDER — INSULIN LISPRO PROT & LISPRO (75-25 MIX) 100 UNIT/ML KWIKPEN: PEN_INJECTOR | SUBCUTANEOUS | 2 refills | Status: DC

## 2021-05-25 MED ORDER — INSULIN PEN NEEDLE 31G X 6 MM MISC: 1.0000 | Freq: Three times a day (TID) | 1 refills | Status: DC | PRN

## 2021-05-25 MED ORDER — PREGABALIN 150 MG PO CAPS: 150.0000 mg | ORAL_CAPSULE | Freq: Every day | ORAL | 0 refills | Status: DC

## 2021-05-25 MED ORDER — ATORVASTATIN CALCIUM 20 MG PO TABS: 20.0000 mg | ORAL_TABLET | Freq: Every day | ORAL | 1 refills | Status: DC

## 2021-05-25 MED ORDER — PIOGLITAZONE HCL 15 MG PO TABS: 15.0000 mg | ORAL_TABLET | Freq: Every day | ORAL | 1 refills | Status: DC

## 2021-05-25 MED ORDER — LOSARTAN POTASSIUM 100 MG PO TABS: 100.0000 mg | ORAL_TABLET | Freq: Every day | ORAL | 0 refills | Status: DC

## 2021-05-25 MED ORDER — TIZANIDINE HCL 2 MG PO TABS: ORAL_TABLET | ORAL | 0 refills | Status: DC

## 2021-05-26 ENCOUNTER — Telehealth: Payer: Self-pay | Admitting: *Deleted

## 2021-05-26 NOTE — Chronic Care Management (AMB) (Signed)
°  Chronic Care Management   Outreach Note  05/26/2021 Name: William Parks MRN: [id number] DOB: 07-17-1945  William Parks is a 76 y.o. year old male who is a primary care patient of Alba Cory, MD. I reached out to Isaias Sakai by phone today in response to a referral sent by Mr. Beatrice Sehgal primary care provider.  An unsuccessful telephone outreach was attempted today. The patient was referred to the case management team for assistance with care management and care coordination.   Follow Up Plan: A HIPAA compliant phone message was left for the patient providing contact information and requesting a return call.  If patient returns call to provider office, please advise to call Embedded Care Management Care Guide Duvan Mousel at [phone]  Burman Nieves, CCMA Care Guide, Embedded Care Coordination [facility]   Care Management  Direct Dial: [phone]

## 2021-05-27 ENCOUNTER — Other Ambulatory Visit: Payer: Self-pay

## 2021-05-27 ENCOUNTER — Telehealth: Payer: Self-pay

## 2021-05-27 DIAGNOSIS — E1165 Type 2 diabetes mellitus with hyperglycemia: Secondary | ICD-10-CM

## 2021-05-27 MED ORDER — BLOOD GLUCOSE METER KIT: 1.0000 | PACK | Freq: Two times a day (BID) | 0 refills | Status: DC | PRN

## 2021-05-27 NOTE — Telephone Encounter (Signed)
Copied from CRM [id number]. Topic: General - Other >> May 27, 2021 11:09 AM Gaetana Michaelis A wrote: Reason for CRM: The patient's daughter would like a prescription for a one touch diabetic testing meter submitted to their preferred pharmacy   [id number] [place], [place] - [address] AT [facility] OF [address] [place] [address] Phone: [phone] Fax: [phone]  Please contact further when possible

## 2021-05-28 NOTE — Chronic Care Management (AMB) (Signed)
Chronic Care Management   Note  05/28/2021 Name: Sharron Simpson MRN: [id number] DOB: Jun 04, 1945  Chee Kinslow is a 76 y.o. year old male who is a primary care patient of Steele Sizer, MD. I reached out to [name] by phone today in response to a referral sent by Mr. Cornell Bourbon PCP.  Mr. Witz was given information about Chronic Care Management services today including:  CCM service includes personalized support from designated clinical staff supervised by his physician, including individualized plan of care and coordination with other care providers 24/7 contact phone numbers for assistance for urgent and routine care needs. Service will only be billed when office clinical staff spend 20 minutes or more in a month to coordinate care. Only one practitioner may furnish and bill the service in a calendar month. The patient may stop CCM services at any time (effective at the end of the month) by phone call to the office staff. The patient is responsible for co-pay (up to 20% after annual deductible is met) if co-pay is required by the individual health plan.   Patient agreed to services and verbal consent obtained.   Follow up plan: Telephone appointment with care management team member scheduled for: 05/29/2021 - pt daughter says pt has home health and declined to schedule with RNCM   Julian Hy, [place] Management  Direct Dial: [phone]

## 2021-05-29 ENCOUNTER — Ambulatory Visit (INDEPENDENT_AMBULATORY_CARE_PROVIDER_SITE_OTHER): Payer: Medicare Other | Admitting: *Deleted

## 2021-05-29 DIAGNOSIS — F09 Unspecified mental disorder due to known physiological condition: Secondary | ICD-10-CM

## 2021-05-29 DIAGNOSIS — E1159 Type 2 diabetes mellitus with other circulatory complications: Secondary | ICD-10-CM

## 2021-05-29 DIAGNOSIS — E1165 Type 2 diabetes mellitus with hyperglycemia: Secondary | ICD-10-CM

## 2021-05-29 DIAGNOSIS — I152 Hypertension secondary to endocrine disorders: Secondary | ICD-10-CM

## 2021-05-29 NOTE — Chronic Care Management (AMB) (Signed)
°  Care Management   Follow Up Note   05/29/2021 Name: Lenzie Sandler MRN: [id number] DOB: [birth year]   Referred by: Alba Cory, MD Reason for referral : Uncontrolled type 2 diabetes mellitus with hyperglycemia (HCC)   Successful contact was made with the patient to discuss care management and care coordination services. Patient declines engagement at this time.   Spoke with patient's daughter Elnita Maxwell, [place] services explained. Patient's daughter verbalized having no community resource needs at this time. Per patient's daughter, they will be having a family meeting next week and if any assistance needs arise she will give this social worker a call. Patient currently receiving home health services at this time. Medications are on home delivery.  Follow Up Plan:  Client's daughter will contact this social worker with any additional community resource needs   Direct contact number provided.  Verna Czech, LCSW [facility] Care Management [phone]

## 2021-06-01 ENCOUNTER — Other Ambulatory Visit: Payer: Self-pay

## 2021-06-01 ENCOUNTER — Telehealth: Payer: Self-pay

## 2021-06-01 ENCOUNTER — Other Ambulatory Visit: Payer: Self-pay | Admitting: Family Medicine

## 2021-06-01 DIAGNOSIS — E1169 Type 2 diabetes mellitus with other specified complication: Secondary | ICD-10-CM

## 2021-06-01 DIAGNOSIS — I700 Atherosclerosis of aorta: Secondary | ICD-10-CM

## 2021-06-01 DIAGNOSIS — E785 Hyperlipidemia, unspecified: Secondary | ICD-10-CM

## 2021-06-01 DIAGNOSIS — E1165 Type 2 diabetes mellitus with hyperglycemia: Secondary | ICD-10-CM

## 2021-06-01 MED ORDER — INSULIN LISPRO PROT & LISPRO (75-25 MIX) 100 UNIT/ML KWIKPEN: PEN_INJECTOR | SUBCUTANEOUS | 2 refills | Status: DC

## 2021-06-01 NOTE — Telephone Encounter (Signed)
Pt said that he lost his medication and then found it and has taking the last one but needs his meds called into [facility]  [address]. He use to use [facility] but no longer. Said that they told him to get [place] to transfer it to [employer] [address].

## 2021-06-01 NOTE — Telephone Encounter (Signed)
Sorry his medication is Hummalog

## 2021-06-05 ENCOUNTER — Other Ambulatory Visit: Payer: Self-pay

## 2021-06-05 ENCOUNTER — Telehealth: Payer: Medicare Other

## 2021-06-05 DIAGNOSIS — E1165 Type 2 diabetes mellitus with hyperglycemia: Secondary | ICD-10-CM

## 2021-06-05 MED ORDER — BLOOD GLUCOSE METER KIT: 1.0000 | PACK | Freq: Two times a day (BID) | 0 refills | Status: DC | PRN

## 2021-06-10 ENCOUNTER — Other Ambulatory Visit: Payer: Self-pay

## 2021-06-10 DIAGNOSIS — E1165 Type 2 diabetes mellitus with hyperglycemia: Secondary | ICD-10-CM

## 2021-06-10 MED ORDER — BLOOD GLUCOSE METER KIT: 1.0000 | PACK | Freq: Two times a day (BID) | 0 refills | Status: AC | PRN

## 2021-06-11 ENCOUNTER — Telehealth: Payer: Self-pay | Admitting: *Deleted

## 2021-06-11 NOTE — Chronic Care Management (AMB) (Signed)
°  Chronic Care Management   Note  06/11/2021 Name: William Parks MRN: [id number] DOB: [birth year]  William Parks is a 76 y.o. year old male who is a primary care patient of Steele Sizer, MD. William Parks is currently enrolled in care management services. An additional referral for Pharm D was placed.   Follow up plan: Telephone appointment with care management team member scheduled for: 07/22/2021  Julian Hy, [place] [facility]  Direct Dial: [phone]

## 2021-06-19 ENCOUNTER — Telehealth: Payer: Self-pay

## 2021-06-19 NOTE — Telephone Encounter (Signed)
Copied from [place] [id number]. Topic: Quick Communication - Home Health Verbal Orders >> Jun 19, 2021  2:49 PM McGill, Nelva Bush wrote: Caller/Agency: [facility] Number: [phone] Requesting OT/PT/Skilled Nursing/Social Work/Speech Therapy: Medication Management Education/Diabetes Frequency: 1W6

## 2021-06-22 NOTE — Telephone Encounter (Signed)
Verbal orders  

## 2021-06-23 DIAGNOSIS — I152 Hypertension secondary to endocrine disorders: Secondary | ICD-10-CM

## 2021-06-23 DIAGNOSIS — E1165 Type 2 diabetes mellitus with hyperglycemia: Secondary | ICD-10-CM

## 2021-06-23 DIAGNOSIS — E1159 Type 2 diabetes mellitus with other circulatory complications: Secondary | ICD-10-CM

## 2021-07-16 ENCOUNTER — Telehealth: Payer: Self-pay

## 2021-07-16 NOTE — Progress Notes (Signed)
? ? ?Chronic Care Management ?Pharmacy Assistant  ? ?Name: William Parks  MRN: [id number] DOB: 07/24/1945 ? ?Initial Visit with Junius Argyle, CPP on 07/22/2021 @ 1300 ?Initial Visit Assessment ? ?Conditions to be addressed/monitored: ?HTN, DMII, and Atherosclerosis of aorta, Dyslipidemia,  ? ?Primary concerns for visit include: ?Spoke with the patient's daughter and she stated she has no primary concerns at this time. The daughter [name] lives in [place] and is trying to coordinate care for her dad from there. She will be in town on April 12th.  ? ?Recent office visits:  ?05/25/2021 Steele Sizer, MD (PCP Office Visit) for Follow-up- Stopped: Ibuprofen 600 mg, Ipratropium Bromide due to patient completed course, Changed: Docusate Sodium to 100 mg every other day, lab order placed, Referral to [place], Referral to Endocrinology placed, No follow-up noted ? ?05/08/2021 Steele Sizer, MD (PCP Office Visit) for Memory Test- No medication changes noted, Labor orders placed, Referral to Neurology placed, No follow-up noted ? ?05/07/2021 Steele Sizer, MD (PCP Office Visit) for URI- Started: Azelastine HCL 0.1% use twice daily, Benzonatate 100-200 mg twice daily prn, Stopped: Methylprednisolone 4 mg (Patient completed Course) No orders placed, No follow-up noted ? ?02/25/2021 Rory Percy, DO (PCP Office Visit) for Rash- Started: Methylprednisolone 4 mg taper, No orders placed, No follow-up noted ? ?02/18/2021 Steele Sizer, MD (PCP Office Visit) for Follow-up- Started: Pioglitazone HCl 15 mg daily, Stopped: Benzonatate 200 mg (completed course), Metoprolol Tartrate 25 mg (patient not taking), Promethazine DM 6.25-15 (completed course), Changed: Atorvastatin 10 mg daily increased to 20 mg daily., Lab orders placed, Patient to follow-up in 3 months ? ?Recent consult visits:  ?None ID ? ?Hospital visits:  ?None in previous 6 months ? ?Medications: ?Outpatient Encounter Medications as of 07/16/2021   ?Medication Sig  ? aspirin 81 MG EC tablet Take 81 mg by mouth daily.  ? atorvastatin (LIPITOR) 20 MG tablet Take 1 tablet (20 mg total) by mouth daily at 6 PM. New dose  ? azelastine (ASTELIN) 0.1 % nasal spray Place 2 sprays into both nostrils 2 (two) times daily. Use in each nostril as directed  ? benzonatate (TESSALON) 100 MG capsule Take 1-2 capsules (100-200 mg total) by mouth 2 (two) times daily as needed.  ? blood glucose meter kit and supplies 1 each by Other route 2 (two) times daily as needed for other. Dispense based on patient and insurance preference. Use up to four times daily as directed. (FOR ICD-10 E10.9, E11.9). ?Uncontrolled DM  ? docusate sodium (COLACE) 100 MG capsule Take 1 capsule (100 mg total) by mouth every other day.  ? Insulin Lispro Prot & Lispro (HUMALOG MIX 75/25 KWIKPEN) (75-25) 100 UNIT/ML Kwikpen INJECT INTO THE SKIN 2 (TWO) TIMES DAILY WITH A MEAL. 30 UNITS IN AM AND 25 UNITS BEFORE DINNER  ? Insulin Pen Needle 31G X 6 MM MISC 1 each by Does not apply route 3 (three) times daily as needed.  ? Lancets (ONETOUCH DELICA PLUS [id number]) MISC   ? losartan (COZAAR) 100 MG tablet Take 1 tablet (100 mg total) by mouth daily.  ? ONETOUCH ULTRA test strip TEST UP TO 4 TIMES A DAY  ? pioglitazone (ACTOS) 15 MG tablet Take 1 tablet (15 mg total) by mouth daily.  ? pregabalin (LYRICA) 150 MG capsule Take 1 capsule (150 mg total) by mouth at bedtime.  ? Semaglutide,0.25 or 0.5MG/DOS, (OZEMPIC, 0.25 OR 0.5 MG/DOSE,) 2 MG/1.5ML SOPN Inject 0.5 mg into the skin once a week.  ? tiZANidine (ZANAFLEX) 2  MG tablet TAKE 1 TABLET BY MOUTH EVERY DAY IN THE EVENING  ? ?No facility-administered encounter medications on file as of 07/16/2021.  ? ?Care Gaps: ?Zoster Vaccine ?COVID-19 Vaccine Booster 3 ?Diabetic Eye Exam ?Diabetic Foot Exam ?BP > 140/90 ?A1C > 9 ? ?Star Rating Drugs: ?Atorvastatin 20 mg last filled on 07/08/2021 for a 30-Day supply with [facility] ?Losartan 100 mg last filled on  07/08/2021 for a 30-Day supply with [facility] ?Pioglitazone 15 mg last filled on 07/08/2021 for a 30-Day supply with [facility] ? ?Questions for Clinical Pharmacist:  ? ?1.Are you able to connect with Patient  ? ?I was able to contact his primary number which is the patient's daughter number [name] who is on HIPPA. [name] lives in [place], and is trying to assist her father in his care from there. She did advise she would be in town on April 12th if you would like to meet with them. ?   ?2.Confirmed appointment date/time with patient/caregiver?  ? ?Confirm appointment on 07/22/2021 at 1300 with Daron Offer CPP ?  ?3.Visit type telephone ?   ?4.Patient/Caregiver instructed to bring medications to appointment.  ? ?Patient is aware to bring all medications and supplements to appointment you will be speaking mostly to the patient's daughter and she is not in town. She stated she would do her best ?  ?5.What, if any, problems do you have getting your medications from the pharmacy?  ? ?Per the patient's daughter she is unsure if the patient has any financial barriers with any of his medications and she stated the patient would not know either. ?  ?6.What is your top health concern to discuss at your upcoming visit?  ? ?The daughter is unsure why the patient was referred so at this time she has no concerns. The patient does have [place] for the moment, but she does want to make sure his medications are correct. She is aware that his A1C is elevated but stated that since [place] has been involved his daily numbers are better. ?  ?7.Have you seen any other providers since your last visit? No ? ?Any changes in your medications or health? no ? ?Any side effects from any medications? no ? ?Do you have an symptoms or problems not managed by your medications? The daughter is not sure at this moment ? ?Has your provider asked that you check blood pressure, blood sugar, or follow special diet at home?  Per the daughter he is suppose to be doing a diabetic diet as well as watching his sodium. Since Home Health is currently in the picture his blood sugars are being taken daily. ? ?Do you get any type of exercise on a regular basis? Per the daughter not as much as the patient should  ? ? ?Lynann Bologna, CPA/CMA ?Clinical Pharmacist Assistant ?Phone: [phone]  ? ? ?

## 2021-07-22 ENCOUNTER — Ambulatory Visit (INDEPENDENT_AMBULATORY_CARE_PROVIDER_SITE_OTHER): Payer: Medicare Other

## 2021-07-22 DIAGNOSIS — E1169 Type 2 diabetes mellitus with other specified complication: Secondary | ICD-10-CM

## 2021-07-22 DIAGNOSIS — I10 Essential (primary) hypertension: Secondary | ICD-10-CM

## 2021-07-22 DIAGNOSIS — E1143 Type 2 diabetes mellitus with diabetic autonomic (poly)neuropathy: Secondary | ICD-10-CM

## 2021-07-22 NOTE — Patient Instructions (Signed)
Visit Information ?It was great speaking with you today!  Please let me know if you have any questions about our visit. ? ? Goals Addressed   ? ?  ?  ?  ?  ? This Visit's Progress  ?  Monitor and Manage My Blood Sugar-Diabetes Type 2     ?  Timeframe:  Long-Range Goal ?Priority:  High ?Start Date: 07/22/21                            ?Expected End Date: 07/23/22                     ? ?Follow Up within 90 days ?  ?- check blood sugar at prescribed times ?- check blood sugar if I feel it is too high or too low ?- enter blood sugar readings and medication or insulin into daily log ?- take the blood sugar log to all doctor visits  ?  ?Why is this important?   ?Checking your blood sugar at home helps to keep it from getting very high or very low.  ?Writing the results in a diary or log helps the doctor know how to care for you.  ?Your blood sugar log should have the time, date and the results.  ?Also, write down the amount of insulin or other medicine that you take.  ?Other information, like what you ate, exercise done and how you were feeling, will also be helpful.   ?  ?Notes:  ?  ? ?  ? ? ?Patient Care Plan: General Pharmacy (Adult)  ?  ? ?Problem Identified: Hypertension, Hyperlipidemia, and Diabetes   ?Priority: High  ?  ? ?Long-Range Goal: Patient-Specific Goal   ?Start Date: 07/22/2021  ?Expected End Date: 07/23/2022  ?This Visit's Progress: On track  ?Priority: High  ?Note:   ?Current Barriers:  ?Unable to achieve control of diabetes  ? ?Pharmacist Clinical Goal(s):  ?Patient will achieve control of diabetes as evidenced by A1c less than 8% through collaboration with PharmD and provider.  ? ?Interventions: ?1:1 collaboration with Steele Sizer, MD regarding development and update of comprehensive plan of care as evidenced by provider attestation and co-signature ?Inter-disciplinary care team collaboration (see longitudinal plan of care) ?Comprehensive medication review performed; medication list updated in  electronic medical record ? ?Hypertension (BP goal <130/80) ?-Uncontrolled ?-Current treatment: ?Losartan 100 mg daily: Appropriate, Query effective ?-Medications previously tried: NA  ?-Current home readings: Patient's daughter not with patient, did not have logbook available.  ?-Denies hypotensive/hypertensive symptoms ?-Recommended to continue current medication ? ?Hyperlipidemia: (LDL goal < 70) ?-Uncontrolled ?-History of atherosclerosis  ?-Current treatment: ?Atorvastatin 20 mg daily: Appropriate, Query effective ?-Current treatment: ?Aspirin 81 mg daily: Appropriate, Effective, Safe, Accessible  ?-Medications previously tried: NA  ?-10-year ASVCD risk very high  ?-Recommend increasing atorvastatin to 40 mg daily  ? ?Diabetes (A1c goal <8%) ?-Uncontrolled ?-Current medications: ?Humalog 75/25 30 units AM, 25 units PM: Appropriate, Query effective ?Pioglitazone 15 mg daily: Appropriate, Query effective ?Ozempic 0.5 mg weekly: Appropriate, Query effective ?-Medications previously tried: NA  ?-Current home glucose readings ?fasting glucose: Patient daughter not with patient, did not have logbook available.  ?-Denies hypoglycemic/hyperglycemic symptoms ?-Defer medication change recommendations until follow-up labwork, review of home glucose readings.  ?-Recommended to continue current medication ? ?Patient Goals/Self-Care Activities ?Patient will:  ?- check glucose twice daily before breakfast and before supper, document, and provide at future appointments ?check blood pressure 2-3 times weekly,  document, and provide at future appointments ? ?Follow Up Plan: Telephone follow up appointment with care management team member scheduled for:  09/02/2021 at 2:30 PM ?  ? ?William Parks was given information about Chronic Care Management services today including:  ?CCM service includes personalized support from designated clinical staff supervised by his physician, including individualized plan of care and coordination with  other care providers ?24/7 contact phone numbers for assistance for urgent and routine care needs. ?Standard insurance, coinsurance, copays and deductibles apply for chronic care management only during months in which we provide at least 20 minutes of these services. Most insurances cover these services at 100%, however patients may be responsible for any copay, coinsurance and/or deductible if applicable. This service may help you avoid the need for more expensive face-to-face services. ?Only one practitioner may furnish and bill the service in a calendar month. ?The patient may stop CCM services at any time (effective at the end of the month) by phone call to the office staff. ? ?Patient agreed to services and verbal consent obtained.  ? ?The patient verbalized understanding of instructions, educational materials, and care plan provided today and declined offer to receive copy of patient instructions, educational materials, and care plan.  ? ?Doristine Section, BCACP, CPP ?Clinical Pharmacist Practitioner  ?[facility] ?[phone]  ?

## 2021-07-22 NOTE — Progress Notes (Signed)
? ?Chronic Care Management ?Pharmacy Note ? ?07/22/2021 ?Name:  William Parks MRN:  [id number] DOB:  Sep 11, 1945 ? ?Summary: ?Patient presents for initial CCM consult. Today's visit was 100% conducted with [name], patient's daughter who helps manage her father's medicines despite living in [place]. She did not have his medication list or blood sugar logs with her today.  ? ?Recommendations/Changes made from today's visit: ?-Defer medication change recommendations until follow-up labwork, review of home glucose readings.  ?-Recommended to continue current medication ? ?Plan: ?CPP follow-up 1 month after PCP follow-up. ? ?Subjective: ?William Parks is an 77 y.o. year old male who is a primary patient of Steele Sizer, MD.  The CCM team was consulted for assistance with disease management and care coordination needs.   ? ?Engaged with patient by telephone for initial visit in response to provider referral for pharmacy case management and/or care coordination services.  ? ?Consent to Services:  ?The patient was given the following information about Chronic Care Management services today, agreed to services, and gave verbal consent: 1. CCM service includes personalized support from designated clinical staff supervised by the primary care provider, including individualized plan of care and coordination with other care providers 2. 24/7 contact phone numbers for assistance for urgent and routine care needs. 3. Service will only be billed when office clinical staff spend 20 minutes or more in a month to coordinate care. 4. Only one practitioner may furnish and bill the service in a calendar month. 5.The patient may stop CCM services at any time (effective at the end of the month) by phone call to the office staff. 6. The patient will be responsible for cost sharing (co-pay) of up to 20% of the service fee (after annual deductible is met). Patient agreed to services and consent obtained. ? ?Patient Care Team: ?Steele Sizer, MD as PCP - General (Family Medicine) ?[place], [place], [place] as Education officer, museum ?Germaine Pomfret, [facility] (Pharmacist) ? ?Recent office visits: ?05/25/2021 Steele Sizer, MD (PCP Office Visit) for Follow-up- Stopped: Ibuprofen 600 mg, Ipratropium Bromide due to patient completed course, Changed: Docusate Sodium to 100 mg every other day, lab order placed, Referral to [place], Referral to Endocrinology placed, No follow-up noted ? ?05/08/2021 Steele Sizer, MD (PCP Office Visit) for Memory Test- No medication changes noted, Labor orders placed, Referral to Neurology placed, No follow-up noted ?  ?05/07/2021 Steele Sizer, MD (PCP Office Visit) for URI- Started: Azelastine HCL 0.1% use twice daily, Benzonatate 100-200 mg twice daily prn, Stopped: Methylprednisolone 4 mg (Patient completed Course) No orders placed, No follow-up noted ?  ?02/25/2021 Rory Percy, DO (PCP Office Visit) for Rash- Started: Methylprednisolone 4 mg taper, No orders placed, No follow-up noted ?  ?02/18/2021 Steele Sizer, MD (PCP Office Visit) for Follow-up- Started: Pioglitazone HCl 15 mg daily, Stopped: Benzonatate 200 mg (completed course), Metoprolol Tartrate 25 mg (patient not taking), Promethazine DM 6.25-15 (completed course), Changed: Atorvastatin 10 mg daily increased to 20 mg daily., Lab orders placed, Patient to follow-up in 3 months ? ?Recent consult visits: ?None ID ? ?Hospital visits: ?None in previous 6 months ? ? ?Objective: ? ?Lab Results  ?Component Value Date  ? CREATININE 0.98 05/08/2021  ? BUN 11 05/08/2021  ? EGFR 80 05/08/2021  ? GFRNONAA >60 09/09/2020  ? GFRAA 71 12/03/2019  ? NA 141 05/08/2021  ? K 3.8 05/08/2021  ? CALCIUM 9.7 05/08/2021  ? CO2 33 (H) 05/08/2021  ? GLUCOSE 240 (H) 05/08/2021  ? ? ?Lab Results  ?  Component Value Date/Time  ? HGBA1C 11.0 (A) 05/25/2021 11:05 AM  ? HGBA1C 11.8 (A) 02/18/2021 11:04 AM  ? HGBA1C 10.5 (H) 08/27/2020 01:01 AM  ? HGBA1C 10.3 06/30/2020 12:00 AM  ?  HGBA1C 7.0 (H) 09/05/2018 10:44 AM  ? HGBA1C 6.8 02/03/2018 10:21 AM  ? HGBA1C 6.8 (A) 02/03/2018 10:21 AM  ? HGBA1C 6.8 02/03/2018 10:21 AM  ? FRUCTOSAMINE 345 (H) 12/03/2019 03:28 PM  ? MICROALBUR 20 07/16/2020 12:06 PM  ? MICROALBUR 2.3 04/06/2019 12:00 AM  ? MICROALBUR 1.0 09/05/2018 10:44 AM  ?  ?Last diabetic Eye exam: No results found for: HMDIABEYEEXA  ?Last diabetic Foot exam:  ?Lab Results  ?Component Value Date/Time  ? HMDIABFOOTEX Normal 06/30/2020 12:00 AM  ?  ? ?Lab Results  ?Component Value Date  ? CHOL 135 08/27/2020  ? HDL 49 08/27/2020  ? [place] 74 08/27/2020  ? TRIG 100 09/08/2020  ? CHOLHDL 2.8 08/27/2020  ? ? ? ?  Latest Ref Rng & Units 05/08/2021  ?  2:06 PM 09/08/2020  ?  5:06 AM 09/04/2020  ?  5:35 AM  ?Hepatic Function  ?Total Protein 6.1 - 8.1 g/dL 6.5   6.0   6.3    ?Albumin 3.5 - 5.0 g/dL  2.0   2.0    ?AST 10 - 35 U/L _0 ?ALT 9 - 46 U/L _1 ?Alk Phosphatase 38 - 126 U/L  43   54    ?Total Bilirubin 0.2 - 1.2 mg/dL 0.7   0.3   0.4    ? ? ?Lab Results  ?Component Value Date/Time  ? TSH 1.51 05/08/2021 02:06 PM  ? TSH 2.356 08/26/2020 03:29 PM  ? TSH 1.76 06/01/2016 10:37 AM  ? ? ? ?  Latest Ref Rng & Units 05/08/2021  ?  2:06 PM 09/08/2020  ?  5:06 AM 09/05/2020  ?  4:49 AM  ?CBC  ?WBC 3.8 - 10.8 Thousand/uL 4.1   9.3   8.6    ?Hemoglobin 13.2 - 17.1 g/dL 13.1   10.1   9.8    ?Hematocrit 38.5 - 50.0 % 40.2   29.9   29.7    ?Platelets 140 - 400 Thousand/uL 284   294   325    ? ? ?Lab Results  ?Component Value Date/Time  ? VD25OH 27 (L) 06/01/2016 10:37 AM  ? ? ?Clinical ASCVD: No  ?The 10-year ASCVD risk score (Arnett DK, et al., 2019) is: 41.5% ?  Values used to calculate the score: ?    Age: 49 years ?    Sex: Male ?    Is Non-Hispanic African American: Yes ?    Diabetic: Yes ?    Tobacco smoker: No ?    Systolic Blood Pressure: 734 mmHg ?    Is BP treated: Yes ?    HDL Cholesterol: 49 mg/dL ?    Total Cholesterol: 135 mg/dL   ? ? ?  05/25/2021  ? 10:53 AM 05/08/2021  ?   1:02 PM 05/07/2021  ? 11:24 AM  ?Depression screen PHQ 2/9  ?Decreased Interest 0 0 0  ?Down, Depressed, Hopeless 0 0 0  ?PHQ - 2 Score 0 0 0  ?Altered sleeping 0 0 0  ?Tired, decreased energy 0 0 0  ?Change in appetite 0 0 0  ?Feeling bad or failure about yourself  0 0 0  ?Trouble concentrating 0 0 0  ?  Moving slowly or fidgety/restless 0 0 0  ?Suicidal thoughts 0 0 0  ?PHQ-9 Score 0 0 0  ?  ?Social History  ? ?Tobacco Use  ?Smoking Status Former  ? Packs/day: 0.25  ? Years: 50.00  ? Pack years: 12.50  ? Types: Cigarettes  ? Start date: 07/27/1967  ? Quit date: 08/27/2020  ? Years since quitting: 0.9  ?Smokeless Tobacco Never  ? ?BP Readings from Last 3 Encounters:  ?05/25/21 (!) 142/70  ?05/08/21 138/82  ?05/07/21 140/86  ? ?Pulse Readings from Last 3 Encounters:  ?05/25/21 67  ?05/08/21 82  ?05/07/21 90  ? ?Wt Readings from Last 3 Encounters:  ?05/25/21 174 lb (78.9 kg)  ?05/08/21 172 lb (78 kg)  ?05/07/21 172 lb (78 kg)  ? ?BMI Readings from Last 3 Encounters:  ?05/25/21 27.25 kg/m?  ?05/08/21 26.94 kg/m?  ?05/07/21 26.94 kg/m?  ? ? ?Assessment/Interventions: Review of patient past medical history, allergies, medications, health status, including review of consultants reports, laboratory and other test data, was performed as part of comprehensive evaluation and provision of chronic care management services.  ? ?SDOH:  (Social Determinants of Health) assessments and interventions performed: Yes ?SDOH Interventions   ? ?Flowsheet Row Most Recent Value  ?SDOH Interventions   ?Financial Strain Interventions Intervention Not Indicated  ?Transportation Interventions Intervention Not Indicated  ? ?  ? ?SDOH Screenings  ? ?Alcohol Screen: Low Risk   ? Last Alcohol Screening Score (AUDIT): 0  ?Depression (PHQ2-9): Low Risk   ? PHQ-2 Score: 0  ?Financial Resource Strain: Low Risk   ? Difficulty of Paying Living Expenses: Not hard at all  ?Food Insecurity: No Food Insecurity  ? Worried About Charity fundraiser in the Last Year:  Never true  ? Ran Out of Food in the Last Year: Never true  ?Housing: Low Risk   ? Last Housing Risk Score: 0  ?Physical Activity: Sufficiently Active  ? Days of Exercise per Week: 7 days  ? Minutes of E

## 2021-07-24 DIAGNOSIS — E1143 Type 2 diabetes mellitus with diabetic autonomic (poly)neuropathy: Secondary | ICD-10-CM

## 2021-07-24 DIAGNOSIS — E785 Hyperlipidemia, unspecified: Secondary | ICD-10-CM | POA: Diagnosis not present

## 2021-07-24 DIAGNOSIS — I10 Essential (primary) hypertension: Secondary | ICD-10-CM | POA: Diagnosis not present

## 2021-07-24 DIAGNOSIS — E1169 Type 2 diabetes mellitus with other specified complication: Secondary | ICD-10-CM | POA: Diagnosis not present

## 2021-07-28 ENCOUNTER — Other Ambulatory Visit: Payer: Self-pay | Admitting: Family Medicine

## 2021-07-28 DIAGNOSIS — E1165 Type 2 diabetes mellitus with hyperglycemia: Secondary | ICD-10-CM

## 2021-07-29 ENCOUNTER — Telehealth: Payer: Self-pay

## 2021-07-29 DIAGNOSIS — E1143 Type 2 diabetes mellitus with diabetic autonomic (poly)neuropathy: Secondary | ICD-10-CM

## 2021-07-29 NOTE — Progress Notes (Signed)
? ? ?Chronic Care Management ?Pharmacy Assistant  ? ?Name: William Parks  MRN: [id number] DOB: Oct 01, 1945 ? ?Reason for Encounter: Medication Review/Medication Coordination Call for [facility] ?  ?Recent office visits:  ?None ID ? ?Recent consult visits:  ?None ID ? ?Hospital visits:  ?None in previous 6 months ? ?Medications: ?Outpatient Encounter Medications as of 07/29/2021  ?Medication Sig  ? COMFORT EZ PEN NEEDLES 31G X 6 MM MISC USE one THREE TIMES DAILY AS NEEDED  ? OZEMPIC, 0.25 OR 0.5 MG/DOSE, 2 MG/3ML SOPN INJECT 0.5 MG into THE SKIN ONCE A WEEK  ? aspirin 81 MG EC tablet Take 81 mg by mouth daily.  ? atorvastatin (LIPITOR) 20 MG tablet Take 1 tablet (20 mg total) by mouth daily at 6 PM. New dose  ? azelastine (ASTELIN) 0.1 % nasal spray Place 2 sprays into both nostrils 2 (two) times daily. Use in each nostril as directed  ? benzonatate (TESSALON) 100 MG capsule Take 1-2 capsules (100-200 mg total) by mouth 2 (two) times daily as needed.  ? blood glucose meter kit and supplies 1 each by Other route 2 (two) times daily as needed for other. Dispense based on patient and insurance preference. Use up to four times daily as directed. (FOR ICD-10 E10.9, E11.9). ?Uncontrolled DM  ? docusate sodium (COLACE) 100 MG capsule Take 1 capsule (100 mg total) by mouth every other day.  ? Insulin Lispro Prot & Lispro (HUMALOG MIX 75/25 KWIKPEN) (75-25) 100 UNIT/ML Kwikpen INJECT INTO THE SKIN 2 (TWO) TIMES DAILY WITH A MEAL. 30 UNITS IN AM AND 25 UNITS BEFORE DINNER  ? Lancets (ONETOUCH DELICA PLUS [id number]) MISC   ? losartan (COZAAR) 100 MG tablet Take 1 tablet (100 mg total) by mouth daily.  ? ONETOUCH ULTRA test strip TEST UP TO 4 TIMES A DAY  ? pioglitazone (ACTOS) 15 MG tablet Take 1 tablet (15 mg total) by mouth daily.  ? pregabalin (LYRICA) 150 MG capsule Take 1 capsule (150 mg total) by mouth at bedtime.  ? tiZANidine (ZANAFLEX) 2 MG tablet TAKE 1 TABLET BY MOUTH EVERY DAY IN THE EVENING  ? ?No facility-administered  encounter medications on file as of 07/29/2021.  ? ?Care Gaps: ?Zoster Vaccines ?COVID-19 Vaccine Booster 3 ?Diabetic Eye Exam ?Diabetic Foot Exam ?A1C > 9 ?BP > 140/90 ? ?Star Rating Drugs: ?Atorvastatin 20 mg last filled on 07/08/2021 for a 30-Day supply with [facility] ?Losartan 100 mg last filled on 07/08/2021 for a 30-Day supply with [facility] ?Pioglitazone 15 mg last filled on 07/08/2021 for a 30-Day supply with [facility] ?Ozempic 0.5 mg last filled on 07/08/2021 for a 30-Day supply with [facility] ? ?BP Readings from Last 3 Encounters:  ?05/25/21 (!) 142/70  ?05/08/21 138/82  ?05/07/21 140/86  ?  ?Lab Results  ?Component Value Date  ? HGBA1C 11.0 (A) 05/25/2021  ?  ?Patient obtains medications through Adherence Packaging  30 Days  ? ?Last adherence delivery included:  ?This is the 1st time completing Medication Coordination for the Patietnt ? ?Patient declined medications last month: ?Not sure if any medications were declined last month this is the first Medication Coordination for the patient ? ?Patient is due for next adherence delivery on: 08/10/2021 (Monday) 2nd Route after 3 pm ? ?Called patient and reviewed medications and coordinated delivery. ? ?This delivery to include: ?Pen Needles 31 g 6 mm use three times daily ?Docusate Sodium 100 mg 1 capsule every other day (Bedtime) ?Atorvastatin 20 mg take 1 tablet daily (Bedtime) ?  Pioglitazone 150 mg 1 capsule daily (Bedtime) ?Pregabalin 150 mg 1 capsule daily (Bedtime) ?Tizanidine 2 mg 1 tablet daily (Bedtime) ?Losartan 100 mg 1 tablet daily (Bedtime) ?Ozempic 2 mg/44m Inject 0.5 mg weekly ?Lancets OneTouch Delica Plus ?OneTouch Ultra Test Strips ? ?Patient declined the following medications: ? ?Patient needs refills for Test strips and Lancets that CPP can send to [facility]. ? ?Confirmed delivery date of 08/10/2021 2nd Route, advised patient's daughter CMalachy Moodthat pharmacy will contact them the morning of delivery. ? ?Patient has  a telephone appointment with AJunius Argyle CPP on 09/02/2021 @ 1530 ? ?TLynann Bologna CPA/CMA ?Clinical Pharmacist Assistant ?Phone: [phone] ? ? ?

## 2021-07-30 MED ORDER — ONETOUCH DELICA PLUS LANCET33G MISC: 11 refills | Status: DC

## 2021-07-30 MED ORDER — ONETOUCH ULTRA VI STRP: ORAL_STRIP | 11 refills | Status: DC

## 2021-07-30 NOTE — Addendum Note (Signed)
Addended by: Julious Payer A on: 07/30/2021 09:01 AM ? ? Modules accepted: Orders ? ?

## 2021-08-06 ENCOUNTER — Ambulatory Visit: Payer: Medicare Other

## 2021-08-13 ENCOUNTER — Other Ambulatory Visit: Payer: Self-pay | Admitting: Family Medicine

## 2021-08-13 ENCOUNTER — Ambulatory Visit: Payer: Medicare Other

## 2021-08-13 DIAGNOSIS — I10 Essential (primary) hypertension: Secondary | ICD-10-CM

## 2021-08-15 ENCOUNTER — Other Ambulatory Visit: Payer: Self-pay | Admitting: Family Medicine

## 2021-08-15 DIAGNOSIS — E1165 Type 2 diabetes mellitus with hyperglycemia: Secondary | ICD-10-CM

## 2021-08-15 DIAGNOSIS — I700 Atherosclerosis of aorta: Secondary | ICD-10-CM

## 2021-08-15 DIAGNOSIS — E1169 Type 2 diabetes mellitus with other specified complication: Secondary | ICD-10-CM

## 2021-08-21 ENCOUNTER — Ambulatory Visit: Payer: Medicare Other | Admitting: Family Medicine

## 2021-08-21 NOTE — Progress Notes (Deleted)
Name: William Parks   MRN: [id number]    DOB: [birth year]   Date:08/21/2021       Progress Note  Subjective  Chief Complaint  Follow up   HPI  DMII: he was seen back in April 2021 and A1C was 12.4 %,  11.1 %  to 9.2 % and back in May  2022 10.5 % , up to 11.8 %  down to 11 % . He is now on dose of insulin  75/25 he is supposed to be taking 30 in am and 25 in pm, and also on  Ozempic 0.5 once a week, however daughter, [name], came in with him today and states she has been at the house since last Thursday and have not seen him inject himself yet. Marland Kitchen He has associated HTN, dyslipidemia and neuropathy. He is taking Lyrica as prescribed for diabetic neuropathy and seems to be controlling symptoms. He cannot tolerate Metformin but is taking Actos. We will send rx to [facility] so we can monitor compliance. He does not want to go see Endo, but daughter wants him to go   HTN: he is taking losartan 100 daily but states not longer taking Metoprolol. BP is at goal . Denies chest pain or palpitation    Dyslipidemia: he is taking Atorvastatin, not sure how compliant he has been with medication    Tobacco use: he quit May 4 th, 2022 with the nicotine patch. No cough or wheezing, Doing well    Intermittent low back pain: he states doing well lately, Lyrica seems to help , takes Tizanidine prn only    Malnutrition: he states he used to be in the 210 lbs range, went down to 187 lbs gradually started to gain weight, but in May was admitted for rupture appendix and  after that had COVID-19:  He has lack of appetite and weight was down to 171 lbs , now at 174 lbs stable around that range. He has lost 10 % down from original weight    Atherosclerosis of aorta: on statin and aspirin daily , last LDL at 74 , he is now on Atorvastatin 20 mg but last bottle filled in October and bottle is still full. 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 ([place]) 05/25/2021   S/P right colectomy 11/14/2020   DM (diabetes mellitus), type 2, uncontrolled 08/26/2020   Atherosclerosis of aorta ([place]) 10/03/2019   Neck and shoulder pain 11/08/2016   Vitamin D deficiency 10/15/2015   Dyslipidemia 06/02/2015   Hypertension 04/01/2015   Uncontrolled type 2 diabetes mellitus with peripheral neuropathy 04/01/2015    Past Surgical History:  Procedure Laterality Date   APPENDECTOMY     CYST REMOVAL TRUNK     2x   LAPAROSCOPY N/A 09/01/2020   Procedure: LAPAROSCOPY DIAGNOSTIC;  Surgeon: Olean Ree, MD;  Location: [facility] OR[facility];  Service: General;  Laterality: N/A;   PARTIAL COLECTOMY N/A 09/01/2020   Procedure: PARTIAL RIGHT COLECTOMY;  Surgeon: Olean Ree, MD;  Location: [facility];  Service: General;  Laterality: N/A;   SHOULDER SURGERY Right 2009   rotator cuff surgery    Family History  Problem Relation Age of Onset   Cancer Mother        Breast  CA   Diabetes Mother    Hypertension Mother    Diabetes Brother     Social History   Tobacco Use   Smoking status: Former    Packs/day: 0.25    Years: 50.00    Pack years: 12.50    Types: Cigarettes    Start date: 07/27/1967    Quit date: 08/27/2020    Years since quitting: 0.9   Smokeless tobacco: Never  Substance Use Topics   Alcohol use: No    Alcohol/week: 0.0 standard drinks     Current Outpatient Medications:    COMFORT EZ PEN NEEDLES 31G X 6 MM MISC, USE one THREE TIMES DAILY AS NEEDED, Disp: 100 each, Rfl: 1   OZEMPIC, 0.25 OR 0.5 MG/DOSE, 2 MG/3ML SOPN, INJECT 0.5 MG into THE SKIN ONCE A WEEK, Disp: 6 mL, Rfl: 0   aspirin 81 MG EC tablet, Take 81 mg by mouth daily., Disp: , Rfl: 5   atorvastatin (LIPITOR) 20 MG tablet, Take 1 tablet (20 mg total) by mouth daily at 6 PM. New dose, Disp: 90  tablet, Rfl: 1   azelastine (ASTELIN) 0.1 % nasal spray, Place 2 sprays into both nostrils 2 (two) times daily. Use in each nostril as directed, Disp: 30 mL, Rfl: 2   benzonatate (TESSALON) 100 MG capsule, Take 1-2 capsules (100-200 mg total) by mouth 2 (two) times daily as needed., Disp: 40 capsule, Rfl: 0   blood glucose meter kit and supplies, 1 each by Other route 2 (two) times daily as needed for other. Dispense based on patient and insurance preference. Use up to four times daily as directed. (FOR ICD-10 E10.9, E11.9). Uncontrolled DM, Disp: 1 each, Rfl: 0   docusate sodium (COLACE) 100 MG capsule, Take 1 capsule (100 mg total) by mouth every other day., Disp: 15 capsule, Rfl: 5   glucose blood (ONETOUCH ULTRA) test strip, Use to check blood sugars four times daily as instructed, Disp: 100 strip, Rfl: 11   Insulin Lispro Prot & Lispro (HUMALOG 75/25 MIX) (75-25) 100 UNIT/ML Kwikpen, INJECT into THE SKIN TWICE DAILY with meals. 30 UNITS IN THE MORNING AND 25 UNITS BEFORE SUPPER, Disp: 15 mL, Rfl: 0   Lancets (ONETOUCH DELICA PLUS [id number]) MISC, Use to check blood sugars four times daily as instructed, Disp: 100 each, Rfl: 11   losartan (COZAAR) 100 MG tablet, TAKE 1 TABLET(100 MG) BY MOUTH DAILY, Disp: 90 tablet, Rfl: 0   pioglitazone (ACTOS) 15 MG tablet, Take 1 tablet (15 mg total) by mouth daily., Disp: 90 tablet, Rfl: 1   pregabalin (LYRICA) 150 MG capsule, Take 1 capsule (150 mg total) by mouth at bedtime., Disp: 90 capsule, Rfl: 0   tiZANidine (ZANAFLEX) 2 MG tablet, TAKE 1 TABLET BY MOUTH EVERY DAY IN THE EVENING, Disp: 90 tablet, Rfl: 0  No Known Allergies  I personally reviewed {Reviewed:[id number]} with the patient/caregiver today.   ROS  ***  Objective  There were no vitals filed for this visit.  There is no height or weight on file to calculate BMI.  Physical Exam ***  Recent Results (from the past 2160 hour(s))  POCT HgB A1C     Status: Abnormal   Collection Time:  05/25/21 11:05 AM  Result Value Ref Range   Hemoglobin A1C 11.0 (A) 4.0 - 5.6 %   HbA1c POC (<> result, manual entry)     HbA1c, POC (prediabetic range)     HbA1c, POC (controlled diabetic range)      Diabetic  Foot Exam: Diabetic Foot Exam - Simple   No data filed    ***  PHQ2/9:    05/25/2021   10:53 AM 05/08/2021    1:02 PM 05/07/2021   11:24 AM 02/25/2021    1:42 PM 02/18/2021   10:51 AM  Depression screen PHQ 2/9  Decreased Interest 0 0 0 0 0  Down, Depressed, Hopeless 0 0 0 0 0  PHQ - 2 Score 0 0 0 0 0  Altered sleeping 0 0 0 0 0  Tired, decreased energy 0 0 0 0 0  Change in appetite 0 0 0 0 3  Feeling bad or failure about yourself  0 0 0 0 0  Trouble concentrating 0 0 0 0 0  Moving slowly or fidgety/restless 0 0 0 0 0  Suicidal thoughts 0 0 0 0 0  PHQ-9 Score 0 0 0 0 3  Difficult doing work/chores    Not difficult at all     phq 9 is {gen pos [id number]} ***  Fall Risk:    05/25/2021   10:53 AM 05/08/2021    1:02 PM 05/07/2021   11:23 AM 02/25/2021    1:42 PM 02/18/2021   10:51 AM  Fall Risk   Falls in the past year? 1 0 0 0 0  Number falls in past yr: 1 0 0 0 0  Injury with Fall? 0 0 0 0 0  Risk for fall due to : Impaired balance/gait No Fall Risks No Fall Risks  No Fall Risks  Follow up Falls prevention discussed Falls prevention discussed Falls prevention discussed  Falls prevention discussed   ***   Functional Status Survey:   ***   Assessment & Plan  *** There are no diagnoses linked to this encounter.

## 2021-08-27 ENCOUNTER — Ambulatory Visit (INDEPENDENT_AMBULATORY_CARE_PROVIDER_SITE_OTHER): Payer: Medicare Other

## 2021-08-27 ENCOUNTER — Telehealth: Payer: Self-pay | Admitting: Family Medicine

## 2021-08-27 ENCOUNTER — Telehealth: Payer: Self-pay

## 2021-08-27 DIAGNOSIS — I10 Essential (primary) hypertension: Secondary | ICD-10-CM

## 2021-08-27 DIAGNOSIS — M545 Low back pain, unspecified: Secondary | ICD-10-CM

## 2021-08-27 DIAGNOSIS — Z0000 Encounter for general adult medical examination without abnormal findings: Secondary | ICD-10-CM

## 2021-08-27 NOTE — Progress Notes (Signed)
? ? ?Chronic Care Management ?Pharmacy Assistant  ? ?Name: William Parks  MRN: [id number] DOB: 25-Jun-1945 ? ?Reason for Encounter: Medication Review/Medication Coordination ?  ?Recent office visits:  ?None ID ? ?Recent consult visits:  ?07/30/2021 Hemang Leeanne Deed, MD (Neurology) I am unable to view this note ? ?Hospital visits:  ?None in previous 6 months ? ?Medications: ?Outpatient Encounter Medications as of 08/27/2021  ?Medication Sig  ? COMFORT EZ PEN NEEDLES 31G X 6 MM MISC USE one THREE TIMES DAILY AS NEEDED  ? OZEMPIC, 0.25 OR 0.5 MG/DOSE, 2 MG/3ML SOPN INJECT 0.5 MG into THE SKIN ONCE A WEEK  ? aspirin 81 MG EC tablet Take 81 mg by mouth daily.  ? atorvastatin (LIPITOR) 20 MG tablet Take 1 tablet (20 mg total) by mouth daily at 6 PM. New dose  ? azelastine (ASTELIN) 0.1 % nasal spray Place 2 sprays into both nostrils 2 (two) times daily. Use in each nostril as directed  ? benzonatate (TESSALON) 100 MG capsule Take 1-2 capsules (100-200 mg total) by mouth 2 (two) times daily as needed.  ? blood glucose meter kit and supplies 1 each by Other route 2 (two) times daily as needed for other. Dispense based on patient and insurance preference. Use up to four times daily as directed. (FOR ICD-10 E10.9, E11.9). ?Uncontrolled DM  ? docusate sodium (COLACE) 100 MG capsule Take 1 capsule (100 mg total) by mouth every other day.  ? glucose blood (ONETOUCH ULTRA) test strip Use to check blood sugars four times daily as instructed  ? Insulin Lispro Prot & Lispro (HUMALOG 75/25 MIX) (75-25) 100 UNIT/ML Kwikpen INJECT into THE SKIN TWICE DAILY with meals. 30 UNITS IN THE MORNING AND 25 UNITS BEFORE SUPPER  ? Lancets (ONETOUCH DELICA PLUS [id number]) MISC Use to check blood sugars four times daily as instructed  ? losartan (COZAAR) 100 MG tablet TAKE 1 TABLET(100 MG) BY MOUTH DAILY  ? pioglitazone (ACTOS) 15 MG tablet Take 1 tablet (15 mg total) by mouth daily.  ? pregabalin (LYRICA) 150 MG capsule Take 1 capsule (150 mg  total) by mouth at bedtime.  ? tiZANidine (ZANAFLEX) 2 MG tablet TAKE 1 TABLET BY MOUTH EVERY DAY IN THE EVENING  ? ?No facility-administered encounter medications on file as of 08/27/2021.  ? ?Care Gaps: ?Zoster Vaccines ?COVID-19 Vaccine Booster 3 ?Diabetic Eye Exam ?Diabetic Foot Exam ?A1C > 9 ?BP > 140/90 ? ?Star Rating Drugs: ?Atorvastatin 20 mg last filled on 08/05/2021 for a 30-Day supply with [facility] ?Losartan 100 mg last filled on 08/06/2021 for a 30-Day supply with [facility] ?Pioglitazone 15 mg last filled on 08/05/2021 for a 30-Day supply with [facility] ?Ozempic 0.5 mg last filled on 08/05/2021 for a 30-Day supply with [facility] ? ?BP Readings from Last 3 Encounters:  ?05/25/21 (!) 142/70  ?05/08/21 138/82  ?05/07/21 140/86  ?  ?Lab Results  ?Component Value Date  ? HGBA1C 11.0 (A) 05/25/2021  ?  ?Patient obtains medications through Adherence Packaging  30 Days  ? ?Last adherence delivery included:  ?Pen Needles 31 g 6 mm use three times daily ?Docusate Sodium 100 mg 1 capsule every other day (Bedtime) ?Atorvastatin 20 mg take 1 tablet daily (Bedtime) ?Pioglitazone 150 mg 1 capsule daily (Bedtime) ?Pregabalin 150 mg 1 capsule daily (Bedtime) ?Tizanidine 2 mg 1 tablet daily (Bedtime) ?Losartan 100 mg 1 tablet daily (Bedtime) ?Ozempic 2 mg/7m Inject 0.5 mg weekly ?Lancets OneTouch Delica Plus ?OneTouch Ultra Test Strips ? ?Patient declined medications last  month: ?No medications were declined last month ? ?Patient is due for next adherence delivery on: 09/08/2021 (Tuesday) [facility] between 11 am - 3 pm. ? ?Called patient and reviewed medications and coordinated delivery. ? ?This delivery to include: ?Pen Needles 31 g 6 mm use three times daily ?Docusate Sodium 100 mg 1 capsule every other day (Bedtime) ?Atorvastatin 20 mg take 1 tablet daily (Bedtime) ?Pioglitazone 150 mg 1 capsule daily (Bedtime) ?Pregabalin 150 mg 1 capsule daily (Bedtime) ?Tizanidine 2 mg 1 tablet daily  (Bedtime) ?Losartan 100 mg 1 tablet daily (Bedtime) ?Ozempic 2 mg/56m Inject 0.5 mg weekly ?Lancets OneTouch Delica Plus ?OneTouch Ultra Test Strips ? ?Patient declined the following medications: ?No medications were declined ? ?Patient needs refills for Pregabalin 150 mg, Tizanidine 2 mg, Losartan 100 mg. These are PCP medications so CPP can send in refill for all of them but the Lyrica. Dr. SAncil Boozerwas contacted with refill request. ? ?Confirmed delivery date of 09/08/2021 [facility], advised patient that pharmacy will contact them the morning of delivery. ? ?Patient has a follow-up telephone appointment with AJunius Argyle CPP on 09/02/2021 @ 1430. ? ?05/04 Left message requesting a call back ? ?TLynann Bologna CPA/CMA ?Clinical Pharmacist Assistant ?Phone: [phone] ? ?

## 2021-08-27 NOTE — Progress Notes (Signed)
? ?Subjective:  ? William Parks is a 76 y.o. male who presents for Medicare Annual/Subsequent preventive examination. ? ?Virtual Visit via Telephone Note ? ?I connected with  William Parks on 08/27/21 at 11:45 AM EDT by telephone and verified that I am speaking with the correct person using two identifiers. ? ?Location: ?Patient: home ?Provider: [place] [facility] ?Persons participating in the virtual visit: patient/Nurse Health Advisor ?  ?I discussed the limitations, risks, security and privacy concerns of performing an evaluation and management service by telephone and the availability of in person appointments. The patient expressed understanding and agreed to proceed. ? ?Interactive audio and video telecommunications were attempted between this nurse and patient, however failed, due to patient having technical difficulties OR patient did not have access to video capability.  We continued and completed visit with audio only. ? ?Some vital signs may be absent or patient reported.  ? ?William Marker, LPN ? ? ?Review of Systems    ? ?Cardiac Risk Factors include: advanced age (>47mn, >>67women);diabetes mellitus;dyslipidemia;male gender;hypertension ? ?   ?Objective:  ?  ?There were no vitals filed for this visit. ?There is no height or weight on file to calculate BMI. ? ? ?  08/27/2021  ? 11:58 AM 11/08/2020  ?  2:07 PM 09/12/2020  ?  4:15 PM 08/27/2020  ? 12:34 AM 08/26/2020  ?  3:31 PM 08/05/2020  ?  2:42 PM 05/24/2019  ? 11:53 AM  ?Advanced Directives  ?Does Patient Have a Medical Advance Directive? No No No  No No No  ?Would patient like information on creating a medical advance directive? No - Patient declined  No - Patient declined No - Guardian declined  No - Patient declined No - Patient declined  ? ? ?Current Medications (verified) ?Outpatient Encounter Medications as of 08/27/2021  ?Medication Sig  ? aspirin 81 MG EC tablet Take 81 mg by mouth daily.  ? atorvastatin (LIPITOR) 20 MG tablet Take 1 tablet (20 mg total) by mouth  daily at 6 PM. New dose  ? azelastine (ASTELIN) 0.1 % nasal spray Place 2 sprays into both nostrils 2 (two) times daily. Use in each nostril as directed  ? blood glucose meter kit and supplies 1 each by Other route 2 (two) times daily as needed for other. Dispense based on patient and insurance preference. Use up to four times daily as directed. (FOR ICD-10 E10.9, E11.9). ?Uncontrolled DM  ? COMFORT EZ PEN NEEDLES 31G X 6 MM MISC USE one THREE TIMES DAILY AS NEEDED  ? docusate sodium (COLACE) 100 MG capsule Take 1 capsule (100 mg total) by mouth every other day.  ? glucose blood (ONETOUCH ULTRA) test strip Use to check blood sugars four times daily as instructed  ? Insulin Lispro Prot & Lispro (HUMALOG 75/25 MIX) (75-25) 100 UNIT/ML Kwikpen INJECT into THE SKIN TWICE DAILY with meals. 30 UNITS IN THE MORNING AND 25 UNITS BEFORE SUPPER  ? Lancets (ONETOUCH DELICA PLUS [id number] MISC Use to check blood sugars four times daily as instructed  ? losartan (COZAAR) 100 MG tablet TAKE 1 TABLET(100 MG) BY MOUTH DAILY  ? OZEMPIC, 0.25 OR 0.5 MG/DOSE, 2 MG/3ML SOPN INJECT 0.5 MG into THE SKIN ONCE A WEEK  ? pioglitazone (ACTOS) 15 MG tablet Take 1 tablet (15 mg total) by mouth daily.  ? pregabalin (LYRICA) 150 MG capsule Take 1 capsule (150 mg total) by mouth at bedtime.  ? tiZANidine (ZANAFLEX) 2 MG tablet TAKE 1 TABLET BY MOUTH EVERY DAY IN THE  EVENING  ? [DISCONTINUED] benzonatate (TESSALON) 100 MG capsule Take 1-2 capsules (100-200 mg total) by mouth 2 (two) times daily as needed.  ? ?No facility-administered encounter medications on file as of 08/27/2021.  ? ? ?Allergies (verified) ?Patient has no known allergies.  ? ?History: ?Past Medical History:  ?Diagnosis Date  ? Arthritis   ? rt shoulder  ? Diabetes mellitus without complication ([place])   ? Hyperlipidemia   ? Hyperlipidemia associated with type 2 diabetes mellitus ([place]) 06/02/2015  ? Hypertension   ? ?Past Surgical History:  ?Procedure Laterality Date  ? APPENDECTOMY     ? CYST REMOVAL TRUNK    ? 2x  ? LAPAROSCOPY N/A 09/01/2020  ? Procedure: LAPAROSCOPY DIAGNOSTIC;  Surgeon: Olean Ree, MD;  Location: [facility] OR[facility];  Service: General;  Laterality: N/A;  ? PARTIAL COLECTOMY N/A 09/01/2020  ? Procedure: PARTIAL RIGHT COLECTOMY;  Surgeon: Olean Ree, MD;  Location: [facility];  Service: General;  Laterality: N/A;  ? SHOULDER SURGERY Right 2009  ? rotator cuff surgery  ? ?Family History  ?Problem Relation Age of Onset  ? Cancer Mother   ?     Breast CA  ? Diabetes Mother   ? Hypertension Mother   ? Diabetes Brother   ? ?Social History  ? ?Socioeconomic History  ? Marital status: Divorced  ?  Spouse name: Not on file  ? Number of children: 3  ? Years of education: Not on file  ? Highest education level: 10th grade  ?Occupational History  ? Occupation: Retired  ?Tobacco Use  ? Smoking status: Former  ?  Packs/day: 0.25  ?  Years: 50.00  ?  Pack years: 12.50  ?  Types: Cigarettes  ?  Start date: 07/27/1967  ?  Quit date: 08/27/2020  ?  Years since quitting: 1.0  ? Smokeless tobacco: Never  ?Vaping Use  ? Vaping Use: Never used  ?Substance and Sexual Activity  ? Alcohol use: No  ?  Alcohol/week: 0.0 standard drinks  ? Drug use: No  ? Sexual activity: Yes  ?  Partners: Female  ?Other Topics Concern  ? Not on file  ?Social History Narrative  ? Lives with girlfriend - William Parks for the past 20 years; passed away 08-24-2021  ? ?Social Determinants of Health  ? ?Financial Resource Strain: Low Risk   ? Difficulty of Paying Living Expenses: Not hard at all  ?Food Insecurity: No Food Insecurity  ? Worried About Charity fundraiser in the Last Year: Never true  ? Ran Out of Food in the Last Year: Never true  ?Transportation Needs: No Transportation Needs  ? Lack of Transportation (Medical): No  ? Lack of Transportation (Non-Medical): No  ?Physical Activity: Sufficiently Active  ? Days of Exercise per Week: 7 days  ? Minutes of Exercise per Session: 30 min  ?Stress: No Stress Concern Present  ?  Feeling of Stress : Only a little  ?Social Connections: Moderately Isolated  ? Frequency of Communication with Friends and Family: More than three times a week  ? Frequency of Social Gatherings with Friends and Family: Three times a week  ? Attends Religious Services: More than 4 times per year  ? Active Member of Clubs or Organizations: No  ? Attends Archivist Meetings: Never  ? Marital Status: Divorced  ? ? ?Tobacco Counseling ?Counseling given: Not Answered ? ? ?Clinical Intake: ? ?Pre-visit preparation completed: Yes ? ?Pain : No/denies pain ? ?  ? ?Nutritional Risks: None ?  Diabetes: Yes ?CBG done?: No ?Did pt. bring in CBG monitor from home?: No ? ?How often do you need to have someone help you when you read instructions, pamphlets, or other written materials from your doctor or pharmacy?: 1 - Never ? ?Nutrition Risk Assessment: ? ?Has the patient had any N/V/D within the last 2 months?  No  ?Does the patient have any non-healing wounds?  No  ?Has the patient had any unintentional weight loss or weight gain?  No  ? ?Diabetes: ? ?Is the patient diabetic?  Yes  ?If diabetic, was a CBG obtained today?  No  ?Did the patient bring in their glucometer from home?  No  ?How often do you monitor your CBG's? dailly.  ? ?Financial Strains and Diabetes Management: ? ?Are you having any financial strains with the device, your supplies or your medication? No .  ?Does the patient want to be seen by Chronic Care Management for management of their diabetes?  Yes  - already enrolled ?Would the patient like to be referred to a Nutritionist or for Diabetic Management?  No  ? ?Diabetic Exams: ? ?Diabetic Eye Exam: Completed 01/22/20. Overdue for diabetic eye exam. Pt has been advised about the importance in completing this exam. A referral has been placed today. Message sent to referral coordinator for scheduling purposes. Advised pt to expect a call from office referred to regarding appt. ? ?Diabetic Foot Exam: Completed  06/30/20. Pt has been advised about the importance in completing this exam. Pt is scheduled for diabetic foot exam on 10/06/21.  ? ? ?Interpreter Needed?: No ? ?Information entered by :: William Marker LPN ? ? ?Ac

## 2021-08-27 NOTE — Telephone Encounter (Signed)
Just FYI. Patient love of his life ( his words) died a few weeks ago and his Mother has now died

## 2021-08-27 NOTE — Patient Instructions (Signed)
William Parks , ?Thank you for taking time to come for your Medicare Wellness Visit. I appreciate your ongoing commitment to your health goals. Please review the following plan we discussed and let me know if I can assist you in the future.  ? ?Screening recommendations/referrals: ?Colonoscopy: no longer required ?Recommended yearly ophthalmology/optometry visit for glaucoma screening and checkup ?Recommended yearly dental visit for hygiene and checkup ? ?Vaccinations: ?Influenza vaccine: done 02/18/21 ?Pneumococcal vaccine: done 2019 ?Tdap vaccine: due ?Shingles vaccine: done 05/12/18; due for second dose   ?Covid-19: done 01/09/20 & 01/30/20 ? ?Advanced directives: Advance directive discussed with you today. Even though you declined this today please call our office should you change your mind and we can give you the proper paperwork for you to fill out.  ? ?Conditions/risks identified: Keep up the great work! ? ?Next appointment: Follow up in one year for your annual wellness visit.  ? ?Preventive Care 25 Years and Older, Male ?Preventive care refers to lifestyle choices and visits with your health care provider that can promote health and wellness. ?What does preventive care include? ?A yearly physical exam. This is also called an annual well check. ?Dental exams once or twice a year. ?Routine eye exams. Ask your health care provider how often you should have your eyes checked. ?Personal lifestyle choices, including: ?Daily care of your teeth and gums. ?Regular physical activity. ?Eating a healthy diet. ?Avoiding tobacco and drug use. ?Limiting alcohol use. ?Practicing safe sex. ?Taking low doses of aspirin every day. ?Taking vitamin and mineral supplements as recommended by your health care provider. ?What happens during an annual well check? ?The services and screenings done by your health care provider during your annual well check will depend on your age, overall health, lifestyle risk factors, and family  history of disease. ?Counseling  ?Your health care provider may ask you questions about your: ?Alcohol use. ?Tobacco use. ?Drug use. ?Emotional well-being. ?Home and relationship well-being. ?Sexual activity. ?Eating habits. ?History of falls. ?Memory and ability to understand (cognition). ?Work and work Astronomer. ?Screening  ?You may have the following tests or measurements: ?Height, weight, and BMI. ?Blood pressure. ?Lipid and cholesterol levels. These may be checked every 5 years, or more frequently if you are over 97 years old. ?Skin check. ?Lung cancer screening. You may have this screening every year starting at age 7 if you have a 30-pack-year history of smoking and currently smoke or have quit within the past 15 years. ?Fecal occult blood test (FOBT) of the stool. You may have this test every year starting at age 58. ?Flexible sigmoidoscopy or colonoscopy. You may have a sigmoidoscopy every 5 years or a colonoscopy every 10 years starting at age 4. ?Prostate cancer screening. Recommendations will vary depending on your family history and other risks. ?Hepatitis C blood test. ?Hepatitis B blood test. ?Sexually transmitted disease (STD) testing. ?Diabetes screening. This is done by checking your blood sugar (glucose) after you have not eaten for a while (fasting). You may have this done every 1-3 years. ?Abdominal aortic aneurysm (AAA) screening. You may need this if you are a current or former smoker. ?Osteoporosis. You may be screened starting at age 67 if you are at high risk. ?Talk with your health care provider about your test results, treatment options, and if necessary, the need for more tests. ?Vaccines  ?Your health care provider may recommend certain vaccines, such as: ?Influenza vaccine. This is recommended every year. ?Tetanus, diphtheria, and acellular pertussis (Tdap, Td) vaccine. You may need  a Td booster every 10 years. ?Zoster vaccine. You may need this after age 36. ?Pneumococcal  13-valent conjugate (PCV13) vaccine. One dose is recommended after age 69. ?Pneumococcal polysaccharide (PPSV23) vaccine. One dose is recommended after age 52. ?Talk to your health care provider about which screenings and vaccines you need and how often you need them. ?This information is not intended to replace advice given to you by your health care provider. Make sure you discuss any questions you have with your health care provider. ?Document Released: 05/09/2015 Document Revised: 12/31/2015 Document Reviewed: 02/11/2015 ?Elsevier Interactive Patient Education ? 2017 [place]. ? ?Fall Prevention in the Home ?Falls can cause injuries. They can happen to people of all ages. There are many things you can do to make your home safe and to help prevent falls. ?What can I do on the outside of my home? ?Regularly fix the edges of walkways and driveways and fix any cracks. ?Remove anything that might make you trip as you walk through a door, such as a raised step or threshold. ?Trim any bushes or trees on the path to your home. ?Use bright outdoor lighting. ?Clear any walking paths of anything that might make someone trip, such as rocks or tools. ?Regularly check to see if handrails are loose or broken. Make sure that both sides of any steps have handrails. ?Any raised decks and porches should have guardrails on the edges. ?Have any leaves, snow, or ice cleared regularly. ?Use sand or salt on walking paths during winter. ?Clean up any spills in your garage right away. This includes oil or grease spills. ?What can I do in the bathroom? ?Use night lights. ?Install grab bars by the toilet and in the tub and shower. Do not use towel bars as grab bars. ?Use non-skid mats or decals in the tub or shower. ?If you need to sit down in the shower, use a plastic, non-slip stool. ?Keep the floor dry. Clean up any water that spills on the floor as soon as it happens. ?Remove soap buildup in the tub or shower regularly. ?Attach  bath mats securely with double-sided non-slip rug tape. ?Do not have throw rugs and other things on the floor that can make you trip. ?What can I do in the bedroom? ?Use night lights. ?Make sure that you have a light by your bed that is easy to reach. ?Do not use any sheets or blankets that are too big for your bed. They should not hang down onto the floor. ?Have a firm chair that has side arms. You can use this for support while you get dressed. ?Do not have throw rugs and other things on the floor that can make you trip. ?What can I do in the kitchen? ?Clean up any spills right away. ?Avoid walking on wet floors. ?Keep items that you use a lot in easy-to-reach places. ?If you need to reach something above you, use a strong step stool that has a grab bar. ?Keep electrical cords out of the way. ?Do not use floor polish or wax that makes floors slippery. If you must use wax, use non-skid floor wax. ?Do not have throw rugs and other things on the floor that can make you trip. ?What can I do with my stairs? ?Do not leave any items on the stairs. ?Make sure that there are handrails on both sides of the stairs and use them. Fix handrails that are broken or loose. Make sure that handrails are as long as the stairways. ?Check  any carpeting to make sure that it is firmly attached to the stairs. Fix any carpet that is loose or worn. ?Avoid having throw rugs at the top or bottom of the stairs. If you do have throw rugs, attach them to the floor with carpet tape. ?Make sure that you have a light switch at the top of the stairs and the bottom of the stairs. If you do not have them, ask someone to add them for you. ?What else can I do to help prevent falls? ?Wear shoes that: ?Do not have high heels. ?Have rubber bottoms. ?Are comfortable and fit you well. ?Are closed at the toe. Do not wear sandals. ?If you use a stepladder: ?Make sure that it is fully opened. Do not climb a closed stepladder. ?Make sure that both sides of the  stepladder are locked into place. ?Ask someone to hold it for you, if possible. ?Clearly mark and make sure that you can see: ?Any grab bars or handrails. ?First and last steps. ?Where the edge of each step is. ?Use t

## 2021-08-27 NOTE — Telephone Encounter (Signed)
Requested medication (s) are due for refill today - yes ? ?Requested medication (s) are on the active medication list -yes ? ?Future visit scheduled -yes ? ?Last refill: 05/25/21 #90 ? ?Notes to clinic: Request RF: non delegated Rx ? ?Requested Prescriptions  ?Pending Prescriptions Disp Refills  ? pregabalin (LYRICA) 150 MG capsule 90 capsule 0  ?  Sig: Take 1 capsule (150 mg total) by mouth at bedtime.  ?  ? Not Delegated - Neurology:  Anticonvulsants - Controlled - pregabalin Failed - 08/27/2021  2:24 PM  ?  ?  Failed - This refill cannot be delegated  ?  ?  Passed - Cr in normal range and within 360 days  ?  Creat  ?Date Value Ref Range Status  ?05/08/2021 0.98 0.70 - 1.28 mg/dL Final  ? ?Creatinine, Urine  ?Date Value Ref Range Status  ?04/06/2019 109 20 - 320 mg/dL Final  ?  ?  ?  ?  Passed - Completed PHQ-2 or PHQ-9 in the last 360 days  ?  ?  Passed - Valid encounter within last 12 months  ?  Recent Outpatient Visits   ? ?      ? 3 months ago Uncontrolled type 2 diabetes mellitus with hyperglycemia (HCC)  ? [facility] [place], Danna Hefty, MD  ? 3 months ago Cognitive dysfunction  ? [facility] [place], Danna Hefty, MD  ? 3 months ago Acute cough  ? [facility] Alba Cory, MD  ? 6 months ago Rash  ? [facility] Dense M, DO  ? 6 months ago Hyperlipidemia associated with type 2 diabetes mellitus (HCC)  ? [facility] Alba Cory, MD  ? ?  ?  ?Future Appointments   ? ?        ? In 3 weeks  [facility]  ? In 1 month Alba Cory, MD [facility], [facility]  ? ?  ? ? ?  ?  ?  ? tiZANidine (ZANAFLEX) 2 MG tablet 90 tablet 0  ?  Sig: TAKE 1 TABLET BY MOUTH EVERY DAY IN THE EVENING  ?  ? Not Delegated - Cardiovascular:  Alpha-2 Agonists - tizanidine Failed - 08/27/2021  2:24 PM  ?  ?  Failed - This refill cannot be delegated  ?  ?  Passed - Valid encounter within last 6  months  ?  Recent Outpatient Visits   ? ?      ? 3 months ago Uncontrolled type 2 diabetes mellitus with hyperglycemia (HCC)  ? [facility] [place], Danna Hefty, MD  ? 3 months ago Cognitive dysfunction  ? [facility] [place], Danna Hefty, MD  ? 3 months ago Acute cough  ? [facility] Alba Cory, MD  ? 6 months ago Rash  ? [facility] [place] D[place], DO  ? 6 months ago Hyperlipidemia associated with type 2 diabetes mellitus (HCC)  ? [facility] Alba Cory, MD  ? ?  ?  ?Future Appointments   ? ?        ? In 3 weeks  [facility], [facility]  ? In 1 month Alba Cory, MD [facility]  ? ?  ? ? ?  ?  ?  ? ? ? ?Requested Prescriptions  ?Pending Prescriptions Disp Refills  ? pregabalin (LYRICA) 150 MG capsule 90 capsule 0  ?  Sig:  Take 1 capsule (150 mg total) by mouth at bedtime.  ?  ? Not Delegated - Neurology:  Anticonvulsants - Controlled - pregabalin Failed - 08/27/2021  2:24 PM  ?  ?  Failed - This refill cannot be delegated  ?  ?  Passed - Cr in normal range and within 360 days  ?  Creat  ?Date Value Ref Range Status  ?05/08/2021 0.98 0.70 - 1.28 mg/dL Final  ? ?Creatinine, Urine  ?Date Value Ref Range Status  ?04/06/2019 109 20 - 320 mg/dL Final  ?  ?  ?  ?  Passed - Completed PHQ-2 or PHQ-9 in the last 360 days  ?  ?  Passed - Valid encounter within last 12 months  ?  Recent Outpatient Visits   ? ?      ? 3 months ago Uncontrolled type 2 diabetes mellitus with hyperglycemia (HCC)  ? [facility] [place], Danna Hefty, MD  ? 3 months ago Cognitive dysfunction  ? [facility] [place], Danna Hefty, MD  ? 3 months ago Acute cough  ? [facility] Alba Cory, MD  ? 6 months ago Rash  ? [facility] [place] Dense M, DO  ? 6 months ago Hyperlipidemia associated with type 2 diabetes mellitus (HCC)  ? [facility] Alba Cory, MD  ? ?  ?  ?Future Appointments   ? ?        ? In 3 weeks  [facility], [facility]  ? In 1 month Alba Cory, MD [facility], [facility]  ? ?  ? ? ?  ?  ?  ? tiZANidine (ZANAFLEX) 2 MG tablet 90 tablet 0  ?  Sig: TAKE 1 TABLET BY MOUTH EVERY DAY IN THE EVENING  ?  ? Not Delegated - Cardiovascular:  Alpha-2 Agonists - tizanidine Failed - 08/27/2021  2:24 PM  ?  ?  Failed - This refill cannot be delegated  ?  ?  Passed - Valid encounter within last 6 months  ?  Recent Outpatient Visits   ? ?      ? 3 months ago Uncontrolled type 2 diabetes mellitus with hyperglycemia (HCC)  ? [facility] [place], Danna Hefty, MD  ? 3 months ago Cognitive dysfunction  ? [facility] [place], Danna Hefty, MD  ? 3 months ago Acute cough  ? [facility] Alba Cory, MD  ? 6 months ago Rash  ? [facility] [place], DO  ? 6 months ago Hyperlipidemia associated with type 2 diabetes mellitus (HCC)  ? [facility] Alba Cory, MD  ? ?  ?  ?Future Appointments   ? ?        ? In 3 weeks  [facility]  ? In 1 month Alba Cory, MD [facility], [facility]  ? ?  ? ? ?  ?  ?  ? ? ? ?

## 2021-08-27 NOTE — Telephone Encounter (Signed)
Medication Refill - Medication:  ?pregabalin (LYRICA) 150 MG capsule 90 capsule 0 05/25/2021  ?tiZANidine (ZANAFLEX) 2 MG tablet ? ?Has the patient contacted their pharmacy? Yes.   ?(Agent: If no, request that the patient contact the pharmacy for the refill. If patient does not wish to contact the pharmacy document the reason why and proceed with request.) ?(Agent: If yes, when and what did the pharmacy advise?)ph calling ? ?Preferred Pharmacy (with phone number or street name):  ?[facility] - [place], [place] - [address]  ?[address] [place] [address]  ?Phone: [phone] Fax: [phone]  ?Hours: Not open 24 hours  ? ?Has the patient been seen for an appointment in the last year OR does the patient have an upcoming appointment? Yes.   ? ?Agent: Please be advised that RX refills may take up to 3 business days. We ask that you follow-up with your pharmacy. ? ?

## 2021-08-28 MED ORDER — TIZANIDINE HCL 2 MG PO TABS: ORAL_TABLET | ORAL | 0 refills | Status: DC

## 2021-08-28 MED ORDER — PREGABALIN 150 MG PO CAPS: 150.0000 mg | ORAL_CAPSULE | Freq: Every day | ORAL | 0 refills | Status: DC

## 2021-08-28 NOTE — Telephone Encounter (Signed)
Pt says he is doing fine and cannot do any appts right now.  ?

## 2021-08-31 MED ORDER — LOSARTAN POTASSIUM 100 MG PO TABS: ORAL_TABLET | ORAL | 1 refills | Status: DC

## 2021-08-31 NOTE — Addendum Note (Signed)
Addended by: Julious Payer A on: 08/31/2021 09:57 AM ? ? Modules accepted: Orders ? ?

## 2021-09-01 ENCOUNTER — Telehealth: Payer: Self-pay

## 2021-09-01 NOTE — Progress Notes (Signed)
? ? ?  Chronic Care Management ?Pharmacy Assistant  ? ?Name: William Parks  MRN: [id number] DOB: 1946-03-02 ? ?Patient's daughter called to be reminded of his telephone appointment with Angelena Sole, CPP on 09/02/2021 @ 1430 ? ?No answer, left message of appointment date, time and type of appointment (either telephone or in person). Left message to have all medications, supplements, blood pressure and/or blood sugar logs available during appointment and to return call if need to reschedule. ? ?Star Rating Drug: ?Atorvastatin 20 mg last filled on 08/05/2021 for a 30-Day supply with [facility] ?Losartan 100 mg last filled on 08/06/2021 for a 30-Day supply with [facility] ?Pioglitazone 15 mg last filled on 08/05/2021 for a 30-Day supply with [facility] ?Ozempic 0.5 mg last filled on 08/05/2021 for a 30-Day supply with [facility] ? ?Any gaps in medications fill history? No ? ?Care Gaps: ?Zoster Vaccines ?COVID-19 Vaccine Booster 3 ?Diabetic Eye Exam ?Diabetic Foot Exam ?A1C > 9 ?BP > 140/90 ? ?Adelene Idler, CPA/CMA ?Clinical Pharmacist Assistant ?Phone: [phone]  ? ?

## 2021-09-02 ENCOUNTER — Telehealth: Payer: Medicare Other

## 2021-09-02 NOTE — Progress Notes (Deleted)
Chronic Care Management Pharmacy Note  09/02/2021 Name:  William Parks MRN:  [id number] DOB:  June 16, 1945  Summary: Patient presents for CCM follow-up. Today's visit was 100% conducted with [name], patient's daughter who helps manage her father's medicines despite living in [place].   Recommendations/Changes made from today's visit: -Recommended to continue current medication  Plan: CPP follow-up 1 month after PCP follow-up.  Subjective: William Parks is an 76 y.o. year old male who is a primary patient of Steele Sizer, MD.  The CCM team was consulted for assistance with disease management and care coordination needs.    Engaged with patient by telephone for follow up visit in response to provider referral for pharmacy case management and/or care coordination services.   Consent to Services:  The patient was given information about Chronic Care Management services, agreed to services, and gave verbal consent prior to initiation of services.  Please see initial visit note for detailed documentation.   Patient Care Team: Steele Sizer, MD as PCP - General (Family Medicine) Vern Claude, [place] as Social Worker Germaine Pomfret, [facility] (Pharmacist)  Recent office visits: 05/25/2021 Steele Sizer, MD (PCP Office Visit) for Follow-up- Stopped: Ibuprofen 600 mg, Ipratropium Bromide due to patient completed course, Changed: Docusate Sodium to 100 mg every other day, lab order placed, Referral to [place], Referral to Endocrinology placed, No follow-up noted  05/08/2021 Steele Sizer, MD (PCP Office Visit) for Memory Test- No medication changes noted, Labor orders placed, Referral to Neurology placed, No follow-up noted   05/07/2021 Steele Sizer, MD (PCP Office Visit) for URI- Started: Azelastine HCL 0.1% use twice daily, Benzonatate 100-200 mg twice daily prn, Stopped: Methylprednisolone 4 mg (Patient completed Course) No orders placed, No follow-up noted    02/25/2021 Rory Percy, DO (PCP Office Visit) for Rash- Started: Methylprednisolone 4 mg taper, No orders placed, No follow-up noted  Recent consult visits: 07/30/21: Patient presented to Dr. Manuella Ghazi (Neurology) for follow-up.   Hospital visits: None in previous 6 months   Objective:  Lab Results  Component Value Date   CREATININE 0.98 05/08/2021   BUN 11 05/08/2021   EGFR 80 05/08/2021   GFRNONAA >60 09/09/2020   GFRAA 71 12/03/2019   NA 141 05/08/2021   K 3.8 05/08/2021   CALCIUM 9.7 05/08/2021   CO2 33 (H) 05/08/2021   GLUCOSE 240 (H) 05/08/2021    Lab Results  Component Value Date/Time   HGBA1C 11.0 (A) 05/25/2021 11:05 AM   HGBA1C 11.8 (A) 02/18/2021 11:04 AM   HGBA1C 10.5 (H) 08/27/2020 01:01 AM   HGBA1C 10.3 06/30/2020 12:00 AM   HGBA1C 7.0 (H) 09/05/2018 10:44 AM   HGBA1C 6.8 02/03/2018 10:21 AM   HGBA1C 6.8 (A) 02/03/2018 10:21 AM   HGBA1C 6.8 02/03/2018 10:21 AM   FRUCTOSAMINE 345 (H) 12/03/2019 03:28 PM   MICROALBUR 20 07/16/2020 12:06 PM   MICROALBUR 2.3 04/06/2019 12:00 AM   MICROALBUR 1.0 09/05/2018 10:44 AM    Last diabetic Eye exam: No results found for: HMDIABEYEEXA  Last diabetic Foot exam:  Lab Results  Component Value Date/Time   HMDIABFOOTEX Normal 06/30/2020 12:00 AM     Lab Results  Component Value Date   CHOL 135 08/27/2020   HDL 49 08/27/2020   LDLCALC 74 08/27/2020   TRIG 100 09/08/2020   CHOLHDL 2.8 08/27/2020       Latest Ref Rng & Units 05/08/2021    2:06 PM 09/08/2020    5:06 AM 09/04/2020    5:35 AM  Hepatic Function  Total Protein 6.1 - 8.1 g/dL 6.5   6.0   6.3    Albumin 3.5 - 5.0 g/dL  2.0   2.0    AST 10 - 35 U/L 14   30   21     ALT 9 - 46 U/L 15   14   19     Alk Phosphatase 38 - 126 U/L  43   54    Total Bilirubin 0.2 - 1.2 mg/dL 0.7   0.3   0.4      Lab Results  Component Value Date/Time   TSH 1.51 05/08/2021 02:06 PM   TSH 2.356 08/26/2020 03:29 PM   TSH 1.76 06/01/2016 10:37 AM       Latest Ref Rng &  Units 05/08/2021    2:06 PM 09/08/2020    5:06 AM 09/05/2020    4:49 AM  CBC  WBC 3.8 - 10.8 Thousand/uL 4.1   9.3   8.6    Hemoglobin 13.2 - 17.1 g/dL 13.1   10.1   9.8    Hematocrit 38.5 - 50.0 % 40.2   29.9   29.7    Platelets 140 - 400 Thousand/uL 284   294   325      Lab Results  Component Value Date/Time   VD25OH 27 (L) 06/01/2016 10:37 AM    Clinical ASCVD: No  The 10-year ASCVD risk score (Arnett DK, et al., 2019) is: 41.5%   Values used to calculate the score:     Age: 76 years     Sex: Male     Is Non-Hispanic African American: Yes     Diabetic: Yes     Tobacco smoker: No     Systolic Blood Pressure: 157 mmHg     Is BP treated: Yes     HDL Cholesterol: 49 mg/dL     Total Cholesterol: 135 mg/dL       08/27/2021   11:55 AM 05/25/2021   10:53 AM 05/08/2021    1:02 PM  Depression screen PHQ 2/9  Decreased Interest 0 0 0  Down, Depressed, Hopeless 2 0 0  PHQ - 2 Score 2 0 0  Altered sleeping 3 0 0  Tired, decreased energy 1 0 0  Change in appetite 1 0 0  Feeling bad or failure about yourself  0 0 0  Trouble concentrating 1 0 0  Moving slowly or fidgety/restless 1 0 0  Suicidal thoughts 0 0 0  PHQ-9 Score 9 0 0  Difficult doing work/chores Not difficult at all      Social History   Tobacco Use  Smoking Status Former   Packs/day: 0.25   Years: 50.00   Pack years: 12.50   Types: Cigarettes   Start date: 07/27/1967   Quit date: 08/27/2020   Years since quitting: 1.0  Smokeless Tobacco Never   BP Readings from Last 3 Encounters:  05/25/21 (!) 142/70  05/08/21 138/82  05/07/21 140/86   Pulse Readings from Last 3 Encounters:  05/25/21 67  05/08/21 82  05/07/21 90   Wt Readings from Last 3 Encounters:  05/25/21 174 lb (78.9 kg)  05/08/21 172 lb (78 kg)  05/07/21 172 lb (78 kg)   BMI Readings from Last 3 Encounters:  05/25/21 27.25 kg/m  05/08/21 26.94 kg/m  05/07/21 26.94 kg/m    Assessment/Interventions: Review of patient past medical history,  allergies, medications, health status, including review of consultants reports, laboratory and other test data, was performed as part  of comprehensive evaluation and provision of chronic care management services.   SDOH:  (Social Determinants of Health) assessments and interventions performed: Yes   SDOH Screenings   Alcohol Screen: Low Risk    Last Alcohol Screening Score (AUDIT): 0  Depression (PHQ2-9): Medium Risk   PHQ-2 Score: 9  Financial Resource Strain: Low Risk    Difficulty of Paying Living Expenses: Not hard at all  Food Insecurity: No Food Insecurity   Worried About Charity fundraiser in the Last Year: Never true   Ran Out of Food in the Last Year: Never true  Housing: Low Risk    Last Housing Risk Score: 0  Physical Activity: Sufficiently Active   Days of Exercise per Week: 7 days   Minutes of Exercise per Session: 30 min  Social Connections: Moderately Isolated   Frequency of Communication with Friends and Family: More than three times a week   Frequency of Social Gatherings with Friends and Family: Three times a week   Attends Religious Services: More than 4 times per year   Active Member of Clubs or Organizations: No   Attends Archivist Meetings: Never   Marital Status: Divorced  Stress: No Stress Concern Present   Feeling of Stress : Only a little  Tobacco Use: Medium Risk   Smoking Tobacco Use: Former   Smokeless Tobacco Use: Never   Passive Exposure: Not on file  Transportation Needs: No Transportation Needs   Lack of Transportation (Medical): No   Lack of Transportation (Non-Medical): No    CCM Care Plan  No Known Allergies  Medications Reviewed Today     Reviewed by Clemetine Marker, LPN (Licensed Practical Nurse) on 08/27/21 at 97  Med List Status: <None>   Medication Order Taking? Sig Documenting Provider Last Dose Status Informant  aspirin 81 MG EC tablet [id number] Yes Take 81 mg by mouth daily. [provider] Taking Active  Self           Med Note Gloris Ham, [place] [employer] [facility]   Thu May 24, 2019 11:44 AM)    atorvastatin (LIPITOR) 20 MG tablet [id number] Yes Take 1 tablet (20 mg total) by mouth daily at 6 PM. New dose Steele Sizer, MD Taking Active   azelastine (ASTELIN) 0.1 % nasal spray [id number] Yes Place 2 sprays into both nostrils 2 (two) times daily. Use in each nostril as directed Steele Sizer, MD Taking Active   blood glucose meter kit and supplies [id number] Yes 1 each by Other route 2 (two) times daily as needed for other. Dispense based on patient and insurance preference. Use up to four times daily as directed. (FOR ICD-10 E10.9, E11.9). Uncontrolled DM Sowles, Drue Stager, MD Taking Active   COMFORT EZ PEN NEEDLES 31G X 6 MM MISC [id number] Yes USE one THREE TIMES DAILY AS NEEDED Ancil Boozer, Drue Stager, MD Taking Active   docusate sodium (COLACE) 100 MG capsule [id number] Yes Take 1 capsule (100 mg total) by mouth every other day. Steele Sizer, MD Taking Active   glucose blood (ONETOUCH ULTRA) test strip [id number] Yes Use to check blood sugars four times daily as instructed Steele Sizer, MD Taking Active   Insulin Lispro Prot & Lispro (HUMALOG 75/25 MIX) (75-25) 100 UNIT/ML Claiborne Rigg [id number] Yes INJECT into THE SKIN TWICE DAILY with meals. 30 UNITS IN THE MORNING AND 25 UNITS BEFORE SUPPER Steele Sizer, MD Taking Active   Lancets (ONETOUCH DELICA PLUS [id number]) [place] [id number] Yes Use to check blood sugars four times daily as instructed Sowles, Drue Stager,  MD Taking Active   losartan (COZAAR) 100 MG tablet [id number] Yes TAKE 1 TABLET(100 MG) BY MOUTH DAILY Sowles, Drue Stager, MD Taking Active   OZEMPIC, 0.25 OR 0.5 MG/DOSE, 2 MG/3ML SOPN [id number] Yes INJECT 0.5 MG into THE SKIN ONCE A WEEK Sowles, Drue Stager, MD Taking Active   pioglitazone (ACTOS) 15 MG tablet [id number] Yes Take 1 tablet (15 mg total) by mouth daily. Steele Sizer, MD Taking Active   pregabalin (LYRICA) 150 MG capsule [id number] Yes Take 1 capsule (150 mg  total) by mouth at bedtime. 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 ([place]) 05/25/2021   S/P right colectomy 11/14/2020   DM (diabetes mellitus), type 2, uncontrolled 08/26/2020   Atherosclerosis of aorta ([place]) 10/03/2019   Neck and shoulder pain 11/08/2016   Vitamin D deficiency 10/15/2015   Dyslipidemia 06/02/2015   Hypertension 04/01/2015   Uncontrolled type 2 diabetes mellitus with peripheral neuropathy 04/01/2015    Immunization History  Administered Date(s) Administered   Fluad Quad(high Dose 65+) 02/06/2019, 03/05/2020, 02/18/2021   Influenza, High Dose Seasonal PF 04/01/2015, 01/25/2017, 02/03/2018   Influenza,inj,Quad PF,6+ Mos 01/07/2016   PFIZER(Purple Top)SARS-COV-2 Vaccination 01/09/2020, 01/30/2020   Pneumococcal Conjugate-13 04/21/2016   Pneumococcal Polysaccharide-23 04/27/2017   Zoster Recombinat (Shingrix) 05/12/2018    Conditions to be addressed/monitored:  Hypertension, Hyperlipidemia, and Diabetes  There are no care plans that you recently modified to display for this patient.     Medication Assistance: None required.  Patient affirms current coverage meets needs.  Compliance/Adherence/Medication fill history: Care Gaps: Zoster Vaccine COVID-19 Vaccine Booster 3 Diabetic Eye Exam Diabetic Foot Exam BP > 140/90 A1C > 9  Star-Rating Drugs: Atorvastatin 20 mg last filled on 07/08/2021 for a 30-Day supply with [facility] Losartan 100 mg last filled on 07/08/2021 for a 30-Day supply with [facility] Pioglitazone 15 mg last filled on 07/08/2021 for a 30-Day supply with [facility]  Patient's preferred pharmacy is:  [place], [place] - [address]  [address] [place] [address] Phone: [phone] Fax: [id number] Lorina Rabon, [place] - [address] [place] [address] Phone: [phone] Fax: [phone]  Patient decided to: Utilize [facility] for medication synchronization, packaging and delivery  Care Plan and Follow Up Patient Decision:  Patient agrees to Care Plan and Follow-up.  Plan: Telephone follow up appointment with care management team member scheduled for:  09/02/2021 at 2:30 PM  Malva Limes, [facility] [phone]  Current Barriers:  Unable to achieve control of diabetes   Pharmacist Clinical Goal(s):  Patient will achieve control of diabetes as evidenced by A1c less than 8% through collaboration with PharmD and provider.   Interventions: 1:1 collaboration with Steele Sizer, MD regarding development and update of comprehensive plan of care as evidenced by provider attestation and co-signature Inter-disciplinary care team collaboration (see longitudinal plan of care) Comprehensive medication review performed; medication list updated in electronic medical record  Hypertension (BP goal <130/80) -Uncontrolled -Current treatment: Losartan 100 mg daily: Appropriate, Query effective -Medications previously tried: NA  -  Current home readings: Patient's daughter not with patient, did not have logbook available.  -Denies hypotensive/hypertensive symptoms -Recommended to continue current medication  Hyperlipidemia: (LDL goal < 70) -Uncontrolled -History of atherosclerosis  -Current treatment: Atorvastatin 20 mg daily: Appropriate, Query effective -Current treatment: Aspirin 81 mg daily: Appropriate, Effective, Safe, Accessible  -Medications previously tried: NA  -10-year ASVCD risk very high  -Recommend  increasing atorvastatin to 40 mg daily   Diabetes (A1c goal <8%) -Uncontrolled -Current medications: Humalog 75/25 30 units AM, 25 units PM: Appropriate, Query effective Pioglitazone 15 mg daily: Appropriate, Query effective Ozempic 0.5 mg weekly: Appropriate, Query effective -Medications previously tried: NA  -Current home glucose readings fasting glucose: Patient daughter not with patient, did not have logbook available.  -Denies hypoglycemic/hyperglycemic symptoms -Defer medication change recommendations until follow-up labwork, review of home glucose readings.  -Recommended to continue current medication  Patient Goals/Self-Care Activities Patient will:  - check glucose twice daily before breakfast and before supper, document, and provide at future appointments check blood pressure 2-3 times weekly, document, and provide at future appointments  Follow Up Plan: Telephone follow up appointment with care management team member scheduled for:  09/02/2021 at 2:30 PM

## 2021-09-17 ENCOUNTER — Ambulatory Visit: Payer: Medicare Other

## 2021-09-21 ENCOUNTER — Other Ambulatory Visit: Payer: Self-pay | Admitting: Family Medicine

## 2021-09-21 DIAGNOSIS — E1169 Type 2 diabetes mellitus with other specified complication: Secondary | ICD-10-CM

## 2021-09-21 DIAGNOSIS — E1165 Type 2 diabetes mellitus with hyperglycemia: Secondary | ICD-10-CM

## 2021-09-21 DIAGNOSIS — I700 Atherosclerosis of aorta: Secondary | ICD-10-CM

## 2021-09-22 ENCOUNTER — Other Ambulatory Visit: Payer: Self-pay

## 2021-09-22 DIAGNOSIS — E1169 Type 2 diabetes mellitus with other specified complication: Secondary | ICD-10-CM

## 2021-09-22 DIAGNOSIS — E1165 Type 2 diabetes mellitus with hyperglycemia: Secondary | ICD-10-CM

## 2021-09-22 DIAGNOSIS — I700 Atherosclerosis of aorta: Secondary | ICD-10-CM

## 2021-09-22 MED ORDER — INSULIN LISPRO PROT & LISPRO (75-25 MIX) 100 UNIT/ML KWIKPEN: PEN_INJECTOR | SUBCUTANEOUS | 0 refills | Status: DC

## 2021-09-28 ENCOUNTER — Telehealth: Payer: Self-pay

## 2021-09-28 NOTE — Progress Notes (Signed)
Chronic Care Management Pharmacy Assistant   Name: William Parks  MRN: [id number] DOB: 08/21/1945  Reason for Encounter: Medication Review/Medication Coordination for [facility]   Recent office visits:  08/27/2021 Clemetine Marker, LPN (PCP Clinical Support) Medicare Wellness Exam- Stopped: Benzonaate 100-200 mg, No orders placed,   Recent consult visits:  None ID  Hospital visits:  None in previous 6 months  Medications: Outpatient Encounter Medications as of 09/28/2021  Medication Sig   aspirin 81 MG EC tablet Take 81 mg by mouth daily.   atorvastatin (LIPITOR) 20 MG tablet Take 1 tablet (20 mg total) by mouth daily at 6 PM. New dose   azelastine (ASTELIN) 0.1 % nasal spray Place 2 sprays into both nostrils 2 (two) times daily. Use in each nostril as directed   blood glucose meter kit and supplies 1 each by Other route 2 (two) times daily as needed for other. Dispense based on patient and insurance preference. Use up to four times daily as directed. (FOR ICD-10 E10.9, E11.9). Uncontrolled DM   COMFORT EZ PEN NEEDLES 31G X 6 MM MISC USE one THREE TIMES DAILY AS NEEDED   docusate sodium (COLACE) 100 MG capsule Take 1 capsule (100 mg total) by mouth every other day.   glucose blood (ONETOUCH ULTRA) test strip Use to check blood sugars four times daily as instructed   Insulin Lispro Prot & Lispro (HUMALOG 75/25 MIX) (75-25) 100 UNIT/ML Kwikpen INJECT into THE SKIN TWICE DAILY with meals. 30 UNITS IN THE MORNING AND 25 UNITS BEFORE SUPPER   Lancets (ONETOUCH DELICA PLUS [id number]) MISC Use to check blood sugars four times daily as instructed   losartan (COZAAR) 100 MG tablet TAKE 1 TABLET(100 MG) BY MOUTH DAILY   OZEMPIC, 0.25 OR 0.5 MG/DOSE, 2 MG/3ML SOPN INJECT 0.5 MG into THE SKIN ONCE A WEEK   pioglitazone (ACTOS) 15 MG tablet Take 1 tablet (15 mg total) by mouth daily.   pregabalin (LYRICA) 150 MG capsule Take 1 capsule (150 mg total) by mouth at bedtime.   tiZANidine  (ZANAFLEX) 2 MG tablet TAKE 1 TABLET BY MOUTH EVERY DAY IN THE EVENING   No facility-administered encounter medications on file as of 09/28/2021.   Care Gaps: Tetanus/TDAP Zoster Vaccines COVID-19 Vaccine Booster 3 Diabetic Eye Exam Diabetic Foot Exam A1C > 9 BP > 140/90  Star Rating Drugs: Atorvastatin 20 mg last filled on 09/01/2021 for a 30-Day supply with [facility] Losartan 100 mg last filled on 09/01/2021 for a 30-Day supply with [facility] Pioglitazone 15 mg last filled on 09/01/2021 for a 30-Day supply with [facility] Ozempic 0.5 mg last filled on 09/01/2021 for a 30-Day supply with [facility]  BP Readings from Last 3 Encounters:  05/25/21 (!) 142/70  05/08/21 138/82  05/07/21 140/86    Lab Results  Component Value Date   HGBA1C 11.0 (A) 05/25/2021   Patient obtains medications through Adherence Packaging  30 Days   Last adherence delivery included:  Pen Needles 31 g 6 mm use three times daily Docusate Sodium 100 mg 1 capsule every other day (Bedtime) Atorvastatin 20 mg take 1 tablet daily (Bedtime) Pioglitazone 150 mg 1 capsule daily (Bedtime) Pregabalin 150 mg 1 capsule daily (Bedtime) Tizanidine 2 mg 1 tablet daily (Bedtime) Losartan 100 mg 1 tablet daily (Bedtime) Ozempic 2 mg/44m Inject 0.5 mg weekly Lancets OneTouch Delica Plus OneTouch Ultra Test Strips  Patient declined the following medications: No medications were declined  Patient is due for next adherence delivery  on: 10/08/2021 (Thursday) 1[address] Route.  Called patient and reviewed medications and coordinated delivery but outreach was not successful. Delivery will be based on last months delivery.   This delivery to include: Pen Needles 31 g 6 mm use three times daily Docusate Sodium 100 mg 1 capsule every other day (Bedtime) Atorvastatin 20 mg take 1 tablet daily (Bedtime) Pioglitazone 150 mg 1 capsule daily (Bedtime) Pregabalin 150 mg 1 capsule daily  (Bedtime) Tizanidine 2 mg 1 tablet daily (Bedtime) Losartan 100 mg 1 tablet daily (Bedtime) Ozempic 2 mg/2m Inject 0.5 mg weekly Lancets OneTouch Delica Plus OneTouch Ultra Test Strips  Patient declined the following medications for the month of June:  Patient needs refills for: Pen Needles 31 g 6 mm Ozempic 2 mg/3 mL Both medications are PCP medications and CPP can send refill.  Patient has telephone follow-up appointment with AJunius Argyle CPP on 10/28/2021 @ 1145  Unable to reach patient to completed Medication Coordination form. Form was completed based on last month delivery. [facility] will contact patient to confirm delivery. AJunius Argyle CPP was notified I was unable to reach the patient.  06/05 Message left for patient's daughter to return my call 06/06 Message left for patient's daughter to return my call 06/07 Message left for patient's daughter to return my call  TLynann Bologna CLaresPharmacist Assistant Phone: [phone]

## 2021-10-05 ENCOUNTER — Other Ambulatory Visit: Payer: Self-pay | Admitting: Family Medicine

## 2021-10-05 DIAGNOSIS — E1165 Type 2 diabetes mellitus with hyperglycemia: Secondary | ICD-10-CM

## 2021-10-06 ENCOUNTER — Ambulatory Visit: Payer: Medicare Other | Admitting: Family Medicine

## 2021-10-15 NOTE — Progress Notes (Unsigned)
Name: William Parks   MRN: [id number]    DOB: 10/19/1945   Date:10/15/2021       Progress Note  Subjective  Chief Complaint  Follow Up  HPI  DMII: he was seen back in April 2021 and A1C was 12.4 %,  11.1 %  to 9.2 % and back in May  2022 10.5 % , up to 11.8 %  down to 11 % . He is now on dose of insulin  75/25 he is supposed to be taking 30 in am and 25 in pm, and also on  Ozempic 0.5 once a week, however daughter, William Parks, came in with him today and states she has been at the house since last Thursday and have not seen him inject himself yet. Marland Kitchen He has associated HTN, dyslipidemia and neuropathy. He is taking Lyrica as prescribed for diabetic neuropathy and seems to be controlling symptoms. He cannot tolerate Metformin but is taking Actos. We will send rx to [facility] so we can monitor compliance. He does not want to go see Endo, but daughter wants him to go   HTN: he is taking losartan 100 daily but states not longer taking Metoprolol. BP is at goal . Denies chest pain or palpitation    Dyslipidemia: he is taking Atorvastatin, not sure how compliant he has been with medication    Tobacco use: he quit May 4 th, 2022 with the nicotine patch. No cough or wheezing, Doing well    Intermittent low back pain: he states doing well lately, Lyrica seems to help , takes Tizanidine prn only    Malnutrition: he states he used to be in the 210 lbs range, went down to 187 lbs gradually started to gain weight, but in May was admitted for rupture appendix and  after that had COVID-19:  He has lack of appetite and weight was down to 171 lbs , now at 174 lbs stable around that range. He has lost 10 % down from original weight    Atherosclerosis of aorta: on statin and aspirin daily , last LDL at 74 , he is now on Atorvastatin 20 mg but last bottle filled in October and bottle is still full. 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 ([place]) 05/25/2021   S/P right colectomy 11/14/2020   DM (diabetes mellitus), type 2, uncontrolled 08/26/2020   Atherosclerosis of aorta ([place]) 10/03/2019   Neck and shoulder pain 11/08/2016   Vitamin D deficiency 10/15/2015   Dyslipidemia 06/02/2015   Hypertension 04/01/2015   Uncontrolled type 2 diabetes mellitus with peripheral neuropathy 04/01/2015    Past Surgical History:  Procedure Laterality Date   APPENDECTOMY     CYST REMOVAL TRUNK     2x   LAPAROSCOPY N/A 09/01/2020   Procedure: LAPAROSCOPY DIAGNOSTIC;  Surgeon: Olean Ree, MD;  Location: [facility];  Service: General;  Laterality: N/A;   PARTIAL COLECTOMY N/A 09/01/2020   Procedure: PARTIAL RIGHT COLECTOMY;  Surgeon: Olean Ree, MD;  Location: [facility];  Service: General;  Laterality: N/A;   SHOULDER SURGERY Right 2009   rotator cuff surgery    Family History  Problem Relation Age of Onset   Cancer Mother        Breast CA  Diabetes Mother    Hypertension Mother    Diabetes Brother     Social History   Tobacco Use   Smoking status: Former    Packs/day: 0.25    Years: 50.00    Total pack years: 12.50    Types: Cigarettes    Start date: 07/27/1967    Quit date: 08/27/2020    Years since quitting: 1.1   Smokeless tobacco: Never  Substance Use Topics   Alcohol use: No    Alcohol/week: 0.0 standard drinks of alcohol     Current Outpatient Medications:    aspirin 81 MG EC tablet, Take 81 mg by mouth daily., Disp: , Rfl: 5   atorvastatin (LIPITOR) 20 MG tablet, Take 1 tablet (20 mg total) by mouth daily at 6 PM. New dose, Disp: 90 tablet, Rfl: 1   azelastine (ASTELIN) 0.1 % nasal spray, Place 2 sprays into both nostrils 2 (two) times daily. Use in each nostril as directed, Disp: 30 mL, Rfl: 2   blood glucose meter kit and  supplies, 1 each by Other route 2 (two) times daily as needed for other. Dispense based on patient and insurance preference. Use up to four times daily as directed. (FOR ICD-10 E10.9, E11.9). Uncontrolled DM, Disp: 1 each, Rfl: 0   COMFORT EZ PEN NEEDLES 31G X 6 MM MISC, USE one THREE TIMES DAILY AS NEEDED, Disp: 100 each, Rfl: 1   docusate sodium (COLACE) 100 MG capsule, Take 1 capsule (100 mg total) by mouth every other day., Disp: 15 capsule, Rfl: 5   glucose blood (ONETOUCH ULTRA) test strip, Use to check blood sugars four times daily as instructed, Disp: 100 strip, Rfl: 11   Insulin Lispro Prot & Lispro (HUMALOG 75/25 MIX) (75-25) 100 UNIT/ML Kwikpen, INJECT into THE SKIN TWICE DAILY with meals. 30 UNITS IN THE MORNING AND 25 UNITS BEFORE SUPPER, Disp: 15 mL, Rfl: 0   Lancets (ONETOUCH DELICA PLUS [id number]) MISC, Use to check blood sugars four times daily as instructed, Disp: 100 each, Rfl: 11   losartan (COZAAR) 100 MG tablet, TAKE 1 TABLET(100 MG) BY MOUTH DAILY, Disp: 90 tablet, Rfl: 1   OZEMPIC, 0.25 OR 0.5 MG/DOSE, 2 MG/3ML SOPN, INJECT 0.5 MG into THE SKIN ONCE A WEEK, Disp: 6 mL, Rfl: 0   pioglitazone (ACTOS) 15 MG tablet, Take 1 tablet (15 mg total) by mouth daily., Disp: 90 tablet, Rfl: 1   pregabalin (LYRICA) 150 MG capsule, Take 1 capsule (150 mg total) by mouth at bedtime., Disp: 90 capsule, Rfl: 0   tiZANidine (ZANAFLEX) 2 MG tablet, TAKE 1 TABLET BY MOUTH EVERY DAY IN THE EVENING, Disp: 90 tablet, Rfl: 0  No Known Allergies  I personally reviewed active problem list, medication list, allergies, family history, social history, health maintenance with the patient/caregiver today.   ROS  ***  Objective  There were no vitals filed for this visit.  There is no height or weight on file to calculate BMI.  Physical Exam ***  No results found for this or any previous visit (from the past 2160 hour(s)).  Diabetic Foot Exam: Diabetic Foot Exam - Simple   No data filed     ***  PHQ2/9:    08/27/2021   11:55 AM 05/25/2021   10:53 AM 05/08/2021    1:02 PM 05/07/2021   11:24 AM 02/25/2021    1:42 PM  Depression screen PHQ 2/9  Decreased Interest 0 0 0 0 0  Down, Depressed, Hopeless 2 0 0 0  0  PHQ - 2 Score 2 0 0 0 0  Altered sleeping 3 0 0 0 0  Tired, decreased energy 1 0 0 0 0  Change in appetite 1 0 0 0 0  Feeling bad or failure about yourself  0 0 0 0 0  Trouble concentrating 1 0 0 0 0  Moving slowly or fidgety/restless 1 0 0 0 0  Suicidal thoughts 0 0 0 0 0  PHQ-9 Score 9 0 0 0 0  Difficult doing work/chores Not difficult at all    Not difficult at all    phq 9 is {gen pos [id number]}   Fall Risk:    08/27/2021   11:59 AM 05/25/2021   10:53 AM 05/08/2021    1:02 PM 05/07/2021   11:23 AM 02/25/2021    1:42 PM  Fall Risk   Falls in the past year? 0 1 0 0 0  Number falls in past yr: 0 1 0 0 0  Injury with Fall? 0 0 0 0 0  Risk for fall due to : No Fall Risks Impaired balance/gait No Fall Risks No Fall Risks   Follow up Falls prevention discussed Falls prevention discussed Falls prevention discussed Falls prevention discussed       Functional Status Survey:      Assessment & Plan  *** There are no diagnoses linked to this encounter.

## 2021-10-16 ENCOUNTER — Ambulatory Visit (INDEPENDENT_AMBULATORY_CARE_PROVIDER_SITE_OTHER): Payer: Medicare Other | Admitting: Family Medicine

## 2021-10-16 ENCOUNTER — Encounter: Payer: Self-pay | Admitting: Family Medicine

## 2021-10-16 VITALS — BP 132/68 | HR 86 | Resp 16 | Ht 68.0 in | Wt 183.0 lb

## 2021-10-16 DIAGNOSIS — I10 Essential (primary) hypertension: Secondary | ICD-10-CM

## 2021-10-16 DIAGNOSIS — E1169 Type 2 diabetes mellitus with other specified complication: Secondary | ICD-10-CM | POA: Diagnosis not present

## 2021-10-16 DIAGNOSIS — E1142 Type 2 diabetes mellitus with diabetic polyneuropathy: Secondary | ICD-10-CM | POA: Diagnosis not present

## 2021-10-16 DIAGNOSIS — I700 Atherosclerosis of aorta: Secondary | ICD-10-CM

## 2021-10-16 DIAGNOSIS — E1143 Type 2 diabetes mellitus with diabetic autonomic (poly)neuropathy: Secondary | ICD-10-CM

## 2021-10-16 DIAGNOSIS — E785 Hyperlipidemia, unspecified: Secondary | ICD-10-CM

## 2021-10-16 DIAGNOSIS — E559 Vitamin D deficiency, unspecified: Secondary | ICD-10-CM

## 2021-10-16 DIAGNOSIS — F09 Unspecified mental disorder due to known physiological condition: Secondary | ICD-10-CM

## 2021-10-16 DIAGNOSIS — Z23 Encounter for immunization: Secondary | ICD-10-CM

## 2021-10-16 DIAGNOSIS — E441 Mild protein-calorie malnutrition: Secondary | ICD-10-CM

## 2021-10-16 LAB — POCT GLYCOSYLATED HEMOGLOBIN (HGB A1C): Hemoglobin A1C: 7.9 % — AB (ref 4.0–5.6)

## 2021-10-16 MED ORDER — SHINGRIX 50 MCG/0.5ML IM SUSR: 0.5000 mL | Freq: Once | INTRAMUSCULAR | 0 refills | Status: AC

## 2021-10-16 MED ORDER — TETANUS-DIPHTH-ACELL PERTUSSIS 5-2-15.5 LF-MCG/0.5 IM SUSP: 0.5000 mL | Freq: Once | INTRAMUSCULAR | 0 refills | Status: AC

## 2021-10-19 ENCOUNTER — Other Ambulatory Visit: Payer: Self-pay | Admitting: Internal Medicine

## 2021-10-19 DIAGNOSIS — E1165 Type 2 diabetes mellitus with hyperglycemia: Secondary | ICD-10-CM

## 2021-10-19 DIAGNOSIS — E1169 Type 2 diabetes mellitus with other specified complication: Secondary | ICD-10-CM

## 2021-10-19 DIAGNOSIS — I700 Atherosclerosis of aorta: Secondary | ICD-10-CM

## 2021-10-23 ENCOUNTER — Ambulatory Visit: Payer: Medicare Other | Admitting: Family Medicine

## 2021-10-25 ENCOUNTER — Other Ambulatory Visit: Payer: Self-pay | Admitting: Family Medicine

## 2021-10-25 DIAGNOSIS — I152 Hypertension secondary to endocrine disorders: Secondary | ICD-10-CM

## 2021-10-25 DIAGNOSIS — E1169 Type 2 diabetes mellitus with other specified complication: Secondary | ICD-10-CM

## 2021-10-26 ENCOUNTER — Telehealth: Payer: Self-pay

## 2021-10-26 DIAGNOSIS — E785 Hyperlipidemia, unspecified: Secondary | ICD-10-CM

## 2021-10-26 DIAGNOSIS — I152 Hypertension secondary to endocrine disorders: Secondary | ICD-10-CM

## 2021-10-26 NOTE — Progress Notes (Signed)
Chronic Care Management Pharmacy Assistant   Name: William Parks  MRN: [id number] DOB: [birth year]  Reason for Encounter: Medication Review/Medication Coordination Call for [facility]   Recent office visits:  10/16/2021 Steele Sizer, MD (PCP Office Visit) for Follow-up- No medication changes noted, Lab order placed, DM Foot Exam Order Placed, Patient to follow-up in 4 months  Recent consult visits:  None ID  Hospital visits:  None in previous 6 months  Medications: Outpatient Encounter Medications as of 10/26/2021  Medication Sig   aspirin 81 MG EC tablet Take 81 mg by mouth daily.   atorvastatin (LIPITOR) 20 MG tablet Take 1 tablet (20 mg total) by mouth daily at 6 PM. New dose   azelastine (ASTELIN) 0.1 % nasal spray Place 2 sprays into both nostrils 2 (two) times daily. Use in each nostril as directed   blood glucose meter kit and supplies 1 each by Other route 2 (two) times daily as needed for other. Dispense based on patient and insurance preference. Use up to four times daily as directed. (FOR ICD-10 E10.9, E11.9). Uncontrolled DM   COMFORT EZ PEN NEEDLES 31G X 6 MM MISC USE one THREE TIMES DAILY AS NEEDED   docusate sodium (COLACE) 100 MG capsule Take 1 capsule (100 mg total) by mouth every other day.   glucose blood (ONETOUCH ULTRA) test strip Use to check blood sugars four times daily as instructed   Insulin Lispro Prot & Lispro (HUMALOG 75/25 MIX) (75-25) 100 UNIT/ML Kwikpen INJECT into THE SKIN TWICE DAILY with meals. 30 UNITS IN THE MORNING AND 25 UNITS BEFORE SUPPER   Lancets (ONETOUCH DELICA PLUS [id number]) MISC Use to check blood sugars four times daily as instructed   losartan (COZAAR) 100 MG tablet TAKE 1 TABLET(100 MG) BY MOUTH DAILY   OZEMPIC, 0.25 OR 0.5 MG/DOSE, 2 MG/3ML SOPN INJECT 0.5 MG into THE SKIN ONCE A WEEK   pioglitazone (ACTOS) 15 MG tablet Take 1 tablet (15 mg total) by mouth daily.   pregabalin (LYRICA) 150 MG capsule Take 1 capsule (150 mg  total) by mouth at bedtime.   tiZANidine (ZANAFLEX) 2 MG tablet TAKE 1 TABLET BY MOUTH EVERY DAY IN THE EVENING   No facility-administered encounter medications on file as of 10/26/2021.   Care Gaps: Tetanus/TDAP Zoster Vaccines COVID-19 Vaccine Booster 3 Diabetic Eye Exam  Star Rating Drugs: Atorvastatin 20 mg last filled on 10/05/2021 for a 30-Day supply with [facility] Losartan 100 mg last filled on 10/05/2021 for a 30-Day supply with [facility] Pioglitazone 15 mg last filled on 10/05/2021 for a 30-Day supply with [facility] Ozempic 0.5 mg last filled on 10/05/2021 for a 30-Day supply with [facility]  Reviewed chart for medication changes ahead of medication coordination call.  No OVs, Consults, or hospital visits since last care coordination call/Pharmacist visit. (If appropriate, list visit date, provider name)  No medication changes indicated OR if recent visit, treatment plan here.  BP Readings from Last 3 Encounters:  10/16/21 132/68  05/25/21 (!) 142/70  05/08/21 138/82    Lab Results  Component Value Date   HGBA1C 7.9 (A) 10/16/2021    Patient obtains medications through Adherence Packaging  30 Days   Last adherence delivery included:   Pen Needles 31 g 6 mm use three times daily Docusate Sodium 100 mg 1 capsule every other day (Bedtime) Atorvastatin 20 mg take 1 tablet daily (Bedtime) Pioglitazone 150 mg 1 capsule daily (Bedtime) Pregabalin 150 mg 1 capsule daily (Bedtime) Tizanidine  2 mg 1 tablet daily (Bedtime) Losartan 100 mg 1 tablet daily (Bedtime) Ozempic 2 mg/49m Inject 0.5 mg weekly Lancets OneTouch Delica Plus OneTouch Ultra Test Strips  Patient declined the following medications last month: No medications were declined  Patient is due for next adherence delivery on: 11/06/2021 (Friday) 1st route.  Called patient and reviewed medications and coordinated delivery.  This delivery to include:  Pen Needles 31 g 6 mm use  three times daily Docusate Sodium 100 mg 1 capsule every other day (Bedtime) Atorvastatin 20 mg take 1 tablet daily (Bedtime) Pioglitazone 150 mg 1 capsule daily (Bedtime) Pregabalin 150 mg 1 capsule daily (Bedtime) Tizanidine 2 mg 1 tablet daily (Bedtime) Losartan 100 mg 1 tablet daily (Bedtime) Ozempic 2 mg/374mInject 0.5 mg weekly Lancets OneTouch Delica Plus OneTouch Ultra Test Strips  Patient declined the following medications for the month of July: No medications were declined  Patient needs refills for Pioglitazone 150 mg (PCP Medication) CPP can send refill for this medication.  Confirmed delivery date of 11/06/2021, advised patient that pharmacy will contact them the morning of delivery.  Patient's daughter rescheduled telephone visit to 11/25/2021 @ 1500.   ToLynann BolognaCPA/CMA Clinical Pharmacist Assistant Phone: [phone]

## 2021-10-28 ENCOUNTER — Telehealth: Payer: Medicare Other

## 2021-11-19 ENCOUNTER — Other Ambulatory Visit: Payer: Self-pay | Admitting: Family Medicine

## 2021-11-19 DIAGNOSIS — E785 Hyperlipidemia, unspecified: Secondary | ICD-10-CM

## 2021-11-24 ENCOUNTER — Telehealth: Payer: Self-pay

## 2021-11-24 NOTE — Progress Notes (Signed)
    Chronic Care Management Pharmacy Assistant   Name: William Parks  MRN: [id number] DOB: Nov 29, 1945  Patient's daughter called to be reminded of the patient's telephone appointment with Angelena Sole, CPP on 11/25/2021 @ 1500 his time and 1200 her time.  No answer, left message of appointment date, time and type of appointment (either telephone or in person). Left message to have all medications, supplements, blood pressure and/or blood sugar logs available during appointment and to return call if need to reschedule.  Star Rating Drug: Atorvastatin 20 mg last filled on 10/30/2021 for a 30-Day supply with [facility] Losartan 100 mg last filled on 10/30/2021 for a 30-Day supply with [facility] Pioglitazone 15 mg last filled on 10/30/2021 for a 30-Day supply with [facility] Ozempic 0.5 mg last filled on 10/30/2021 for a 30-Day supply with [facility]  Any gaps in medications fill history? None  Care Gaps: Tetanus/TDAP Zoster Vaccines COVID-19 Vaccine Booster 3 Diabetic Eye Exam   Adelene Idler, CPA/CMA Clinical Pharmacist Assistant Phone: [phone]

## 2021-11-25 ENCOUNTER — Ambulatory Visit: Payer: Medicare Other

## 2021-11-25 DIAGNOSIS — E1142 Type 2 diabetes mellitus with diabetic polyneuropathy: Secondary | ICD-10-CM

## 2021-11-25 DIAGNOSIS — E785 Hyperlipidemia, unspecified: Secondary | ICD-10-CM

## 2021-11-25 NOTE — Progress Notes (Signed)
Chronic Care Management Pharmacy Note  11/25/2021 Name:  William Parks MRN:  [id number] DOB:  04/13/1946  Summary: Patient presents for CCM follow-up. Today's visit was 50% conducted with the patient and 50% conducted  with William Parks, patient's daughter who helps manage her father's medicines despite living in [place].   -Low appetite, finds himself struggling to eat. Despite this patient reports eating multiple concentrated carbohydrates throughout the day including Fried foods, corn, and beans. Picky about many non-starchy vegetables.  -Extensive dietary counseling was given today. Patient was counseled to administer insulin 15 minutes prior to breakfast and supper.     Recommendations/Changes made from today's visit: -Recommended to continue current medication. Asked patient to keep record of glucose and will defer medicine changes in lieu of dietary changes and adjustment of insulin administration.   Plan: CPP follow-up 1 month.  Subjective: William Parks is an 76 y.o. year old male who is a primary patient of Steele Sizer, MD.  The CCM team was consulted for assistance with disease management and care coordination needs.    Engaged with patient by telephone for follow up visit in response to provider referral for pharmacy case management and/or care coordination services.   Consent to Services:  The patient was given information about Chronic Care Management services, agreed to services, and gave verbal consent prior to initiation of services.  Please see initial visit note for detailed documentation.   Patient Care Team: Steele Sizer, MD as PCP - General (Family Medicine) Germaine Pomfret, [facility] (Pharmacist)  Recent office visits: 10/16/21: Patient presented to Dr. Ancil Boozer for follow-up. A1c 7.9%.   Recent consult visits: None ID  Hospital visits: None in previous 6 months   Objective:  Lab Results  Component Value Date   CREATININE 0.98 05/08/2021   BUN 11  05/08/2021   EGFR 80 05/08/2021   GFRNONAA >60 09/09/2020   GFRAA 71 12/03/2019   NA 141 05/08/2021   K 3.8 05/08/2021   CALCIUM 9.7 05/08/2021   CO2 33 (H) 05/08/2021   GLUCOSE 240 (H) 05/08/2021    Lab Results  Component Value Date/Time   HGBA1C 7.9 (A) 10/16/2021 11:10 AM   HGBA1C 11.0 (A) 05/25/2021 11:05 AM   HGBA1C 10.5 (H) 08/27/2020 01:01 AM   HGBA1C 10.3 06/30/2020 12:00 AM   HGBA1C 7.0 (H) 09/05/2018 10:44 AM   HGBA1C 6.8 02/03/2018 10:21 AM   HGBA1C 6.8 (A) 02/03/2018 10:21 AM   HGBA1C 6.8 02/03/2018 10:21 AM   FRUCTOSAMINE 345 (H) 12/03/2019 03:28 PM   MICROALBUR 20 07/16/2020 12:06 PM   MICROALBUR 2.3 04/06/2019 12:00 AM   MICROALBUR 1.0 09/05/2018 10:44 AM    Last diabetic Eye exam: No results found for: "HMDIABEYEEXA"  Last diabetic Foot exam:  Lab Results  Component Value Date/Time   HMDIABFOOTEX Normal 06/30/2020 12:00 AM     Lab Results  Component Value Date   CHOL 135 08/27/2020   HDL 49 08/27/2020   LDLCALC 74 08/27/2020   TRIG 100 09/08/2020   CHOLHDL 2.8 08/27/2020       Latest Ref Rng & Units 05/08/2021    2:06 PM 09/08/2020    5:06 AM 09/04/2020    5:35 AM  Hepatic Function  Total Protein 6.1 - 8.1 g/dL 6.5  6.0  6.3   Albumin 3.5 - 5.0 g/dL  2.0  2.0   AST 10 - 35 U/L 14  30  21    ALT 9 - 46 U/L 15  14  19    Alk  Phosphatase 38 - 126 U/L  43  54   Total Bilirubin 0.2 - 1.2 mg/dL 0.7  0.3  0.4     Lab Results  Component Value Date/Time   TSH 1.51 05/08/2021 02:06 PM   TSH 2.356 08/26/2020 03:29 PM   TSH 1.76 06/01/2016 10:37 AM       Latest Ref Rng & Units 05/08/2021    2:06 PM 09/08/2020    5:06 AM 09/05/2020    4:49 AM  CBC  WBC 3.8 - 10.8 Thousand/uL 4.1  9.3  8.6   Hemoglobin 13.2 - 17.1 g/dL 13.1  10.1  9.8   Hematocrit 38.5 - 50.0 % 40.2  29.9  29.7   Platelets 140 - 400 Thousand/uL 284  294  325     Lab Results  Component Value Date/Time   VD25OH 27 (L) 06/01/2016 10:37 AM    Clinical ASCVD: No  The 10-year ASCVD  risk score (Arnett DK, et al., 2019) is: 38.2%   Values used to calculate the score:     Age: 69 years     Sex: Male     Is Non-Hispanic African American: Yes     Diabetic: Yes     Tobacco smoker: No     Systolic Blood Pressure: 366 mmHg     Is BP treated: Yes     HDL Cholesterol: 49 mg/dL     Total Cholesterol: 135 mg/dL       10/16/2021   11:06 AM 08/27/2021   11:55 AM 05/25/2021   10:53 AM  Depression screen PHQ 2/9  Decreased Interest 0 0 0  Down, Depressed, Hopeless 0 2 0  PHQ - 2 Score 0 2 0  Altered sleeping  3 0  Tired, decreased energy  1 0  Change in appetite  1 0  Feeling bad or failure about yourself   0 0  Trouble concentrating  1 0  Moving slowly or fidgety/restless  1 0  Suicidal thoughts  0 0  PHQ-9 Score  9 0  Difficult doing work/chores  Not difficult at all     Social History   Tobacco Use  Smoking Status Former   Packs/day: 0.25   Years: 50.00   Total pack years: 12.50   Types: Cigarettes   Start date: 07/27/1967   Quit date: 08/27/2020   Years since quitting: 1.2  Smokeless Tobacco Never   BP Readings from Last 3 Encounters:  10/16/21 132/68  05/25/21 (!) 142/70  05/08/21 138/82   Pulse Readings from Last 3 Encounters:  10/16/21 86  05/25/21 67  05/08/21 82   Wt Readings from Last 3 Encounters:  10/16/21 183 lb (83 kg)  05/25/21 174 lb (78.9 kg)  05/08/21 172 lb (78 kg)   BMI Readings from Last 3 Encounters:  10/16/21 27.83 kg/m  05/25/21 27.25 kg/m  05/08/21 26.94 kg/m    Assessment/Interventions: Review of patient past medical history, allergies, medications, health status, including review of consultants reports, laboratory and other test data, was performed as part of comprehensive evaluation and provision of chronic care management services.   SDOH:  (Social Determinants of Health) assessments and interventions performed: Yes   SDOH Screenings   Alcohol Screen: Low Risk  (08/27/2021)   Alcohol Screen    Last Alcohol  Screening Score (AUDIT): 0  Depression (PHQ2-9): Low Risk  (10/16/2021)   Depression (PHQ2-9)    PHQ-2 Score: 0  Recent Concern: Depression (PHQ2-9) - Medium Risk (08/27/2021)   Depression (PHQ2-9)  PHQ-2 Score: 9  Financial Resource Strain: Low Risk  (08/27/2021)   Overall Financial Resource Strain (CARDIA)    Difficulty of Paying Living Expenses: Not hard at all  Food Insecurity: No Food Insecurity (08/27/2021)   Hunger Vital Sign    Worried About Running Out of Food in the Last Year: Never true    Ran Out of Food in the Last Year: Never true  Housing: Low Risk  (08/27/2021)   Housing    Last Housing Risk Score: 0  Physical Activity: Sufficiently Active (08/27/2021)   Exercise Vital Sign    Days of Exercise per Week: 7 days    Minutes of Exercise per Session: 30 min  Social Connections: Moderately Isolated (08/27/2021)   Social Connection and Isolation Panel [NHANES]    Frequency of Communication with Friends and Family: More than three times a week    Frequency of Social Gatherings with Friends and Family: Three times a week    Attends Religious Services: More than 4 times per year    Active Member of Clubs or Organizations: No    Attends Archivist Meetings: Never    Marital Status: Divorced  Stress: No Stress Concern Present (08/27/2021)   [place]    Feeling of Stress : Only a little  Tobacco Use: Medium Risk (10/16/2021)   Patient History    Smoking Tobacco Use: Former    Smokeless Tobacco Use: Never    Passive Exposure: Not on file  Transportation Needs: No Transportation Needs (08/27/2021)   PRAPARE - Hydrologist (Medical): No    Lack of Transportation (Non-Medical): No    CCM Care Plan  No Known Allergies  Medications Reviewed Today     Reviewed by Steele Sizer, MD (Physician) on 10/16/21 at 1121  Med List Status: <None>   Medication Order Taking? Sig Documenting  Provider Last Dose Status Informant  aspirin 81 MG EC tablet [id number] Yes Take 81 mg by mouth daily. [provider] Taking Active Self           Med Note Gloris Ham, [place] D   Thu May 24, 2019 11:44 AM)    atorvastatin (LIPITOR) 20 MG tablet [id number] Yes Take 1 tablet (20 mg total) by mouth daily at 6 PM. New dose Steele Sizer, MD Taking Active   azelastine (ASTELIN) 0.1 % nasal spray [id number] Yes Place 2 sprays into both nostrils 2 (two) times daily. Use in each nostril as directed Steele Sizer, MD Taking Active   blood glucose meter kit and supplies [id number] Yes 1 each by Other route 2 (two) times daily as needed for other. Dispense based on patient and insurance preference. Use up to four times daily as directed. (FOR ICD-10 E10.9, E11.9). Uncontrolled DM Sowles, Drue Stager, MD Taking Active   COMFORT EZ PEN NEEDLES 31G X 6 MM MISC [id number] Yes USE one THREE TIMES DAILY AS NEEDED Ancil Boozer, Drue Stager, MD Taking Active   docusate sodium (COLACE) 100 MG capsule [id number] Yes Take 1 capsule (100 mg total) by mouth every other day. Steele Sizer, MD Taking Active   glucose blood (ONETOUCH ULTRA) test strip [id number] Yes Use to check blood sugars four times daily as instructed Steele Sizer, MD Taking Active   Insulin Lispro Prot & Lispro (HUMALOG 75/25 MIX) (75-25) 100 UNIT/ML Claiborne Rigg [id number] Yes INJECT into THE SKIN TWICE DAILY with meals. [place] THE MORNING AND 25 UNITS BEFORE SUPPER Teodora Medici, DO Taking  Active   Lancets [facility] DELICA PLUS [id number]) [place] [id number] Yes Use to check blood sugars four times daily as instructed Steele Sizer, MD Taking Active   losartan (COZAAR) 100 MG tablet [id number] Yes TAKE 1 TABLET(100 MG) BY MOUTH DAILY Sowles, Drue Stager, MD Taking Active   OZEMPIC, 0.25 OR 0.5 MG/DOSE, 2 MG/3ML SOPN [id number] Yes INJECT 0.5 MG into THE SKIN ONCE A WEEK Sowles, Drue Stager, MD Taking Active   pioglitazone (ACTOS) 15 MG tablet [id number] Yes Take 1 tablet  (15 mg total) by mouth daily. Steele Sizer, MD Taking Active   pregabalin (LYRICA) 150 MG capsule [id number] Yes Take 1 capsule (150 mg total) by mouth at bedtime. Steele Sizer, MD Taking Active   tiZANidine (ZANAFLEX) 2 MG tablet [id number] Yes TAKE 1 TABLET BY MOUTH EVERY DAY IN THE Lorelei Pont, Drue Stager, MD Taking Active             Patient Active Problem List   Diagnosis Date Noted   Type 2 diabetes mellitus with peripheral neuropathy ([place]) 10/16/2021   S/P right colectomy 11/14/2020   Atherosclerosis of aorta ([place]) 10/03/2019   Neck and shoulder pain 11/08/2016   Vitamin D deficiency 10/15/2015   Dyslipidemia 06/02/2015   Hypertension, benign 04/01/2015    Immunization History  Administered Date(s) Administered   Fluad Quad(high Dose 65+) 02/06/2019, 03/05/2020, 02/18/2021   Influenza, High Dose Seasonal PF 04/01/2015, 01/25/2017, 02/03/2018   Influenza,inj,Quad PF,6+ Mos 01/07/2016   PFIZER(Purple Top)SARS-COV-2 Vaccination 01/09/2020, 01/30/2020   Pneumococcal Conjugate-13 04/21/2016   Pneumococcal Polysaccharide-23 04/27/2017   Zoster Recombinat (Shingrix) 05/12/2018    Conditions to be addressed/monitored:  Hypertension, Hyperlipidemia, and Diabetes  There are no care plans that you recently modified to display for this patient.     Medication Assistance: None required.  Patient affirms current coverage meets needs.  Compliance/Adherence/Medication fill history: Care Gaps: Zoster Vaccine COVID-19 Vaccine Booster 3 Diabetic Eye Exam Diabetic Foot Exam BP > 140/90 A1C > 9  Star-Rating Drugs: Atorvastatin 20 mg last filled on 07/08/2021 for a 30-Day supply with [facility] Losartan 100 mg last filled on 07/08/2021 for a 30-Day supply with [facility] Pioglitazone 15 mg last filled on 07/08/2021 for a 30-Day supply with [facility]  Patient's preferred pharmacy is:  [place], [place] - [address]  [address] [place] [address] Phone: [phone] Fax: [facility] #[phone] Lorina Rabon, [place] - [address] [place] [address] Phone: [phone] Fax: [phone]  Patient decided to: Utilize [facility] for medication synchronization, packaging and delivery  Care Plan and Follow Up Patient Decision:  Patient agrees to Care Plan and Follow-up.  Plan: Telephone follow up appointment with care management team member scheduled for:  01/06/2022 at 3:45 PM  Malva Limes, [place] Pharmacist Practitioner  [facility] [phone]

## 2021-11-27 NOTE — Patient Instructions (Signed)
Visit Information It was great speaking with you today!  Please let me know if you have any questions about our visit.   Goals Addressed               This Visit's Progress     "i want to lower my HgA1C" (pt-stated)   Not on track     During face to face interview, patient states she has not been performing independent self health promoting activities for diabetes management. She is requesting guidance and information about self management with goal of lowering HgA1C by 2 points over the next 3 months.    Clinical Goal(s): Over the next 30 days, patient will:  Monitor and document cbg daily as evidenced by review of cbg documentation tool Take all prescribed medications daily as evidenced by patient report and clinician review of adherence packs Utilize plate method as carbohydrate portion control tool, as evidenced by patient report  Interventions: Face to face diabetes education, utilizing teach back method, including recommendations and rationale for daily cbg monitoring/recording, carb modified dietary counseling, medication review and counseling; Emmi educational materials provided to patient       Monitor and Manage My Blood Sugar-Diabetes Type 2   On track     Timeframe:  Long-Range Goal Priority:  High Start Date: 07/22/21                            Expected End Date: 07/23/22                      Follow Up within 30 days   - check blood sugar at prescribed times - check blood sugar if I feel it is too high or too low - enter blood sugar readings and medication or insulin into daily log - take the blood sugar log to all doctor visits    Why is this important?   Checking your blood sugar at home helps to keep it from getting very high or very low.  Writing the results in a diary or log helps the doctor know how to care for you.  Your blood sugar log should have the time, date and the results.  Also, write down the amount of insulin or other medicine that you take.  Other  information, like what you ate, exercise done and how you were feeling, will also be helpful.     Notes:         Patient Care Plan: General Pharmacy (Adult)     Problem Identified: Hypertension, Hyperlipidemia, and Diabetes   Priority: High     Long-Range Goal: Patient-Specific Goal   Start Date: 07/22/2021  Expected End Date: 07/23/2022  This Visit's Progress: On track  Recent Progress: On track  Priority: High  Note:   Current Barriers:  Unable to achieve control of diabetes   Pharmacist Clinical Goal(s):  Patient will achieve control of diabetes as evidenced by A1c less than 8% through collaboration with PharmD and provider.   Interventions: 1:1 collaboration with Alba Cory, MD regarding development and update of comprehensive plan of care as evidenced by provider attestation and co-signature Inter-disciplinary care team collaboration (see longitudinal plan of care) Comprehensive medication review performed; medication list updated in electronic medical record  Hypertension (BP goal <130/80) -Uncontrolled -Current treatment: Losartan 100 mg daily: Appropriate, Query effective -Medications previously tried: NA  -Current home readings: Patient's daughter not with patient, did not have logbook available.  -Denies  hypotensive/hypertensive symptoms -Recommended to continue current medication  Hyperlipidemia: (LDL goal < 70) -Uncontrolled -History of atherosclerosis  -Current treatment: Atorvastatin 20 mg daily: Appropriate, Query effective -Current treatment: Aspirin 81 mg daily: Appropriate, Effective, Safe, Accessible  -Medications previously tried: NA  -10-year ASVCD risk very high  -Recommend increasing atorvastatin to 40 mg daily   Diabetes (A1c goal <8%) -Uncontrolled -Current medications: Humalog 75/25 30 units AM, 25 units PM: Appropriate, Query effective Administers with no regard to meal timing.  Pioglitazone 15 mg daily: Appropriate, Query  effective Ozempic 0.5 mg weekly: Appropriate, Query effective -Current medications: Pregabalin 150 mg nightly : Appropriate, Query effective -Medications previously tried: NA  -Current home glucose readings fasting glucose: 211, 187, 250 -Denies hypoglycemic/hyperglycemic symptoms -Low appetite, finds himself struggling to eat. Despite this patient reports eating multiple concentrated carbohydrates throughout the day including Fried foods, corn, and beans. Picky about many non-starchy vegetables.  -Extensive dietary counseling was given today. Patient was counseled to administer insulin 15 minutes prior to breakfast and supper.   -Recommended to continue current medication. Asked patient to keep record of glucose and will defer medicine changes in lieu of dietary changes and adjustment of insulin administration.   Patient Goals/Self-Care Activities Patient will:  - check glucose twice daily before breakfast and before supper, document, and provide at future appointments check blood pressure 2-3 times weekly, document, and provide at future appointments  Follow Up Plan: Telephone follow up appointment with care management team member scheduled for:  01/06/2022 at 3:45 PM      Patient agreed to services and verbal consent obtained.   The patient verbalized understanding of instructions, educational materials, and care plan provided today and DECLINED offer to receive copy of patient instructions, educational materials, and care plan.   Cheyenne Adas, CPP Clinical Pharmacist Practitioner  [facility] [phone]

## 2021-12-04 ENCOUNTER — Other Ambulatory Visit: Payer: Self-pay | Admitting: Family Medicine

## 2021-12-04 DIAGNOSIS — E1165 Type 2 diabetes mellitus with hyperglycemia: Secondary | ICD-10-CM

## 2021-12-04 DIAGNOSIS — E785 Hyperlipidemia, unspecified: Secondary | ICD-10-CM

## 2021-12-04 DIAGNOSIS — I700 Atherosclerosis of aorta: Secondary | ICD-10-CM

## 2021-12-07 ENCOUNTER — Other Ambulatory Visit: Payer: Self-pay | Admitting: Student

## 2021-12-07 DIAGNOSIS — G3184 Mild cognitive impairment, so stated: Secondary | ICD-10-CM

## 2021-12-08 ENCOUNTER — Other Ambulatory Visit: Payer: Self-pay | Admitting: Family Medicine

## 2021-12-08 DIAGNOSIS — M545 Low back pain, unspecified: Secondary | ICD-10-CM

## 2021-12-08 DIAGNOSIS — E1165 Type 2 diabetes mellitus with hyperglycemia: Secondary | ICD-10-CM

## 2021-12-08 NOTE — Telephone Encounter (Signed)
Requested medication (s) are due for refill today: yes  Requested medication (s) are on the active medication list: yes    Last refill: 08/28/21  #90  0 refills  Future visit scheduled yes 02/15/22  Notes to clinic: Not delegated, please review. Thank you.  Requested Prescriptions  Pending Prescriptions Disp Refills   tiZANidine (ZANAFLEX) 2 MG tablet [Pharmacy Med Name: tizanidine 2 mg tablet] 90 tablet 0    Sig: TAKE ONE TABLET BY MOUTH EVERYDAY AT BEDTIME     Not Delegated - Cardiovascular:  Alpha-2 Agonists - tizanidine Failed - 12/08/2021  1:54 PM      Failed - This refill cannot be delegated      Passed - Valid encounter within last 6 months    Recent Outpatient Visits           1 month ago Type 2 diabetes mellitus with peripheral neuropathy (HCC)   [facility] Al[facility] Cory, MD   6 months ago Uncontrolled type 2 diabetes mellitus with hyperglycemia [facility])   [facility] [place], Danna Hefty, MD   7 months ago Cognitive dysfunction   [facility] [place], Danna Hefty, MD   7 months ago Acute cough   [facility] [place], Danna Hefty, MD   9 months ago Rash   [facility] Caro Laroche, DO       Future Appointments             In 2 months Carlynn Purl, Danna Hefty, MD [facility]            Signed Prescriptions Disp Refills   OZEMPIC, 0.25 OR 0.5 MG/DOSE, 2 MG/3ML SOPN 6 mL 0    Sig: INJECT 0.5 MG into THE SKIN ONCE A WEEK     Endocrinology:  Diabetes - GLP-1 Receptor Agonists - semaglutide Failed - 12/08/2021  1:54 PM      Failed - HBA1C in normal range and within 180 days    Hemoglobin A1C  Date Value Ref Range Status  10/16/2021 7.9 (A) 4.0 - 5.6 % Final  06/30/2020 10.3  Final    Comment:    POC   Hgb A1c MFr Bld  Date Value Ref Range Status  08/27/2020 10.5 (H) 4.8 - 5.6 % Final    Comment:    (NOTE) Pre diabetes:          5.7%-6.4%  Diabetes:               >6.4%  Glycemic control for   <7.0% adults with diabetes          Passed - Cr in normal range and within 360 days    Creat  Date Value Ref Range Status  05/08/2021 0.98 0.70 - 1.28 mg/dL Final   Creatinine, Urine  Date Value Ref Range Status  04/06/2019 109 20 - 320 mg/dL Final         Passed - Valid encounter within last 6 months    Recent Outpatient Visits           1 month ago Type 2 diabetes mellitus with peripheral neuropathy [facility])   [facility] Alba Cory, MD   6 months ago Uncontrolled type 2 diabetes mellitus with hyperglycemia [facility])   [facility] Alba Cory, MD   7 months ago Cognitive dysfunction   [facility] Alba Cory, MD   7 months ago Acute cough   [facility] [place],  Danna Hefty, MD   9 months ago Rash   [facility] Caro Laroche, DO       Future Appointments             In 2 months Carlynn Purl, Danna Hefty, MD [facility]

## 2021-12-08 NOTE — Telephone Encounter (Signed)
Requested Prescriptions  Pending Prescriptions Disp Refills  . tiZANidine (ZANAFLEX) 2 MG tablet [Pharmacy Med Name: tizanidine 2 mg tablet] 90 tablet 0    Sig: TAKE ONE TABLET BY MOUTH EVERYDAY AT BEDTIME     Not Delegated - Cardiovascular:  Alpha-2 Agonists - tizanidine Failed - 12/08/2021  1:54 PM      Failed - This refill cannot be delegated      Passed - Valid encounter within last 6 months    Recent Outpatient Visits          1 month ago Type 2 diabetes mellitus with peripheral neuropathy [facility])   [facility] Alba Cory, MD   6 months ago Uncontrolled type 2 diabetes mellitus with hyperglycemia [facility])   [facility] Alba Cory, MD   7 months ago Cognitive dysfunction   [facility] Alba Cory, MD   7 months ago Acute cough   [facility] Alba Cory, MD   9 months ago Rash   [facility] Caro Laroche, DO      Future Appointments            In 2 months Alba Cory, MD [facility]           . OZEMPIC, 0.25 OR 0.5 MG/DOSE, 2 MG/3ML SOPN [Pharmacy Med Name: Ozempic 0.25 mg or 0.5 mg (2 mg/3 mL) subcutaneous pen injector] 6 mL 0    Sig: INJECT 0.5 MG into THE SKIN ONCE A WEEK     Endocrinology:  Diabetes - GLP-1 Receptor Agonists - semaglutide Failed - 12/08/2021  1:54 PM      Failed - HBA1C in normal range and within 180 days    Hemoglobin A1C  Date Value Ref Range Status  10/16/2021 7.9 (A) 4.0 - 5.6 % Final  06/30/2020 10.3  Final    Comment:    POC   Hgb A1c MFr Bld  Date Value Ref Range Status  08/27/2020 10.5 (H) 4.8 - 5.6 % Final    Comment:    (NOTE) Pre diabetes:          5.7%-6.4%  Diabetes:              >6.4%  Glycemic control for   <7.0% adults with diabetes          Passed - Cr in normal range and within 360 days    Creat  Date Value Ref Range Status  05/08/2021 0.98 0.70 - 1.28 mg/dL Final    Creatinine, Urine  Date Value Ref Range Status  04/06/2019 109 20 - 320 mg/dL Final         Passed - Valid encounter within last 6 months    Recent Outpatient Visits          1 month ago Type 2 diabetes mellitus with peripheral neuropathy [facility])   [facility] Alba Cory, MD   6 months ago Uncontrolled type 2 diabetes mellitus with hyperglycemia [facility])   [facility] Alba Cory, MD   7 months ago Cognitive dysfunction   [facility] Alba Cory, MD   7 months ago Acute cough   [facility] Alba Cory, MD   9 months ago Rash   [facility] Caro Laroche, DO      Future Appointments            In 2 months Alba Cory, MD [facility]  [place]

## 2021-12-18 ENCOUNTER — Ambulatory Visit: Payer: Medicare Other

## 2021-12-23 ENCOUNTER — Ambulatory Visit: Admission: RE | Admit: 2021-12-23 | Payer: Medicare Other | Source: Ambulatory Visit

## 2021-12-24 ENCOUNTER — Telehealth: Payer: Self-pay

## 2021-12-24 NOTE — Progress Notes (Signed)
Chronic Care Management Pharmacy Assistant   Name: William Parks  MRN: [id number] DOB: 14-Sep-1945  Reason for Encounter: Medication Review/Medication Coordination for [facility]   Recent office visits:  None ID  Recent consult visits:  12/04/2021  Kerry Dory, [place] (Neurology)- I am unable to view the note  Hospital visits:  None in previous 6 months  Medications: Outpatient Encounter Medications as of 12/24/2021  Medication Sig   aspirin 81 MG EC tablet Take 81 mg by mouth daily.   atorvastatin (LIPITOR) 20 MG tablet TAKE ONE TABLET BY MOUTH EVERYDAY AT BEDTIME   azelastine (ASTELIN) 0.1 % nasal spray Place 2 sprays into both nostrils 2 (two) times daily. Use in each nostril as directed   blood glucose meter kit and supplies 1 each by Other route 2 (two) times daily as needed for other. Dispense based on patient and insurance preference. Use up to four times daily as directed. (FOR ICD-10 E10.9, E11.9). Uncontrolled DM   COMFORT EZ PEN NEEDLES 31G X 6 MM MISC USE one THREE TIMES DAILY AS NEEDED   docusate sodium (COLACE) 100 MG capsule TAKE ONE CAPSULE BY MOUTH Every Other Day at Bedtime   glucose blood (ONETOUCH ULTRA) test strip Use to check blood sugars four times daily as instructed   Insulin Lispro Prot & Lispro (HUMALOG 75/25 MIX) (75-25) 100 UNIT/ML Kwikpen INJECT into THE SKIN TWICE DAILY 30 UNITS IN THE MORNING AND 25 UNITS BEFORE SUPPER   Lancets (ONETOUCH DELICA PLUS [id number]) MISC Use to check blood sugars four times daily as instructed   losartan (COZAAR) 100 MG tablet TAKE 1 TABLET(100 MG) BY MOUTH DAILY   OZEMPIC, 0.25 OR 0.5 MG/DOSE, 2 MG/3ML SOPN INJECT 0.5 MG into THE SKIN ONCE A WEEK   pioglitazone (ACTOS) 15 MG tablet TAKE ONE TABLET BY MOUTH EVERYDAY AT BEDTIME   pregabalin (LYRICA) 150 MG capsule TAKE ONE CAPSULE BY MOUTH EVERYDAY AT BEDTIME   tiZANidine (ZANAFLEX) 2 MG tablet TAKE 1 TABLET BY MOUTH EVERY DAY IN THE EVENING   No  facility-administered encounter medications on file as of 12/24/2021.   Care Gaps: Tetanus/TDAP Zoster Vaccine Diabetic Eye Exam Influenza Vaccine  Star Rating Drugs: Atorvastatin 20 mg last filled on 12/08/2021 for a 30-Day supply with [facility] Losartan 100 mg last filled on 08/15/2023for a 30-Day supply with [facility] Pioglitazone 15 mg last filled on 12/08/2021 for a 30-Day supply with [facility] Ozempic 0.5 mg last filled on 12/08/2021 for a 30-Day supply with [facility]  BP Readings from Last 3 Encounters:  10/16/21 132/68  05/25/21 (!) 142/70  05/08/21 138/82    Lab Results  Component Value Date   HGBA1C 7.9 (A) 10/16/2021    Patient obtains medications through Adherence Packaging  30 Days   Last adherence delivery included:  Pen Needles 31 g 6 mm use three times daily Docusate Sodium 100 mg 1 capsule every other day (Bedtime) Atorvastatin 20 mg take 1 tablet daily (Bedtime) Pioglitazone 150 mg 1 capsule daily (Bedtime) Pregabalin 150 mg 1 capsule daily (Bedtime) Tizanidine 2 mg 1 tablet daily (Bedtime) Losartan 100 mg 1 tablet daily (Bedtime) Ozempic 2 mg/54m Inject 0.5 mg weekly Lancets OneTouch Delica Plus OneTouch Ultra Test Strips  Patient declined the following medications: No medications were declined  Patient is due for next adherence delivery on: 01/06/2022 (Wednesday) 1st Route. Called patient and reviewed medications and coordinated delivery.  This delivery to include: Pen Needles 31 g 6 mm use three times daily  Docusate Sodium 100 mg 1 capsule every other day (Bedtime) Atorvastatin 20 mg take 1 tablet daily (Bedtime) Pioglitazone 150 mg 1 capsule daily (Bedtime) Pregabalin 150 mg 1 capsule daily (Bedtime) Tizanidine 2 mg 1 tablet daily (Bedtime) Losartan 100 mg 1 tablet daily (Bedtime) Ozempic 2 mg/69m Inject 0.5 mg weekly Lancets OneTouch Delica Plus OneTouch Ultra Test Strips Humalog 75/25 mix Inject twice daily  30 units in the a.m. and 25 units before dinner  Patient declined the following medications (meds) due to (reason)  Patient needs refills for Tizanidine 2 mg and Humalog 75/25 both are PCP medications so CPP can send refills for both.  Confirmed delivery date of 01/06/2022 1st Route, advised patient that pharmacy will contact them the morning of delivery.  Patient has a telephone follow-up with AJunius Argyle CPP on 01/06/2022 @ 1545.  TLynann Bologna CPA/CMA Clinical Pharmacist Assistant Phone: [phone]

## 2022-01-01 ENCOUNTER — Other Ambulatory Visit: Payer: Self-pay | Admitting: Family Medicine

## 2022-01-01 DIAGNOSIS — E1165 Type 2 diabetes mellitus with hyperglycemia: Secondary | ICD-10-CM

## 2022-01-01 DIAGNOSIS — E1169 Type 2 diabetes mellitus with other specified complication: Secondary | ICD-10-CM

## 2022-01-01 DIAGNOSIS — I700 Atherosclerosis of aorta: Secondary | ICD-10-CM

## 2022-01-06 ENCOUNTER — Telehealth: Payer: Medicare Other

## 2022-01-06 NOTE — Progress Notes (Deleted)
Chronic Care Management Pharmacy Note  01/06/2022 Name:  William Parks MRN:  [id number] DOB:  [birth year]  Summary: Patient presents for CCM follow-up. Today's visit was 50% conducted with the patient and 50% conducted  with [name], patient's daughter who helps manage her father's medicines despite living in [place].       Recommendations/Changes made from today's visit: -Recommended to continue current medication.   Plan: CPP follow-up 1 month.  Subjective: William Parks is an 76 y.o. year old male who is a primary patient of Steele Sizer, MD.  The CCM team was consulted for assistance with disease management and care coordination needs.    Engaged with patient by telephone for follow up visit in response to provider referral for pharmacy case management and/or care coordination services.   Consent to Services:  The patient was given information about Chronic Care Management services, agreed to services, and gave verbal consent prior to initiation of services.  Please see initial visit note for detailed documentation.   Patient Care Team: Steele Sizer, MD as PCP - General (Family Medicine) Germaine Pomfret, [facility] (Pharmacist)  Recent office visits: 10/16/21: Patient presented to Dr. Ancil Boozer for follow-up. A1c 7.9%.   Recent consult visits: 12/04/21: Patient presented to Kerry Dory (Neurology) for Sleep study.   Hospital visits: None in previous 6 months   Objective:  Lab Results  Component Value Date   CREATININE 0.98 05/08/2021   BUN 11 05/08/2021   EGFR 80 05/08/2021   GFRNONAA >60 09/09/2020   GFRAA 71 12/03/2019   NA 141 05/08/2021   K 3.8 05/08/2021   CALCIUM 9.7 05/08/2021   CO2 33 (H) 05/08/2021   GLUCOSE 240 (H) 05/08/2021    Lab Results  Component Value Date/Time   HGBA1C 7.9 (A) 10/16/2021 11:10 AM   HGBA1C 11.0 (A) 05/25/2021 11:05 AM   HGBA1C 10.5 (H) 08/27/2020 01:01 AM   HGBA1C 10.3 06/30/2020 12:00 AM   HGBA1C 7.0 (H) 09/05/2018  10:44 AM   HGBA1C 6.8 02/03/2018 10:21 AM   HGBA1C 6.8 (A) 02/03/2018 10:21 AM   HGBA1C 6.8 02/03/2018 10:21 AM   FRUCTOSAMINE 345 (H) 12/03/2019 03:28 PM   MICROALBUR 20 07/16/2020 12:06 PM   MICROALBUR 2.3 04/06/2019 12:00 AM   MICROALBUR 1.0 09/05/2018 10:44 AM    Last diabetic Eye exam: No results found for: "HMDIABEYEEXA"  Last diabetic Foot exam:  Lab Results  Component Value Date/Time   HMDIABFOOTEX Normal 06/30/2020 12:00 AM     Lab Results  Component Value Date   CHOL 135 08/27/2020   HDL 49 08/27/2020   LDLCALC 74 08/27/2020   TRIG 100 09/08/2020   CHOLHDL 2.8 08/27/2020       Latest Ref Rng & Units 05/08/2021    2:06 PM 09/08/2020    5:06 AM 09/04/2020    5:35 AM  Hepatic Function  Total Protein 6.1 - 8.1 g/dL 6.5  6.0  6.3   Albumin 3.5 - 5.0 g/dL  2.0  2.0   AST 10 - 35 U/L _0 ALT 9 - 46 U/L _1 Alk Phosphatase 38 - 126 U/L  43  54   Total Bilirubin 0.2 - 1.2 mg/dL 0.7  0.3  0.4     Lab Results  Component Value Date/Time   TSH 1.51 05/08/2021 02:06 PM   TSH 2.356 08/26/2020 03:29 PM   TSH 1.76 06/01/2016 10:37 AM       Latest Ref Rng &  Units 05/08/2021    2:06 PM 09/08/2020    5:06 AM 09/05/2020    4:49 AM  CBC  WBC 3.8 - 10.8 Thousand/uL 4.1  9.3  8.6   Hemoglobin 13.2 - 17.1 g/dL 13.1  10.1  9.8   Hematocrit 38.5 - 50.0 % 40.2  29.9  29.7   Platelets 140 - 400 Thousand/uL 284  294  325     Lab Results  Component Value Date/Time   VD25OH 27 (L) 06/01/2016 10:37 AM    Clinical ASCVD: No  The 10-year ASCVD risk score (Arnett DK, et al., 2019) is: 33.9%   Values used to calculate the score:     Age: 76 years     Sex: Male     Is Non-Hispanic African American: Yes     Diabetic: Yes     Tobacco smoker: No     Systolic Blood Pressure: 161 mmHg     Is BP treated: Yes     HDL Cholesterol: 49 mg/dL     Total Cholesterol: 135 mg/dL       10/16/2021   11:06 AM 08/27/2021   11:55 AM 05/25/2021   10:53 AM  Depression screen  PHQ 2/9  Decreased Interest 0 0 0  Down, Depressed, Hopeless 0 2 0  PHQ - 2 Score 0 2 0  Altered sleeping  3 0  Tired, decreased energy  1 0  Change in appetite  1 0  Feeling bad or failure about yourself   0 0  Trouble concentrating  1 0  Moving slowly or fidgety/restless  1 0  Suicidal thoughts  0 0  PHQ-9 Score  9 0  Difficult doing work/chores  Not difficult at all     Social History   Tobacco Use  Smoking Status Former   Packs/day: 0.25   Years: 50.00   Total pack years: 12.50   Types: Cigarettes   Start date: 07/27/1967   Quit date: 08/27/2020   Years since quitting: 1.3  Smokeless Tobacco Never   BP Readings from Last 3 Encounters:  10/16/21 132/68  05/25/21 (!) 142/70  05/08/21 138/82   Pulse Readings from Last 3 Encounters:  10/16/21 86  05/25/21 67  05/08/21 82   Wt Readings from Last 3 Encounters:  10/16/21 183 lb (83 kg)  05/25/21 174 lb (78.9 kg)  05/08/21 172 lb (78 kg)   BMI Readings from Last 3 Encounters:  10/16/21 27.83 kg/m  05/25/21 27.25 kg/m  05/08/21 26.94 kg/m    Assessment/Interventions: Review of patient past medical history, allergies, medications, health status, including review of consultants reports, laboratory and other test data, was performed as part of comprehensive evaluation and provision of chronic care management services.   SDOH:  (Social Determinants of Health) assessments and interventions performed: Yes SDOH Interventions    Flowsheet Row Clinical Support from 08/27/2021 in [place] Management from 07/22/2021 in [facility] Office Visit from 11/14/2020 in [facility]  SDOH Interventions     Food Insecurity Interventions Intervention Not Indicated -- --  Housing Interventions Intervention Not Indicated -- --  Transportation Interventions Intervention Not Indicated Intervention Not Indicated --  Depression Interventions/Treatment  Patient refuses  Treatment -- Patient refuses Treatment  Financial Strain Interventions Intervention Not Indicated Intervention Not Indicated --  Physical Activity Interventions Intervention Not Indicated -- --  Stress Interventions Intervention Not Indicated -- --  Social Connections Interventions Intervention Not Indicated -- --  SDOH Screenings   Food Insecurity: No Food Insecurity (08/27/2021)  Housing: Low Risk  (08/27/2021)  Transportation Needs: No Transportation Needs (08/27/2021)  Alcohol Screen: Low Risk  (08/27/2021)  Depression (PHQ2-9): Low Risk  (10/16/2021)  Recent Concern: Depression (PHQ2-9) - Medium Risk (08/27/2021)  Financial Resource Strain: Low Risk  (08/27/2021)  Physical Activity: Sufficiently Active (08/27/2021)  Social Connections: Moderately Isolated (08/27/2021)  Stress: No Stress Concern Present (08/27/2021)  Tobacco Use: Medium Risk (10/16/2021)    CCM Care Plan  No Known Allergies  Medications Reviewed Today     Reviewed by Steele Sizer, MD (Physician) on 10/16/21 at 1121  Med List Status: <None>   Medication Order Taking? Sig Documenting Provider Last Dose Status Informant  aspirin 81 MG EC tablet [id number] Yes Take 81 mg by mouth daily. [provider] Taking Active Self           Med Note Gloris Ham, [place] [employer] [facility]   Thu May 24, 2019 11:44 AM)    atorvastatin (LIPITOR) 20 MG tablet [id number] Yes Take 1 tablet (20 mg total) by mouth daily at 6 PM. New dose Steele Sizer, MD Taking Active   azelastine (ASTELIN) 0.1 % nasal spray [id number] Yes Place 2 sprays into both nostrils 2 (two) times daily. Use in each nostril as directed Steele Sizer, MD Taking Active   blood glucose meter kit and supplies [id number] Yes 1 each by Other route 2 (two) times daily as needed for other. Dispense based on patient and insurance preference. Use up to four times daily as directed. (FOR ICD-10 E10.9, E11.9). Uncontrolled DM Sowles, Drue Stager, MD Taking Active   COMFORT EZ PEN NEEDLES 31G X 6  MM MISC [id number] Yes USE one THREE TIMES DAILY AS NEEDED Ancil Boozer, Drue Stager, MD Taking Active   docusate sodium (COLACE) 100 MG capsule [id number] Yes Take 1 capsule (100 mg total) by mouth every other day. Steele Sizer, MD Taking Active   glucose blood (ONETOUCH ULTRA) test strip [id number] Yes Use to check blood sugars four times daily as instructed Steele Sizer, MD Taking Active   Insulin Lispro Prot & Lispro (HUMALOG 75/25 MIX) (75-25) 100 UNIT/ML Claiborne Rigg [id number] Yes INJECT into THE SKIN TWICE DAILY with meals. 30 UNITS IN THE MORNING AND 25 UNITS BEFORE SUPPER Teodora Medici, DO Taking Active   Lancets (ONETOUCH DELICA PLUS [id number]) Gary [id number] Yes Use to check blood sugars four times daily as instructed Steele Sizer, MD Taking Active   losartan (COZAAR) 100 MG tablet [id number] Yes TAKE 1 TABLET(100 MG) BY MOUTH DAILY Sowles, Drue Stager, MD Taking Active   OZEMPIC, 0.25 OR 0.5 MG/DOSE, 2 MG/3ML SOPN [id number] Yes INJECT 0.5 MG into THE SKIN ONCE A WEEK Sowles, Drue Stager, MD Taking Active   pioglitazone (ACTOS) 15 MG tablet [id number] Yes Take 1 tablet (15 mg total) by mouth daily. Steele Sizer, MD Taking Active   pregabalin (LYRICA) 150 MG capsule [id number] Yes Take 1 capsule (150 mg total) by mouth at bedtime. Steele Sizer, MD Taking Active   tiZANidine (ZANAFLEX) 2 MG tablet [id number] Yes TAKE 1 TABLET BY MOUTH EVERY DAY IN THE Lorelei Pont, Drue Stager, MD Taking Active             Patient Active Problem List   Diagnosis Date Noted   Type 2 diabetes mellitus with peripheral neuropathy ([place]) 10/16/2021   S/P right colectomy 11/14/2020   Atherosclerosis of aorta ([place]) 10/03/2019   Neck and shoulder pain 11/08/2016   Vitamin D deficiency 10/15/2015   Dyslipidemia 06/02/2015   Hypertension,  benign 04/01/2015    Immunization History  Administered Date(s) Administered   Fluad Quad(high Dose 65+) 02/06/2019, 03/05/2020, 02/18/2021   Influenza, High Dose Seasonal PF  04/01/2015, 01/25/2017, 02/03/2018   Influenza,inj,Quad PF,6+ Mos 01/07/2016   PFIZER(Purple Top)SARS-COV-2 Vaccination 01/09/2020, 01/30/2020   Pneumococcal Conjugate-13 04/21/2016   Pneumococcal Polysaccharide-23 04/27/2017   Zoster Recombinat (Shingrix) 05/12/2018    Conditions to be addressed/monitored:  Hypertension, Hyperlipidemia, and Diabetes  There are no care plans that you recently modified to display for this patient.     Medication Assistance: None required.  Patient affirms current coverage meets needs.  Compliance/Adherence/Medication fill history: Care Gaps: Zoster Vaccine COVID-19 Vaccine Booster 3 Diabetic Eye Exam Diabetic Foot Exam BP > 140/90 A1C > 9  Star-Rating Drugs: Atorvastatin 20 mg last filled on 07/08/2021 for a 30-Day supply with [facility] Losartan 100 mg last filled on 07/08/2021 for a 30-Day supply with [facility] Pioglitazone 15 mg last filled on 07/08/2021 for a 30-Day supply with [facility]  Patient's preferred pharmacy is:  [place], [place] - [address] [place] [address] Phone: [phone] Fax: [facility] #[id number] Lorina Rabon, [place] - [address] [place] [address] Phone: [phone] Fax: [phone]  Patient decided to: Utilize [facility] for medication synchronization, packaging and delivery  Care Plan and Follow Up Patient Decision:  Patient agrees to Care Plan and Follow-up.  Plan: Telephone follow up appointment with care management team member scheduled for:  01/06/2022 at 3:45 PM  Malva Limes, [place] Pharmacist Practitioner  [facility] [phone]  Patient Care Plan: General Pharmacy (Adult)     Problem Identified: Hypertension, Hyperlipidemia, and Diabetes   Priority: High     Long-Range  Goal: Patient-Specific Goal   Start Date: 07/22/2021  Expected End Date: 07/23/2022  This Visit's Progress: On track  Recent Progress: On track  Priority: High  Note:      Current Barriers:  Unable to achieve control of diabetes   Pharmacist Clinical Goal(s):  Patient will achieve control of diabetes as evidenced by A1c less than 8% through collaboration with PharmD and provider.   Interventions: 1:1 collaboration with Steele Sizer, MD regarding development and update of comprehensive plan of care as evidenced by provider attestation and co-signature Inter-disciplinary care team collaboration (see longitudinal plan of care) Comprehensive medication review performed; medication list updated in electronic medical record  Hypertension (BP goal <130/80) -Uncontrolled -Current treatment: Losartan 100 mg daily: Appropriate, Query effective -Medications previously tried: NA  -Current home readings:  -Denies hypotensive/hypertensive symptoms -Recommended to continue current medication  Hyperlipidemia: (LDL goal < 70) -Uncontrolled -History of atherosclerosis  -Current treatment: Atorvastatin 20 mg daily: Appropriate, Query effective -Current treatment: Aspirin 81 mg daily: Appropriate, Effective, Safe, Accessible  -Medications previously tried: NA  -10-year ASVCD risk very high  -Recommend increasing atorvastatin to 40 mg daily   Diabetes (A1c goal <8%) -Uncontrolled -Current medications: Humalog 75/25 30 units AM, 25 units PM: Appropriate, Query effective Administers with no regard to meal timing.  Pioglitazone 15 mg daily: Appropriate, Query effective Ozempic 0.5 mg weekly: Appropriate, Query effective -Current medications: Pregabalin 150 mg nightly : Appropriate, Query effective -Medications previously tried: NA  -Current home glucose readings fasting glucose: 211, 187, 250 -Denies hypoglycemic/hyperglycemic symptoms -Low appetite, finds himself struggling to eat.  Despite this patient reports eating multiple concentrated carbohydrates  throughout the day including Fried foods, corn, and beans. Picky about many non-starchy vegetables.  -Extensive dietary counseling was given today. Patient was counseled to administer insulin 15 minutes prior to breakfast and supper.   -Recommended to continue current medication. Asked patient to keep record of glucose and will defer medicine changes in lieu of dietary changes and adjustment of insulin administration.   Patient Goals/Self-Care Activities Patient will:  - check glucose twice daily before breakfast and before supper, document, and provide at future appointments check blood pressure 2-3 times weekly, document, and provide at future appointments  Follow Up Plan: ***

## 2022-01-25 ENCOUNTER — Other Ambulatory Visit: Payer: Self-pay

## 2022-01-25 ENCOUNTER — Telehealth: Payer: Self-pay

## 2022-01-25 DIAGNOSIS — M545 Low back pain, unspecified: Secondary | ICD-10-CM

## 2022-01-25 DIAGNOSIS — E1165 Type 2 diabetes mellitus with hyperglycemia: Secondary | ICD-10-CM

## 2022-01-25 DIAGNOSIS — E1169 Type 2 diabetes mellitus with other specified complication: Secondary | ICD-10-CM

## 2022-01-25 DIAGNOSIS — I700 Atherosclerosis of aorta: Secondary | ICD-10-CM

## 2022-01-25 MED ORDER — OZEMPIC (0.25 OR 0.5 MG/DOSE) 2 MG/3ML ~~LOC~~ SOPN: 0.5000 mg | PEN_INJECTOR | SUBCUTANEOUS | 2 refills | Status: DC

## 2022-01-25 MED ORDER — INSULIN LISPRO PROT & LISPRO (75-25 MIX) 100 UNIT/ML KWIKPEN: PEN_INJECTOR | SUBCUTANEOUS | 2 refills | Status: DC

## 2022-01-25 MED ORDER — COMFORT EZ PEN NEEDLES 31G X 6 MM MISC
1 refills | Status: DC
Start: 2022-01-25 — End: 2022-03-31

## 2022-01-25 MED ORDER — TIZANIDINE HCL 2 MG PO TABS: ORAL_TABLET | ORAL | 0 refills | Status: DC

## 2022-01-25 NOTE — Addendum Note (Signed)
Addended by: Daron Offer A on: 01/25/2022 10:30 AM   Modules accepted: Orders

## 2022-01-25 NOTE — Progress Notes (Signed)
Chronic Care Management Pharmacy Assistant   Name: William Parks  MRN: [id number] DOB: 1945/08/31  Reason for Encounter: Medication Review/Medication Coordination for [facility]   Recent office visits:  None ID  Recent consult visits:  None ID  Hospital visits:  None in previous 6 months  Medications: Outpatient Encounter Medications as of 01/25/2022  Medication Sig   aspirin 81 MG EC tablet Take 81 mg by mouth daily.   atorvastatin (LIPITOR) 20 MG tablet TAKE ONE TABLET BY MOUTH EVERYDAY AT BEDTIME   azelastine (ASTELIN) 0.1 % nasal spray Place 2 sprays into both nostrils 2 (two) times daily. Use in each nostril as directed   blood glucose meter kit and supplies 1 each by Other route 2 (two) times daily as needed for other. Dispense based on patient and insurance preference. Use up to four times daily as directed. (FOR ICD-10 E10.9, E11.9). Uncontrolled DM   COMFORT EZ PEN NEEDLES 31G X 6 MM MISC USE one THREE TIMES DAILY AS NEEDED   docusate sodium (COLACE) 100 MG capsule TAKE ONE CAPSULE BY MOUTH Every Other Day at Bedtime   glucose blood (ONETOUCH ULTRA) test strip Use to check blood sugars four times daily as instructed   Insulin Lispro Prot & Lispro (HUMALOG 75/25 MIX) (75-25) 100 UNIT/ML Kwikpen INJECT 30 UNITS into THE SKIN IN THE MORNING AND 25 UNITS BEFORE SUPPER   Lancets (ONETOUCH DELICA PLUS [id number]) MISC Use to check blood sugars four times daily as instructed   losartan (COZAAR) 100 MG tablet TAKE 1 TABLET(100 MG) BY MOUTH DAILY   OZEMPIC, 0.25 OR 0.5 MG/DOSE, 2 MG/3ML SOPN INJECT 0.5 MG into THE SKIN ONCE A WEEK   pioglitazone (ACTOS) 15 MG tablet TAKE ONE TABLET BY MOUTH EVERYDAY AT BEDTIME   pregabalin (LYRICA) 150 MG capsule TAKE ONE CAPSULE BY MOUTH EVERYDAY AT BEDTIME   tiZANidine (ZANAFLEX) 2 MG tablet TAKE 1 TABLET BY MOUTH EVERY DAY IN THE EVENING   No facility-administered encounter medications on file as of 01/25/2022.   Care  Gaps: Tetanus/TDAP Zoster Vaccines Diabetic Kidney Evaluation- Last completed 04/06/2019 Diabetic Eye Exam Influenza Vaccine  Star Rating Drugs: Atorvastatin 20 mg last filled on 01/04/2022 for a 30-Day supply with [facility] Losartan 100 mg last filled on 01/04/2022 for a 30-Day supply with [facility] Pioglitazone 15 mg last filled on 01/04/2022 for a 30-Day supply with [facility] Ozempic 0.5 mglast filled on 01/04/2022 for a 30-Day supply with [facility]  BP Readings from Last 3 Encounters:  10/16/21 132/68  05/25/21 (!) 142/70  05/08/21 138/82    Lab Results  Component Value Date   HGBA1C 7.9 (A) 10/16/2021    Patient obtains medications through Adherence Packaging  30 Days   Last adherence delivery included:  Pen Needles 31 g 6 mm use three times daily Docusate Sodium 100 mg 1 capsule every other day (Bedtime) Atorvastatin 20 mg take 1 tablet daily (Bedtime) Pioglitazone 150 mg 1 capsule daily (Bedtime) Pregabalin 150 mg 1 capsule daily (Bedtime) Tizanidine 2 mg 1 tablet daily (Bedtime) Losartan 100 mg 1 tablet daily (Bedtime) Ozempic 2 mg/90m Inject 0.5 mg weekly Lancets OneTouch Delica Plus OneTouch Ultra Test Strips Humalog 75/25 mix Inject twice daily 30 units in the a.m. and 25 units before dinner  Patient declined medications for the month of September: No medication were declined  Patient is due for next adherence delivery on: 02/04/2022 (Thursday) 1st Route. Called patient and reviewed medications and coordinated delivery.  This delivery  to include: Pen Needles 31 g 6 mm use three times daily Docusate Sodium 100 mg 1 capsule every other day (Bedtime) Atorvastatin 20 mg take 1 tablet daily (Bedtime) Pioglitazone 150 mg 1 capsule daily (Bedtime) Pregabalin 150 mg 1 capsule daily (Bedtime) Tizanidine 2 mg 1 tablet daily (Bedtime) Losartan 100 mg 1 tablet daily (Bedtime) Ozempic 2 mg/84m Inject 0.5 mg weekly Lancets OneTouch  Delica Plus OneTouch Ultra Test Strips Humalog 75/25 mix Inject twice daily 30 units in the a.m. and 25 units before dinner  Patient declined the following medications for the month of October: No medications were declined for this month's delivery  Patient needs refills for: Pen Needles, Tizanidine 2 mg, Ozempic 0.5 mg, Insulin Lispro Mix. All are PCP medications so CPP will be able to refill these medications.  Confirmed delivery date of 02/04/2022 1st Route, advised patient that pharmacy will contact them the morning of delivery.  Patient has a telephone appointment with AJunius Argyle CPP on 02/03/2022 @ 1500.  TLynann Bologna CPA/CMA Clinical Pharmacist Assistant Phone: [phone]

## 2022-02-03 ENCOUNTER — Ambulatory Visit (INDEPENDENT_AMBULATORY_CARE_PROVIDER_SITE_OTHER): Payer: Medicare Other

## 2022-02-03 DIAGNOSIS — E1142 Type 2 diabetes mellitus with diabetic polyneuropathy: Secondary | ICD-10-CM

## 2022-02-03 DIAGNOSIS — E1165 Type 2 diabetes mellitus with hyperglycemia: Secondary | ICD-10-CM

## 2022-02-03 DIAGNOSIS — I700 Atherosclerosis of aorta: Secondary | ICD-10-CM

## 2022-02-03 DIAGNOSIS — E1169 Type 2 diabetes mellitus with other specified complication: Secondary | ICD-10-CM

## 2022-02-03 MED ORDER — INSULIN LISPRO PROT & LISPRO (75-25 MIX) 100 UNIT/ML KWIKPEN: PEN_INJECTOR | SUBCUTANEOUS | 2 refills | Status: DC

## 2022-02-03 NOTE — Assessment & Plan Note (Signed)
Increase Humalog 75/25 to 30 units before breakfast and 34 units before supper.

## 2022-02-03 NOTE — Progress Notes (Signed)
Chronic Care Management Pharmacy Note  02/11/2022 Name:  William Parks MRN:  [id number] DOB:  17-Jun-1945  Summary: Patient presents for CCM follow-up. Today's visit was 50% conducted with the patient and 50% conducted  with [name], patient's daughter who helps manage her father's medicines despite living in [place].    Recommendations/Changes made from today's visit: -Increase Humalog 75/25 to 30 units before breakfast, 34 units before supper   Plan: CPP follow-up 3 months.  Subjective: William Parks is an 76 y.o. year old male who is a primary patient of Steele Sizer, MD.  The CCM team was consulted for assistance with disease management and care coordination needs.    Engaged with patient by telephone for follow up visit in response to provider referral for pharmacy case management and/or care coordination services.   Consent to Services:  The patient was given information about Chronic Care Management services, agreed to services, and gave verbal consent prior to initiation of services.  Please see initial visit note for detailed documentation.   Patient Care Team: Steele Sizer, MD as PCP - General (Family Medicine) Germaine Pomfret, [facility] (Pharmacist)  Recent office visits: 10/16/21: Patient presented to Dr. Ancil Boozer for follow-up. A1c 7.9%.   Recent consult visits: 12/04/21: Patient presented to [facility] (Neurology) for Sleep study.   Hospital visits: None in previous 6 months   Objective:  Lab Results  Component Value Date   CREATININE 0.98 05/08/2021   BUN 11 05/08/2021   EGFR 80 05/08/2021   GFRNONAA >60 09/09/2020   GFRAA 71 12/03/2019   NA 141 05/08/2021   K 3.8 05/08/2021   CALCIUM 9.7 05/08/2021   CO2 33 (H) 05/08/2021   GLUCOSE 240 (H) 05/08/2021    Lab Results  Component Value Date/Time   HGBA1C 7.9 (A) 10/16/2021 11:10 AM   HGBA1C 11.0 (A) 05/25/2021 11:05 AM   HGBA1C 10.5 (H) 08/27/2020 01:01 AM   HGBA1C 10.3 06/30/2020 12:00 AM    HGBA1C 7.0 (H) 09/05/2018 10:44 AM   HGBA1C 6.8 02/03/2018 10:21 AM   HGBA1C 6.8 (A) 02/03/2018 10:21 AM   HGBA1C 6.8 02/03/2018 10:21 AM   FRUCTOSAMINE 345 (H) 12/03/2019 03:28 PM   MICROALBUR 20 07/16/2020 12:06 PM   MICROALBUR 2.3 04/06/2019 12:00 AM   MICROALBUR 1.0 09/05/2018 10:44 AM    Last diabetic Eye exam: No results found for: "HMDIABEYEEXA"  Last diabetic Foot exam:  Lab Results  Component Value Date/Time   HMDIABFOOTEX Normal 06/30/2020 12:00 AM     Lab Results  Component Value Date   CHOL 135 08/27/2020   HDL 49 08/27/2020   LDLCALC 74 08/27/2020   TRIG 100 09/08/2020   CHOLHDL 2.8 08/27/2020       Latest Ref Rng & Units 05/08/2021    2:06 PM 09/08/2020    5:06 AM 09/04/2020    5:35 AM  Hepatic Function  Total Protein 6.1 - 8.1 g/dL 6.5  6.0  6.3   Albumin 3.5 - 5.0 g/dL  2.0  2.0   AST 10 - 35 U/L $Remo'14  30  21   'rofAT$ ALT 9 - 46 U/L $Remo'15  14  19   'NRPqo$ Alk Phosphatase 38 - 126 U/L  43  54   Total Bilirubin 0.2 - 1.2 mg/dL 0.7  0.3  0.4     Lab Results  Component Value Date/Time   TSH 1.51 05/08/2021 02:06 PM   TSH 2.356 08/26/2020 03:29 PM   TSH 1.76 06/01/2016 10:37 AM  Latest Ref Rng & Units 05/08/2021    2:06 PM 09/08/2020    5:06 AM 09/05/2020    4:49 AM  CBC  WBC 3.8 - 10.8 Thousand/uL 4.1  9.3  8.6   Hemoglobin 13.2 - 17.1 g/dL 13.1  10.1  9.8   Hematocrit 38.5 - 50.0 % 40.2  29.9  29.7   Platelets 140 - 400 Thousand/uL 284  294  325     Lab Results  Component Value Date/Time   VD25OH 27 (L) 06/01/2016 10:37 AM    Clinical ASCVD: No  The 10-year ASCVD risk score (Arnett DK, et al., 2019) is: 33.9%   Values used to calculate the score:     Age: 76 years     Sex: Male     Is Non-Hispanic African American: Yes     Diabetic: Yes     Tobacco smoker: No     Systolic Blood Pressure: 353 mmHg     Is BP treated: Yes     HDL Cholesterol: 49 mg/dL     Total Cholesterol: 135 mg/dL       10/16/2021   11:06 AM 08/27/2021   11:55 AM 05/25/2021    10:53 AM  Depression screen PHQ 2/9  Decreased Interest 0 0 0  Down, Depressed, Hopeless 0 2 0  PHQ - 2 Score 0 2 0  Altered sleeping  3 0  Tired, decreased energy  1 0  Change in appetite  1 0  Feeling bad or failure about yourself   0 0  Trouble concentrating  1 0  Moving slowly or fidgety/restless  1 0  Suicidal thoughts  0 0  PHQ-9 Score  9 0  Difficult doing work/chores  Not difficult at all     Social History   Tobacco Use  Smoking Status Former   Packs/day: 0.25   Years: 50.00   Total pack years: 12.50   Types: Cigarettes   Start date: 07/27/1967   Quit date: 08/27/2020   Years since quitting: 1.4  Smokeless Tobacco Never   BP Readings from Last 3 Encounters:  10/16/21 132/68  05/25/21 (!) 142/70  05/08/21 138/82   Pulse Readings from Last 3 Encounters:  10/16/21 86  05/25/21 67  05/08/21 82   Wt Readings from Last 3 Encounters:  10/16/21 183 lb (83 kg)  05/25/21 174 lb (78.9 kg)  05/08/21 172 lb (78 kg)   BMI Readings from Last 3 Encounters:  10/16/21 27.83 kg/m  05/25/21 27.25 kg/m  05/08/21 26.94 kg/m    Assessment/Interventions: Review of patient past medical history, allergies, medications, health status, including review of consultants reports, laboratory and other test data, was performed as part of comprehensive evaluation and provision of chronic care management services.   SDOH:  (Social Determinants of Health) assessments and interventions performed: Yes SDOH Interventions    Flowsheet Row Clinical Support from 08/27/2021 in [place] Management from 07/22/2021 in [facility] Office Visit from 11/14/2020 in [facility]  SDOH Interventions     Food Insecurity Interventions Intervention Not Indicated -- --  Housing Interventions Intervention Not Indicated -- --  Transportation Interventions Intervention Not Indicated Intervention Not Indicated --  Depression  Interventions/Treatment  Patient refuses Treatment -- Patient refuses Treatment  Financial Strain Interventions Intervention Not Indicated Intervention Not Indicated --  Physical Activity Interventions Intervention Not Indicated -- --  Stress Interventions Intervention Not Indicated -- --  Social Connections Interventions Intervention Not Indicated -- --  SDOH Screenings   Food Insecurity: No Food Insecurity (08/27/2021)  Housing: Low Risk  (08/27/2021)  Transportation Needs: No Transportation Needs (08/27/2021)  Alcohol Screen: Low Risk  (08/27/2021)  Depression (PHQ2-9): Low Risk  (10/16/2021)  Recent Concern: Depression (PHQ2-9) - Medium Risk (08/27/2021)  Financial Resource Strain: Low Risk  (08/27/2021)  Physical Activity: Sufficiently Active (08/27/2021)  Social Connections: Moderately Isolated (08/27/2021)  Stress: No Stress Concern Present (08/27/2021)  Tobacco Use: Medium Risk (10/16/2021)    CCM Care Plan  No Known Allergies  Medications Reviewed Today     Reviewed by Steele Sizer, MD (Physician) on 10/16/21 at 1121  Med List Status: <None>   Medication Order Taking? Sig Documenting Provider Last Dose Status Informant  aspirin 81 MG EC tablet [id number] Yes Take 81 mg by mouth daily. [provider] Taking Active Self           Med Note Gloris Ham, [place] [employer] D   Thu May 24, 2019 11:44 AM)    atorvastatin (LIPITOR) 20 MG tablet [id number] Yes Take 1 tablet (20 mg total) by mouth daily at 6 PM. New dose Steele Sizer, MD Taking Active   azelastine (ASTELIN) 0.1 % nasal spray [id number] Yes Place 2 sprays into both nostrils 2 (two) times daily. Use in each nostril as directed Steele Sizer, MD Taking Active   blood glucose meter kit and supplies [id number] Yes 1 each by Other route 2 (two) times daily as needed for other. Dispense based on patient and insurance preference. Use up to four times daily as directed. (FOR ICD-10 E10.9, E11.9). Uncontrolled DM Sowles, Drue Stager, MD Taking  Active   COMFORT EZ PEN NEEDLES 31G X 6 MM MISC [id number] Yes USE one THREE TIMES DAILY AS NEEDED Ancil Boozer, Drue Stager, MD Taking Active   docusate sodium (COLACE) 100 MG capsule [id number] Yes Take 1 capsule (100 mg total) by mouth every other day. Steele Sizer, MD Taking Active   glucose blood (ONETOUCH ULTRA) test strip [id number] Yes Use to check blood sugars four times daily as instructed Steele Sizer, MD Taking Active   Insulin Lispro Prot & Lispro (HUMALOG 75/25 MIX) (75-25) 100 UNIT/ML Claiborne Rigg [id number] Yes INJECT into THE SKIN TWICE DAILY with meals. 30 UNITS IN THE MORNING AND 25 UNITS BEFORE SUPPER Teodora Medici, DO Taking Active   Lancets (ONETOUCH DELICA PLUS [id number]) [place] [id number] Yes Use to check blood sugars four times daily as instructed Steele Sizer, MD Taking Active   losartan (COZAAR) 100 MG tablet [id number] Yes TAKE 1 TABLET(100 MG) BY MOUTH DAILY Sowles, Drue Stager, MD Taking Active   OZEMPIC, 0.25 OR 0.5 MG/DOSE, 2 MG/3ML SOPN [id number] Yes INJECT 0.5 MG into THE SKIN ONCE A WEEK Sowles, Drue Stager, MD Taking Active   pioglitazone (ACTOS) 15 MG tablet [id number] Yes Take 1 tablet (15 mg total) by mouth daily. Steele Sizer, MD Taking Active   pregabalin (LYRICA) 150 MG capsule [id number] Yes Take 1 capsule (150 mg total) by mouth at bedtime. Steele Sizer, MD Taking Active   tiZANidine (ZANAFLEX) 2 MG tablet [id number] Yes TAKE 1 TABLET BY MOUTH EVERY DAY IN THE Lorelei Pont, Drue Stager, MD Taking Active             Patient Active Problem List   Diagnosis Date Noted   Type 2 diabetes mellitus with peripheral neuropathy ([place]) 10/16/2021   S/P right colectomy 11/14/2020   Atherosclerosis of aorta ([place]) 10/03/2019   Neck and shoulder pain 11/08/2016   Vitamin D deficiency 10/15/2015   Dyslipidemia 06/02/2015   Hypertension,  benign 04/01/2015    Immunization History  Administered Date(s) Administered   Fluad Quad(high Dose 65+) 02/06/2019, 03/05/2020, 02/18/2021    Influenza, High Dose Seasonal PF 04/01/2015, 01/25/2017, 02/03/2018   Influenza,inj,Quad PF,6+ Mos 01/07/2016   PFIZER(Purple Top)SARS-COV-2 Vaccination 01/09/2020, 01/30/2020   Pneumococcal Conjugate-13 04/21/2016   Pneumococcal Polysaccharide-23 04/27/2017   Zoster Recombinat (Shingrix) 05/12/2018    Conditions to be addressed/monitored:  Hypertension, Hyperlipidemia, and Diabetes  Care Plan : General Pharmacy (Adult)  Updates made by Germaine Pomfret, RPH since 02/11/2022 12:00 AM     Problem: Hypertension, Hyperlipidemia, and Diabetes   Priority: High     Long-Range Goal: Patient-Specific Goal   Start Date: 07/22/2021  Expected End Date: 07/23/2022  This Visit's Progress: On track  Recent Progress: On track  Priority: High  Note:   Current Barriers:  Unable to achieve control of diabetes   Pharmacist Clinical Goal(s):  Patient will achieve control of diabetes as evidenced by A1c less than 8% through collaboration with PharmD and provider.   Interventions: 1:1 collaboration with Steele Sizer, MD regarding development and update of comprehensive plan of care as evidenced by provider attestation and co-signature Inter-disciplinary care team collaboration (see longitudinal plan of care) Comprehensive medication review performed; medication list updated in electronic medical record  Hypertension (BP goal <130/80) -Uncontrolled -Current treatment: Losartan 100 mg daily: Appropriate, Query effective -Medications previously tried: NA  -Current home readings:  -Denies hypotensive/hypertensive symptoms -Recommended to continue current medication  Hyperlipidemia: (LDL goal < 70) -Uncontrolled -History of atherosclerosis  -Current treatment: Atorvastatin 20 mg daily: Appropriate, Query effective -Current treatment: Aspirin 81 mg daily: Appropriate, Effective, Safe, Accessible  -Medications previously tried: NA  -10-year ASVCD risk very high  -Recommend increasing  atorvastatin to 40 mg daily   Diabetes (A1c goal <8%) -Uncontrolled -Current medications: Humalog 75/25 30 units AM, 25 units PM: Appropriate, Query effective Pioglitazone 15 mg daily: Appropriate, Query effective Ozempic 0.5 mg weekly: Appropriate, Query effective -Current medications: Pregabalin 150 mg nightly : Appropriate, Query effective -Medications previously tried: NA  -Current home glucose readings fasting glucose: 211, 229, 214, 251, ("personal problems"), 230  -Denies hypoglycemic/hyperglycemic symptoms -Snacking on PB crackers, bananas or apples.   Patient Goals/Self-Care Activities Patient will:  - check glucose twice daily before breakfast and before supper, document, and provide at future appointments check blood pressure 2-3 times weekly, document, and provide at future appointments  Follow Up Plan: Telephone follow up appointment with care management team member scheduled for:  05/12/2022 at 3:00 PM       Medication Assistance: None required.  Patient affirms current coverage meets needs.  Compliance/Adherence/Medication fill history: Care Gaps: Zoster Vaccine COVID-19 Vaccine Booster 3 Diabetic Eye Exam Diabetic Foot Exam BP > 140/90 A1C > 9  Star-Rating Drugs: Uses [facility]   Patient's preferred pharmacy is:  [facility] - [place], [place] - [address] [place] [address] Phone: [phone] Fax: [place], [place] [address] Phone: [phone] Fax: [phone]  Patient decided to: Utilize [facility] for medication synchronization, packaging and delivery  Care Plan and Follow Up Patient Decision:  Patient agrees to Care Plan and Follow-up.  Plan: Telephone follow up appointment with care management team member scheduled for:  05/12/2022 at  3:00 PM  Malva Limes, [place] Clinical Pharmacist Practitioner  [facility] [phone] 

## 2022-02-11 NOTE — Patient Instructions (Signed)
Visit Information It was great speaking with you today!  Please let me know if you have any questions about our visit.  Patient Care Plan: General Pharmacy (Adult)     Problem Identified: Hypertension, Hyperlipidemia, and Diabetes   Priority: High     Long-Range Goal: Patient-Specific Goal   Start Date: 07/22/2021  Expected End Date: 07/23/2022  This Visit's Progress: On track  Recent Progress: On track  Priority: High  Note:   Current Barriers:  Unable to achieve control of diabetes   Pharmacist Clinical Goal(s):  Patient will achieve control of diabetes as evidenced by A1c less than 8% through collaboration with PharmD and provider.   Interventions: 1:1 collaboration with Steele Sizer, MD regarding development and update of comprehensive plan of care as evidenced by provider attestation and co-signature Inter-disciplinary care team collaboration (see longitudinal plan of care) Comprehensive medication review performed; medication list updated in electronic medical record  Hypertension (BP goal <130/80) -Uncontrolled -Current treatment: Losartan 100 mg daily: Appropriate, Query effective -Medications previously tried: NA  -Current home readings:  -Denies hypotensive/hypertensive symptoms -Recommended to continue current medication  Hyperlipidemia: (LDL goal < 70) -Uncontrolled -History of atherosclerosis  -Current treatment: Atorvastatin 20 mg daily: Appropriate, Query effective -Current treatment: Aspirin 81 mg daily: Appropriate, Effective, Safe, Accessible  -Medications previously tried: NA  -10-year ASVCD risk very high  -Recommend increasing atorvastatin to 40 mg daily   Diabetes (A1c goal <8%) -Uncontrolled -Current medications: Humalog 75/25 30 units AM, 25 units PM: Appropriate, Query effective Pioglitazone 15 mg daily: Appropriate, Query effective Ozempic 0.5 mg weekly: Appropriate, Query effective -Current medications: Pregabalin 150 mg nightly :  Appropriate, Query effective -Medications previously tried: NA  -Current home glucose readings fasting glucose: 211, 229, 214, 251, ("personal problems"), 230  -Denies hypoglycemic/hyperglycemic symptoms -Snacking on PB crackers, bananas or apples.   Patient Goals/Self-Care Activities Patient will:  - check glucose twice daily before breakfast and before supper, document, and provide at future appointments check blood pressure 2-3 times weekly, document, and provide at future appointments  Follow Up Plan: Telephone follow up appointment with care management team member scheduled for:  05/12/2022 at 3:00 PM    Patient agreed to services and verbal consent obtained.   The patient verbalized understanding of instructions, educational materials, and care plan provided today and DECLINED offer to receive copy of patient instructions, educational materials, and care plan.   Malva Limes, [place] Pharmacist Practitioner  [facility] [phone]

## 2022-02-12 NOTE — Progress Notes (Deleted)
Name: William Parks   MRN: [id number]    DOB: Aug 20, 1945   Date:02/12/2022       Progress Note  Subjective  Chief Complaint  Follow Up  HPI  DMII: he was seen back in April 2021 and A1C was 12.4 %,  11.1 %  to 9.2 % and back in May  2022 10.5 % , up to 11.8 %  down to 11 %  and today is down to 7.9 % . He is now on dose of insulin  75/25 he is supposed to be taking 30 in am and 25 in pm, and also on  Ozempic 0.5 once a week. Since earlier this year his family has been monitoring his office visits and making sure he takes his medications. He is doing great now with A1C down to 7.9 % goal for him is A1C between 7-8 %  . He has associated HTN, dyslipidemia and neuropathy. He is taking Lyrica as prescribed for diabetic neuropathy and seems to be controlling symptoms. He cannot tolerate Metformin but is taking Actos. He is getting rx through [facility]. His son came in with him today - he lives in [place] , daughter was on the phone during our visit   HTN: he is taking losartan 100 daily but states not longer taking Metoprolol. BP is at goal . Denies chest pain or palpitation , he has someone with you all day and has been compliant with medication. BP is at goal    Dyslipidemia/Atherosclerosis of aorta : he is taking Atorvastatin daily and we will recheck labs next visit    Tobacco use: he quit May 4 th, 2022 with the nicotine patch. No cough or wheezing,    Intermittent low back pain: he states doing well lately.    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 He has been eating his meals and states occasionally still has some soreness on peri umbilical area, but no bulging or redness. His glucose is also under control and may be the  cause of weight gain  Family is very involved, checking on him and making sure he takes his medications.He has an aid during the aid  and a grandson that goes at night    Patient Active Problem List   Diagnosis Date Noted   Type 2 diabetes mellitus with peripheral neuropathy ([place]) 10/16/2021   S/P right colectomy 11/14/2020   Atherosclerosis of aorta ([place]) 10/03/2019   Neck and shoulder pain 11/08/2016   Vitamin D deficiency 10/15/2015   Dyslipidemia 06/02/2015   Hypertension, benign 04/01/2015    Past Surgical History:  Procedure Laterality Date   APPENDECTOMY     CYST REMOVAL TRUNK     2x   LAPAROSCOPY N/A 09/01/2020   Procedure: LAPAROSCOPY DIAGNOSTIC;  Surgeon: Olean Ree, MD;  Location: [facility] ORS;  Service: General;  Laterality: N/A;   PARTIAL COLECTOMY N/A 09/01/2020   Procedure: PARTIAL RIGHT COLECTOMY;  Surgeon: Olean Ree, MD;  Location: [facility] ORS;  Service: General;  Laterality: N/A;   SHOULDER SURGERY Right 2009   rotator cuff surgery    Family History  Problem Relation Age of Onset   Cancer Mother  Breast CA   Diabetes Mother    Hypertension Mother    Diabetes Brother     Social History   Tobacco Use   Smoking status: Former    Packs/day: 0.25    Years: 50.00    Total pack years: 12.50    Types: Cigarettes    Start date: 07/27/1967    Quit date: 08/27/2020    Years since quitting: 1.4   Smokeless tobacco: Never  Substance Use Topics   Alcohol use: No    Alcohol/week: 0.0 standard drinks of alcohol     Current Outpatient Medications:    aspirin 81 MG EC tablet, Take 81 mg by mouth daily., Disp: , Rfl: 5   atorvastatin (LIPITOR) 20 MG tablet, TAKE ONE TABLET BY MOUTH EVERYDAY AT BEDTIME, Disp: 90 tablet, Rfl: 1   azelastine (ASTELIN) 0.1 % nasal spray, Place 2 sprays into both nostrils 2 (two) times daily. Use in each nostril as directed, Disp: 30 mL, Rfl: 2   blood glucose meter kit and supplies, 1 each by Other route 2 (two) times daily as  needed for other. Dispense based on patient and insurance preference. Use up to four times daily as directed. (FOR ICD-10 E10.9, E11.9). Uncontrolled DM, Disp: 1 each, Rfl: 0   docusate sodium (COLACE) 100 MG capsule, TAKE ONE CAPSULE BY MOUTH Every Other Day at Bedtime, Disp: 15 capsule, Rfl: 5   glucose blood (ONETOUCH ULTRA) test strip, Use to check blood sugars four times daily as instructed, Disp: 100 strip, Rfl: 11   Insulin Lispro Prot & Lispro (HUMALOG 75/25 MIX) (75-25) 100 UNIT/ML Kwikpen, Inject 30 units into the skin before breakfast and 34 units before supper., Disp: 21 mL, Rfl: 2   Insulin Pen Needle (COMFORT EZ PEN NEEDLES) 31G X 6 MM MISC, USE one THREE TIMES DAILY AS NEEDED, Disp: 100 each, Rfl: 1   Lancets (ONETOUCH DELICA PLUS [id number]) MISC, Use to check blood sugars four times daily as instructed, Disp: 100 each, Rfl: 11   losartan (COZAAR) 100 MG tablet, TAKE 1 TABLET(100 MG) BY MOUTH DAILY, Disp: 90 tablet, Rfl: 1   pioglitazone (ACTOS) 15 MG tablet, TAKE ONE TABLET BY MOUTH EVERYDAY AT BEDTIME, Disp: 90 tablet, Rfl: 1   pregabalin (LYRICA) 150 MG capsule, TAKE ONE CAPSULE BY MOUTH EVERYDAY AT BEDTIME, Disp: 90 capsule, Rfl: 0   Semaglutide,0.25 or 0.5MG/DOS, (OZEMPIC, 0.25 OR 0.5 MG/DOSE,) 2 MG/3ML SOPN, Inject 0.5 mg into the skin once a week., Disp: 3 mL, Rfl: 2   tiZANidine (ZANAFLEX) 2 MG tablet, TAKE 1 TABLET BY MOUTH EVERY DAY IN THE EVENING, Disp: 90 tablet, Rfl: 0  No Known Allergies  I personally reviewed active problem list, medication list, allergies, family history, social history, health maintenance with the patient/caregiver today.   ROS  ***  Objective  There were no vitals filed for this visit.  There is no height or weight on file to calculate BMI.  Physical Exam ***  No results found for this or any previous visit (from the past 2160 hour(s)).  Diabetic Foot Exam: Diabetic Foot Exam - Simple   No data filed    ***  PHQ2/9:     10/16/2021   11:06 AM 08/27/2021   11:55 AM 05/25/2021   10:53 AM 05/08/2021    1:02 PM 05/07/2021   11:24 AM  Depression screen PHQ 2/9  Decreased Interest 0 0 0 0 0  Down, Depressed, Hopeless 0 2 0 0 0  PHQ - 2 Score  0 2 0 0 0  Altered sleeping  3 0 0 0  Tired, decreased energy  1 0 0 0  Change in appetite  1 0 0 0  Feeling bad or failure about yourself   0 0 0 0  Trouble concentrating  1 0 0 0  Moving slowly or fidgety/restless  1 0 0 0  Suicidal thoughts  0 0 0 0  PHQ-9 Score  9 0 0 0  Difficult doing work/chores  Not difficult at all       phq 9 is {gen pos [id number]}   Fall Risk:    10/16/2021   11:06 AM 08/27/2021   11:59 AM 05/25/2021   10:53 AM 05/08/2021    1:02 PM 05/07/2021   11:23 AM  Fall Risk   Falls in the past year? 0 0 1 0 0  Number falls in past yr: 0 0 1 0 0  Injury with Fall? 0 0 0 0 0  Risk for fall due to : No Fall Risks No Fall Risks Impaired balance/gait No Fall Risks No Fall Risks  Follow up _0       Functional Status Survey:      Assessment & Plan  *** There are no diagnoses linked to this encounter.

## 2022-02-15 ENCOUNTER — Ambulatory Visit: Payer: Medicare Other | Admitting: Family Medicine

## 2022-02-21 ENCOUNTER — Other Ambulatory Visit: Payer: Self-pay | Admitting: Family Medicine

## 2022-02-21 DIAGNOSIS — I10 Essential (primary) hypertension: Secondary | ICD-10-CM

## 2022-02-23 DIAGNOSIS — E785 Hyperlipidemia, unspecified: Secondary | ICD-10-CM

## 2022-02-23 DIAGNOSIS — I10 Essential (primary) hypertension: Secondary | ICD-10-CM | POA: Diagnosis not present

## 2022-02-23 DIAGNOSIS — Z794 Long term (current) use of insulin: Secondary | ICD-10-CM | POA: Diagnosis not present

## 2022-02-23 DIAGNOSIS — E1159 Type 2 diabetes mellitus with other circulatory complications: Secondary | ICD-10-CM

## 2022-02-24 ENCOUNTER — Telehealth: Payer: Self-pay

## 2022-02-24 ENCOUNTER — Ambulatory Visit: Payer: Medicare Other | Admitting: Internal Medicine

## 2022-02-24 NOTE — Progress Notes (Signed)
Chronic Care Management Pharmacy Assistant   Name: Jeramy Dimmick  MRN: [id number] DOB: 1946-03-09  Reason for Encounter: Medication Review/Medication Coordination for [facility]   Recent office visits:  None ID  Recent consult visits:  None ID  Hospital visits:  None in previous 6 months  Medications: Outpatient Encounter Medications as of 02/24/2022  Medication Sig   aspirin 81 MG EC tablet Take 81 mg by mouth daily.   atorvastatin (LIPITOR) 20 MG tablet TAKE ONE TABLET BY MOUTH EVERYDAY AT BEDTIME   azelastine (ASTELIN) 0.1 % nasal spray Place 2 sprays into both nostrils 2 (two) times daily. Use in each nostril as directed   blood glucose meter kit and supplies 1 each by Other route 2 (two) times daily as needed for other. Dispense based on patient and insurance preference. Use up to four times daily as directed. (FOR ICD-10 E10.9, E11.9). Uncontrolled DM   docusate sodium (COLACE) 100 MG capsule TAKE ONE CAPSULE BY MOUTH Every Other Day at Bedtime   glucose blood (ONETOUCH ULTRA) test strip Use to check blood sugars four times daily as instructed   Insulin Lispro Prot & Lispro (HUMALOG 75/25 MIX) (75-25) 100 UNIT/ML Kwikpen Inject 30 units into the skin before breakfast and 34 units before supper.   Insulin Pen Needle (COMFORT EZ PEN NEEDLES) 31G X 6 MM MISC USE one THREE TIMES DAILY AS NEEDED   Lancets (ONETOUCH DELICA PLUS [id number]) MISC Use to check blood sugars four times daily as instructed   losartan (COZAAR) 100 MG tablet TAKE ONE TABLET BY MOUTH EVERYDAY AT BEDTIME   pioglitazone (ACTOS) 15 MG tablet TAKE ONE TABLET BY MOUTH EVERYDAY AT BEDTIME   pregabalin (LYRICA) 150 MG capsule TAKE ONE CAPSULE BY MOUTH EVERYDAY AT BEDTIME   Semaglutide,0.25 or 0.5MG/DOS, (OZEMPIC, 0.25 OR 0.5 MG/DOSE,) 2 MG/3ML SOPN Inject 0.5 mg into the skin once a week.   tiZANidine (ZANAFLEX) 2 MG tablet TAKE 1 TABLET BY MOUTH EVERY DAY IN THE EVENING   No facility-administered  encounter medications on file as of 02/24/2022.   Care Gaps: Tetanus/TDAP Zoster Vaccine Diabetic Eye Exam Influenza Vaccine Diabetic Kidney Evaluation Medicare Wellness Exam  Star Rating Drugs: Atorvastatin 20 mg last filled on 02/01/2022 for a 30-Day supply with [facility] Losartan 100 mg last filled on 02/01/2022 for a 30-Day supply with [facility] Pioglitazone 15 mg last filled on 02/01/2022 for a 30-Day supply with [facility] Ozempic 0.5 mg last filled on 02/01/2022 for a 30-Day supply with [facility]  BP Readings from Last 3 Encounters:  10/16/21 132/68  05/25/21 (!) 142/70  05/08/21 138/82    Lab Results  Component Value Date   HGBA1C 7.9 (A) 10/16/2021   Patient obtains medications through Adherence Packaging  30 Days   Last adherence delivery included:  Pen Needles 31 g 6 mm use three times daily Docusate Sodium 100 mg 1 capsule every other day (Bedtime) Atorvastatin 20 mg take 1 tablet daily (Bedtime) Pioglitazone 15 mg 1 capsule daily (Bedtime) Pregabalin 150 mg 1 capsule daily (Bedtime) Tizanidine 2 mg 1 tablet daily (Bedtime) Losartan 100 mg 1 tablet daily (Bedtime) Ozempic 2 mg/73m Inject 0.5 mg weekly Lancets OneTouch Delica Plus OneTouch Ultra Test Strips Humalog 75/25 mix Inject twice daily 30 units in the a.m. and 30 units before dinner  Patient declined the following medications for the month of October: No medications were declined for this month's delivery  Patient is due for next adherence delivery on: 03/08/2022 1st Route.  Called patient and reviewed medications and coordinated delivery.  This delivery to include: Pen Needles 31 g 6 mm use three times daily Docusate Sodium 100 mg 1 capsule every other day (Bedtime) Atorvastatin 20 mg take 1 tablet daily (Bedtime) Pioglitazone 15 mg 1 capsule daily (Bedtime) Pregabalin 150 mg 1 capsule daily (Bedtime) Tizanidine 2 mg 1 tablet daily (Bedtime) Losartan 100 mg 1 tablet  daily (Bedtime) Ozempic 2 mg/11m Inject 0.5 mg weekly Lancets OneTouch Delica Plus OneTouch Ultra Test Strips Humalog 75/25 mix Inject twice daily 30 units in the a.m. and 34 units before dinner  Patient declined the following medications for the month of November: No medications were declined for this month's delivery  Patient needs refills for the month of November: Pregabalin 150 mg (PCP medication that [facility] can send refill for)  I wasn't able to confirm the delivery date of 03/08/2022 1st Route as patient's daughter never returned call or responded to messages.  Unable to reach the patient's daughter to completed Medication Coordination form. Form was completed based on last month delivery. [facility] will contact patient to confirm delivery.AJunius Argyle CPP was notified I was unable to reach patient   TLynann Bologna CLower Grand LagoonPharmacist Assistant Phone: [phone]

## 2022-03-02 ENCOUNTER — Other Ambulatory Visit: Payer: Self-pay | Admitting: Family Medicine

## 2022-03-02 DIAGNOSIS — M545 Low back pain, unspecified: Secondary | ICD-10-CM

## 2022-03-03 ENCOUNTER — Other Ambulatory Visit: Payer: Self-pay | Admitting: Family Medicine

## 2022-03-03 DIAGNOSIS — I10 Essential (primary) hypertension: Secondary | ICD-10-CM

## 2022-03-03 DIAGNOSIS — F09 Unspecified mental disorder due to known physiological condition: Secondary | ICD-10-CM

## 2022-03-03 DIAGNOSIS — I700 Atherosclerosis of aorta: Secondary | ICD-10-CM

## 2022-03-03 DIAGNOSIS — E1142 Type 2 diabetes mellitus with diabetic polyneuropathy: Secondary | ICD-10-CM

## 2022-03-03 DIAGNOSIS — E785 Hyperlipidemia, unspecified: Secondary | ICD-10-CM

## 2022-03-12 ENCOUNTER — Telehealth: Payer: Self-pay

## 2022-03-12 NOTE — Progress Notes (Signed)
  Chronic Care Management   Note  03/12/2022 Name: William Parks MRN: [id number] DOB: 1945/10/28  William Parks is a 76 y.o. year old male who is a primary care patient of Alba Cory, MD. I reached out to William Parks by phone today in response to a referral sent by William Parks PCP.  William Parks  agreedto scheduling an appointment with the CCM RN Case Manager   Follow up plan: Patient agreed to scheduled appointment with RN Case Manager on 03/23/2022(date/time).   William Parks, RMA Care Guide [facility]  [place], [place] [address] Direct Dial: [phone] [email or web address]

## 2022-03-22 NOTE — Progress Notes (Deleted)
Name: William Parks   MRN: [id number]    DOB: 1945/11/12   Date:03/22/2022       Progress Note  Subjective  Chief Complaint  Follow Up  HPI  DMII: he was seen back in April 2021 and A1C was 12.4 %,  11.1 %  to 9.2 % and back in May  2022 10.5 % , up to 11.8 %  down to 11 %  and today is down to 7.9 % . He is now on dose of insulin  75/25 he is supposed to be taking 30 in am and 25 in pm, and also on  Ozempic 0.5 once a week. Since earlier this year his family has been monitoring his office visits and making sure he takes his medications. He is doing great now with A1C down to 7.9 % goal for him is A1C between 7-8 %  . He has associated HTN, dyslipidemia and neuropathy. He is taking Lyrica as prescribed for diabetic neuropathy and seems to be controlling symptoms. He cannot tolerate Metformin but is taking Actos. He is getting rx through [facility]. His son came in with him today - he lives in [place] , daughter was on the phone during our visit   HTN: he is taking losartan 100 daily but states not longer taking Metoprolol. BP is at goal . Denies chest pain or palpitation , he has someone with you all day and has been compliant with medication. BP is at goal    Dyslipidemia/Atherosclerosis of aorta : he is taking Atorvastatin daily and we will recheck labs next visit    Tobacco use: he quit May 4 th, 2022 with the nicotine patch. No cough or wheezing,    Intermittent low back pain: he states doing well lately.    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 He has been eating his meals and states occasionally still has some soreness on peri umbilical area, but no bulging or redness. His glucose is also under control and may be the  cause of weight gain  Family is very involved, checking on him and making sure he takes his medications.He has an aid during the aid  and a grandson that goes at night    Patient Active Problem List   Diagnosis Date Noted   Type 2 diabetes mellitus with peripheral neuropathy ([place]) 10/16/2021   S/P right colectomy 11/14/2020   Atherosclerosis of aorta ([place]) 10/03/2019   Neck and shoulder pain 11/08/2016   Vitamin D deficiency 10/15/2015   Dyslipidemia 06/02/2015   Hypertension, benign 04/01/2015    Past Surgical History:  Procedure Laterality Date   APPENDECTOMY     CYST REMOVAL TRUNK     2x   LAPAROSCOPY N/A 09/01/2020   Procedure: LAPAROSCOPY DIAGNOSTIC;  Surgeon: Olean Ree, MD;  Location: [facility] ORS;  Service: General;  Laterality: N/A;   PARTIAL COLECTOMY N/A 09/01/2020   Procedure: PARTIAL RIGHT COLECTOMY;  Surgeon: Olean Ree, MD;  Location: [facility] ORS;  Service: General;  Laterality: N/A;   SHOULDER SURGERY Right 2009   rotator cuff surgery    Family History  Problem Relation Age of Onset   Cancer Mother  Breast CA   Diabetes Mother    Hypertension Mother    Diabetes Brother     Social History   Tobacco Use   Smoking status: Former    Packs/day: 0.25    Years: 50.00    Total pack years: 12.50    Types: Cigarettes    Start date: 07/27/1967    Quit date: 08/27/2020    Years since quitting: 1.5   Smokeless tobacco: Never  Substance Use Topics   Alcohol use: No    Alcohol/week: 0.0 standard drinks of alcohol     Current Outpatient Medications:    aspirin 81 MG EC tablet, Take 81 mg by mouth daily., Disp: , Rfl: 5   atorvastatin (LIPITOR) 20 MG tablet, TAKE ONE TABLET BY MOUTH EVERYDAY AT BEDTIME, Disp: 90 tablet, Rfl: 1   azelastine (ASTELIN) 0.1 % nasal spray, Place 2 sprays into both nostrils 2 (two) times daily. Use in each nostril as directed, Disp: 30 mL, Rfl: 2   blood glucose meter kit and supplies, 1 each by Other route 2 (two) times daily as  needed for other. Dispense based on patient and insurance preference. Use up to four times daily as directed. (FOR ICD-10 E10.9, E11.9). Uncontrolled DM, Disp: 1 each, Rfl: 0   docusate sodium (COLACE) 100 MG capsule, TAKE ONE CAPSULE BY MOUTH Every Other Day at Bedtime, Disp: 15 capsule, Rfl: 5   glucose blood (ONETOUCH ULTRA) test strip, Use to check blood sugars four times daily as instructed, Disp: 100 strip, Rfl: 11   Insulin Lispro Prot & Lispro (HUMALOG 75/25 MIX) (75-25) 100 UNIT/ML Kwikpen, Inject 30 units into the skin before breakfast and 34 units before supper., Disp: 21 mL, Rfl: 2   Insulin Pen Needle (COMFORT EZ PEN NEEDLES) 31G X 6 MM MISC, USE one THREE TIMES DAILY AS NEEDED, Disp: 100 each, Rfl: 1   Lancets (ONETOUCH DELICA PLUS [id number]) MISC, Use to check blood sugars four times daily as instructed, Disp: 100 each, Rfl: 11   losartan (COZAAR) 100 MG tablet, TAKE ONE TABLET BY MOUTH EVERYDAY AT BEDTIME, Disp: 90 tablet, Rfl: 0   pioglitazone (ACTOS) 15 MG tablet, TAKE ONE TABLET BY MOUTH EVERYDAY AT BEDTIME, Disp: 90 tablet, Rfl: 1   pregabalin (LYRICA) 150 MG capsule, TAKE ONE CAPSULE BY MOUTH EVERYDAY AT BEDTIME, Disp: 90 capsule, Rfl: 0   Semaglutide,0.25 or 0.5MG/DOS, (OZEMPIC, 0.25 OR 0.5 MG/DOSE,) 2 MG/3ML SOPN, Inject 0.5 mg into the skin once a week., Disp: 3 mL, Rfl: 2   tiZANidine (ZANAFLEX) 2 MG tablet, TAKE 1 TABLET BY MOUTH EVERY DAY IN THE EVENING, Disp: 90 tablet, Rfl: 0  No Known Allergies  I personally reviewed active problem list, medication list, allergies, family history, social history, health maintenance with the patient/caregiver today.   ROS  ***  Objective  There were no vitals filed for this visit.  There is no height or weight on file to calculate BMI.  Physical Exam ***  No results found for this or any previous visit (from the past 2160 hour(s)).   PHQ2/9:    10/16/2021   11:06 AM 08/27/2021   11:55 AM 05/25/2021   10:53 AM 05/08/2021     1:02 PM 05/07/2021   11:24 AM  Depression screen PHQ 2/9  Decreased Interest 0 0 0 0 0  Down, Depressed, Hopeless 0 2 0 0 0  PHQ - 2 Score 0 2 0 0 0  Altered sleeping  3 0 0 0  Tired, decreased  energy  1 0 0 0  Change in appetite  1 0 0 0  Feeling bad or failure about yourself   0 0 0 0  Trouble concentrating  1 0 0 0  Moving slowly or fidgety/restless  1 0 0 0  Suicidal thoughts  0 0 0 0  PHQ-9 Score  9 0 0 0  Difficult doing work/chores  Not difficult at all       phq 9 is {gen pos [id number]}   Fall Risk:    10/16/2021   11:06 AM 08/27/2021   11:59 AM 05/25/2021   10:53 AM 05/08/2021    1:02 PM 05/07/2021   11:23 AM  Fall Risk   Falls in the past year? 0 0 1 0 0  Number falls in past yr: 0 0 1 0 0  Injury with Fall? 0 0 0 0 0  Risk for fall due to : No Fall Risks No Fall Risks Impaired balance/gait No Fall Risks No Fall Risks  Follow up _0       Functional Status Survey:      Assessment & Plan  *** There are no diagnoses linked to this encounter.

## 2022-03-23 ENCOUNTER — Telehealth: Payer: Medicare Other

## 2022-03-23 ENCOUNTER — Ambulatory Visit: Payer: Medicare Other | Admitting: Family Medicine

## 2022-03-25 ENCOUNTER — Telehealth: Payer: Self-pay

## 2022-03-25 NOTE — Progress Notes (Signed)
  Chronic Care Management Pharmacy Assistant   Name: William Parks  MRN: [id number] DOB: 01/17/1946  Reason for Encounter: Medication Review/Medication Coordination for [facility]   Recent office visits:  None ID  Recent consult visits:  None ID  Hospital visits:  None in previous 6 months  Medications: Outpatient Encounter Medications as of 03/25/2022  Medication Sig   aspirin 81 MG EC tablet Take 81 mg by mouth daily.   atorvastatin (LIPITOR) 20 MG tablet TAKE ONE TABLET BY MOUTH EVERYDAY AT BEDTIME   azelastine (ASTELIN) 0.1 % nasal spray Place 2 sprays into both nostrils 2 (two) times daily. Use in each nostril as directed   blood glucose meter kit and supplies 1 each by Other route 2 (two) times daily as needed for other. Dispense based on patient and insurance preference. Use up to four times daily as directed. (FOR ICD-10 E10.9, E11.9). Uncontrolled DM   docusate sodium (COLACE) 100 MG capsule TAKE ONE CAPSULE BY MOUTH Every Other Day at Bedtime   glucose blood (ONETOUCH ULTRA) test strip Use to check blood sugars four times daily as instructed   Insulin Lispro Prot & Lispro (HUMALOG 75/25 MIX) (75-25) 100 UNIT/ML Kwikpen Inject 30 units into the skin before breakfast and 34 units before supper.   Insulin Pen Needle (COMFORT EZ PEN NEEDLES) 31G X 6 MM MISC USE one THREE TIMES DAILY AS NEEDED   Lancets (ONETOUCH DELICA PLUS [id number]) MISC Use to check blood sugars four times daily as instructed   losartan (COZAAR) 100 MG tablet TAKE ONE TABLET BY MOUTH EVERYDAY AT BEDTIME   pioglitazone (ACTOS) 15 MG tablet TAKE ONE TABLET BY MOUTH EVERYDAY AT BEDTIME   pregabalin (LYRICA) 150 MG capsule TAKE ONE CAPSULE BY MOUTH EVERYDAY AT BEDTIME   Semaglutide,0.25 or 0.5MG/DOS, (OZEMPIC, 0.25 OR 0.5 MG/DOSE,) 2 MG/3ML SOPN Inject 0.5 mg into the skin once a week.   tiZANidine (ZANAFLEX) 2 MG tablet TAKE 1 TABLET BY MOUTH EVERY DAY IN THE EVENING   No facility-administered  encounter medications on file as of 03/25/2022.   Tetanus/TDAP Zoster Vaccine Diabetic Eye Exam Influenza Vaccine Diabetic Kidney Evaluation Medicare Wellness Exam   Star Rating Drugs: Atorvastatin 20 mg last filled on 03/03/2022 for a 30-Day supply with [facility] Losartan 100 mg last filled on 03/03/2022 for a 30-Day supply with [facility] Pioglitazone 15 mg last filled on 03/03/2022 for a 30-Day supply with [facility] Ozempic 0.5 mg last filled on 03/03/2022 for a 30-Day supply with [facility]  BP Readings from Last 3 Encounters:  10/16/21 132/68  05/25/21 (!) 142/70  05/08/21 138/82    Lab Results  Component Value Date   HGBA1C 7.9 (A) 10/16/2021    Patient obtains medications through Adherence Packaging  30 Days   Last adherence delivery included:  Pen Needles 31 g 6 mm use three times daily Docusate Sodium 100 mg 1 capsule every other day (Bedtime) Atorvastatin 20 mg take 1 tablet daily (Bedtime) Pioglitazone 15 mg 1 capsule daily (Bedtime) Pregabalin 150 mg 1 capsule daily (Bedtime) Tizanidine 2 mg 1 tablet daily (Bedtime) Losartan 100 mg 1 tablet daily (Bedtime) Ozempic 2 mg/3mL Inject 0.5 mg weekly Lancets OneTouch Delica Plus OneTouch Ultra Test Strips Humalog 75/25 mix Inject twice daily 30 units in the a.m. and 34 units before dinner  Patient declined medications for the month of November: Although I never spoke with anyone regarding last month's delivery I am unaware if there was any medications declined.  Patient is   due for next adherence delivery on: 04/06/2022 1st Route.  Called patient and reviewed medications and coordinated delivery.  This delivery to include: Pen Needles 31 g 6 mm use three times daily Docusate Sodium 100 mg 1 capsule every other day (Bedtime) Atorvastatin 20 mg take 1 tablet daily (Bedtime) Pioglitazone 15 mg 1 capsule daily (Bedtime) Pregabalin 150 mg 1 capsule daily (Bedtime) Tizanidine 2 mg 1  tablet daily (Bedtime) Losartan 100 mg 1 tablet daily (Bedtime) Ozempic 2 mg/61m Inject 0.5 mg weekly Lancets OneTouch Delica Plus OneTouch Ultra Test Strips Humalog 75/25 mix Inject twice daily 30 units in the a.m. and 34 units before dinner  Patient declined the following medications for the month of December: No medications were declined  Patient needs refills for Pregabalin 150 mg. This is a PCP medication.  Confirmed delivery date of 04/06/2022 1st Route, advised patient that pharmacy will contact them the morning of delivery.  Patient has follow-up with CPP on 05/12/2022 @ 1500.  TLynann Bologna CPA/CMA Clinical Pharmacist Assistant Phone: [phone]

## 2022-03-31 ENCOUNTER — Other Ambulatory Visit: Payer: Self-pay | Admitting: Family Medicine

## 2022-04-02 NOTE — Progress Notes (Unsigned)
Name: William Parks   MRN: [id number]    DOB: 25-Feb-1946   Date:04/02/2022       Progress Note  Subjective  Chief Complaint  Follow Up  HPI  DMII: he was seen back in April 2021 and A1C was 12.4 %,  11.1 %  to 9.2 % and back in May  2022 10.5 % , up to 11.8 %  down to 11 %  and today is down to 7.9 % . He is now on dose of insulin  75/25 he is supposed to be taking 30 in am and 25 in pm, and also on  Ozempic 0.5 once a week. Since earlier this year his family has been monitoring his office visits and making sure he takes his medications. He is doing great now with A1C down to 7.9 % goal for him is A1C between 7-8 %  . He has associated HTN, dyslipidemia and neuropathy. He is taking Lyrica as prescribed for diabetic neuropathy and seems to be controlling symptoms. He cannot tolerate Metformin but is taking Actos. He is getting rx through [facility]. His son came in with him today - he lives in [place] , daughter was on the phone during our visit   HTN: he is taking losartan 100 daily but states not longer taking Metoprolol. BP is at goal . Denies chest pain or palpitation , he has someone with you all day and has been compliant with medication. BP is at goal    Dyslipidemia/Atherosclerosis of aorta : he is taking Atorvastatin daily and we will recheck labs next visit    Tobacco use: he quit May 4 th, 2022 with the nicotine patch. No cough or wheezing,    Intermittent low back pain: he states doing well lately.    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 He has been eating his meals and states occasionally still has some soreness on peri umbilical area, but no bulging or redness. His glucose is also under control and may be the  cause of weight gain  Family is very involved, checking on him and making sure he takes his medications.He has an aid during the aid  and a grandson that goes at night    Patient Active Problem List   Diagnosis Date Noted   Type 2 diabetes mellitus with peripheral neuropathy ([place]) 10/16/2021   S/P right colectomy 11/14/2020   Atherosclerosis of aorta ([place]) 10/03/2019   Neck and shoulder pain 11/08/2016   Vitamin D deficiency 10/15/2015   Dyslipidemia 06/02/2015   Hypertension, benign 04/01/2015    Past Surgical History:  Procedure Laterality Date   APPENDECTOMY     CYST REMOVAL TRUNK     2x   LAPAROSCOPY N/A 09/01/2020   Procedure: LAPAROSCOPY DIAGNOSTIC;  Surgeon: Olean Ree, MD;  Location: [facility] ORS;  Service: General;  Laterality: N/A;   PARTIAL COLECTOMY N/A 09/01/2020   Procedure: PARTIAL RIGHT COLECTOMY;  Surgeon: Olean Ree, MD;  Location: [facility] OR[facility];  Service: General;  Laterality: N/A;   SHOULDER SURGERY Right 2009   rotator cuff surgery    Family History  Problem Relation Age of Onset   Cancer Mother  Breast CA   Diabetes Mother    Hypertension Mother    Diabetes Brother     Social History   Tobacco Use   Smoking status: Former    Packs/day: 0.25    Years: 50.00    Total pack years: 12.50    Types: Cigarettes    Start date: 07/27/1967    Quit date: 08/27/2020    Years since quitting: 1.5   Smokeless tobacco: Never  Substance Use Topics   Alcohol use: No    Alcohol/week: 0.0 standard drinks of alcohol     Current Outpatient Medications:    aspirin 81 MG EC tablet, Take 81 mg by mouth daily., Disp: , Rfl: 5   atorvastatin (LIPITOR) 20 MG tablet, TAKE ONE TABLET BY MOUTH EVERYDAY AT BEDTIME, Disp: 90 tablet, Rfl: 1   azelastine (ASTELIN) 0.1 % nasal spray, Place 2 sprays into both nostrils 2 (two) times daily. Use in each nostril as directed, Disp: 30 mL, Rfl: 2   blood glucose meter kit and supplies, 1 each by Other route 2 (two) times daily as  needed for other. Dispense based on patient and insurance preference. Use up to four times daily as directed. (FOR ICD-10 E10.9, E11.9). Uncontrolled DM, Disp: 1 each, Rfl: 0   COMFORT EZ PEN NEEDLES 31G X 6 MM MISC, USE one THREE TIMES DAILY AS NEEDED, Disp: 100 each, Rfl: 1   docusate sodium (COLACE) 100 MG capsule, TAKE ONE CAPSULE BY MOUTH Every Other Day at Bedtime, Disp: 15 capsule, Rfl: 5   glucose blood (ONETOUCH ULTRA) test strip, Use to check blood sugars four times daily as instructed, Disp: 100 strip, Rfl: 11   Insulin Lispro Prot & Lispro (HUMALOG 75/25 MIX) (75-25) 100 UNIT/ML Kwikpen, Inject 30 units into the skin before breakfast and 34 units before supper., Disp: 21 mL, Rfl: 2   Lancets (ONETOUCH DELICA PLUS [id number]) MISC, Use to check blood sugars four times daily as instructed, Disp: 100 each, Rfl: 11   losartan (COZAAR) 100 MG tablet, TAKE ONE TABLET BY MOUTH EVERYDAY AT BEDTIME, Disp: 90 tablet, Rfl: 0   pioglitazone (ACTOS) 15 MG tablet, TAKE ONE TABLET BY MOUTH EVERYDAY AT BEDTIME, Disp: 90 tablet, Rfl: 1   pregabalin (LYRICA) 150 MG capsule, TAKE ONE CAPSULE BY MOUTH EVERYDAY AT BEDTIME, Disp: 90 capsule, Rfl: 0   Semaglutide,0.25 or 0.5MG/DOS, (OZEMPIC, 0.25 OR 0.5 MG/DOSE,) 2 MG/3ML SOPN, Inject 0.5 mg into the skin once a week., Disp: 3 mL, Rfl: 2   tiZANidine (ZANAFLEX) 2 MG tablet, TAKE 1 TABLET BY MOUTH EVERY DAY IN THE EVENING, Disp: 90 tablet, Rfl: 0  No Known Allergies  I personally reviewed active problem list, medication list, allergies, family history, social history, health maintenance with the patient/caregiver today.   ROS  ***  Objective  There were no vitals filed for this visit.  There is no height or weight on file to calculate BMI.  Physical Exam ***  No results found for this or any previous visit (from the past 2160 hour(s)).   PHQ2/9:    10/16/2021   11:06 AM 08/27/2021   11:55 AM 05/25/2021   10:53 AM 05/08/2021    1:02 PM 05/07/2021    11:24 AM  Depression screen PHQ 2/9  Decreased Interest 0 0 0 0 0  Down, Depressed, Hopeless 0 2 0 0 0  PHQ - 2 Score 0 2 0 0 0  Altered sleeping  3 0 0 0  Tired, decreased energy  1  0 0 0  Change in appetite  1 0 0 0  Feeling bad or failure about yourself   0 0 0 0  Trouble concentrating  1 0 0 0  Moving slowly or fidgety/restless  1 0 0 0  Suicidal thoughts  0 0 0 0  PHQ-9 Score  9 0 0 0  Difficult doing work/chores  Not difficult at all       phq 9 is {gen pos [id number]}   Fall Risk:    10/16/2021   11:06 AM 08/27/2021   11:59 AM 05/25/2021   10:53 AM 05/08/2021    1:02 PM 05/07/2021   11:23 AM  Fall Risk   Falls in the past year? 0 0 1 0 0  Number falls in past yr: 0 0 1 0 0  Injury with Fall? 0 0 0 0 0  Risk for fall due to : No Fall Risks No Fall Risks Impaired balance/gait No Fall Risks No Fall Risks  Follow up _0       Functional Status Survey:      Assessment & Plan  *** There are no diagnoses linked to this encounter.

## 2022-04-05 ENCOUNTER — Ambulatory Visit (INDEPENDENT_AMBULATORY_CARE_PROVIDER_SITE_OTHER): Payer: Medicare Other | Admitting: Family Medicine

## 2022-04-05 ENCOUNTER — Encounter: Payer: Self-pay | Admitting: Family Medicine

## 2022-04-05 VITALS — BP 130/68 | HR 81 | Temp 97.7°F | Resp 16 | Ht 68.0 in | Wt 204.1 lb

## 2022-04-05 DIAGNOSIS — E1169 Type 2 diabetes mellitus with other specified complication: Secondary | ICD-10-CM

## 2022-04-05 DIAGNOSIS — E1142 Type 2 diabetes mellitus with diabetic polyneuropathy: Secondary | ICD-10-CM | POA: Diagnosis not present

## 2022-04-05 DIAGNOSIS — I10 Essential (primary) hypertension: Secondary | ICD-10-CM

## 2022-04-05 DIAGNOSIS — I152 Hypertension secondary to endocrine disorders: Secondary | ICD-10-CM

## 2022-04-05 DIAGNOSIS — I700 Atherosclerosis of aorta: Secondary | ICD-10-CM

## 2022-04-05 DIAGNOSIS — E1159 Type 2 diabetes mellitus with other circulatory complications: Secondary | ICD-10-CM

## 2022-04-05 DIAGNOSIS — F09 Unspecified mental disorder due to known physiological condition: Secondary | ICD-10-CM

## 2022-04-05 DIAGNOSIS — M545 Low back pain, unspecified: Secondary | ICD-10-CM

## 2022-04-05 DIAGNOSIS — E559 Vitamin D deficiency, unspecified: Secondary | ICD-10-CM

## 2022-04-05 DIAGNOSIS — E785 Hyperlipidemia, unspecified: Secondary | ICD-10-CM

## 2022-04-05 DIAGNOSIS — Z23 Encounter for immunization: Secondary | ICD-10-CM | POA: Diagnosis not present

## 2022-04-05 LAB — POCT GLYCOSYLATED HEMOGLOBIN (HGB A1C): Hemoglobin A1C: 8.1 % — AB (ref 4.0–5.6)

## 2022-04-05 MED ORDER — SEMAGLUTIDE (1 MG/DOSE) 4 MG/3ML ~~LOC~~ SOPN: 1.0000 mg | PEN_INJECTOR | SUBCUTANEOUS | 2 refills | Status: DC

## 2022-04-05 MED ORDER — ATORVASTATIN CALCIUM 20 MG PO TABS: ORAL_TABLET | ORAL | 1 refills | Status: DC

## 2022-04-05 MED ORDER — PIOGLITAZONE HCL 15 MG PO TABS: 15.0000 mg | ORAL_TABLET | Freq: Every day | ORAL | 1 refills | Status: DC

## 2022-04-05 MED ORDER — LOSARTAN POTASSIUM 100 MG PO TABS: ORAL_TABLET | ORAL | 1 refills | Status: DC

## 2022-04-05 MED ORDER — TIZANIDINE HCL 2 MG PO TABS: ORAL_TABLET | ORAL | 0 refills | Status: DC

## 2022-04-06 LAB — CBC WITH DIFFERENTIAL/PLATELET
Absolute Monocytes: 303 cells/uL (ref 200–950)
Basophils Absolute: 31 cells/uL (ref 0–200)
Basophils Relative: 0.9 %
Eosinophils Absolute: 31 cells/uL (ref 15–500)
Eosinophils Relative: 0.9 %
HCT: 41.7 % (ref 38.5–50.0)
Hemoglobin: 14 g/dL (ref 13.2–17.1)
Lymphs Abs: 1023 cells/uL (ref 850–3900)
MCH: 29.5 pg (ref 27.0–33.0)
MCHC: 33.6 g/dL (ref 32.0–36.0)
MCV: 88 fL (ref 80.0–100.0)
MPV: 11.1 fL (ref 7.5–12.5)
Monocytes Relative: 8.9 %
Neutro Abs: 2013 cells/uL (ref 1500–7800)
Neutrophils Relative %: 59.2 %
Platelets: 214 10*3/uL (ref 140–400)
RBC: 4.74 10*6/uL (ref 4.20–5.80)
RDW: 13 % (ref 11.0–15.0)
Total Lymphocyte: 30.1 %
WBC: 3.4 10*3/uL — ABNORMAL LOW (ref 3.8–10.8)

## 2022-04-06 LAB — COMPLETE METABOLIC PANEL WITH GFR
AG Ratio: 1.4 (calc) (ref 1.0–2.5)
ALT: 20 U/L (ref 9–46)
AST: 17 U/L (ref 10–35)
Albumin: 4 g/dL (ref 3.6–5.1)
Alkaline phosphatase (APISO): 69 U/L (ref 35–144)
BUN: 17 mg/dL (ref 7–25)
CO2: 26 mmol/L (ref 20–32)
Calcium: 8.9 mg/dL (ref 8.6–10.3)
Chloride: 102 mmol/L (ref 98–110)
Creat: 1.03 mg/dL (ref 0.70–1.28)
Globulin: 2.9 g/dL (calc) (ref 1.9–3.7)
Glucose, Bld: 199 mg/dL — ABNORMAL HIGH (ref 65–99)
Potassium: 4.2 mmol/L (ref 3.5–5.3)
Sodium: 140 mmol/L (ref 135–146)
Total Bilirubin: 1.3 mg/dL — ABNORMAL HIGH (ref 0.2–1.2)
Total Protein: 6.9 g/dL (ref 6.1–8.1)
eGFR: 75 mL/min/{1.73_m2} (ref >=60)

## 2022-04-06 LAB — MICROALBUMIN / CREATININE URINE RATIO
Creatinine, Urine: 156 mg/dL (ref 20–320)
Microalb Creat Ratio: 44 mcg/mg creat — ABNORMAL HIGH (ref <30)
Microalb, Ur: 6.8 mg/dL

## 2022-04-06 LAB — LIPID PANEL
Cholesterol: 142 mg/dL (ref <200)
HDL: 82 mg/dL (ref >=40)
LDL Cholesterol (Calc): 46 mg/dL (calc)
Non-HDL Cholesterol (Calc): 60 mg/dL (calc) (ref <130)
Total CHOL/HDL Ratio: 1.7 (calc) (ref <5.0)
Triglycerides: 59 mg/dL (ref <150)

## 2022-04-13 ENCOUNTER — Telehealth: Payer: Medicare Other

## 2022-05-12 ENCOUNTER — Telehealth: Payer: Medicare Other

## 2022-05-28 ENCOUNTER — Other Ambulatory Visit: Payer: Self-pay | Admitting: Family Medicine

## 2022-05-28 DIAGNOSIS — M545 Low back pain, unspecified: Secondary | ICD-10-CM

## 2022-05-28 DIAGNOSIS — I700 Atherosclerosis of aorta: Secondary | ICD-10-CM

## 2022-05-28 DIAGNOSIS — E1165 Type 2 diabetes mellitus with hyperglycemia: Secondary | ICD-10-CM

## 2022-05-28 DIAGNOSIS — E1169 Type 2 diabetes mellitus with other specified complication: Secondary | ICD-10-CM

## 2022-05-31 ENCOUNTER — Other Ambulatory Visit: Payer: Self-pay

## 2022-05-31 MED ORDER — DOCUSATE SODIUM 100 MG PO CAPS: ORAL_CAPSULE | ORAL | 1 refills | Status: DC

## 2022-06-22 ENCOUNTER — Ambulatory Visit: Payer: 59 | Admitting: Internal Medicine

## 2022-06-22 NOTE — Progress Notes (Deleted)
Name: William Parks  MRN/ DOB: [id number], 10/27/1945   Age/ Sex: 77 y.o., male    PCP: Steele Sizer, MD   Reason for Endocrinology Evaluation: Type 2 Diabetes Mellitus     Date of Initial Endocrinology Visit: 06/22/2022     PATIENT IDENTIFIER: Mr. William Parks is a 77 y.o. male with a past medical history of DM, HTN and dyslipidemia . The patient presented for initial endocrinology clinic visit on 06/22/2022 for consultative assistance with his diabetes management.    HPI: Mr. William Parks was    Diagnosed with DM *** Prior Medications tried/Intolerance: *** Currently checking blood sugars *** x / day,  before breakfast and ***.  Hypoglycemia episodes : ***               Symptoms: ***                 Frequency: ***/  Hemoglobin A1c has ranged from 6.8% in 2018, peaking at 11.8% in 2022.   In terms of diet, the patient ***   HOME DIABETES REGIMEN: Humalog mix  Ozempic 1 mg weekly  Pioglitazone 15 mg daily     Statin: yes ACE-I/ARB: yes   METER DOWNLOAD SUMMARY: Date range evaluated: *** Fingerstick Blood Glucose Tests = *** Average Number Tests/Day = *** Overall Mean FS Glucose = *** Standard Deviation = ***  BG Ranges: Low = *** High = ***   Hypoglycemic Events/30 Days: BG < 50 = *** Episodes of symptomatic severe hypoglycemia = ***   DIABETIC COMPLICATIONS: Microvascular complications:  *** Denies: CKD Last eye exam: Completed   Macrovascular complications:   Denies: CAD, PVD, CVA   PAST HISTORY: Past Medical History:  Past Medical History:  Diagnosis Date   Arthritis    rt shoulder   Diabetes mellitus without complication ([place])    Hyperlipidemia    Hyperlipidemia associated with type 2 diabetes mellitus ([place]) 06/02/2015   Hypertension    Past Surgical History:  Past Surgical History:  Procedure Laterality Date   APPENDECTOMY     CYST REMOVAL TRUNK     2x   LAPAROSCOPY N/A 09/01/2020   Procedure: LAPAROSCOPY DIAGNOSTIC;  Surgeon:  Olean Ree, MD;  Location: [facility] ORS;  Service: General;  Laterality: N/A;   PARTIAL COLECTOMY N/A 09/01/2020   Procedure: PARTIAL RIGHT COLECTOMY;  Surgeon: Olean Ree, MD;  Location: [facility] ORS;  Service: General;  Laterality: N/A;   SHOULDER SURGERY Right 2009   rotator cuff surgery    Social History:  reports that he quit smoking about 21 months ago. His smoking use included cigarettes. He started smoking about 54 years ago. He has a 12.50 pack-year smoking history. He has never used smokeless tobacco. He reports that he does not drink alcohol and does not use drugs. Family History:  Family History  Problem Relation Age of Onset   Cancer Mother        Breast CA   Diabetes Mother    Hypertension Mother    Diabetes Brother      HOME MEDICATIONS: Allergies as of 06/22/2022   No Known Allergies      Medication List        Accurate as of June 22, 2022  1:02 PM. If you have any questions, ask your nurse or doctor.          aspirin EC 81 MG tablet Take 81 mg by mouth daily.   atorvastatin 20 MG tablet Commonly known as: LIPITOR TAKE ONE TABLET BY MOUTH EVERYDAY AT  BEDTIME   azelastine 0.1 % nasal spray Commonly known as: ASTELIN Place 2 sprays into both nostrils 2 (two) times daily. Use in each nostril as directed   blood glucose meter kit and supplies 1 each by Other route 2 (two) times daily as needed for other. Dispense based on patient and insurance preference. Use up to four times daily as directed. (FOR ICD-10 E10.9, E11.9). Uncontrolled DM   Comfort EZ Pen Needles 31G X 6 MM Misc Generic drug: Insulin Pen Needle USE TO inject insulin three times daily AS DIRECTED   docusate sodium 100 MG capsule Commonly known as: COLACE TAKE ONE CAPSULE BY MOUTH Every Other Day at Bedtime   Insulin Lispro Prot & Lispro (75-25) 100 UNIT/ML Kwikpen Commonly known as: HUMALOG 75/25 MIX Inject 30 units into THE SKIN BEFORE breakfast AND 34 units BEFORE SUPPER    losartan 100 MG tablet Commonly known as: COZAAR TAKE ONE TABLET BY MOUTH EVERYDAY AT BEDTIME   OneTouch Delica Plus 0000000 Misc Use to check blood sugars four times daily as instructed   OneTouch Ultra test strip Generic drug: glucose blood Use to check blood sugars four times daily as instructed   pioglitazone 15 MG tablet Commonly known as: ACTOS Take 1 tablet (15 mg total) by mouth daily.   pregabalin 150 MG capsule Commonly known as: LYRICA TAKE ONE CAPSULE BY MOUTH EVERYDAY AT BEDTIME   Semaglutide (1 MG/DOSE) 4 MG/3ML Sopn Inject 1 mg as directed once a week.   tiZANidine 2 MG tablet Commonly known as: ZANAFLEX TAKE 1 TABLET BY MOUTH EVERY DAY IN THE EVENING         ALLERGIES: No Known Allergies   REVIEW OF SYSTEMS: A comprehensive ROS was conducted with the patient and is negative except as per HPI and below:  ROS    OBJECTIVE:   VITAL SIGNS: There were no vitals taken for this visit.   PHYSICAL EXAM:  General: Pt appears well and is in NAD  Neck: General: Supple without adenopathy or carotid bruits. Thyroid: Thyroid size normal.  No goiter or nodules appreciated.   Lungs: Clear with good BS bilat with no rales, rhonchi, or wheezes  Heart: RRR   Abdomen:  soft, nontender  Extremities:  Lower extremities - No pretibial edema. No lesions.  Skin: Normal texture and temperature to palpation. No rash noted.  Neuro: MS is good with appropriate affect, pt is alert and Ox3    DM foot exam:    DATA REVIEWED:  Lab Results  Component Value Date   HGBA1C 8.1 (A) 04/05/2022   HGBA1C 7.9 (A) 10/16/2021   HGBA1C 11.0 (A) 05/25/2021    Latest Reference Range & Units 04/05/22 11:54  Sodium 135 - 146 mmol/L 140  Potassium 3.5 - 5.3 mmol/L 4.2  Chloride 98 - 110 mmol/L 102  CO2 20 - 32 mmol/L 26  Glucose 65 - 99 mg/dL 199 (H)  BUN 7 - 25 mg/dL 17  Creatinine 0.70 - 1.28 mg/dL 1.03  Calcium 8.6 - 10.3 mg/dL 8.9  BUN/Creatinine Ratio 6 - 22 (calc)  SEE NOTE:  eGFR > OR = 60 mL/min/1.44m 75  AG Ratio 1.0 - 2.5 (calc) 1.4  AST 10 - 35 U/L 17  ALT 9 - 46 U/L 20  Total Protein 6.1 - 8.1 g/dL 6.9  Total Bilirubin 0.2 - 1.2 mg/dL 1.3 (H)  (H): Data is abnormally high    ASSESSMENT / PLAN / RECOMMENDATIONS:   1) Type 2 Diabetes Mellitus, ***controlled, With***  complications - Most recent A1c of *** %. Goal A1c < 7.0 %.    Plan: GENERAL: ***  MEDICATIONS: ***  EDUCATION / INSTRUCTIONS: BG monitoring instructions: Patient is instructed to check his blood sugars *** times a day, ***. Call [place] [place] [facility] if: BG persistently < 70  I reviewed the Rule of 15 for the treatment of hypoglycemia in detail with the patient. Literature supplied.   2) Diabetic complications:  Eye: Does *** have known diabetic retinopathy.  Neuro/ Feet: Does *** have known diabetic peripheral neuropathy. Renal: Patient does not have known baseline CKD. He is *** on an ACEI/ARB at present.  3) Lipids: Patient is *** on a statin.       Signed electronically by: Mack Guise, MD  [facility] Endocrinology  [facility] [address], [place], [place] [address] Phone: [phone] FAX: [phone]   CC: Steele Sizer, [place] [address] [place] [address] Phone: [phone]  Fax: [phone]    Return to Endocrinology clinic as below: Future Appointments  Date Time Provider [place]  06/22/2022  2:20 PM Keighley Deckman, Melanie Crazier, MD [facility] None  08/06/2022  2:40 PM Steele Sizer, MD [place] [facility]  08/31/2022 11:15 AM [place] ADVISOR [place]

## 2022-06-30 ENCOUNTER — Other Ambulatory Visit: Payer: Self-pay | Admitting: Family Medicine

## 2022-06-30 DIAGNOSIS — E1142 Type 2 diabetes mellitus with diabetic polyneuropathy: Secondary | ICD-10-CM

## 2022-06-30 DIAGNOSIS — I152 Hypertension secondary to endocrine disorders: Secondary | ICD-10-CM

## 2022-06-30 DIAGNOSIS — E1169 Type 2 diabetes mellitus with other specified complication: Secondary | ICD-10-CM

## 2022-08-03 ENCOUNTER — Telehealth: Payer: Self-pay | Admitting: Family Medicine

## 2022-08-03 NOTE — Telephone Encounter (Signed)
Contacted William Parks to schedule their annual wellness visit. Appointment made for 08/14/2022.  [facility] Guide [facility] TEAM Direct Dial: [phone]

## 2022-08-05 NOTE — Progress Notes (Deleted)
Name: William Parks   MRN: [id number]    DOB: 05/30/1945   Date:08/05/2022       Progress Note  Subjective  Chief Complaint  Follow Up  HPI  DMII: he was seen back in April 2021 and A1C was 12.4 %,  11.1 %  to 9.2 % and back in May  2022 10.5 % , up to 11.8 %  down to 11 %  after that it went down to 7.9 % today is up to 8.1 %  . He is now on dose of insulin  75/25 Ozempic 0.5 once a week, daughter is not sure how compliant he is with insulin but seems to be taking it after meal and not before. Since earlier this year his family has been monitoring his office visits because of his memory loss.  He has associated HTN, dyslipidemia and neuropathy. He is taking Lyrica as prescribed for diabetic neuropathy and seems to be controlling symptoms. He cannot tolerate Metformin but is taking Actos. He is getting rx through [facility]pill [facility]. His son lives in [place] , daughter lives in [place] , he came with his brother but his daughter was on the phone during our visit   HTN: he is taking losartan 100 daily but states not longer taking Metoprolol. BP is at goal . Denies chest pain or palpitation , he has someone with you all day and has been compliant with medication. BP is at goal again today    Dyslipidemia/Atherosclerosis of aorta : he is taking Atorvastatin daily and we will recheck labs today   Tobacco use: he quit May 4 th, 2022 with the nicotine patch. No cough or wheezing,   Patient Active Problem List   Diagnosis Date Noted   Type 2 diabetes mellitus with peripheral neuropathy 10/16/2021   S/P right colectomy 11/14/2020   Atherosclerosis of aorta 10/03/2019   Neck and shoulder pain 11/08/2016   Vitamin D deficiency 10/15/2015   Dyslipidemia 06/02/2015   Hypertension, benign 04/01/2015    Past Surgical History:  Procedure Laterality Date   APPENDECTOMY     CYST REMOVAL TRUNK     2x   LAPAROSCOPY N/A 09/01/2020   Procedure: LAPAROSCOPY DIAGNOSTIC;  Surgeon: Henrene DodgePiscoya, Jose, MD;   Location: [facility] ORS;  Service: General;  Laterality: N/A;   PARTIAL COLECTOMY N/A 09/01/2020   Procedure: PARTIAL RIGHT COLECTOMY;  Surgeon: Henrene DodgePiscoya, Jose, MD;  Location: [facility] ORS;  Service: General;  Laterality: N/A;   SHOULDER SURGERY Right 2009   rotator cuff surgery    Family History  Problem Relation Age of Onset   Cancer Mother        Breast CA   Diabetes Mother    Hypertension Mother    Diabetes Brother     Social History   Tobacco Use   Smoking status: Former    Packs/day: 0.25    Years: 50.00    Additional pack years: 0.00    Total pack years: 12.50    Types: Cigarettes    Start date: 07/27/1967    Quit date: 08/27/2020    Years since quitting: 1.9   Smokeless tobacco: Never  Substance Use Topics   Alcohol use: No    Alcohol/week: 0.0 standard drinks of alcohol     Current Outpatient Medications:    aspirin 81 MG EC tablet, Take 81 mg by mouth daily., Disp: , Rfl: 5   atorvastatin (LIPITOR) 20 MG tablet, TAKE ONE TABLET BY MOUTH EVERYDAY AT BEDTIME, Disp: 90 tablet, Rfl: 1  azelastine (ASTELIN) 0.1 % nasal spray, Place 2 sprays into both nostrils 2 (two) times daily. Use in each nostril as directed, Disp: 30 mL, Rfl: 2   blood glucose meter kit and supplies, 1 each by Other route 2 (two) times daily as needed for other. Dispense based on patient and insurance preference. Use up to four times daily as directed. (FOR ICD-10 E10.9, E11.9). Uncontrolled DM, Disp: 1 each, Rfl: 0   docusate sodium (COLACE) 100 MG capsule, TAKE ONE CAPSULE BY MOUTH Every Other Day at Bedtime, Disp: 15 capsule, Rfl: 1   glucose blood (ONETOUCH ULTRA) test strip, Use to check blood sugars four times daily as instructed, Disp: 100 strip, Rfl: 11   Insulin Lispro Prot & Lispro (HUMALOG 75/25 MIX) (75-25) 100 UNIT/ML Kwikpen, Inject 30 units into THE SKIN BEFORE breakfast AND 34 units BEFORE SUPPER, Disp: 21 mL, Rfl: 2   Insulin Pen Needle (COMFORT EZ PEN NEEDLES) 31G X 6 MM MISC, USE TO inject  insulin three times daily AS DIRECTED, Disp: 100 each, Rfl: 2   Lancets (ONETOUCH DELICA PLUS LANCET33G) MISC, Use to check blood sugars four times daily as instructed, Disp: 100 each, Rfl: 11   losartan (COZAAR) 100 MG tablet, TAKE ONE TABLET BY MOUTH EVERYDAY AT BEDTIME, Disp: 90 tablet, Rfl: 1   pioglitazone (ACTOS) 15 MG tablet, Take 1 tablet (15 mg total) by mouth daily., Disp: 90 tablet, Rfl: 1   pregabalin (LYRICA) 150 MG capsule, TAKE ONE CAPSULE BY MOUTH EVERYDAY AT BEDTIME, Disp: 90 capsule, Rfl: 0   Semaglutide, 1 MG/DOSE, (OZEMPIC, 1 MG/DOSE,) 4 MG/3ML SOPN, INJECT ONE MG as directed ONCE A WEEK, Disp: 3 mL, Rfl: 0   tiZANidine (ZANAFLEX) 2 MG tablet, TAKE 1 TABLET BY MOUTH EVERY DAY IN THE EVENING, Disp: 90 tablet, Rfl: 0  No Known Allergies  I personally reviewed active problem list, medication list, allergies, family history, social history, health maintenance with the patient/caregiver today.   ROS  ***  Objective  There were no vitals filed for this visit.  There is no height or weight on file to calculate BMI.  Physical Exam ***  No results found for this or any previous visit (from the past 2160 hour(s)).   PHQ2/9:    04/05/2022   11:24 AM 10/16/2021   11:06 AM 08/27/2021   11:55 AM 05/25/2021   10:53 AM 05/08/2021    1:02 PM  Depression screen PHQ 2/9  Decreased Interest 0 0 0 0 0  Down, Depressed, Hopeless 0 0 2 0 0  PHQ - 2 Score 0 0 2 0 0  Altered sleeping 0  3 0 0  Tired, decreased energy 0  1 0 0  Change in appetite 0  1 0 0  Feeling bad or failure about yourself  0  0 0 0  Trouble concentrating 0  1 0 0  Moving slowly or fidgety/restless 0  1 0 0  Suicidal thoughts 0  0 0 0  PHQ-9 Score 0  9 0 0  Difficult doing work/chores   Not difficult at all      phq 9 is {gen pos [id number]}   Fall Risk:    04/05/2022   11:23 AM 10/16/2021   11:06 AM 08/27/2021   11:59 AM 05/25/2021   10:53 AM 05/08/2021    1:02 PM  Fall Risk   Falls in the past  year? 0 0 0 1 0  Number falls in past yr:  0 0 1 0  Injury with Fall?  0 0 0 0  Risk for fall due to : No Fall Risks No Fall Risks No Fall Risks Impaired balance/gait No Fall Risks  Follow up Falls prevention discussed;Education provided;Falls evaluation completed Falls prevention discussed Falls prevention discussed Falls prevention discussed Falls prevention discussed      Functional Status Survey:      Assessment & Plan  *** There are no diagnoses linked to this encounter.

## 2022-08-06 ENCOUNTER — Ambulatory Visit: Payer: 59 | Admitting: Family Medicine

## 2022-08-06 DIAGNOSIS — E1142 Type 2 diabetes mellitus with diabetic polyneuropathy: Secondary | ICD-10-CM

## 2022-08-26 ENCOUNTER — Other Ambulatory Visit: Payer: Self-pay | Admitting: Family Medicine

## 2022-08-26 DIAGNOSIS — E1142 Type 2 diabetes mellitus with diabetic polyneuropathy: Secondary | ICD-10-CM

## 2022-08-26 DIAGNOSIS — E1143 Type 2 diabetes mellitus with diabetic autonomic (poly)neuropathy: Secondary | ICD-10-CM

## 2022-08-26 DIAGNOSIS — M545 Low back pain, unspecified: Secondary | ICD-10-CM

## 2022-08-26 DIAGNOSIS — E1169 Type 2 diabetes mellitus with other specified complication: Secondary | ICD-10-CM

## 2022-08-26 DIAGNOSIS — I152 Hypertension secondary to endocrine disorders: Secondary | ICD-10-CM

## 2022-08-31 NOTE — Telephone Encounter (Signed)
Pt made appt for 09-07-2022.

## 2022-09-06 NOTE — Progress Notes (Signed)
Name: William Parks   MRN: [id number]    DOB: [birth year]   Date:09/07/2022       Progress Note  Subjective  Chief Complaint  Medication Refill  HPI  DMII: he was seen back in April 2021 and A1C was 12.4 %,  11.1 %  to 9.2 % and back in May  2022 10.5 % , up to 11.8 %  down to 11 %  after that it went down to 7.9 % last visit it was  8.1 % today is up again at 12.6 %   . He is now on dose of insulin  75/25 Ozempic 0.5 once a week  In early 2023  his family started to monitor or call during his office visits  and have been trying to get involved in monitoring his medication intake, however today he came in alone.  He has associated HTN, dyslipidemia, microalbuminuria  and neuropathy. He is taking Lyrica as prescribed for diabetic neuropathy and seems to be controlling symptoms. He cannot tolerate Metformin but is taking Actos. His son lives in [place] , daughter lives in [place] He is no longer driving, his brother brought him to the office visit, he works all day and is not sure if he is taking his insulin   HTN: he is taking losartan 100 daily but states not longer taking Metoprolol. BP is at goal . Denies chest pain or palpitation ,he moved to [place] with his brother and it is 100 miles away and will try to establish care at the same office as his brother   Dyslipidemia/Atherosclerosis of aorta : he is taking Atorvastatin daily and last LDL was at goal    Tobacco use: he quit May 4 th, 2022 with the nicotine patch. No cough or wheezing, Unchanged   [facility] : seen by neurologist, labs done, CT was ordered but not done, he is unable to have MRI brain due to claustrophobia  First visit with neurologist was 07/2021 - Dr. Sherryll Burger. Last visit was 11/2021 with his PA - Janice Coffin   Patient Active Problem List   Diagnosis Date Noted   Type 2 diabetes mellitus with peripheral neuropathy ([facility]) 10/16/2021   S/P right colectomy 11/14/2020   Atherosclerosis of aorta ([facility]) 10/03/2019   Neck and  shoulder pain 11/08/2016   Vitamin D deficiency 10/15/2015   Dyslipidemia 06/02/2015   Hypertension, benign 04/01/2015    Past Surgical History:  Procedure Laterality Date   APPENDECTOMY     CYST REMOVAL TRUNK     2x   LAPAROSCOPY N/A 09/01/2020   Procedure: LAPAROSCOPY DIAGNOSTIC;  Surgeon: Henrene Dodge, MD;  Location: [facility] ORS;  Service: General;  Laterality: N/A;   PARTIAL COLECTOMY N/A 09/01/2020   Procedure: PARTIAL RIGHT COLECTOMY;  Surgeon: Henrene Dodge, MD;  Location: [facility] ORS;  Service: General;  Laterality: N/A;   SHOULDER SURGERY Right 2009   rotator cuff surgery    Family History  Problem Relation Age of Onset   Cancer Mother        Breast CA   Diabetes Mother    Hypertension Mother    Diabetes Brother     Social History   Tobacco Use   Smoking status: Former    Packs/day: 0.25    Years: 50.00    Additional pack years: 0.00    Total pack years: 12.50    Types: Cigarettes    Start date: 07/27/1967    Quit date: 08/27/2020    Years since quitting: 2.0   Smokeless  tobacco: Never  Substance Use Topics   Alcohol use: No    Alcohol/week: 0.0 standard drinks of alcohol     Current Outpatient Medications:    aspirin 81 MG EC tablet, Take 81 mg by mouth daily., Disp: , Rfl: 5   atorvastatin (LIPITOR) 20 MG tablet, TAKE ONE TABLET BY MOUTH EVERYDAY AT BEDTIME, Disp: 90 tablet, Rfl: 1   azelastine (ASTELIN) 0.1 % nasal spray, Place 2 sprays into both nostrils 2 (two) times daily. Use in each nostril as directed, Disp: 30 mL, Rfl: 2   blood glucose meter kit and supplies, 1 each by Other route 2 (two) times daily as needed for other. Dispense based on patient and insurance preference. Use up to four times daily as directed. (FOR ICD-10 E10.9, E11.9). Uncontrolled DM, Disp: 1 each, Rfl: 0   docusate sodium (COLACE) 100 MG capsule, TAKE ONE CAPSULE BY MOUTH Every Other Day at Bedtime, Disp: 15 capsule, Rfl: 0   glucose blood (ONETOUCH ULTRA) test strip, USE TO check  blood glucose four times daily AS DIRECTED, Disp: 100 strip, Rfl: 0   Insulin Lispro Prot & Lispro (HUMALOG 75/25 MIX) (75-25) 100 UNIT/ML Kwikpen, Inject 30 units into THE SKIN BEFORE breakfast AND 34 units BEFORE SUPPER, Disp: 21 mL, Rfl: 2   Insulin Pen Needle (COMFORT EZ PEN NEEDLES) 31G X 6 MM MISC, USE TO inject insulin three times daily AS DIRECTED, Disp: 100 each, Rfl: 2   Lancets (ONETOUCH DELICA PLUS LANCET33G) MISC, USE TO check blood glucose four times daily AS DIRECTED, Disp: 100 each, Rfl: 0   losartan (COZAAR) 100 MG tablet, TAKE ONE TABLET BY MOUTH EVERYDAY AT BEDTIME, Disp: 90 tablet, Rfl: 1   pioglitazone (ACTOS) 15 MG tablet, Take 1 tablet (15 mg total) by mouth daily., Disp: 90 tablet, Rfl: 1   pregabalin (LYRICA) 150 MG capsule, TAKE ONE CAPSULE BY MOUTH EVERYDAY AT BEDTIME, Disp: 90 capsule, Rfl: 0   Semaglutide, 1 MG/DOSE, (OZEMPIC, 1 MG/DOSE,) 4 MG/3ML SOPN, Inject 1mg  into the skin once weekly, Disp: 3 mL, Rfl: 0   tiZANidine (ZANAFLEX) 2 MG tablet, TAKE 1 TABLET BY MOUTH EVERY DAY IN THE EVENING, Disp: 90 tablet, Rfl: 0  No Known Allergies  I personally reviewed active problem list, medication list, allergies, family history, social history, health maintenance with the patient/caregiver today.   ROS  Ten systems reviewed and is negative except as mentioned in HPI   Objective  Vitals:   09/07/22 1414  BP: 126/68  Pulse: 91  Resp: 16  SpO2: 94%  Weight: 188 lb (85.3 kg)  Height: 5\' 8"  (1.727 m)    Body mass index is 28.59 kg/m.  Physical Exam  Constitutional: Patient appears well-developed and well-nourished.  No distress.  HEENT: head atraumatic, normocephalic, pupils equal and reactive to light, neck supple Cardiovascular: Normal rate, regular rhythm and normal heart sounds.  No murmur heard. No BLE edema. Pulmonary/Chest: Effort normal and breath sounds normal. No respiratory distress. Abdominal: Soft.  There is no tenderness. Psychiatric: he was  frustrated, wanting to leave and eat  Recent Results (from the past 2160 hour(s))  POCT HgB A1C     Status: Abnormal   Collection Time: 09/07/22  2:19 PM  Result Value Ref Range   Hemoglobin A1C 12.6 (A) 4.0 - 5.6 %   HbA1c POC (<> result, manual entry)     HbA1c, POC (prediabetic range)     HbA1c, POC (controlled diabetic range)       PHQ2/9:  09/07/2022    2:15 PM 04/05/2022   11:24 AM 10/16/2021   11:06 AM 08/27/2021   11:55 AM 05/25/2021   10:53 AM  Depression screen PHQ 2/9  Decreased Interest 0 0 0 0 0  Down, Depressed, Hopeless 0 0 0 2 0  PHQ - 2 Score 0 0 0 2 0  Altered sleeping 0 0  3 0  Tired, decreased energy 0 0  1 0  Change in appetite 3 0  1 0  Feeling bad or failure about yourself  0 0  0 0  Trouble concentrating 0 0  1 0  Moving slowly or fidgety/restless 0 0  1 0  Suicidal thoughts 0 0  0 0  PHQ-9 Score 3 0  9 0  Difficult doing work/chores    Not difficult at all     phq 9 is negative   Fall Risk:    09/07/2022    2:15 PM 04/05/2022   11:23 AM 10/16/2021   11:06 AM 08/27/2021   11:59 AM 05/25/2021   10:53 AM  Fall Risk   Falls in the past year? 0 0 0 0 1  Number falls in past yr: 0  0 0 1  Injury with Fall? 0  0 0 0  Risk for fall due to : No Fall Risks No Fall Risks No Fall Risks No Fall Risks Impaired balance/gait  Follow up Falls prevention discussed Falls prevention discussed;Education provided;Falls evaluation completed Falls prevention discussed Falls prevention discussed Falls prevention discussed      Functional Status Survey: Is the patient deaf or have difficulty hearing?: No Does the patient have difficulty seeing, even when wearing glasses/contacts?: No Does the patient have difficulty concentrating, remembering, or making decisions?: No Does the patient have difficulty walking or climbing stairs?: No Does the patient have difficulty dressing or bathing?: No Does the patient have difficulty doing errands alone such as visiting a  doctor's office or shopping?: No    Assessment & Plan  1. Type 2 diabetes mellitus with peripheral neuropathy (HCC)  - POCT HgB A1C  2. Hyperlipidemia associated with type 2 diabetes mellitus ([facility])  - pioglitazone (ACTOS) 15 MG tablet; Take 1 tablet (15 mg total) by mouth daily.  Dispense: 90 tablet; Refill: 1  3. Hypertension associated with type 2 diabetes mellitus ([facility])  - pioglitazone (ACTOS) 15 MG tablet; Take 1 tablet (15 mg total) by mouth daily.  Dispense: 90 tablet; Refill: 1  4. Intermittent low back pain  - pregabalin (LYRICA) 150 MG capsule; Take 1 capsule (150 mg total) by mouth at bedtime.  Dispense: 90 capsule; Refill: 0 - tiZANidine (ZANAFLEX) 2 MG tablet; TAKE 1 TABLET BY MOUTH EVERY DAY IN THE EVENING  Dispense: 90 tablet; Refill: 1  5. Dyslipidemia  - atorvastatin (LIPITOR) 20 MG tablet; TAKE ONE TABLET BY MOUTH EVERYDAY AT BEDTIME  Dispense: 90 tablet; Refill: 1  6. Essential hypertension  - losartan (COZAAR) 100 MG tablet; TAKE ONE TABLET BY MOUTH EVERYDAY AT BEDTIME  Dispense: 90 tablet; Refill: 1

## 2022-09-07 ENCOUNTER — Encounter: Payer: Self-pay | Admitting: Family Medicine

## 2022-09-07 ENCOUNTER — Ambulatory Visit (INDEPENDENT_AMBULATORY_CARE_PROVIDER_SITE_OTHER): Payer: 59 | Admitting: Family Medicine

## 2022-09-07 VITALS — BP 126/68 | HR 91 | Resp 16 | Ht 68.0 in | Wt 188.0 lb

## 2022-09-07 DIAGNOSIS — E1169 Type 2 diabetes mellitus with other specified complication: Secondary | ICD-10-CM | POA: Diagnosis not present

## 2022-09-07 DIAGNOSIS — E785 Hyperlipidemia, unspecified: Secondary | ICD-10-CM

## 2022-09-07 DIAGNOSIS — M545 Low back pain, unspecified: Secondary | ICD-10-CM | POA: Diagnosis not present

## 2022-09-07 DIAGNOSIS — E1159 Type 2 diabetes mellitus with other circulatory complications: Secondary | ICD-10-CM

## 2022-09-07 DIAGNOSIS — Z794 Long term (current) use of insulin: Secondary | ICD-10-CM | POA: Diagnosis not present

## 2022-09-07 DIAGNOSIS — I152 Hypertension secondary to endocrine disorders: Secondary | ICD-10-CM

## 2022-09-07 DIAGNOSIS — Z7985 Long-term (current) use of injectable non-insulin antidiabetic drugs: Secondary | ICD-10-CM | POA: Diagnosis not present

## 2022-09-07 DIAGNOSIS — E1142 Type 2 diabetes mellitus with diabetic polyneuropathy: Secondary | ICD-10-CM | POA: Diagnosis not present

## 2022-09-07 DIAGNOSIS — I10 Essential (primary) hypertension: Secondary | ICD-10-CM

## 2022-09-07 LAB — POCT GLYCOSYLATED HEMOGLOBIN (HGB A1C): Hemoglobin A1C: 12.6 % — AB (ref 4.0–5.6)

## 2022-09-07 MED ORDER — SEMAGLUTIDE (2 MG/DOSE) 8 MG/3ML ~~LOC~~ SOPN: 2.0000 mg | PEN_INJECTOR | SUBCUTANEOUS | 1 refills | Status: AC

## 2022-09-07 MED ORDER — TIZANIDINE HCL 2 MG PO TABS
ORAL_TABLET | ORAL | 1 refills | Status: AC
Start: 2022-09-07 — End: ?

## 2022-09-07 MED ORDER — ATORVASTATIN CALCIUM 20 MG PO TABS
ORAL_TABLET | ORAL | 1 refills | Status: AC
Start: 2022-09-07 — End: ?

## 2022-09-07 MED ORDER — PREGABALIN 150 MG PO CAPS
150.0000 mg | ORAL_CAPSULE | Freq: Every evening | ORAL | 0 refills | Status: AC
Start: 2022-09-07 — End: ?

## 2022-09-07 MED ORDER — PIOGLITAZONE HCL 15 MG PO TABS
15.0000 mg | ORAL_TABLET | Freq: Every day | ORAL | 1 refills | Status: AC
Start: 2022-09-07 — End: ?

## 2022-09-07 MED ORDER — LOSARTAN POTASSIUM 100 MG PO TABS
ORAL_TABLET | ORAL | 1 refills | Status: AC
Start: 2022-09-07 — End: ?

## 2022-09-16 ENCOUNTER — Ambulatory Visit (INDEPENDENT_AMBULATORY_CARE_PROVIDER_SITE_OTHER): Payer: 59

## 2022-09-16 VITALS — Ht 68.5 in | Wt 188.0 lb

## 2022-09-16 DIAGNOSIS — Z0000 Encounter for general adult medical examination without abnormal findings: Secondary | ICD-10-CM | POA: Diagnosis not present

## 2022-09-16 NOTE — Patient Instructions (Signed)
William Parks , Thank you for taking time to come for your Medicare Wellness Visit. I appreciate your ongoing commitment to your health goals. Please review the following plan we discussed and let me know if I can assist you in the future.   These are the goals we discussed:  Goals       "i want to lower my HgA1C" (pt-stated)      During face to face interview, patient states she has not been performing independent self health promoting activities for diabetes management. She is requesting guidance and information about self management with goal of lowering HgA1C by 2 points over the next 3 months.    Clinical Goal(s): Over the next 30 days, patient will:  Monitor and document cbg daily as evidenced by review of cbg documentation tool Take all prescribed medications daily as evidenced by patient report and clinician review of adherence packs Utilize plate method as carbohydrate portion control tool, as evidenced by patient report  Interventions: Face to face diabetes education, utilizing teach back method, including recommendations and rationale for daily cbg monitoring/recording, carb modified dietary counseling, medication review and counseling; Emmi educational materials provided to patient       CCM Expected Outcome:  Monitor, Self-Manage and Reduce Symptoms of:      DIET - INCREASE WATER INTAKE      Recommend to drink at least 6-8 8 oz glasses of water per day       Have 3 meals a day      Pt would like to increase meals to 3 times per day      Monitor and Manage My Blood Sugar-Diabetes Type 2      Timeframe:  Long-Range Goal Priority:  High Start Date: 07/22/21                            Expected End Date: 07/23/22                      Follow Up within 30 days   - check blood sugar at prescribed times - check blood sugar if I feel it is too high or too low - enter blood sugar readings and medication or insulin into daily log - take the blood sugar log to all doctor visits     Why is this important?   Checking your blood sugar at home helps to keep it from getting very high or very low.  Writing the results in a diary or log helps the doctor know how to care for you.  Your blood sugar log should have the time, date and the results.  Also, write down the amount of insulin or other medicine that you take.  Other information, like what you ate, exercise done and how you were feeling, will also be helpful.     Notes:         This is a list of the screening recommended for you and due dates:  Health Maintenance  Topic Date Due   DTaP/Tdap/Td vaccine (1 - Tdap) Never done   Eye exam for diabetics  01/22/2021   COVID-19 Vaccine (4 - 2023-24 season) 12/25/2021   Zoster (Shingles) Vaccine (2 of 2) 11/04/2022*   Complete foot exam   10/17/2022   Flu Shot  11/25/2022   Hemoglobin A1C  03/10/2023   Yearly kidney function blood test for diabetes  04/06/2023   Yearly kidney health urinalysis for diabetes  04/06/2023   Medicare Annual Wellness Visit  09/16/2023   Pneumonia Vaccine  Completed   Hepatitis C Screening: USPSTF Recommendation to screen - Ages 18-79 yo.  Completed   HPV Vaccine  Aged Out  *Topic was postponed. The date shown is not the original due date.    Advanced directives: no  Conditions/risks identified: low falls risk  Next appointment: Follow up in one year for your annual wellness visit. Pt declines to schedule says not returnin or remaining with [facility]. Pt moved to [facility].  Preventive Care 42 Years and Older, Male  Preventive care refers to lifestyle choices and visits with your health care provider that can promote health and wellness. What does preventive care include? A yearly physical exam. This is also called an annual well check. Dental exams once or twice a year. Routine eye exams. Ask your health care provider how often you should have your eyes checked. Personal lifestyle choices, including: Daily care of your teeth and  gums. Regular physical activity. Eating a healthy diet. Avoiding tobacco and drug use. Limiting alcohol use. Practicing safe sex. Taking low doses of aspirin every day. Taking vitamin and mineral supplements as recommended by your health care provider. What happens during an annual well check? The services and screenings done by your health care provider during your annual well check will depend on your age, overall health, lifestyle risk factors, and family history of disease. Counseling  Your health care provider may ask you questions about your: Alcohol use. Tobacco use. Drug use. Emotional well-being. Home and relationship well-being. Sexual activity. Eating habits. History of falls. Memory and ability to understand (cognition). Work and work Astronomer. Screening  You may have the following tests or measurements: Height, weight, and BMI. Blood pressure. Lipid and cholesterol levels. These may be checked every 5 years, or more frequently if you are over 18 years old. Skin check. Lung cancer screening. You may have this screening every year starting at age 109 if you have a 30-pack-year history of smoking and currently smoke or have quit within the past 15 years. Fecal occult blood test (FOBT) of the stool. You may have this test every year starting at age 71. Flexible sigmoidoscopy or colonoscopy. You may have a sigmoidoscopy every 5 years or a colonoscopy every 10 years starting at age 38. Prostate cancer screening. Recommendations will vary depending on your family history and other risks. Hepatitis C blood test. Hepatitis B blood test. Sexually transmitted disease (STD) testing. Diabetes screening. This is done by checking your blood sugar (glucose) after you have not eaten for a while (fasting). You may have this done every 1-3 years. Abdominal aortic aneurysm (AAA) screening. You may need this if you are a current or former smoker. Osteoporosis. You may be screened  starting at age 68 if you are at high risk. Talk with your health care provider about your test results, treatment options, and if necessary, the need for more tests. Vaccines  Your health care provider may recommend certain vaccines, such as: Influenza vaccine. This is recommended every year. Tetanus, diphtheria, and acellular pertussis (Tdap, Td) vaccine. You may need a Td booster every 10 years. Zoster vaccine. You may need this after age 42. Pneumococcal 13-valent conjugate (PCV13) vaccine. One dose is recommended after age 79. Pneumococcal polysaccharide (PPSV23) vaccine. One dose is recommended after age 23. Talk to your health care provider about which screenings and vaccines you need and how often you need them. This information is not intended to  replace advice given to you by your health care provider. Make sure you discuss any questions you have with your health care provider. Document Released: 05/09/2015 Document Revised: 12/31/2015 Document Reviewed: 02/11/2015 Elsevier Interactive Patient Education  2017 [employer].  Fall Prevention in the Home Falls can cause injuries. They can happen to people of all ages. There are many things you can do to make your home safe and to help prevent falls. What can I do on the outside of my home? Regularly fix the edges of walkways and driveways and fix any cracks. Remove anything that might make you trip as you walk through a door, such as a raised step or threshold. Trim any bushes or trees on the path to your home. Use bright outdoor lighting. Clear any walking paths of anything that might make someone trip, such as rocks or tools. Regularly check to see if handrails are loose or broken. Make sure that both sides of any steps have handrails. Any raised decks and porches should have guardrails on the edges. Have any leaves, snow, or ice cleared regularly. Use sand or salt on walking paths during winter. Clean up any spills in your garage  right away. This includes oil or grease spills. What can I do in the bathroom? Use night lights. Install grab bars by the toilet and in the tub and shower. Do not use towel bars as grab bars. Use non-skid mats or decals in the tub or shower. If you need to sit down in the shower, use a plastic, non-slip stool. Keep the floor dry. Clean up any water that spills on the floor as soon as it happens. Remove soap buildup in the tub or shower regularly. Attach bath mats securely with double-sided non-slip rug tape. Do not have throw rugs and other things on the floor that can make you trip. What can I do in the bedroom? Use night lights. Make sure that you have a light by your bed that is easy to reach. Do not use any sheets or blankets that are too big for your bed. They should not hang down onto the floor. Have a firm chair that has side arms. You can use this for support while you get dressed. Do not have throw rugs and other things on the floor that can make you trip. What can I do in the kitchen? Clean up any spills right away. Avoid walking on wet floors. Keep items that you use a lot in easy-to-reach places. If you need to reach something above you, use a strong step stool that has a grab bar. Keep electrical cords out of the way. Do not use floor polish or wax that makes floors slippery. If you must use wax, use non-skid floor wax. Do not have throw rugs and other things on the floor that can make you trip. What can I do with my stairs? Do not leave any items on the stairs. Make sure that there are handrails on both sides of the stairs and use them. Fix handrails that are broken or loose. Make sure that handrails are as long as the stairways. Check any carpeting to make sure that it is firmly attached to the stairs. Fix any carpet that is loose or worn. Avoid having throw rugs at the top or bottom of the stairs. If you do have throw rugs, attach them to the floor with carpet tape. Make  sure that you have a light switch at the top of the stairs and the  bottom of the stairs. If you do not have them, ask someone to add them for you. What else can I do to help prevent falls? Wear shoes that: Do not have high heels. Have rubber bottoms. Are comfortable and fit you well. Are closed at the toe. Do not wear sandals. If you use a stepladder: Make sure that it is fully opened. Do not climb a closed stepladder. Make sure that both sides of the stepladder are locked into place. Ask someone to hold it for you, if possible. Clearly mark and make sure that you can see: Any grab bars or handrails. First and last steps. Where the edge of each step is. Use tools that help you move around (mobility aids) if they are needed. These include: Canes. Walkers. Scooters. Crutches. Turn on the lights when you go into a dark area. Replace any light bulbs as soon as they burn out. Set up your furniture so you have a clear path. Avoid moving your furniture around. If any of your floors are uneven, fix them. If there are any pets around you, be aware of where they are. Review your medicines with your doctor. Some medicines can make you feel dizzy. This can increase your chance of falling. Ask your doctor what other things that you can do to help prevent falls. This information is not intended to replace advice given to you by your health care provider. Make sure you discuss any questions you have with your health care provider. Document Released: 02/06/2009 Document Revised: 09/18/2015 Document Reviewed: 05/17/2014 Elsevier Interactive Patient Education  2017 [employer].

## 2022-09-16 NOTE — Progress Notes (Signed)
I connected with  William Parks on 09/16/22 by a audio enabled telemedicine application and verified that I am speaking with the correct person using two identifiers.  Patient Location: Home  Provider Location: Home Office  I discussed the limitations of evaluation and management by telemedicine. The patient expressed understanding and agreed to proceed.  Subjective:   William Parks is a 77 y.o. male who presents for Medicare Annual/Subsequent preventive examination.  Review of Systems    Cardiac Risk Factors include: advanced age (>46men, >68 women);diabetes mellitus;dyslipidemia;hypertension;male gender    Objective:    Today's Vitals   09/16/22 1153  Weight: 188 lb (85.3 kg)  Height: 5' 8.5" (1.74 m)   Body mass index is 28.17 kg/m.     09/16/2022   12:04 PM 08/27/2021   11:58 AM 11/08/2020    2:07 PM 09/12/2020    4:15 PM 08/27/2020   12:34 AM 08/26/2020    3:31 PM 08/05/2020    2:42 PM  Advanced Directives  Does Patient Have a Medical Advance Directive? No No No No  No No  Would patient like information on creating a medical advance directive?  No - Patient declined  No - Patient declined No - Guardian declined  No - Patient declined    Current Medications (verified) Outpatient Encounter Medications as of 09/16/2022  Medication Sig   aspirin 81 MG EC tablet Take 81 mg by mouth daily.   atorvastatin (LIPITOR) 20 MG tablet TAKE ONE TABLET BY MOUTH EVERYDAY AT BEDTIME   azelastine (ASTELIN) 0.1 % nasal spray Place 2 sprays into both nostrils 2 (two) times daily. Use in each nostril as directed   blood glucose meter kit and supplies 1 each by Other route 2 (two) times daily as needed for other. Dispense based on patient and insurance preference. Use up to four times daily as directed. (FOR ICD-10 E10.9, E11.9). Uncontrolled DM   docusate sodium (COLACE) 100 MG capsule TAKE ONE CAPSULE BY MOUTH Every Other Day at Bedtime   glucose blood (ONETOUCH ULTRA) test strip USE TO check  blood glucose four times daily AS DIRECTED   Insulin Lispro Prot & Lispro (HUMALOG 75/25 MIX) (75-25) 100 UNIT/ML Kwikpen Inject 30 units into THE SKIN BEFORE breakfast AND 34 units BEFORE SUPPER   Insulin Pen Needle (COMFORT EZ PEN NEEDLES) 31G X 6 MM MISC USE TO inject insulin three times daily AS DIRECTED   Lancets (ONETOUCH DELICA PLUS LANCET33G) MISC USE TO check blood glucose four times daily AS DIRECTED   losartan (COZAAR) 100 MG tablet TAKE ONE TABLET BY MOUTH EVERYDAY AT BEDTIME   pioglitazone (ACTOS) 15 MG tablet Take 1 tablet (15 mg total) by mouth daily.   pregabalin (LYRICA) 150 MG capsule Take 1 capsule (150 mg total) by mouth at bedtime.   Semaglutide, 2 MG/DOSE, 8 MG/3ML SOPN Inject 2 mg as directed once a week.   tiZANidine (ZANAFLEX) 2 MG tablet TAKE 1 TABLET BY MOUTH EVERY DAY IN THE EVENING   No facility-administered encounter medications on file as of 09/16/2022.    Allergies (verified) Patient has no known allergies.   History: Past Medical History:  Diagnosis Date   Arthritis    rt shoulder   Diabetes mellitus without complication (HCC)    Hyperlipidemia    Hyperlipidemia associated with type 2 diabetes mellitus (HCC) 06/02/2015   Hypertension    Past Surgical History:  Procedure Laterality Date   APPENDECTOMY     CYST REMOVAL TRUNK     2x  LAPAROSCOPY N/A 09/01/2020   Procedure: LAPAROSCOPY DIAGNOSTIC;  Surgeon: Henrene Dodge, MD;  Location: [facility] ORS;  Service: General;  Laterality: N/A;   PARTIAL COLECTOMY N/A 09/01/2020   Procedure: PARTIAL RIGHT COLECTOMY;  Surgeon: Henrene Dodge, MD;  Location: [facility] ORS;  Service: General;  Laterality: N/A;   SHOULDER SURGERY Right 2009   rotator cuff surgery   Family History  Problem Relation Age of Onset   Cancer Mother        Breast CA   Diabetes Mother    Hypertension Mother    Diabetes Brother    Social History   Socioeconomic History   Marital status: Divorced    Spouse name: Not on file   Number of  children: 3   Years of education: Not on file   Highest education level: 10th grade  Occupational History   Occupation: Retired  Tobacco Use   Smoking status: Former    Packs/day: 0.25    Years: 50.00    Additional pack years: 0.00    Total pack years: 12.50    Types: Cigarettes    Start date: 07/27/1967    Quit date: 08/27/2020    Years since quitting: 2.0   Smokeless tobacco: Never  Vaping Use   Vaping Use: Never used  Substance and Sexual Activity   Alcohol use: No    Alcohol/week: 0.0 standard drinks of alcohol   Drug use: No   Sexual activity: Yes    Partners: Female  Other Topics Concern   Not on file  Social History Narrative   Lives with girlfriend - William Parks for the past 20 years; passed away Sep 12, 2021  Social Determinants of Health   Financial Resource Strain: Low Risk  (09/16/2022)   Overall Financial Resource Strain (CARDIA)    Difficulty of Paying Living Expenses: Not hard at all  Food Insecurity: No Food Insecurity (09/16/2022)   Hunger Vital Sign    Worried About Running Out of Food in the Last Year: Never true    Ran Out of Food in the Last Year: Never true  Transportation Needs: No Transportation Needs (09/16/2022)   PRAPARE - Administrator, Civil Service (Medical): No    Lack of Transportation (Non-Medical): No  Physical Activity: Sufficiently Active (09/16/2022)   Exercise Vital Sign    Days of Exercise per Week: 7 days    Minutes of Exercise per Session: 30 min  Stress: No Stress Concern Present (09/16/2022)   [employer] of Occupational Health - Occupational Stress Questionnaire    Feeling of Stress : Not at all  Social Connections: Moderately Isolated (08/27/2021)   Social Connection and Isolation Panel [NHANES]    Frequency of Communication with Friends and Family: More than three times a week    Frequency of Social Gatherings with Friends and Family: Three times a week    Attends Religious Services: More than 4 times per year     Active Member of Clubs or Organizations: No    Attends Banker Meetings: Never    Marital Status: Divorced    Tobacco Counseling Counseling given: Not Answered   Clinical Intake:  Pre-visit preparation completed: Yes  Pain : No/denies pain   BMI - recorded: 28.17 Nutritional Status: BMI 25 -29 Overweight Nutritional Risks: None Diabetes: Yes CBG done?: Yes (303 this am at home) CBG resulted in Enter/ Edit results?: No Did pt. bring in CBG monitor from home?: No  How often do you need to have someone  help you when you read instructions, pamphlets, or other written materials from your doctor or pharmacy?: 1 - Never  Diabetic?yes  Interpreter Needed?: No  Comments: lives with brother Information entered by :: B.Anaia Frith,LPN   Activities of Daily Living    09/16/2022   12:04 PM 09/07/2022    2:15 PM  In your present state of health, do you have any difficulty performing the following activities:  Hearing? 0 0  Vision? 0 0  Difficulty concentrating or making decisions? 0 0  Walking or climbing stairs? 0 0  Dressing or bathing? 0 0  Doing errands, shopping? 0 0  Preparing Food and eating ? N   Using the Toilet? N   In the past six months, have you accidently leaked urine? N   Do you have problems with loss of bowel control? N   Managing your Medications? N   Managing your Finances? N   Housekeeping or managing your Housekeeping? N     Patient Care Team: Alba Cory, MD as PCP - General (Family Medicine) Gaspar Cola, [facility] (Pharmacist)  Indicate any recent Medical Services you may have received from other than [facility] providers in the past year (date may be approximate).     Assessment:   This is a routine wellness examination for [name].  Hearing/Vision screen Hearing Screening - Comments:: Adequate hearing mostly Vision Screening - Comments:: Adequate vision  Dietary issues and exercise activities discussed: Current Exercise Habits:  Home exercise routine, Type of exercise: walking, Time (Minutes): 30, Frequency (Times/Week): 7, Weekly Exercise (Minutes/Week): 210, Intensity: Mild, Exercise limited by: orthopedic condition(s)   Goals Addressed               This Visit's Progress     "i want to lower my HgA1C" (pt-stated)   Not on track     During face to face interview, patient states she has not been performing independent self health promoting activities for diabetes management. She is requesting guidance and information about self management with goal of lowering HgA1C by 2 points over the next 3 months.    Clinical Goal(s): Over the next 30 days, patient will:  Monitor and document cbg daily as evidenced by review of cbg documentation tool Take all prescribed medications daily as evidenced by patient report and clinician review of adherence packs Utilize plate method as carbohydrate portion control tool, as evidenced by patient report  Interventions: Face to face diabetes education, utilizing teach back method, including recommendations and rationale for daily cbg monitoring/recording, carb modified dietary counseling, medication review and counseling; Emmi educational materials provided to patient        Depression Screen    09/16/2022   12:01 PM 09/07/2022    2:15 PM 04/05/2022   11:24 AM 10/16/2021   11:06 AM 08/27/2021   11:55 AM 05/25/2021   10:53 AM 05/08/2021    1:02 PM  PHQ 2/9 Scores  PHQ - 2 Score 0 0 0 0 2 0 0  PHQ- 9 Score  3 0  9 0 0    Fall Risk    09/16/2022   11:59 AM 09/07/2022    2:15 PM 04/05/2022   11:23 AM 10/16/2021   11:06 AM 08/27/2021   11:59 AM  Fall Risk   Falls in the past year? 1 0 0 0 0  Number falls in past yr: 1 0  0 0  Injury with Fall? 0 0  0 0  Risk for fall due to : No Fall Risks  No Fall Risks No Fall Risks No Fall Risks No Fall Risks  Follow up Education provided;Falls prevention discussed Falls prevention discussed Falls prevention discussed;Education provided;Falls  evaluation completed Falls prevention discussed Falls prevention discussed    FALL RISK PREVENTION PERTAINING TO THE HOME:  Any stairs in or around the home? Yes  If so, are there any without handrails? Yes  Home free of loose throw rugs in walkways, pet beds, electrical cords, etc? Yes  Adequate lighting in your home to reduce risk of falls? Yes   ASSISTIVE DEVICES UTILIZED TO PREVENT FALLS:  Life alert? No  Use of a cane, walker or w/c? Yes cane does not use Grab bars in the bathroom? No  Shower chair or bench in shower? Yes  Elevated toilet seat or a handicapped toilet? No   Cognitive Function:    05/08/2021    1:04 PM  MMSE - Mini Mental State Exam  Orientation to time 5  Orientation to Place 5  Registration 3  Attention/ Calculation 2  Recall 0  Language- name 2 objects 2  Language- repeat 0  Language- follow 3 step command 3  Language- read & follow direction 1  Write a sentence 1  Copy design 0  Total score 22        09/16/2022   12:07 PM 05/04/2018    9:31 AM 05/03/2017    9:25 AM 05/31/2016   10:27 AM  6CIT Screen  What Year? 0 points 0 points 0 points 0 points  What month? 0 points 0 points 0 points 0 points  What time? 0 points 0 points 0 points 0 points  Count back from 20 0 points 0 points 0 points 0 points  Months in reverse 0 points 0 points 0 points 0 points  Repeat phrase 6 points 4 points 8 points 2 points  Total Score 6 points 4 points 8 points 2 points    Immunizations Immunization History  Administered Date(s) Administered   Fluad Quad(high Dose 65+) 02/06/2019, 03/05/2020, 02/18/2021, 04/05/2022   Influenza, High Dose Seasonal PF 04/01/2015, 01/25/2017, 02/03/2018   Influenza,inj,Quad PF,6+ Mos 01/07/2016   PFIZER(Purple Top)SARS-COV-2 Vaccination 01/09/2020, 01/30/2020   Pfizer Covid-19 Vaccine Bivalent Booster 32yrs & up 10/16/2021   Pneumococcal Conjugate-13 04/21/2016   Pneumococcal Polysaccharide-23 04/27/2017   Zoster Recombinat  (Shingrix) 05/12/2018    TDAP status: Up to date  Flu Vaccine status: Up to date  Pneumococcal vaccine status: Up to date  Covid-19 vaccine status: Completed vaccines  Qualifies for Shingles Vaccine? Yes   Zostavax completed Yes   Shingrix Completed?: Yes  Screening Tests Health Maintenance  Topic Date Due   DTaP/Tdap/Td (1 - Tdap) Never done   OPHTHALMOLOGY EXAM  01/22/2021   COVID-19 Vaccine (4 - 2023-24 season) 12/25/2021   Zoster Vaccines- Shingrix (2 of 2) 11/04/2022 (Originally 07/07/2018)   FOOT EXAM  10/17/2022   INFLUENZA VACCINE  11/25/2022   HEMOGLOBIN A1C  03/10/2023   Diabetic kidney evaluation - eGFR measurement  04/06/2023   Diabetic kidney evaluation - Urine ACR  04/06/2023   Medicare Annual Wellness (AWV)  09/16/2023   Pneumonia Vaccine 81+ Years old  Completed   Hepatitis C Screening  Completed   HPV VACCINES  Aged Out    Health Maintenance  Health Maintenance Due  Topic Date Due   DTaP/Tdap/Td (1 - Tdap) Never done   OPHTHALMOLOGY EXAM  01/22/2021   COVID-19 Vaccine (4 - 2023-24 season) 12/25/2021    Colorectal cancer screening: No longer required.  Lung Cancer Screening: (Low Dose CT Chest recommended if Age 71-80 years, 30 pack-year currently smoking OR have quit w/in 15years.) does not qualify.   Lung Cancer Screening Referral: no pt declines moved from area  Additional Screening:  Hepatitis C Screening: does not qualify; Completed yes  Vision Screening: Recommended annual ophthalmology exams for early detection of glaucoma and other disorders of the eye. Is the patient up to date with their annual eye exam?  Yes  Who is the provider or what is the name of the office in which the patient attends annual eye exams? [facility] If pt is not established with a provider, would they like to be referred to a provider to establish care? No .   Dental Screening: Recommended annual dental exams for proper oral hygiene  Community Resource  Referral / Chronic Care Management: CRR required this visit?  No   CCM required this visit?  No    Plan:     I have personally reviewed and noted the following in the patient's chart:   Medical and social history Use of alcohol, tobacco or illicit drugs  Current medications and supplements including opioid prescriptions. Patient is not currently taking opioid prescriptions. Functional ability and status Nutritional status Physical activity Advanced directives List of other physicians Hospitalizations, surgeries, and ER visits in previous 12 months Vitals Screenings to include cognitive, depression, and falls Referrals and appointments  In addition, I have reviewed and discussed with patient certain preventive protocols, quality metrics, and best practice recommendations. A written personalized care plan for preventive services as well as general preventive health recommendations were provided to patient.     Sue Lush, LPN   1/61/0960   Nurse Notes: Patient recently seen by PCP and states he is doing well and has no concerns or questions at this time. He does relay he has moved to [facility] and is looking for PC there. He states he will not be returning to [facility]

## 2022-09-28 ENCOUNTER — Other Ambulatory Visit: Payer: Self-pay | Admitting: Family Medicine

## 2022-09-28 DIAGNOSIS — I700 Atherosclerosis of aorta: Secondary | ICD-10-CM

## 2022-09-28 DIAGNOSIS — E1169 Type 2 diabetes mellitus with other specified complication: Secondary | ICD-10-CM

## 2022-09-28 DIAGNOSIS — E1165 Type 2 diabetes mellitus with hyperglycemia: Secondary | ICD-10-CM

## 2022-09-28 NOTE — Telephone Encounter (Signed)
Pt informed that prescription has been sent to pharmacy. Pt have not found a provider where he currently lives but is working on it.

## 2022-09-29 DIAGNOSIS — E119 Type 2 diabetes mellitus without complications: Secondary | ICD-10-CM | POA: Diagnosis not present

## 2022-10-21 DIAGNOSIS — E113512 Type 2 diabetes mellitus with proliferative diabetic retinopathy with macular edema, left eye: Secondary | ICD-10-CM | POA: Diagnosis not present

## 2022-10-26 ENCOUNTER — Other Ambulatory Visit: Payer: Self-pay | Admitting: Family Medicine

## 2022-10-26 DIAGNOSIS — E1165 Type 2 diabetes mellitus with hyperglycemia: Secondary | ICD-10-CM

## 2022-10-26 DIAGNOSIS — I700 Atherosclerosis of aorta: Secondary | ICD-10-CM

## 2022-10-26 DIAGNOSIS — E1169 Type 2 diabetes mellitus with other specified complication: Secondary | ICD-10-CM

## 2022-10-26 DIAGNOSIS — E1143 Type 2 diabetes mellitus with diabetic autonomic (poly)neuropathy: Secondary | ICD-10-CM

## 2022-11-23 DIAGNOSIS — E113592 Type 2 diabetes mellitus with proliferative diabetic retinopathy without macular edema, left eye: Secondary | ICD-10-CM | POA: Diagnosis not present

## 2022-11-23 DIAGNOSIS — E113391 Type 2 diabetes mellitus with moderate nonproliferative diabetic retinopathy without macular edema, right eye: Secondary | ICD-10-CM | POA: Diagnosis not present

## 2022-11-23 DIAGNOSIS — H4312 Vitreous hemorrhage, left eye: Secondary | ICD-10-CM | POA: Diagnosis not present

## 2022-11-24 ENCOUNTER — Other Ambulatory Visit: Payer: Self-pay | Admitting: Family Medicine

## 2022-11-24 DIAGNOSIS — I700 Atherosclerosis of aorta: Secondary | ICD-10-CM

## 2022-11-24 DIAGNOSIS — E1143 Type 2 diabetes mellitus with diabetic autonomic (poly)neuropathy: Secondary | ICD-10-CM

## 2022-11-24 DIAGNOSIS — E1165 Type 2 diabetes mellitus with hyperglycemia: Secondary | ICD-10-CM

## 2022-11-24 DIAGNOSIS — E1169 Type 2 diabetes mellitus with other specified complication: Secondary | ICD-10-CM

## 2022-12-22 ENCOUNTER — Other Ambulatory Visit: Payer: Self-pay | Admitting: Family Medicine

## 2022-12-22 DIAGNOSIS — M545 Low back pain, unspecified: Secondary | ICD-10-CM

## 2022-12-31 DIAGNOSIS — E139 Other specified diabetes mellitus without complications: Secondary | ICD-10-CM | POA: Diagnosis not present

## 2022-12-31 DIAGNOSIS — Z13228 Encounter for screening for other metabolic disorders: Secondary | ICD-10-CM | POA: Diagnosis not present

## 2022-12-31 DIAGNOSIS — Z1329 Encounter for screening for other suspected endocrine disorder: Secondary | ICD-10-CM | POA: Diagnosis not present

## 2022-12-31 DIAGNOSIS — Z130 Encounter for screening for diseases of the blood and blood-forming organs and certain disorders involving the immune mechanism: Secondary | ICD-10-CM | POA: Diagnosis not present

## 2022-12-31 DIAGNOSIS — E785 Hyperlipidemia, unspecified: Secondary | ICD-10-CM | POA: Diagnosis not present

## 2023-01-11 DIAGNOSIS — H353131 Nonexudative age-related macular degeneration, bilateral, early dry stage: Secondary | ICD-10-CM | POA: Diagnosis not present

## 2023-01-11 DIAGNOSIS — E113592 Type 2 diabetes mellitus with proliferative diabetic retinopathy without macular edema, left eye: Secondary | ICD-10-CM | POA: Diagnosis not present

## 2023-01-11 DIAGNOSIS — E113391 Type 2 diabetes mellitus with moderate nonproliferative diabetic retinopathy without macular edema, right eye: Secondary | ICD-10-CM | POA: Diagnosis not present

## 2023-01-17 DIAGNOSIS — E139 Other specified diabetes mellitus without complications: Secondary | ICD-10-CM | POA: Diagnosis not present

## 2023-01-17 DIAGNOSIS — E785 Hyperlipidemia, unspecified: Secondary | ICD-10-CM | POA: Diagnosis not present

## 2023-01-17 DIAGNOSIS — Z13228 Encounter for screening for other metabolic disorders: Secondary | ICD-10-CM | POA: Diagnosis not present

## 2023-01-17 DIAGNOSIS — Z130 Encounter for screening for diseases of the blood and blood-forming organs and certain disorders involving the immune mechanism: Secondary | ICD-10-CM | POA: Diagnosis not present

## 2023-01-17 DIAGNOSIS — Z1329 Encounter for screening for other suspected endocrine disorder: Secondary | ICD-10-CM | POA: Diagnosis not present

## 2023-02-28 ENCOUNTER — Other Ambulatory Visit: Payer: Self-pay | Admitting: Family Medicine

## 2023-02-28 DIAGNOSIS — E1159 Type 2 diabetes mellitus with other circulatory complications: Secondary | ICD-10-CM

## 2023-02-28 DIAGNOSIS — I10 Essential (primary) hypertension: Secondary | ICD-10-CM

## 2023-02-28 DIAGNOSIS — M545 Low back pain, unspecified: Secondary | ICD-10-CM

## 2023-02-28 DIAGNOSIS — E785 Hyperlipidemia, unspecified: Secondary | ICD-10-CM

## 2023-03-23 LAB — MICROALBUMIN / CREATININE URINE RATIO: Microalb Creat Ratio: 44

## 2023-06-09 ENCOUNTER — Telehealth: Payer: Self-pay | Admitting: Family Medicine

## 2023-06-09 NOTE — Telephone Encounter (Signed)
I called pt no answer left vm, to verify if he is considered a patient with our clinic or if he has transferred somewhere else. I advised to call us back.

## 2023-06-09 NOTE — Telephone Encounter (Signed)
Michelle with [facility] is calling in because pt had some abnormal lab results and they wanted to know if the office would like to see pt based on the results. The labs were done back in November of 2024. If there are questions, Marcelino Duster can be reached at- [phone]

## 2023-06-09 NOTE — Telephone Encounter (Signed)
Spoke with Antoinette and informed her that he is not a pt here. She verbalized understanding and will inform Marcelino Duster
